# Patient Record
Sex: Female | Born: 1940
Health system: Southern US, Community
[De-identification: ages and names within clinical notes are randomized; demographics above are authoritative.]

## PROBLEM LIST (undated history)

## (undated) ENCOUNTER — Ambulatory Visit (HOSPITAL_BASED_OUTPATIENT_CLINIC_OR_DEPARTMENT_OTHER)

## (undated) DIAGNOSIS — E559 Vitamin D deficiency, unspecified: Secondary | ICD-10-CM

## (undated) DIAGNOSIS — G43909 Migraine, unspecified, not intractable, without status migrainosus: Secondary | ICD-10-CM

## (undated) DIAGNOSIS — I2699 Other pulmonary embolism without acute cor pulmonale: Secondary | ICD-10-CM

## (undated) DIAGNOSIS — I10 Essential (primary) hypertension: Secondary | ICD-10-CM

## (undated) DIAGNOSIS — D125 Benign neoplasm of sigmoid colon: Secondary | ICD-10-CM

## (undated) DIAGNOSIS — R0600 Dyspnea, unspecified: Secondary | ICD-10-CM

## (undated) DIAGNOSIS — R42 Dizziness and giddiness: Secondary | ICD-10-CM

## (undated) DIAGNOSIS — Z8711 Personal history of peptic ulcer disease: Secondary | ICD-10-CM

## (undated) DIAGNOSIS — H9190 Unspecified hearing loss, unspecified ear: Secondary | ICD-10-CM

## (undated) DIAGNOSIS — I499 Cardiac arrhythmia, unspecified: Secondary | ICD-10-CM

## (undated) DIAGNOSIS — R7303 Prediabetes: Secondary | ICD-10-CM

## (undated) DIAGNOSIS — M1990 Unspecified osteoarthritis, unspecified site: Secondary | ICD-10-CM

## (undated) DIAGNOSIS — D649 Anemia, unspecified: Secondary | ICD-10-CM

## (undated) DIAGNOSIS — J45909 Unspecified asthma, uncomplicated: Secondary | ICD-10-CM

## (undated) DIAGNOSIS — K2100 Gastro-esophageal reflux disease with esophagitis, without bleeding: Secondary | ICD-10-CM

## (undated) DIAGNOSIS — E782 Mixed hyperlipidemia: Secondary | ICD-10-CM

## (undated) DIAGNOSIS — E785 Hyperlipidemia, unspecified: Secondary | ICD-10-CM

## (undated) DIAGNOSIS — K219 Gastro-esophageal reflux disease without esophagitis: Secondary | ICD-10-CM

## (undated) DIAGNOSIS — I491 Atrial premature depolarization: Secondary | ICD-10-CM

## (undated) DIAGNOSIS — G4733 Obstructive sleep apnea (adult) (pediatric): Secondary | ICD-10-CM

## (undated) DIAGNOSIS — Z973 Presence of spectacles and contact lenses: Secondary | ICD-10-CM

## (undated) DIAGNOSIS — I2510 Atherosclerotic heart disease of native coronary artery without angina pectoris: Secondary | ICD-10-CM

## (undated) DIAGNOSIS — E119 Type 2 diabetes mellitus without complications: Secondary | ICD-10-CM

## (undated) DIAGNOSIS — R55 Syncope and collapse: Secondary | ICD-10-CM

## (undated) DIAGNOSIS — R21 Rash and other nonspecific skin eruption: Secondary | ICD-10-CM

## (undated) DIAGNOSIS — I219 Acute myocardial infarction, unspecified: Secondary | ICD-10-CM

## (undated) DIAGNOSIS — Z955 Presence of coronary angioplasty implant and graft: Secondary | ICD-10-CM

## (undated) DIAGNOSIS — I739 Peripheral vascular disease, unspecified: Secondary | ICD-10-CM

## (undated) DIAGNOSIS — B029 Zoster without complications: Secondary | ICD-10-CM

## (undated) HISTORY — DX: Prediabetes: R73.03

## (undated) HISTORY — DX: Mixed hyperlipidemia: E78.2

## (undated) HISTORY — DX: Dizziness and giddiness: R42

## (undated) HISTORY — DX: Benign neoplasm of sigmoid colon: D12.5

## (undated) HISTORY — DX: Vitamin D deficiency, unspecified: E55.9

## (undated) HISTORY — DX: Essential (primary) hypertension: I10

## (undated) HISTORY — DX: Migraine, unspecified, not intractable, without status migrainosus: G43.909

## (undated) HISTORY — PX: TUBAL LIGATION: SHX77

## (undated) HISTORY — DX: Gastro-esophageal reflux disease with esophagitis, without bleeding: K21.00

## (undated) HISTORY — DX: Hyperlipidemia, unspecified: E78.5

## (undated) HISTORY — PX: CEREBRAL ANEURYSM REPAIR: SHX164

## (undated) HISTORY — DX: Unspecified osteoarthritis, unspecified site: M19.90

## (undated) HISTORY — DX: Unspecified asthma, uncomplicated: J45.909

## (undated) HISTORY — DX: Presence of coronary angioplasty implant and graft: Z95.5

## (undated) HISTORY — PX: HX HYSTERECTOMY: SHX81

## (undated) HISTORY — DX: Atrial premature depolarization: I49.1

## (undated) HISTORY — PX: HX TONSILLECTOMY: SHX27

## (undated) HISTORY — DX: Atherosclerotic heart disease of native coronary artery without angina pectoris: I25.10

## (undated) HISTORY — DX: Obstructive sleep apnea (adult) (pediatric): G47.33

## (undated) HISTORY — PX: HAND SURGERY: SHX662

## (undated) HISTORY — DX: Peripheral vascular disease, unspecified: I73.9

## (undated) HISTORY — DX: Syncope and collapse: R55

## (undated) HISTORY — PX: SHOULDER SURGERY: SHX246

## (undated) HISTORY — PX: HX CORONARY STENT PLACEMENT: SHX49

## (undated) HISTORY — DX: Gastro-esophageal reflux disease without esophagitis: K21.9

---

## 1991-03-17 HISTORY — PX: FOOT SURGERY: SHX648

## 1998-05-16 ENCOUNTER — Ambulatory Visit: Admission: RE | Admit: 1998-05-16 | Discharge: 1998-05-16 | Payer: Self-pay | Admitting: Internal Medicine

## 1998-05-30 ENCOUNTER — Ambulatory Visit (HOSPITAL_COMMUNITY): Admission: RE | Admit: 1998-05-30 | Discharge: 1998-05-30 | Payer: Self-pay | Admitting: Cardiology

## 1999-04-25 ENCOUNTER — Encounter: Admission: RE | Admit: 1999-04-25 | Discharge: 1999-04-25 | Payer: Self-pay | Admitting: Otolaryngology

## 1999-04-25 ENCOUNTER — Encounter: Payer: Self-pay | Admitting: Otolaryngology

## 1999-11-14 ENCOUNTER — Encounter: Admission: RE | Admit: 1999-11-14 | Discharge: 1999-11-14 | Payer: Self-pay | Admitting: *Deleted

## 1999-11-14 ENCOUNTER — Other Ambulatory Visit: Admission: RE | Admit: 1999-11-14 | Discharge: 1999-11-14 | Payer: Self-pay | Admitting: *Deleted

## 1999-11-14 ENCOUNTER — Encounter: Payer: Self-pay | Admitting: *Deleted

## 2000-12-09 ENCOUNTER — Encounter: Payer: Self-pay | Admitting: *Deleted

## 2000-12-09 ENCOUNTER — Encounter: Admission: RE | Admit: 2000-12-09 | Discharge: 2000-12-09 | Payer: Self-pay | Admitting: *Deleted

## 2000-12-09 ENCOUNTER — Other Ambulatory Visit: Admission: RE | Admit: 2000-12-09 | Discharge: 2000-12-09 | Payer: Self-pay | Admitting: *Deleted

## 2001-12-29 ENCOUNTER — Encounter: Payer: Self-pay | Admitting: Internal Medicine

## 2001-12-29 ENCOUNTER — Encounter: Admission: RE | Admit: 2001-12-29 | Discharge: 2001-12-29 | Payer: Self-pay | Admitting: Internal Medicine

## 2001-12-29 ENCOUNTER — Other Ambulatory Visit: Admission: RE | Admit: 2001-12-29 | Discharge: 2001-12-29 | Payer: Self-pay | Admitting: Internal Medicine

## 2003-04-23 ENCOUNTER — Other Ambulatory Visit: Admission: RE | Admit: 2003-04-23 | Discharge: 2003-04-23 | Payer: Self-pay | Admitting: Internal Medicine

## 2003-05-11 ENCOUNTER — Ambulatory Visit (HOSPITAL_COMMUNITY): Admission: RE | Admit: 2003-05-11 | Discharge: 2003-05-11 | Payer: Self-pay | Admitting: Internal Medicine

## 2004-05-09 ENCOUNTER — Ambulatory Visit (HOSPITAL_COMMUNITY): Admission: RE | Admit: 2004-05-09 | Discharge: 2004-05-09 | Payer: Self-pay | Admitting: Internal Medicine

## 2005-05-19 ENCOUNTER — Other Ambulatory Visit: Admission: RE | Admit: 2005-05-19 | Discharge: 2005-05-19 | Payer: Self-pay | Admitting: Internal Medicine

## 2005-05-19 ENCOUNTER — Ambulatory Visit (HOSPITAL_COMMUNITY): Admission: RE | Admit: 2005-05-19 | Discharge: 2005-05-19 | Payer: Self-pay | Admitting: Internal Medicine

## 2006-05-26 ENCOUNTER — Ambulatory Visit (HOSPITAL_COMMUNITY): Admission: RE | Admit: 2006-05-26 | Discharge: 2006-05-26 | Payer: Self-pay | Admitting: Internal Medicine

## 2007-03-17 HISTORY — PX: CATARACT EXTRACTION W/ INTRAOCULAR LENS  IMPLANT, BILATERAL: SHX1307

## 2007-06-13 ENCOUNTER — Ambulatory Visit (HOSPITAL_COMMUNITY): Admission: RE | Admit: 2007-06-13 | Discharge: 2007-06-13 | Payer: Self-pay | Admitting: Internal Medicine

## 2007-06-13 ENCOUNTER — Other Ambulatory Visit: Admission: RE | Admit: 2007-06-13 | Discharge: 2007-06-13 | Payer: Self-pay | Admitting: Internal Medicine

## 2007-06-13 LAB — HM PAP SMEAR: HM PAP: NORMAL

## 2007-06-24 ENCOUNTER — Encounter: Admission: RE | Admit: 2007-06-24 | Discharge: 2007-06-24 | Payer: Self-pay | Admitting: Internal Medicine

## 2008-06-27 ENCOUNTER — Ambulatory Visit (HOSPITAL_COMMUNITY): Admission: RE | Admit: 2008-06-27 | Discharge: 2008-06-27 | Payer: Self-pay | Admitting: Internal Medicine

## 2008-07-04 ENCOUNTER — Encounter: Admission: RE | Admit: 2008-07-04 | Discharge: 2008-07-04 | Payer: Self-pay | Admitting: Internal Medicine

## 2009-07-08 ENCOUNTER — Ambulatory Visit (HOSPITAL_COMMUNITY): Admission: RE | Admit: 2009-07-08 | Discharge: 2009-07-08 | Payer: Self-pay | Admitting: Internal Medicine

## 2009-07-17 ENCOUNTER — Ambulatory Visit (HOSPITAL_COMMUNITY): Admission: RE | Admit: 2009-07-17 | Discharge: 2009-07-17 | Payer: Self-pay | Admitting: Internal Medicine

## 2009-07-17 LAB — HM DEXA SCAN: HM Dexa Scan: NORMAL

## 2010-04-06 ENCOUNTER — Encounter: Payer: Self-pay | Admitting: Internal Medicine

## 2010-06-05 ENCOUNTER — Other Ambulatory Visit (HOSPITAL_COMMUNITY): Payer: Self-pay | Admitting: Internal Medicine

## 2010-06-05 DIAGNOSIS — Z1231 Encounter for screening mammogram for malignant neoplasm of breast: Secondary | ICD-10-CM

## 2010-07-16 ENCOUNTER — Ambulatory Visit (HOSPITAL_COMMUNITY)
Admission: RE | Admit: 2010-07-16 | Discharge: 2010-07-16 | Disposition: A | Discharge status: Home / Self Care | Payer: MEDICARE | Source: Ambulatory Visit | Attending: Internal Medicine | Admitting: Internal Medicine

## 2010-07-16 DIAGNOSIS — Z1231 Encounter for screening mammogram for malignant neoplasm of breast: Secondary | ICD-10-CM | POA: Insufficient documentation

## 2010-08-15 ENCOUNTER — Emergency Department (HOSPITAL_COMMUNITY): Payer: Self-pay | Admitting: Physician Assistant

## 2010-09-29 ENCOUNTER — Ambulatory Visit
Admission: RE | Admit: 2010-09-29 | Discharge: 2010-09-29 | Disposition: A | Discharge status: Home / Self Care | Payer: Medicare Other | Source: Ambulatory Visit | Attending: Dermatology | Admitting: Dermatology

## 2010-09-29 ENCOUNTER — Other Ambulatory Visit: Payer: Self-pay | Admitting: Dermatology

## 2010-09-29 DIAGNOSIS — T63301A Toxic effect of unspecified spider venom, accidental (unintentional), initial encounter: Secondary | ICD-10-CM

## 2010-09-29 DIAGNOSIS — T6391XA Toxic effect of contact with unspecified venomous animal, accidental (unintentional), initial encounter: Secondary | ICD-10-CM

## 2011-07-24 ENCOUNTER — Other Ambulatory Visit (HOSPITAL_COMMUNITY): Payer: Self-pay | Admitting: Internal Medicine

## 2011-07-24 DIAGNOSIS — Z1231 Encounter for screening mammogram for malignant neoplasm of breast: Secondary | ICD-10-CM

## 2011-08-19 ENCOUNTER — Ambulatory Visit (HOSPITAL_COMMUNITY): Payer: Medicare Other

## 2011-09-10 ENCOUNTER — Ambulatory Visit (HOSPITAL_COMMUNITY)
Admission: RE | Admit: 2011-09-10 | Discharge: 2011-09-10 | Disposition: A | Discharge status: Home / Self Care | Payer: Medicare Other | Source: Ambulatory Visit | Attending: Internal Medicine | Admitting: Internal Medicine

## 2011-09-10 DIAGNOSIS — Z1231 Encounter for screening mammogram for malignant neoplasm of breast: Secondary | ICD-10-CM

## 2011-12-15 DIAGNOSIS — I6529 Occlusion and stenosis of unspecified carotid artery: Secondary | ICD-10-CM

## 2011-12-29 ENCOUNTER — Encounter: Payer: Self-pay | Admitting: Internal Medicine

## 2012-01-27 LAB — HM COLONOSCOPY

## 2012-07-11 ENCOUNTER — Encounter: Payer: Self-pay | Admitting: Internal Medicine

## 2012-09-14 ENCOUNTER — Other Ambulatory Visit (HOSPITAL_COMMUNITY): Payer: Self-pay | Admitting: Internal Medicine

## 2012-09-14 DIAGNOSIS — Z1231 Encounter for screening mammogram for malignant neoplasm of breast: Secondary | ICD-10-CM

## 2012-09-15 ENCOUNTER — Ambulatory Visit (HOSPITAL_COMMUNITY)
Admission: RE | Admit: 2012-09-15 | Discharge: 2012-09-15 | Disposition: A | Discharge status: Home / Self Care | Payer: Medicare Other | Source: Ambulatory Visit | Attending: Internal Medicine | Admitting: Internal Medicine

## 2012-09-15 DIAGNOSIS — Z1231 Encounter for screening mammogram for malignant neoplasm of breast: Secondary | ICD-10-CM

## 2012-09-15 LAB — HM MAMMOGRAPHY: HM MAMMO: NORMAL

## 2013-03-20 DIAGNOSIS — R7309 Other abnormal glucose: Secondary | ICD-10-CM | POA: Insufficient documentation

## 2013-03-20 DIAGNOSIS — J45909 Unspecified asthma, uncomplicated: Secondary | ICD-10-CM | POA: Insufficient documentation

## 2013-03-20 DIAGNOSIS — G43909 Migraine, unspecified, not intractable, without status migrainosus: Secondary | ICD-10-CM | POA: Insufficient documentation

## 2013-03-20 DIAGNOSIS — I10 Essential (primary) hypertension: Secondary | ICD-10-CM | POA: Insufficient documentation

## 2013-03-20 DIAGNOSIS — E559 Vitamin D deficiency, unspecified: Secondary | ICD-10-CM | POA: Insufficient documentation

## 2013-03-21 ENCOUNTER — Encounter: Payer: Self-pay | Admitting: Physician Assistant

## 2013-03-21 ENCOUNTER — Ambulatory Visit (INDEPENDENT_AMBULATORY_CARE_PROVIDER_SITE_OTHER): Payer: Medicare HMO | Admitting: Physician Assistant

## 2013-03-21 VITALS — BP 138/62 | HR 80 | Temp 98.1°F | Resp 16 | Ht 64.0 in | Wt 185.0 lb

## 2013-03-21 DIAGNOSIS — E785 Hyperlipidemia, unspecified: Secondary | ICD-10-CM

## 2013-03-21 DIAGNOSIS — E782 Mixed hyperlipidemia: Secondary | ICD-10-CM | POA: Insufficient documentation

## 2013-03-21 DIAGNOSIS — R7309 Other abnormal glucose: Secondary | ICD-10-CM

## 2013-03-21 DIAGNOSIS — Z79899 Other long term (current) drug therapy: Secondary | ICD-10-CM

## 2013-03-21 DIAGNOSIS — E559 Vitamin D deficiency, unspecified: Secondary | ICD-10-CM

## 2013-03-21 DIAGNOSIS — I10 Essential (primary) hypertension: Secondary | ICD-10-CM

## 2013-03-21 DIAGNOSIS — R7303 Prediabetes: Secondary | ICD-10-CM

## 2013-03-21 LAB — CBC WITH DIFFERENTIAL/PLATELET
BASOS ABS: 0 10*3/uL (ref 0.0–0.1)
BASOS PCT: 0 % (ref 0–1)
EOS PCT: 1 % (ref 0–5)
Eosinophils Absolute: 0 10*3/uL (ref 0.0–0.7)
HEMATOCRIT: 43.1 % (ref 36.0–46.0)
Hemoglobin: 14.6 g/dL (ref 12.0–15.0)
LYMPHS ABS: 1.3 10*3/uL (ref 0.7–4.0)
Lymphocytes Relative: 26 % (ref 12–46)
MCH: 30.6 pg (ref 26.0–34.0)
MCHC: 33.9 g/dL (ref 30.0–36.0)
MCV: 90.4 fL (ref 78.0–100.0)
MONO ABS: 0.3 10*3/uL (ref 0.1–1.0)
Monocytes Relative: 7 % (ref 3–12)
NEUTROS ABS: 3.2 10*3/uL (ref 1.7–7.7)
Neutrophils Relative %: 66 % (ref 43–77)
Platelets: 199 10*3/uL (ref 150–400)
RBC: 4.77 MIL/uL (ref 3.87–5.11)
RDW: 14.6 % (ref 11.5–15.5)
WBC: 4.9 10*3/uL (ref 4.0–10.5)

## 2013-03-21 LAB — MAGNESIUM: MAGNESIUM: 2.1 mg/dL (ref 1.5–2.5)

## 2013-03-21 LAB — HEPATIC FUNCTION PANEL
ALBUMIN: 4.2 g/dL (ref 3.5–5.2)
ALK PHOS: 93 U/L (ref 39–117)
ALT: 12 U/L (ref 0–35)
AST: 16 U/L (ref 0–37)
Bilirubin, Direct: 0.1 mg/dL (ref 0.0–0.3)
Indirect Bilirubin: 0.3 mg/dL (ref 0.0–0.9)
TOTAL PROTEIN: 6.2 g/dL (ref 6.0–8.3)
Total Bilirubin: 0.4 mg/dL (ref 0.3–1.2)

## 2013-03-21 LAB — BASIC METABOLIC PANEL WITH GFR
BUN: 17 mg/dL (ref 6–23)
CHLORIDE: 107 meq/L (ref 96–112)
CO2: 30 meq/L (ref 19–32)
Calcium: 9.1 mg/dL (ref 8.4–10.5)
Creat: 0.68 mg/dL (ref 0.50–1.10)
GFR, Est Non African American: 89 mL/min
GLUCOSE: 114 mg/dL — AB (ref 70–99)
POTASSIUM: 4.4 meq/L (ref 3.5–5.3)
Sodium: 144 mEq/L (ref 135–145)

## 2013-03-21 LAB — LIPID PANEL
Cholesterol: 135 mg/dL (ref 0–200)
HDL: 42 mg/dL (ref >39)
LDL CALC: 72 mg/dL (ref 0–99)
TRIGLYCERIDES: 104 mg/dL (ref <150)
Total CHOL/HDL Ratio: 3.2 Ratio
VLDL: 21 mg/dL (ref 0–40)

## 2013-03-21 LAB — HEMOGLOBIN A1C
Hgb A1c MFr Bld: 5.4 % (ref <5.7)
Mean Plasma Glucose: 108 mg/dL (ref <117)

## 2013-03-21 NOTE — Progress Notes (Signed)
HPI Patient presents for 3 month follow up with hypertension, hyperlipidemia, prediabetes and vitamin D. Patient's blood pressure has been controlled at home, today their BP is BP: 138/62 mmHg  Patient denies chest pain, shortness of breath, dizziness. She is off HCTZ.  Patient's cholesterol is diet controlled. The cholesterol last visit was LDL 81, trigs 105, HDL 47.  The patient has been working on diet and exercise for prediabetes, and denies changes in vision, polys, and paresthesias. A1C 5.6.  Last GFR was 59 with BUN 18 and Cr 0.98.  Patient is on Vitamin D supplement.  Vitamin D 37 and she restarted 8000 IU day but has been out for one week.  Patient states she does not celebrate holidays since she is by herself now with only her dogs. 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  She has had cold symptoms for 2 weeks but she is feeling better.  Current Medications:  Current Outpatient Prescriptions on File Prior to Visit  Medication Sig Dispense Refill  . Cholecalciferol (VITAMIN D PO) Take 4,000 mg by mouth daily.      . hydrochlorothiazide (HYDRODIURIL) 25 MG tablet Take 25 mg by mouth daily. As needed      . Misc Natural Products (LUTEIN 20) CAPS Take 20 mg by mouth daily.      . Omega-3 Fatty Acids (FISH OIL PO) Take by mouth daily.       No current facility-administered medications on file prior to visit.   Medical History:  Past Medical History  Diagnosis Date  . Prediabetes   . Vitamin D deficiency   . Arthritis   . Asthma   . Hypertension   . Vertigo   . Migraines   . ASCVD (arteriosclerotic cardiovascular disease)    Allergies: Allergies no known allergies  ROS Constitutional: Denies fever, chills, headaches, insomnia, fatigue, night sweats Eyes: Denies redness, blurred vision, diplopia, discharge, itchy, watery eyes.  ENT:  + post nasal drip, congestion sore throat, earache, dental pain, Tinnitus, Vertigo, Sinus pain, snoring.   Cardio: Denies chest pain, palpitations, irregular heartbeat, dyspnea, diaphoresis, orthopnea, PND, claudication, edema Respiratory: + cough denies shortness of breath, wheezing.  Gastrointestinal: Denies dysphagia, heartburn, AB pain/ cramps, N/V, diarrhea, constipation, hematemesis, melena, hematochezia,  hemorrhoids Genitourinary: Denies dysuria, frequency, urgency, nocturia, hesitancy, discharge, hematuria, flank pain Musculoskeletal: Occ. Right leg pain at night only has improved. Denies myalgia, stiffness, pain, swelling and strain/sprain. Skin: Denies pruritis, rash, changing in skin lesion Neuro: Denies Weakness, tremor, incoordination, spasms, pain Psychiatric: Denies confusion, memory loss, sensory loss Endocrine: Denies change in weight, skin, hair change, nocturia Diabetic Polys, Denies visual blurring, hyper /hypo glycemic episodes, and paresthesia, Heme/Lymph: Denies Excessive bleeding, bruising, enlarged lymph nodes  Family history- Review and unchanged Social history- Review and unchanged Physical Exam: Filed Vitals:   03/21/13 1110  BP: 138/62  Pulse: 80  Temp: 98.1 F (36.7 C)  Resp: 16   Filed Weights   03/21/13 1110  Weight: 185 lb (83.915 kg)   General Appearance: Well nourished, in no apparent distress. Eyes: PERRLA, EOMs, conjunctiva no swelling or erythema Sinuses: No Frontal/maxillary tenderness ENT/Mouth: Ext aud canals clear, TMs without erythema, bulging. No erythema, swelling, or exudate on post pharynx.  Tonsils not swollen or erythematous. Hearing normal.  Neck: Supple, thyroid normal.  Respiratory: Respiratory effort normal, BS equal bilaterally without rales, rhonchi, wheezing or stridor.  Cardio: RRR with no MRGs. Brisk peripheral pulses without edema.  Abdomen: Soft, +  BS.  Non tender, no guarding, rebound, hernias, masses. Lymphatics: Non tender without lymphadenopathy.  Musculoskeletal: Full ROM, 5/5 strength, normal gait.  Skin: Warm, dry  without rashes, lesions, ecchymosis.  Neuro: Cranial nerves intact. Normal muscle tone, no cerebellar symptoms. Sensation intact.  Psych: Awake and oriented X 3, normal affect, Insight and Judgment appropriate.   Assessment and Plan:  Hypertension: Continue medication, monitor blood pressure at home.  Continue DASH diet. Cholesterol: Continue diet and exercise. Check cholesterol.  Pre-diabetes-Continue diet and exercise. Check A1C Vitamin D Def- check level and continue medications.   Continue diet and meds as discussed. Further disposition pending results of labs.  Vicie Mutters 11:20 AM

## 2013-03-21 NOTE — Patient Instructions (Signed)
Bad carbs also include fruit juice, alcohol, and sweet tea. These are empty calories that do not signal to your brain that you are full.   Please remember the good carbs are still carbs which convert into sugar. So please measure them out no more than 1/2-1 cup of rice, oatmeal, pasta, and beans.  Veggies are however free foods! Pile them on.   I like lean protein at every meal such as chicken, [place], pork chops, cottage cheese, etc. Just do not fry these meats and please center your meal around vegetable, the meats should be a side dish.   No all fruit is created equal. Please see the list below, the fruit at the bottom is higher in sugars than the fruit at the top   The majority of colds are caused by viruses and do not require antibiotics. Please read the rest of this hand out to learn more about the common cold and what you can do to help yourself as well as help prevent the over use of antibiotics.   COMMON COLD SIGNS AND SYMPTOMS - The common cold usually causes nasal congestion, runny nose, and sneezing. A sore throat may be present on the first day but usually resolves quickly. If a cough occurs, it generally develops on about the fourth or fifth day of symptoms, typically when congestion and runny nose are resolving  COMMON COLD COMPLICATIONS - In most cases, colds do not cause serious illness or complications. Most colds last for three to seven days, although many people continue to have symptoms (coughing, sneezing, congestion) for up to two weeks.  One of the more common complications is sinusitis, which is usually caused by viruses and rarely (about 2 percent of the time) by bacteria. Having thick or yellow to green-colored nasal discharge does not mean that bacterial sinusitis has developed; discolored nasal discharge is a normal phase of the common cold.  Lower respiratory infections, such as pneumonia or bronchitis, may develop following a cold.  Infection of the middle ear, or  otitis media, can accompany or follow a cold.  COMMON COLD TREATMENT - There is no specific treatment for the viruses that cause the common cold. Most treatments are aimed at relieving some of the symptoms of the cold, but do not shorten or cure the cold. Antibiotics are not useful for treating the common cold; antibiotics are only used to treat illnesses caused by bacteria, not viruses. Unnecessary use of antibiotics for the treatment of the common cold can cause allergic reactions, diarrhea, or other gastrointestinal symptoms in some patients.  The symptoms of a cold will resolve over time, even without any treatment. People with underlying medical conditions and those who use other over-the-counter or prescription medications should speak with their healthcare provider or pharmacist to ensure that it is safe to use these treatments. The following are treatments that may reduce the symptoms caused by the common cold.  Nasal congestion - Decongestants are good for nasal congestion- if you feel very stuffy but no mucus is coming out, this is the medication that will help you the most.  Pseudoephedrine is a decongestant that can improve nasal congestion. Although a prescription is not required, drugstores in the [place] keep pseudoephedrine behind the counter, so it must be requested from a pharmacist. If you have a heart condition or high blood pressure please use Coricidin BPH instead.   Runny nose - Antihistamines such as diphenhydramine (Benadryl), certazine (Zyrtec) which are best taking at night because they  can make you tired OR loratadine (Claritin),  fexafinadine (Allegra) help with a runny nose.   Nasal sprays such an oxymetazoline (Afrin and others) may also give temporary relief of nasal congestion. However, these sprays should never be used for more than two to three days; use for more than three days use can worsen congestion.  Nasocort is now over the counter and can help decrease a  runny nose. Please stop the medication if you have blurry vision or nose bleeds.   Sore throat and headache - Sore throat and headache are best treated with a mild pain reliever such as acetaminophen (Tylenol) or a non-steroidal anti-inflammatory agent such as ibuprofen or naproxen (Motrin or Aleve). These medications should be taken with food to prevent stomach problems. As well as gargling with warm water and salt.   Cough - Common cough medicine ingredients include guaifenesin and dextromethorphan; these are often combined with other medications in over-the-counter cold formulas. Often a cough is worse at night or first in the morning due to post nasal drip from you nose. You can try to sleep at an angle to decrease a cough.   Alternative treatments - Heated, humidified air can improve symptoms of nasal congestion and runny nose, and causes few to no side effects. A number of alternative products, including vitamin C, doubling up on your vitamin D and herbal products such as echinacea, may help. Certain products, such as nasal gels that contain zinc (eg, Zicam), have been associated with a permanent loss of smell.  Antibiotics - Antibiotics should not be used to treat an uncomplicated common cold. As noted above, colds are caused by viruses. Antibiotics treat bacterial, not viral infections. Some viruses that cause the common cold can also depress the immune system or cause swelling in the lining of the nose or airways; this can, in turn, lead to a bacterial infection. Often you need to give your body 7 days to fight off a common cold while treating the symptoms with the medications listed above. If after 7 days your symptoms are not improving, you are getting worse, you have shortness of breath, chest pain, a fever of over 103 you should seek medical help immediately.   PREVENTION IS THE BEST MEDICINE - Hand washing is an essential and highly effective way to prevent the spread of infection.    Alcohol-based hand rubs are a good alternative for disinfecting hands if a sink is not available.  Hands should be washed before preparing food and eating and after coughing, blowing the nose, or sneezing. While it is not always possible to limit contact with people who may be infected with a cold, touching the eyes, nose, or mouth after direct contact should be avoided when possible. Sneezing/coughing into the sleeve of one's clothing (at the inner elbow) is another means of containing sprays of saliva and secretions and does not contaminate the hands.

## 2013-03-22 LAB — TSH: TSH: 2.599 u[IU]/mL (ref 0.350–4.500)

## 2013-04-10 ENCOUNTER — Other Ambulatory Visit: Payer: Self-pay | Admitting: Physician Assistant

## 2013-04-10 MED ORDER — DIAZEPAM 5 MG PO TABS: 5.0000 mg | ORAL_TABLET | Freq: Three times a day (TID) | ORAL | Status: DC

## 2013-09-26 ENCOUNTER — Encounter: Payer: Self-pay | Admitting: Internal Medicine

## 2013-11-18 DIAGNOSIS — Z79899 Other long term (current) drug therapy: Secondary | ICD-10-CM

## 2013-11-18 HISTORY — DX: Other long term (current) drug therapy: Z79.899

## 2013-11-18 NOTE — Progress Notes (Signed)
Patient ID: Lori [name], female   DOB: [birth year], 73 y.o.   MRN: [id number]   R E S C H E D U L E   For  I N S U R A N C E   C H A N G E

## 2013-11-21 ENCOUNTER — Other Ambulatory Visit: Payer: Self-pay | Admitting: Internal Medicine

## 2013-11-21 ENCOUNTER — Other Ambulatory Visit: Payer: Self-pay | Admitting: *Deleted

## 2013-11-21 ENCOUNTER — Encounter: Payer: PPO | Admitting: Internal Medicine

## 2013-11-21 MED ORDER — DIAZEPAM 5 MG PO TABS: ORAL_TABLET | ORAL | Status: DC

## 2014-01-24 ENCOUNTER — Encounter: Payer: Self-pay | Admitting: Internal Medicine

## 2014-01-24 ENCOUNTER — Ambulatory Visit (INDEPENDENT_AMBULATORY_CARE_PROVIDER_SITE_OTHER): Payer: Commercial Managed Care - HMO | Admitting: Internal Medicine

## 2014-01-24 VITALS — BP 130/76 | HR 76 | Temp 98.2°F | Resp 16 | Ht 64.5 in | Wt 184.4 lb

## 2014-01-24 DIAGNOSIS — R7303 Prediabetes: Secondary | ICD-10-CM

## 2014-01-24 DIAGNOSIS — R6889 Other general symptoms and signs: Secondary | ICD-10-CM

## 2014-01-24 DIAGNOSIS — Z1212 Encounter for screening for malignant neoplasm of rectum: Secondary | ICD-10-CM

## 2014-01-24 DIAGNOSIS — I10 Essential (primary) hypertension: Secondary | ICD-10-CM

## 2014-01-24 DIAGNOSIS — Z23 Encounter for immunization: Secondary | ICD-10-CM

## 2014-01-24 DIAGNOSIS — Z0001 Encounter for general adult medical examination with abnormal findings: Secondary | ICD-10-CM

## 2014-01-24 DIAGNOSIS — Z79899 Other long term (current) drug therapy: Secondary | ICD-10-CM

## 2014-01-24 DIAGNOSIS — I2510 Atherosclerotic heart disease of native coronary artery without angina pectoris: Secondary | ICD-10-CM

## 2014-01-24 DIAGNOSIS — Z9181 History of falling: Secondary | ICD-10-CM

## 2014-01-24 DIAGNOSIS — E559 Vitamin D deficiency, unspecified: Secondary | ICD-10-CM

## 2014-01-24 DIAGNOSIS — Z1331 Encounter for screening for depression: Secondary | ICD-10-CM

## 2014-01-24 DIAGNOSIS — E785 Hyperlipidemia, unspecified: Secondary | ICD-10-CM

## 2014-01-24 LAB — CBC WITH DIFFERENTIAL/PLATELET
BASOS PCT: 1 % (ref 0–1)
Basophils Absolute: 0 10*3/uL (ref 0.0–0.1)
Eosinophils Absolute: 0.1 10*3/uL (ref 0.0–0.7)
Eosinophils Relative: 2 % (ref 0–5)
HEMATOCRIT: 43.7 % (ref 36.0–46.0)
HEMOGLOBIN: 14.7 g/dL (ref 12.0–15.0)
LYMPHS ABS: 1.4 10*3/uL (ref 0.7–4.0)
Lymphocytes Relative: 33 % (ref 12–46)
MCH: 29.9 pg (ref 26.0–34.0)
MCHC: 33.6 g/dL (ref 30.0–36.0)
MCV: 89 fL (ref 78.0–100.0)
MONO ABS: 0.5 10*3/uL (ref 0.1–1.0)
MONOS PCT: 11 % (ref 3–12)
NEUTROS PCT: 53 % (ref 43–77)
Neutro Abs: 2.3 10*3/uL (ref 1.7–7.7)
Platelets: 214 10*3/uL (ref 150–400)
RBC: 4.91 MIL/uL (ref 3.87–5.11)
RDW: 15 % (ref 11.5–15.5)
WBC: 4.3 10*3/uL (ref 4.0–10.5)

## 2014-01-24 LAB — HEMOGLOBIN A1C
HEMOGLOBIN A1C: 5.8 % — AB (ref <5.7)
MEAN PLASMA GLUCOSE: 120 mg/dL — AB (ref <117)

## 2014-01-24 NOTE — Progress Notes (Signed)
Patient ID: Lori [name], female   DOB: [birth year], 73 y.o.   MRN: [id number]  MEDICARE ANNUAL WELLNESS  & PREVENTATIVE VISIT AND CPE  Assessment:   1. Encounter for general adult medical examination with abnormal findings   2. Essential hypertension  - Microalbumin / creatinine urine ratio - EKG 12-Lead - [place], RETROPERITNL ABD,  LTD - TSH  3. Hyperlipidemia  - Lipid panel  4. Prediabetes  - Hemoglobin A1c - Insulin, fasting  5. Vitamin D deficiency  - Vit D  25 hydroxy (rtn osteoporosis monitoring)  6. ASCVD (arteriosclerotic cardiovascular disease)  - EKG 12-Lead - [place], RETROPERITNL ABD,  LTD  7. Screening for malignant neoplasm of the rectum  - POC Hemoccult Bld/Stl (3-Cd Home Screen); Future  8. Medication management  - Urine Microscopic - CBC with Differential - BASIC METABOLIC PANEL WITH GFR - Hepatic function panel - Magnesium  9. At low risk for fall   10. Depression screen  - Negative  11. Need for prophylactic vaccination with tetanus-diphtheria (TD)  - DT Vaccine    Plan:   During the course of the visit the patient was educated and counseled about appropriate screening and preventive services including:    Pneumococcal vaccine   Influenza vaccine  Td vaccine  Screening electrocardiogram  Bone densitometry screening  Colorectal cancer screening  Diabetes screening  Glaucoma screening  Nutrition counseling   Advanced directives: requested  Screening recommendations, referrals: Vaccinations: DTvaccine 01/24/2014 Influenza vaccine declined Pneumococcal vaccine 10/06/11 Prevnar vaccine undecided Shingles vaccine declined Hep B vaccine not indicated  Nutrition assessed and recommended  Colonoscopy 2003 & patient refused 2013 recommended colonoscopy Recommended yearly ophthalmology/optometry visit for glaucoma screening and checkup Recommended yearly dental visit for hygiene and checkup Advanced directives - no -  recommended forms  Conditions/risks identified: BMI: Discussed weight loss, diet, and increase physical activity.  Increase physical activity: AHA recommends 150 minutes of physical activity a week.  Medications reviewed PreDiabetes is at goal, ACE/ARB therapy: No, Reason not on Ace Inhibitor/ARB therapy:  not indicated Urinary Incontinence is not an issue: discussed non pharmacology and pharmacology options.  Fall risk: low- discussed PT, home fall assessment, medications.   Subjective:   Lori [name] is a 73 y.o. female who presents for Medicare Annual Wellness Visit and complete physical.  Date of last medicare wellness visit is unknown.  She has had elevated blood pressure since 2009. Her blood pressure has been controlled at home, & today their BP is BP: 130/76 mmHg. In 1989 she had a negative Heart Cath after a FALSE POSITIVE Cardiolyte. In 2001 she had surgery at [facility] fo a cerebral aneurysm.  She does workout. She denies chest pain, shortness of breath, dizziness.  She is not on cholesterol medication and denies myalgias. Her cholesterol is at goal. The cholesterol last visit was:  Lab Results  Component Value Date   CHOL 135 03/21/2013   HDL 42 03/21/2013   LDLCALC 72 03/21/2013   TRIG 104 03/21/2013   CHOLHDL 3.2 03/21/2013    She has had Prediabetes for 2 years (2013 with A1c of 5.8%). She has been working on diet and exercise for prediabetes, and denies foot ulcerations, hyperglycemia, increased appetite, nausea, paresthesia of the feet, polydipsia, polyuria and visual disturbances. Last A1C in the office was:  Lab Results  Component Value Date   HGBA1C 5.4 03/21/2013   Patient is on Vitamin D supplement.   No results found for: VD25OH    Names of Other Physician/Practitioners you  currently use: 1. [facility] here for primary care 2. Dr Katy Fitch, eye doctor, last visit 2014 3. Dr Andree Elk, dentist, last visit 2014  Patient Care  Team: Unk Pinto, MD as PCP - General (Internal Medicine) Lafayette Dragon, MD as Consulting Physician (Gastroenterology) Clent Jacks, MD as Consulting Physician (Ophthalmology)  Medication Review: Current Outpatient Prescriptions on File Prior to Visit  Medication Sig Dispense Refill  . Ascorbic Acid (VITAMIN C PO) Take by mouth daily.    . Cholecalciferol (VITAMIN D PO) Take 4,000 mg by mouth daily.    . diazepam (VALIUM) 5 MG tablet Take 1/2 to 1 tab tid prn for anxiety. 90 tablet 0  . hydrochlorothiazide (HYDRODIURIL) 25 MG tablet Take 25 mg by mouth daily. As needed    . Misc Natural Products (LUTEIN 20) CAPS Take 20 mg by mouth daily.    . Omega-3 Fatty Acids (FISH OIL PO) Take by mouth daily.     No current facility-administered medications on file prior to visit.    Current Problems (verified) Patient Active Problem List   Diagnosis Date Noted  . Medication management 11/18/2013  . Hyperlipidemia   . Prediabetes   . Vitamin D deficiency   . Arthritis   . Asthma   . Hypertension   . Vertigo   . Migraines   . ASCVD (arteriosclerotic cardiovascular disease)     Screening Tests Health Maintenance  Topic Date Due  . COLONOSCOPY  08/28/1992  . ZOSTAVAX  08/29/2002  . TETANUS/TDAP  04/22/2013  . INFLUENZA VACCINE  10/14/2013  . MAMMOGRAM  09/16/2014  . PNEUMOCOCCAL POLYSACCHARIDE VACCINE AGE 64 AND OVER  Completed    Immunization History  Administered Date(s) Administered  . DT 01/24/2014  . Pneumococcal Polysaccharide-23 10/06/2011  . Td 04/23/2003    Preventative care: Last colonoscopy: 2003 - refused f/u in 2013  Prior vaccinations: DT : 01/24/14  Influenza: refused  Pneumococcal: 10/06/2011 Prevnar: declined Shingles/Zostavax: declined  History reviewed: allergies, current medications, past family history, past medical history, past social history, past surgical history and problem list   Risk Factors: Tobacco History  Substance Use Topics  .  Smoking status: Never Smoker   . Smokeless tobacco: Never Used  . Alcohol Use: No   She does not smoke.  Patient is not a former smoker. Are there smokers in your home (other than you)?  No  Alcohol Current alcohol use: none  Caffeine Current caffeine use: coffee 1-2 cups /day  Exercise Current exercise: walking  Nutrition/Diet Current diet: in general, a "healthy" diet    Cardiac risk factors: advanced age (older than 16 for men, 22 for women), hypertension and sedentary lifestyle.  Depression Screen (Note: if answer to either of the following is "Yes", a more complete depression screening is indicated)   Q1: Over the past two weeks, have you felt down, depressed or hopeless? No  Q2: Over the past two weeks, have you felt little interest or pleasure in doing things? No  Have you lost interest or pleasure in daily life? No  Do you often feel hopeless? No  Do you cry easily over simple problems? No  Activities of Daily Living In your present state of health, do you have any difficulty performing the following activities?:  Driving? No Managing money?  No Feeding yourself? No Getting from bed to chair? No Climbing a flight of stairs? No Preparing food and eating?: No Bathing or showering? No Getting dressed: No Getting to the toilet? No  Using the toilet:No Moving around from place to place: No In the past year have you fallen or had a near fall?:No   Are you sexually active?  No  Do you have more than one partner?  No  Vision Difficulties: No  Hearing Difficulties: No Do you often ask people to speak up or repeat themselves? No Do you experience ringing or noises in your ears? No Do you have difficulty understanding soft or whispered voices? Sometimes.  Cognition  Do you feel that you have a problem with memory?No  Do you often misplace items? No  Do you feel safe at home?  Yes  Advanced directives Does patient have a [place]? No Does  patient have a Living Will? No - offered forms   Objective:     Blood pressure 130/76, pulse 76, temperature 98.2 F (36.8 C), resp. rate 16, height 5' 4.5" (1.638 m), weight 184 lb 6.4 oz (83.643 kg). Body mass index is 31.17 kg/(m^2).  General appearance: alert, no distress, WD/WN, female Cognitive Testing  Alert? Yes  Normal Appearance?Yes  Oriented to person? Yes  Place? Yes   Time? Yes  Recall of three objects?  Yes  Can perform simple calculations? Yes  Displays appropriate judgment? Yes  Can read the correct time from a watch/clock?Yes  HEENT: normocephalic, sclerae anicteric, TMs pearly, nares patent, no discharge or erythema, pharynx normal Oral cavity: MMM, no lesions Neck: supple, no lymphadenopathy, no thyromegaly, no masses Heart: RRR, normal S1, S2, no murmurs Lungs: CTA bilaterally, no wheezes, rhonchi, or rales Abdomen: +bs, soft, non tender, non distended, no masses, no hepatomegaly, no splenomegaly Musculoskeletal: nontender, no swelling, no obvious deformity Extremities: no edema, no cyanosis, no clubbing Pulses: 2+ symmetric, upper and lower extremities, normal cap refill Neurological: alert, oriented x 3, CN2-12 intact, strength normal upper extremities and lower extremities, sensation normal throughout, DTRs 2+ throughout, no cerebellar signs, gait normal Psychiatric: normal affect, behavior normal, pleasant   Medicare Attestation I have personally reviewed: The patient's medical and social history Their use of alcohol, tobacco or illicit drugs Their current medications and supplements The patient's functional ability including ADLs,fall risks, home safety risks, cognitive, and hearing and visual impairment Diet and physical activities Evidence for depression or mood disorders  The patient's weight, height, BMI, and visual acuity have been recorded in the chart.  I have made referrals, counseling, and provided education to the patient based on review of  the above and I have provided the patient with a written personalized care plan for preventive services.    Lori Adcox DAVID, MD   01/24/2014

## 2014-01-24 NOTE — Patient Instructions (Signed)
Recommend the book "The END of DIETING" by Dr Baker Janus   and the book "The END of DIABETES " by Dr Excell Seltzer  At [email or web address] - get book & Audio CD's      Being diabetic has a  300% increased risk for heart attack, stroke, cancer, and alzheimer- type vascular dementia. It is very important that you work harder with diet by avoiding all foods that are white except chicken & fish. Avoid white rice (brown & wild rice is OK), white potatoes (sweetpotatoes in moderation is OK), White bread or wheat bread or anything made out of white flour like bagels, donuts, rolls, buns, biscuits, cakes, pastries, cookies, pizza crust, and pasta (made from white flour & egg whites) - vegetarian pasta or spinach or wheat pasta is OK. Multigrain breads like Arnold's or [employer], or multigrain sandwich thins or flatbreads.  Diet, exercise and weight loss can reverse and cure diabetes in the early stages.  Diet, exercise and weight loss is very important in the control and prevention of complications of diabetes which affects every system in your body, ie. Brain - dementia/stroke, eyes - glaucoma/blindness, heart - heart attack/heart failure, kidneys - dialysis, stomach - gastric paralysis, intestines - malabsorption, nerves - severe painful neuritis, circulation - gangrene & loss of a leg(s), and finally cancer and Alzheimers.    I recommend avoid fried & greasy foods,  sweets/candy, white rice (brown or wild rice or Quinoa is OK), white potatoes (sweet potatoes are OK) - anything made from white flour - bagels, doughnuts, rolls, buns, biscuits,white and wheat breads, pizza crust and traditional pasta made of white flour & egg white(vegetarian pasta or spinach or wheat pasta is OK).  Multi-grain bread is OK - like multi-grain flat bread or sandwich thins. Avoid alcohol in excess. Exercise is also important.    Eat all the vegetables you want - avoid meat, especially red meat and dairy - especially cheese.  Cheese  is the most concentrated form of trans-fats which is the worst thing to clog up our arteries. Veggie cheese is OK which can be found in the fresh produce section at [employer] or [employer] or [employer]  Preventive Care for Adults A healthy lifestyle and preventive care can promote health and wellness. Preventive health guidelines for women include the following key practices.  A routine yearly physical is a good way to check with your health care provider about your health and preventive screening. It is a chance to share any concerns and updates on your health and to receive a thorough exam.  Visit your dentist for a routine exam and preventive care every 6 months. Brush your teeth twice a day and floss once a day. Good oral hygiene prevents tooth decay and gum disease.  The frequency of eye exams is based on your age, health, family medical history, use of contact lenses, and other factors. Follow your health care provider's recommendations for frequency of eye exams.  Eat a healthy diet. Foods like vegetables, fruits, whole grains, low-fat dairy products, and lean protein foods contain the nutrients you need without too many calories. Decrease your intake of foods high in solid fats, added sugars, and salt. Eat the right amount of calories for you.Get information about a proper diet from your health care provider, if necessary.  Regular physical exercise is one of the most important things you can do for your health. Most adults should get at least 150 minutes of moderate-intensity exercise (any activity that increases  your heart rate and causes you to sweat) each week. In addition, most adults need muscle-strengthening exercises on 2 or more days a week.  Maintain a healthy weight. The body mass index (BMI) is a screening tool to identify possible weight problems. It provides an estimate of body fat based on height and weight. Your health care provider can find your BMI and can help you  achieve or maintain a healthy weight.For adults 20 years and older:  A BMI below 18.5 is considered underweight.  A BMI of 18.5 to 24.9 is normal.  A BMI of 25 to 29.9 is considered overweight.  A BMI of 30 and above is considered obese.  Maintain normal blood lipids and cholesterol levels by exercising and minimizing your intake of saturated fat. Eat a balanced diet with plenty of fruit and vegetables. If your lipid or cholesterol levels are high, you are over 50, or you are at high risk for heart disease, you may need your cholesterol levels checked more frequently.Ongoing high lipid and cholesterol levels should be treated with medicines if diet and exercise are not working.  If you smoke, find out from your health care provider how to quit. If you do not use tobacco, do not start.  Lung cancer screening is recommended for adults aged 55-80 years who are at high risk for developing lung cancer because of a history of smoking. A yearly low-dose CT scan of the lungs is recommended for people who have at least a 30-pack-year history of smoking and are a current smoker or have quit within the past 15 years. A pack year of smoking is smoking an average of 1 pack of cigarettes a day for 1 year (for example: 1 pack a day for 30 years or 2 packs a day for 15 years). Yearly screening should continue until the smoker has stopped smoking for at least 15 years. Yearly screening should be stopped for people who develop a health problem that would prevent them from having lung cancer treatment.  Avoid use of street drugs. Do not share needles with anyone. Ask for help if you need support or instructions about stopping the use of drugs.  High blood pressure causes heart disease and increases the risk of stroke.  Ongoing high blood pressure should be treated with medicines if weight loss and exercise do not work.  If you are 55-79 years old, ask your health care provider if you should take aspirin to  prevent strokes.  Diabetes screening involves taking a blood sample to check your fasting blood sugar level. This should be done once every 3 years, after age 45, if you are within normal weight and without risk factors for diabetes. Testing should be considered at a younger age or be carried out more frequently if you are overweight and have at least 1 risk factor for diabetes.  Breast cancer screening is essential preventive care for women. You should practice "breast self-awareness." This means understanding the normal appearance and feel of your breasts and may include breast self-examination. Any changes detected, no matter how small, should be reported to a health care provider. Women in their 20s and 30s should have a clinical breast exam (CBE) by a health care provider as part of a regular health exam every 1 to 3 years. After age 40, women should have a CBE every year. Starting at age 40, women should consider having a mammogram (breast X-ray test) every year. Women who have a family history of breast cancer should   talk to their health care provider about genetic screening. Women at a high risk of breast cancer should talk to their health care providers about having an MRI and a mammogram every year.  Breast cancer gene (BRCA)-related cancer risk assessment is recommended for women who have family members with BRCA-related cancers. BRCA-related cancers include breast, ovarian, tubal, and peritoneal cancers. Having family members with these cancers may be associated with an increased risk for harmful changes (mutations) in the breast cancer genes BRCA1 and BRCA2. Results of the assessment will determine the need for genetic counseling and BRCA1 and BRCA2 testing.  Routine pelvic exams to screen for cancer are no longer recommended for nonpregnant women who are considered low risk for cancer of the pelvic organs (ovaries, uterus, and vagina) and who do not have symptoms. Ask your health care provider  if a screening pelvic exam is right for you.  If you have had past treatment for cervical cancer or a condition that could lead to cancer, you need Pap tests and screening for cancer for at least 20 years after your treatment. If Pap tests have been discontinued, your risk factors (such as having a new sexual partner) need to be reassessed to determine if screening should be resumed. Some women have medical problems that increase the chance of getting cervical cancer. In these cases, your health care provider may recommend more frequent screening and Pap tests.    Colorectal cancer can be detected and often prevented. Most routine colorectal cancer screening begins at the age of 90 years and continues through age 8 years. However, your health care provider may recommend screening at an earlier age if you have risk factors for colon cancer. On a yearly basis, your health care provider may provide home test kits to check for hidden blood in the stool. Use of a small camera at the end of a tube, to directly examine the colon (sigmoidoscopy or colonoscopy), can detect the earliest forms of colorectal cancer. Talk to your health care provider about this at age 86, when routine screening begins. Direct exam of the colon should be repeated every 5-10 years through age 32 years, unless early forms of pre-cancerous polyps or small growths are found.  Osteoporosis is a disease in which the bones lose minerals and strength with aging. This can result in serious bone fractures or breaks. The risk of osteoporosis can be identified using a bone density scan. Women ages 75 years and over and women at risk for fractures or osteoporosis should discuss screening with their health care providers. Ask your health care provider whether you should take a calcium supplement or vitamin D to reduce the rate of osteoporosis.  Menopause can be associated with physical symptoms and risks. Hormone replacement therapy is available to  decrease symptoms and risks. You should talk to your health care provider about whether hormone replacement therapy is right for you.  Use sunscreen. Apply sunscreen liberally and repeatedly throughout the day. You should seek shade when your shadow is shorter than you. Protect yourself by wearing long sleeves, pants, a wide-brimmed hat, and sunglasses year round, whenever you are outdoors.  Once a month, do a whole body skin exam, using a mirror to look at the skin on your back. Tell your health care provider of new moles, moles that have irregular borders, moles that are larger than a pencil eraser, or moles that have changed in shape or color.  Stay current with required vaccines (immunizations).  Influenza vaccine. All adults  should be immunized every year.  Tetanus, diphtheria, and acellular pertussis (Td, Tdap) vaccine. Pregnant women should receive 1 dose of Tdap vaccine during each pregnancy. The dose should be obtained regardless of the length of time since the last dose. Immunization is preferred during the 27th-36th week of gestation. An adult who has not previously received Tdap or who does not know her vaccine status should receive 1 dose of Tdap. This initial dose should be followed by tetanus and diphtheria toxoids (Td) booster doses every 10 years. Adults with an unknown or incomplete history of completing a 3-dose immunization series with Td-containing vaccines should begin or complete a primary immunization series including a Tdap dose. Adults should receive a Td booster every 10 years.    Zoster vaccine. One dose is recommended for adults aged 44 years or older unless certain conditions are present.    Pneumococcal 13-valent conjugate (PCV13) vaccine. When indicated, a person who is uncertain of her immunization history and has no record of immunization should receive the PCV13 vaccine. An adult aged 74 years or older who has certain medical conditions and has not been previously  immunized should receive 1 dose of PCV13 vaccine. This PCV13 should be followed with a dose of pneumococcal polysaccharide (PPSV23) vaccine. The PPSV23 vaccine dose should be obtained at least 8 weeks after the dose of PCV13 vaccine. An adult aged 66 years or older who has certain medical conditions and previously received 1 or more doses of PPSV23 vaccine should receive 1 dose of PCV13. The PCV13 vaccine dose should be obtained 1 or more years after the last PPSV23 vaccine dose.    Pneumococcal polysaccharide (PPSV23) vaccine. When PCV13 is also indicated, PCV13 should be obtained first. All adults aged 89 years and older should be immunized. An adult younger than age 80 years who has certain medical conditions should be immunized. Any person who resides in a nursing home or long-term care facility should be immunized. An adult smoker should be immunized. People with an immunocompromised condition and certain other conditions should receive both PCV13 and PPSV23 vaccines. People with human immunodeficiency virus (HIV) infection should be immunized as soon as possible after diagnosis. Immunization during chemotherapy or radiation therapy should be avoided. Routine use of PPSV23 vaccine is not recommended for American Indians, [place] Natives, or people younger than 65 years unless there are medical conditions that require PPSV23 vaccine. When indicated, people who have unknown immunization and have no record of immunization should receive PPSV23 vaccine. One-time revaccination 5 years after the first dose of PPSV23 is recommended for people aged 19-64 years who have chronic kidney failure, nephrotic syndrome, asplenia, or immunocompromised conditions. People who received 1-2 doses of PPSV23 before age 34 years should receive another dose of PPSV23 vaccine at age 31 years or later if at least 5 years have passed since the previous dose. Doses of PPSV23 are not needed for people immunized with PPSV23 at or after  age 29 years.   Preventive Services / Frequency  Ages 55 years and over  Blood pressure check.  Lipid and cholesterol check.  Lung cancer screening. / Every year if you are aged 29-80 years and have a 30-pack-year history of smoking and currently smoke or have quit within the past 15 years. Yearly screening is stopped once you have quit smoking for at least 15 years or develop a health problem that would prevent you from having lung cancer treatment.  Clinical breast exam.** / Every year after age 40 years.  BRCA-related cancer risk assessment.** / For women who have family members with a BRCA-related cancer (breast, ovarian, tubal, or peritoneal cancers).  Mammogram.** / Every year beginning at age 40 years and continuing for as long as you are in good health. Consult with your health care provider.  Pap test.** / Every 3 years starting at age 30 years through age 65 or 70 years with 3 consecutive normal Pap tests. Testing can be stopped between 65 and 70 years with 3 consecutive normal Pap tests and no abnormal Pap or HPV tests in the past 10 years.  Fecal occult blood test (FOBT) of stool. / Every year beginning at age 50 years and continuing until age 75 years. You may not need to do this test if you get a colonoscopy every 10 years.  Flexible sigmoidoscopy or colonoscopy.** / Every 5 years for a flexible sigmoidoscopy or every 10 years for a colonoscopy beginning at age 50 years and continuing until age 75 years.  Hepatitis C blood test.** / For all people born from 1945 through 1965 and any individual with known risks for hepatitis C.  Osteoporosis screening.** / A one-time screening for women ages 65 years and over and women at risk for fractures or osteoporosis.  Skin self-exam. / Monthly.  Influenza vaccine. / Every year.  Tetanus, diphtheria, and acellular pertussis (Tdap/Td) vaccine.** / 1 dose of Td every 10 years.  Zoster vaccine.** / 1 dose for adults aged 60 years  or older.  Pneumococcal 13-valent conjugate (PCV13) vaccine.** / Consult your health care provider.  Pneumococcal polysaccharide (PPSV23) vaccine.** / 1 dose for all adults aged 65 years and older. Screening for abdominal aortic aneurysm (AAA)  by ultrasound is recommended for people who have history of high blood pressure or who are current or former smokers. 

## 2014-01-25 LAB — BASIC METABOLIC PANEL WITH GFR
BUN: 17 mg/dL (ref 6–23)
CALCIUM: 9.2 mg/dL (ref 8.4–10.5)
CO2: 30 meq/L (ref 19–32)
Chloride: 104 mEq/L (ref 96–112)
Creat: 0.76 mg/dL (ref 0.50–1.10)
GFR, Est Non African American: 79 mL/min
GLUCOSE: 107 mg/dL — AB (ref 70–99)
Potassium: 4.1 mEq/L (ref 3.5–5.3)
SODIUM: 142 meq/L (ref 135–145)

## 2014-01-25 LAB — LIPID PANEL
CHOLESTEROL: 135 mg/dL (ref 0–200)
HDL: 61 mg/dL (ref >39)
LDL Cholesterol: 64 mg/dL (ref 0–99)
Total CHOL/HDL Ratio: 2.2 Ratio
Triglycerides: 51 mg/dL (ref <150)
VLDL: 10 mg/dL (ref 0–40)

## 2014-01-25 LAB — URINALYSIS, MICROSCOPIC ONLY
Bacteria, UA: NONE SEEN
CASTS: NONE SEEN
CRYSTALS: NONE SEEN
Squamous Epithelial / LPF: NONE SEEN

## 2014-01-25 LAB — MICROALBUMIN / CREATININE URINE RATIO
Creatinine, Urine: 117 mg/dL
Microalb Creat Ratio: 8.5 mg/g (ref 0.0–30.0)
Microalb, Ur: 1 mg/dL (ref <2.0)

## 2014-01-25 LAB — HEPATIC FUNCTION PANEL
ALT: 13 U/L (ref 0–35)
AST: 20 U/L (ref 0–37)
Albumin: 4.3 g/dL (ref 3.5–5.2)
Alkaline Phosphatase: 101 U/L (ref 39–117)
BILIRUBIN DIRECT: 0.1 mg/dL (ref 0.0–0.3)
BILIRUBIN INDIRECT: 0.4 mg/dL (ref 0.2–1.2)
TOTAL PROTEIN: 6.3 g/dL (ref 6.0–8.3)
Total Bilirubin: 0.5 mg/dL (ref 0.2–1.2)

## 2014-01-25 LAB — MAGNESIUM: Magnesium: 2 mg/dL (ref 1.5–2.5)

## 2014-01-25 LAB — TSH: TSH: 4.208 u[IU]/mL (ref 0.350–4.500)

## 2014-01-25 LAB — VITAMIN D 25 HYDROXY (VIT D DEFICIENCY, FRACTURES): Vit D, 25-Hydroxy: 40 ng/mL (ref 30–89)

## 2014-01-25 LAB — INSULIN, FASTING: Insulin fasting, serum: 4.4 u[IU]/mL (ref 2.0–19.6)

## 2014-03-07 ENCOUNTER — Ambulatory Visit: Payer: Self-pay

## 2014-03-07 ENCOUNTER — Ambulatory Visit: Payer: Self-pay | Admitting: Internal Medicine

## 2014-07-31 ENCOUNTER — Encounter: Payer: Self-pay | Admitting: Internal Medicine

## 2014-07-31 ENCOUNTER — Other Ambulatory Visit: Payer: Self-pay | Admitting: *Deleted

## 2014-07-31 ENCOUNTER — Ambulatory Visit (INDEPENDENT_AMBULATORY_CARE_PROVIDER_SITE_OTHER): Payer: PPO | Admitting: Internal Medicine

## 2014-07-31 VITALS — BP 124/76 | HR 64 | Temp 97.3°F | Resp 16 | Ht 64.5 in | Wt 174.0 lb

## 2014-07-31 DIAGNOSIS — E559 Vitamin D deficiency, unspecified: Secondary | ICD-10-CM

## 2014-07-31 DIAGNOSIS — E785 Hyperlipidemia, unspecified: Secondary | ICD-10-CM

## 2014-07-31 DIAGNOSIS — Z79899 Other long term (current) drug therapy: Secondary | ICD-10-CM

## 2014-07-31 DIAGNOSIS — R7303 Prediabetes: Secondary | ICD-10-CM

## 2014-07-31 DIAGNOSIS — R7309 Other abnormal glucose: Secondary | ICD-10-CM

## 2014-07-31 DIAGNOSIS — I10 Essential (primary) hypertension: Secondary | ICD-10-CM

## 2014-07-31 LAB — CBC WITH DIFFERENTIAL/PLATELET
BASOS PCT: 1 % (ref 0–1)
Basophils Absolute: 0 10*3/uL (ref 0.0–0.1)
EOS ABS: 0 10*3/uL (ref 0.0–0.7)
EOS PCT: 1 % (ref 0–5)
HCT: 40.9 % (ref 36.0–46.0)
HEMOGLOBIN: 13.7 g/dL (ref 12.0–15.0)
LYMPHS ABS: 1.4 10*3/uL (ref 0.7–4.0)
Lymphocytes Relative: 33 % (ref 12–46)
MCH: 30.2 pg (ref 26.0–34.0)
MCHC: 33.5 g/dL (ref 30.0–36.0)
MCV: 90.3 fL (ref 78.0–100.0)
MPV: 11.6 fL (ref 8.6–12.4)
Monocytes Absolute: 0.5 10*3/uL (ref 0.1–1.0)
Monocytes Relative: 11 % (ref 3–12)
NEUTROS PCT: 54 % (ref 43–77)
Neutro Abs: 2.3 10*3/uL (ref 1.7–7.7)
Platelets: 194 10*3/uL (ref 150–400)
RBC: 4.53 MIL/uL (ref 3.87–5.11)
RDW: 15 % (ref 11.5–15.5)
WBC: 4.3 10*3/uL (ref 4.0–10.5)

## 2014-07-31 LAB — HEPATIC FUNCTION PANEL
ALK PHOS: 83 U/L (ref 39–117)
ALT: 18 U/L (ref 0–35)
AST: 15 U/L (ref 0–37)
Albumin: 3.6 g/dL (ref 3.5–5.2)
BILIRUBIN DIRECT: 0.1 mg/dL (ref 0.0–0.3)
BILIRUBIN INDIRECT: 0.3 mg/dL (ref 0.2–1.2)
BILIRUBIN TOTAL: 0.4 mg/dL (ref 0.2–1.2)
Total Protein: 5.7 g/dL — ABNORMAL LOW (ref 6.0–8.3)

## 2014-07-31 LAB — BASIC METABOLIC PANEL WITH GFR
BUN: 20 mg/dL (ref 6–23)
CALCIUM: 8.6 mg/dL (ref 8.4–10.5)
CHLORIDE: 107 meq/L (ref 96–112)
CO2: 25 mEq/L (ref 19–32)
Creat: 0.69 mg/dL (ref 0.50–1.10)
GFR, Est African American: >89 mL/min
GFR, Est Non African American: 88 mL/min
GLUCOSE: 89 mg/dL (ref 70–99)
POTASSIUM: 4.1 meq/L (ref 3.5–5.3)
SODIUM: 141 meq/L (ref 135–145)

## 2014-07-31 LAB — LIPID PANEL
CHOLESTEROL: 157 mg/dL (ref 0–200)
HDL: 65 mg/dL (ref >=46)
LDL Cholesterol: 82 mg/dL (ref 0–99)
TRIGLYCERIDES: 52 mg/dL (ref <150)
Total CHOL/HDL Ratio: 2.4 Ratio
VLDL: 10 mg/dL (ref 0–40)

## 2014-07-31 LAB — HEMOGLOBIN A1C
Hgb A1c MFr Bld: 5.8 % — ABNORMAL HIGH (ref <5.7)
Mean Plasma Glucose: 120 mg/dL — ABNORMAL HIGH (ref <117)

## 2014-07-31 LAB — TSH: TSH: 3.706 u[IU]/mL (ref 0.350–4.500)

## 2014-07-31 LAB — MAGNESIUM: MAGNESIUM: 2 mg/dL (ref 1.5–2.5)

## 2014-07-31 MED ORDER — DIAZEPAM 5 MG PO TABS: ORAL_TABLET | ORAL | Status: DC

## 2014-07-31 NOTE — Patient Instructions (Signed)

## 2014-07-31 NOTE — Progress Notes (Signed)
Patient ID: Lori [name], female   DOB: [birth year], 74 y.o.   MRN: [id number]   This very nice 74 y.o. DWF presents for 6 month follow up with labile  Hypertension, Hyperlipidemia, Pre-Diabetes and Vitamin D Deficiency.    Patient is monitored expectantly for labile HTN predating since 2009 & BP has been controlled at home. In 1989 she had a False Positive ETT and subsequent (-) Heart Cath.  Today's BP: 124/76 mmHg. Patient has had no complaints of any cardiac type chest pain, palpitations, dyspnea/orthopnea/PND, dizziness, claudication, or dependent edema.   Hyperlipidemia is controlled with diet & meds. Patient denies myalgias or other med SE's. Last Lipids were  Total Chol 135; HDL 61; LDL  64; Trig 51 on 01/24/2014.   Also, the patient has history of PreDiabetes with A1c 5.9% in 2013 and has had no symptoms of reactive hypoglycemia, diabetic polys, paresthesias or visual blurring.  Last A1c was  5.8% on 01/24/2014.    Further, the patient also has history of Vitamin D Deficiency of 11 in 2008  and supplements vitamin D without any suspected side-effects. Last vitamin D was  40 on 01/24/2014.     Medication Sig  . Ascorbic Acid (VITAMIN C PO) Take by mouth daily.  . Cholecalciferol (VITAMIN D PO) Take 4,000 mg by mouth daily.  . Misc Natural Products (LUTEIN 20) CAPS Take 20 mg by mouth daily.  . Omega-3 Fatty Acids (FISH OIL PO) Take by mouth daily.  . diazepam (VALIUM) 5 MG tablet Take 1/2 to 1 tab tid prn for anxiety.  . hydrochlorothiazide (HYDRODIURIL) 25 MG tablet Take 25 mg by mouth daily. As needed   No Known Allergies  PMHx:   Past Medical History  Diagnosis Date  . Prediabetes   . Vitamin D deficiency   . Arthritis   . Asthma   . Hypertension   . Vertigo   . Migraines   . ASCVD (arteriosclerotic cardiovascular disease)   . Hyperlipidemia    Immunization History  Administered Date(s) Administered  . DT 01/24/2014  . Pneumococcal Polysaccharide-23 10/06/2011  . Td  04/23/2003   Past Surgical History  Procedure Laterality Date  . Tubal ligation    . Cerebral aneurysm repair    . Cataract extraction w/ intraocular lens  implant, bilateral  2009  . Foot surgery  1993   FHx:    Reviewed / unchanged  SHx:    Reviewed / unchanged  Systems Review:  Constitutional: Denies fever, chills, wt changes, headaches, insomnia, fatigue, night sweats, change in appetite. Eyes: Denies redness, blurred vision, diplopia, discharge, itchy, watery eyes.  ENT: Denies discharge, congestion, post nasal drip, epistaxis, sore throat, earache, hearing loss, dental pain, tinnitus, vertigo, sinus pain, snoring.  CV: Denies chest pain, palpitations, irregular heartbeat, syncope, dyspnea, diaphoresis, orthopnea, PND, claudication or edema. Respiratory: denies cough, dyspnea, DOE, pleurisy, hoarseness, laryngitis, wheezing.  Gastrointestinal: Denies dysphagia, odynophagia, heartburn, reflux, water brash, abdominal pain or cramps, nausea, vomiting, bloating, diarrhea, constipation, hematemesis, melena, hematochezia  or hemorrhoids. Genitourinary: Denies dysuria, frequency, urgency, nocturia, hesitancy, discharge, hematuria or flank pain. Musculoskeletal: Denies arthralgias, myalgias, stiffness, jt. swelling, pain, limping or strain/sprain.  Skin: Denies pruritus, rash, hives, warts, acne, eczema or change in skin lesion(s). Neuro: No weakness, tremor, incoordination, spasms, paresthesia or pain. Psychiatric: Denies confusion, memory loss or sensory loss. Endo: Denies change in weight, skin or hair change.  Heme/Lymph: No excessive bleeding, bruising or enlarged lymph nodes.  Physical Exam  BP 124/76   Pulse 64  Temp97.3 F   Resp 16  Ht 5' 4.5"   Wt 174 lb    BMI 29.42   Appears well nourished and in no distress. Eyes: PERRLA, EOMs, conjunctiva no swelling or erythema. Sinuses: No frontal/maxillary tenderness ENT/Mouth: EAC's clear, TM's nl w/o erythema, bulging. Nares  clear w/o erythema, swelling, exudates. Oropharynx clear without erythema or exudates. Oral hygiene is good. Tongue normal, non obstructing. Hearing intact.  Neck: Supple. Thyroid nl. Car 2+/2+ without bruits, nodes or JVD. Chest: Respirations nl with BS clear & equal w/o rales, rhonchi, wheezing or stridor.  Cor: Heart sounds normal w/ regular rate and rhythm without sig. murmurs, gallops, clicks, or rubs. Peripheral pulses normal and equal  without edema.  Abdomen: Soft & bowel sounds normal. Non-tender w/o guarding, rebound, hernias, masses, or organomegaly.  Lymphatics: Unremarkable.  Musculoskeletal: Full ROM all peripheral extremities, joint stability, 5/5 strength, and normal gait.  Skin: Warm, dry without exposed rashes, lesions or ecchymosis apparent.  Neuro: Cranial nerves intact, reflexes equal bilaterally. Sensory-motor testing grossly intact. Tendon reflexes grossly intact.  Pysch: Alert & oriented x 3.  Insight and judgement nl & appropriate. No ideations.  Assessment and Plan:  1. Essential hypertension  - TSH  2. Hyperlipidemia  - Lipid panel  3. Prediabetes  - Hemoglobin A1c - Insulin, random  4. Vitamin D deficiency  - Vit D  25 hydroxy   5. Medication management  - CBC with Differential/Platelet - BASIC METABOLIC PANEL WITH GFR - Hepatic function panel - Magnesium   Recommended regular exercise, BP monitoring, weight control, and discussed med and SE's. Recommended labs to assess and monitor clinical status. Further disposition pending results of labs. Over 30 minutes of exam, counseling, chart review was performed

## 2014-08-01 LAB — INSULIN, RANDOM: Insulin: 5.4 u[IU]/mL (ref 2.0–19.6)

## 2014-08-01 LAB — VITAMIN D 25 HYDROXY (VIT D DEFICIENCY, FRACTURES): VIT D 25 HYDROXY: 26 ng/mL — AB (ref 30–100)

## 2014-11-29 ENCOUNTER — Encounter: Payer: Self-pay | Admitting: Internal Medicine

## 2015-02-06 ENCOUNTER — Encounter: Payer: Self-pay | Admitting: Internal Medicine

## 2015-02-06 ENCOUNTER — Ambulatory Visit (INDEPENDENT_AMBULATORY_CARE_PROVIDER_SITE_OTHER): Payer: PPO | Admitting: Internal Medicine

## 2015-02-06 VITALS — BP 138/82 | HR 64 | Temp 97.8°F | Resp 16 | Ht 64.0 in | Wt 181.2 lb

## 2015-02-06 DIAGNOSIS — Z6831 Body mass index (BMI) 31.0-31.9, adult: Secondary | ICD-10-CM

## 2015-02-06 DIAGNOSIS — S069XAA Unspecified intracranial injury with loss of consciousness status unknown, initial encounter: Secondary | ICD-10-CM | POA: Insufficient documentation

## 2015-02-06 DIAGNOSIS — Z789 Other specified health status: Secondary | ICD-10-CM

## 2015-02-06 DIAGNOSIS — R42 Dizziness and giddiness: Secondary | ICD-10-CM | POA: Diagnosis not present

## 2015-02-06 DIAGNOSIS — Z1331 Encounter for screening for depression: Secondary | ICD-10-CM

## 2015-02-06 DIAGNOSIS — Z79899 Other long term (current) drug therapy: Secondary | ICD-10-CM | POA: Diagnosis not present

## 2015-02-06 DIAGNOSIS — R7303 Prediabetes: Secondary | ICD-10-CM | POA: Diagnosis not present

## 2015-02-06 DIAGNOSIS — E669 Obesity, unspecified: Secondary | ICD-10-CM | POA: Insufficient documentation

## 2015-02-06 DIAGNOSIS — Z1212 Encounter for screening for malignant neoplasm of rectum: Secondary | ICD-10-CM

## 2015-02-06 DIAGNOSIS — E785 Hyperlipidemia, unspecified: Secondary | ICD-10-CM

## 2015-02-06 DIAGNOSIS — I2510 Atherosclerotic heart disease of native coronary artery without angina pectoris: Secondary | ICD-10-CM | POA: Diagnosis not present

## 2015-02-06 DIAGNOSIS — I10 Essential (primary) hypertension: Secondary | ICD-10-CM

## 2015-02-06 DIAGNOSIS — Z0001 Encounter for general adult medical examination with abnormal findings: Secondary | ICD-10-CM | POA: Diagnosis not present

## 2015-02-06 DIAGNOSIS — S069X9A Unspecified intracranial injury with loss of consciousness of unspecified duration, initial encounter: Secondary | ICD-10-CM

## 2015-02-06 DIAGNOSIS — J4520 Mild intermittent asthma, uncomplicated: Secondary | ICD-10-CM

## 2015-02-06 DIAGNOSIS — E559 Vitamin D deficiency, unspecified: Secondary | ICD-10-CM | POA: Diagnosis not present

## 2015-02-06 DIAGNOSIS — Z9181 History of falling: Secondary | ICD-10-CM

## 2015-02-06 DIAGNOSIS — Z1389 Encounter for screening for other disorder: Secondary | ICD-10-CM

## 2015-02-06 DIAGNOSIS — R6889 Other general symptoms and signs: Secondary | ICD-10-CM | POA: Diagnosis not present

## 2015-02-06 DIAGNOSIS — E66811 Obesity, class 1: Secondary | ICD-10-CM

## 2015-02-06 DIAGNOSIS — S069XAS Unspecified intracranial injury with loss of consciousness status unknown, sequela: Secondary | ICD-10-CM | POA: Insufficient documentation

## 2015-02-06 HISTORY — DX: Obesity, class 1: E66.811

## 2015-02-06 LAB — CBC WITH DIFFERENTIAL/PLATELET
Basophils Absolute: 0 10*3/uL (ref 0.0–0.1)
Basophils Relative: 1 % (ref 0–1)
EOS ABS: 0 10*3/uL (ref 0.0–0.7)
EOS PCT: 1 % (ref 0–5)
HCT: 43.7 % (ref 36.0–46.0)
Hemoglobin: 14.5 g/dL (ref 12.0–15.0)
Lymphocytes Relative: 31 % (ref 12–46)
Lymphs Abs: 1.3 10*3/uL (ref 0.7–4.0)
MCH: 29.3 pg (ref 26.0–34.0)
MCHC: 33.2 g/dL (ref 30.0–36.0)
MCV: 88.3 fL (ref 78.0–100.0)
MONOS PCT: 9 % (ref 3–12)
MPV: 12 fL (ref 8.6–12.4)
Monocytes Absolute: 0.4 10*3/uL (ref 0.1–1.0)
Neutro Abs: 2.4 10*3/uL (ref 1.7–7.7)
Neutrophils Relative %: 58 % (ref 43–77)
PLATELETS: 209 10*3/uL (ref 150–400)
RBC: 4.95 MIL/uL (ref 3.87–5.11)
RDW: 15.1 % (ref 11.5–15.5)
WBC: 4.2 10*3/uL (ref 4.0–10.5)

## 2015-02-06 LAB — HEPATIC FUNCTION PANEL
ALBUMIN: 4.1 g/dL (ref 3.6–5.1)
ALT: 10 U/L (ref 6–29)
AST: 15 U/L (ref 10–35)
Alkaline Phosphatase: 93 U/L (ref 33–130)
BILIRUBIN TOTAL: 0.6 mg/dL (ref 0.2–1.2)
Bilirubin, Direct: 0.1 mg/dL (ref <=0.2)
Indirect Bilirubin: 0.5 mg/dL (ref 0.2–1.2)
Total Protein: 6.5 g/dL (ref 6.1–8.1)

## 2015-02-06 LAB — HEMOGLOBIN A1C
Hgb A1c MFr Bld: 5.7 % — ABNORMAL HIGH (ref <5.7)
MEAN PLASMA GLUCOSE: 117 mg/dL — AB (ref <117)

## 2015-02-06 LAB — BASIC METABOLIC PANEL WITH GFR
BUN: 16 mg/dL (ref 7–25)
CALCIUM: 9.1 mg/dL (ref 8.6–10.4)
CO2: 27 mmol/L (ref 20–31)
CREATININE: 0.7 mg/dL (ref 0.60–0.93)
Chloride: 106 mmol/L (ref 98–110)
GFR, Est Non African American: 87 mL/min (ref >=60)
Glucose, Bld: 98 mg/dL (ref 65–99)
Potassium: 4.3 mmol/L (ref 3.5–5.3)
Sodium: 144 mmol/L (ref 135–146)

## 2015-02-06 LAB — TSH: TSH: 5.24 u[IU]/mL — ABNORMAL HIGH (ref 0.350–4.500)

## 2015-02-06 LAB — LIPID PANEL
Cholesterol: 162 mg/dL (ref 125–200)
HDL: 46 mg/dL (ref >=46)
LDL Cholesterol: 90 mg/dL (ref <130)
TRIGLYCERIDES: 130 mg/dL (ref <150)
Total CHOL/HDL Ratio: 3.5 Ratio (ref <=5.0)
VLDL: 26 mg/dL (ref <30)

## 2015-02-06 LAB — MAGNESIUM: MAGNESIUM: 2.1 mg/dL (ref 1.5–2.5)

## 2015-02-06 NOTE — Patient Instructions (Signed)

## 2015-02-06 NOTE — Progress Notes (Signed)
Patient ID: Lori [name], female   DOB: 12/29/1940, 74 y.o.   MRN: [id number]  Medicare  Annual  Wellness Visit And  Comprehensive Evaluation & Examination   Assessment:   1. Essential hypertension  - Microalbumin / creatinine urine ratio - EKG 12-Lead - Urinalysis, Routine w reflex microscopic  - TSH  2. Hyperlipidemia  - Lipid panel - TSH  3. Prediabetes  - Hemoglobin A1c - Insulin, random  4. Vitamin D deficiency  - VITAMIN D 25 Hydroxy   5. ASCVD (arteriosclerotic cardiovascular disease)   6. Asthma, mild intermittent, uncomplicated   7. Screening for rectal cancer  - POC Hemoccult Bld/Stl   8. Depression screen   9. At low risk for fall   10. Vertigo due to brain injury ([place])   11. BMI 31.0-31.9,adult   12. Medication management  - Urinalysis, Routine w reflex microscopic  - CBC with Differential/Platelet - BASIC METABOLIC PANEL WITH GFR - Hepatic function panel - Magnesium  13. Encounter for general adult medical examination with abnormal findings  Plan:   During the course of the visit the patient was educated and counseled about appropriate screening and preventive services including:    Pneumococcal vaccine   Influenza vaccine  Td vaccine  Screening electrocardiogram  Bone densitometry screening  Colorectal cancer screening  Diabetes screening  Glaucoma screening  Nutrition counseling   Advanced directives: requested  Screening recommendations, referrals: Vaccinations:  Immunization History  Administered Date(s) Administered  . DT 01/24/2014  . Pneumococcal Polysaccharide-23 10/06/2011  . Td 04/23/2003  Influenza vaccine declined Prevnar vaccine declined Shingles vaccine declined Hep B vaccine not indicated  Nutrition assessed and recommended  Colonoscopy 2013 & declines f/u Recommended yearly ophthalmology/optometry visit for glaucoma screening and checkup Recommended yearly dental visit for hygiene and  checkup Advanced directives - no - given forms  Conditions/risks identified: BMI: Discussed weight loss, diet, and increase physical activity.  Increase physical activity: AHA recommends 150 minutes of physical activity a week.  Medications reviewed PreDiabetes is at goal, ACE/ARB therapy: Not Indicated Urinary Incontinence is not an issue: discussed non pharmacology and pharmacology options.  Fall risk: low- discussed PT, home fall assessment, medications.   Subjective:      Lori [name] presents for [facility] Annual Wellness Visit and presents for a comprehensive evaluation, examination and management of multiple medical co-morbidities.  Date of last medicare wellness visit was 01/24/2014. This very nice 74 y.o. DWF presents for follow up with Hypertension, Hyperlipidemia, Pre-Diabetes and Vitamin D Deficiency.     Patient has hx/o Labile HTH  Since 2009 and and has been on prn diuretics in the past. In 1989 she had a Neg Heart  Cath prompted by a false (+) stress test. Today's BP: 138/82 mmHg. Patient has had no complaints of any cardiac type chest pain, palpitations, dyspnea/orthopnea/PND, dizziness, claudication, or dependent edema. In 2001 , patient had brain surgery for a cerebral aneurysm and has had chronic vertigo since which frequently interferes with her daily activities.      Hyperlipidemia is controlled with diet & meds. Patient denies myalgias or other med SE's. Last Lipids were at goal with Cholesterol 157; HDL 65; LDL 82; Triglycerides 52 on 07/31/2014.     Also, the patient has history of PreDiabetes in 2013 with A1c 5.9%  and has had no symptoms of reactive hypoglycemia, diabetic polys, paresthesias or visual blurring.  Last A1c was 5.8% on 07/31/2014.     Further, the patient also has history of Vitamin D  Deficiency of 11 on 2008  and she inconsistently supplements Vit D. Last vitamin D was still very low at  26 on 07/31/2014.  Names of Other Physician/Practitioners you  currently use: 1. [facility] here for primary care 2. Dr Atha Starks, eye doctor, last visit 2015 3. Dr Arrie Aran, DDS, dentist, last visit 2014 or 2015  Patient Care Team: Unk Pinto, MD as PCP - General (Internal Medicine) Lafayette Dragon, MD as Consulting Physician (Gastroenterology) Clent Jacks, MD as Consulting Physician (Ophthalmology)  Medication Review: Medication Sig  . Ascorbic Acid (VITAMIN C  Take by mouth daily.  Marland Kitchen VITAMIN D  Take 4,000 mg by mouth daily.  . diazepam 5 MG tablet Take 1/2 to 1 tab tid prn for anxiety.  Esperanza Heir 20mg  CAPS Take 20 mg by mouth daily.  . Omega-3 Fatty Acids (FISH OIL PO) Take by mouth daily.   No Known Allergies  Current Problems (verified) Patient Active Problem List   Diagnosis Date Noted  . Medication management 11/18/2013  . Hyperlipidemia   . Prediabetes   . Vitamin D deficiency   . Asthma   . Hypertension   . Migraines   . ASCVD (arteriosclerotic cardiovascular disease)    Screening Tests Health Maintenance  Topic Date Due  . COLONOSCOPY  08/28/1992  . ZOSTAVAX  08/29/2002  . PNA vac Low Risk Adult (2 of 2 - PCV13) 10/05/2012  . MAMMOGRAM  09/16/2014  . INFLUENZA VACCINE  10/15/2014  . TETANUS/TDAP  01/25/2024  . DEXA SCAN  Completed   Immunization History  Administered Date(s) Administered  . DT 01/24/2014  . Pneumococcal Polysaccharide-23 10/06/2011  . Td 04/23/2003   Preventative care: Last colonoscopy: 2013 & refuses f/u.  Past Medical History  Diagnosis Date  . Prediabetes   . Vitamin D deficiency   . Arthritis   . Asthma   . Hypertension   . Vertigo   . Migraines   . ASCVD (arteriosclerotic cardiovascular disease)   . Hyperlipidemia    Past Surgical History  Procedure Laterality Date  . Tubal ligation    . Cerebral aneurysm repair    . Cataract extraction w/ intraocular lens  implant, bilateral  2009  . Foot surgery  1993   Risk Factors: Tobacco Social  History  Substance Use Topics  . Smoking status: Never Smoker   . Smokeless tobacco: Never Used  . Alcohol Use: No   She does not smoke.  Patient is not a former smoker. Are there smokers in your home (other than you)?  No Alcohol Current alcohol use: none  Caffeine Current caffeine use: coffee 3-4 /day  Exercise Current exercise: housecleaning and yard work  Nutrition/Diet Current diet: in general, a "healthy" diet    Cardiac risk factors: advanced age (older than 7 for men, 45 for women), dyslipidemia, hypertension, obesity (BMI >= 30 kg/m2) and sedentary lifestyle.  Depression Screen (Note: if answer to either of the following is "Yes", a more complete depression screening is indicated)   Q1: Over the past two weeks, have you felt down, depressed or hopeless? No  Q2: Over the past two weeks, have you felt little interest or pleasure in doing things? No  Have you lost interest or pleasure in daily life? No  Do you often feel hopeless? No  Do you cry easily over simple problems? No  Activities of Daily Living In your present state of health, do you have any difficulty performing the following activities?:  Driving? No  Managing money?  No Feeding yourself? No Getting from bed to chair? No Climbing a flight of stairs? No Preparing food and eating?: No Bathing or showering? No Getting dressed: No Getting to the toilet? No Using the toilet:No Moving around from place to place: No In the past year have you fallen or had a near fall?:No   Are you sexually active?  No  Do you have more than one partner?  No  Vision Difficulties: No  Hearing Difficulties: No Do you often ask people to speak up or repeat themselves? No Do you experience ringing or noises in your ears? No Do you have difficulty understanding soft or whispered voices? Sometimes.  Cognition  Do you feel that you have a problem with memory?No  Do you often misplace items? No  Do you feel safe at home?   Yes  Advanced directives Does patient have a [place]? No given forms Does patient have a Living Will? No given forms  ROS: Constitutional: Denies fever, chills, weight loss/gain, headaches, insomnia, fatigue, night sweats, and change in appetite. Eyes: Denies redness, blurred vision, diplopia, discharge, itchy, watery eyes.  ENT: Denies discharge, congestion, post nasal drip, epistaxis, sore throat, earache, hearing loss, dental pain, Tinnitus, Vertigo, Sinus pain, snoring.  Cardio: Denies chest pain, palpitations, irregular heartbeat, syncope, dyspnea, diaphoresis, orthopnea, PND, claudication, edema Respiratory: denies cough, dyspnea, DOE, pleurisy, hoarseness, laryngitis, wheezing.  Gastrointestinal: Denies dysphagia, heartburn, reflux, water brash, pain, cramps, nausea, vomiting, bloating, diarrhea, constipation, hematemesis, melena, hematochezia, jaundice, hemorrhoids Genitourinary: Denies dysuria, frequency, urgency, nocturia, hesitancy, discharge, hematuria, flank pain Breast: Breast lumps, nipple discharge, bleeding.  Musculoskeletal: Denies arthralgia, myalgia, stiffness, Jt. Swelling, pain, limp, and strain/sprain. Denies falls. Skin: Denies puritis, rash, hives, warts, acne, eczema, changing in skin lesion Neuro: No weakness, tremor, incoordination, spasms, paresthesia, pain Psychiatric: Denies confusion, memory loss, sensory loss. Denies Depression. Endocrine: Denies change in weight, skin, hair change, nocturia, and paresthesia, diabetic polys, visual blurring, hyper / hypo glycemic episodes.  Heme/Lymph: No excessive bleeding, bruising, enlarged lymph nodes  Objective:     BP 138/82 mmHg  Pulse 64  Temp(Src) 97.8 F (36.6 C)  Resp 16  Ht 5\' 4"  (1.626 m)  Wt 181 lb 3.2 oz (82.192 kg)  BMI 31.09 kg/m2  General Appearance: Well nourished, alert, WD/WN, female and in no apparent distress. Eyes: PERRLA, EOMs, conjunctiva no swelling or erythema, normal  fundi and vessels. Sinuses: No frontal/maxillary tenderness ENT/Mouth: EACs patent / TMs  nl. Nares clear without erythema, swelling, mucoid exudates. Oral hygiene is good. No erythema, swelling, or exudate. Tongue normal, non-obstructing. Tonsils not swollen or erythematous. Hearing normal.  Neck: Supple, thyroid normal. No bruits, nodes or JVD. Respiratory: Respiratory effort normal.  BS equal and clear bilateral without rales, rhonci, wheezing or stridor. Cardio: Heart sounds are normal with regular rate and rhythm and no murmurs, rubs or gallops. Peripheral pulses are normal and equal bilaterally without edema. No aortic or femoral bruits. Chest: symmetric with normal excursions and percussion. Breasts: Symmetric, without lumps, nipple discharge, retractions, or fibrocystic changes.  Abdomen: Flat, soft  with nl bowel sounds. Nontender, no guarding, rebound, hernias, masses, or organomegaly.  Lymphatics: Non tender without lymphadenopathy.  Genitourinary:  Musculoskeletal: Full ROM all peripheral extremities, joint stability, 5/5 strength, and normal gait. Skin: Warm and dry without rashes, lesions, cyanosis, clubbing or  ecchymosis.  Neuro: Cranial nerves intact, reflexes equal bilaterally. Normal muscle tone, no cerebellar symptoms. Sensation intact.  Pysch: Alert and oriented X 3,  normal affect, Insight and Judgment appropriate.   Cognitive Testing  Alert? Yes  Normal Appearance?Yes  Oriented to person? Yes  Place? Yes   Time? Yes  Recall of three objects?  Yes  Can perform simple calculations? Yes  Displays appropriate judgment? Yes  Can read the correct time from a watch/clock?Yes  Medicare Attestation I have personally reviewed: The patient's medical and social history Their use of alcohol, tobacco or illicit drugs Their current medications and supplements The patient's functional ability including ADLs,fall risks, home safety risks, cognitive, and hearing and visual  impairment Diet and physical activities Evidence for depression or mood disorders  The patient's weight, height, BMI, and visual acuity have been recorded in the chart.  I have made referrals, counseling, and provided education to the patient based on review of the above and I have provided the patient with a written personalized care plan for preventive services.  Over 40 minutes of exam, counseling, chart review was performed.  Anisah Kuck DAVID, MD   02/06/2015

## 2015-02-07 LAB — URINALYSIS, ROUTINE W REFLEX MICROSCOPIC
Bilirubin Urine: NEGATIVE
Glucose, UA: NEGATIVE
HGB URINE DIPSTICK: NEGATIVE
KETONES UR: NEGATIVE
NITRITE: NEGATIVE
PH: 5.5 (ref 5.0–8.0)
PROTEIN: NEGATIVE
Specific Gravity, Urine: 1.023 (ref 1.001–1.035)

## 2015-02-07 LAB — URINALYSIS, MICROSCOPIC ONLY
Bacteria, UA: NONE SEEN [HPF]
CASTS: NONE SEEN [LPF]
CRYSTALS: NONE SEEN [HPF]
Yeast: NONE SEEN [HPF]

## 2015-02-07 LAB — MICROALBUMIN / CREATININE URINE RATIO
Creatinine, Urine: 223 mg/dL (ref 20–320)
MICROALB UR: 1.4 mg/dL
MICROALB/CREAT RATIO: 6 ug/mg{creat} (ref <30)

## 2015-02-07 LAB — VITAMIN D 25 HYDROXY (VIT D DEFICIENCY, FRACTURES): VIT D 25 HYDROXY: 32 ng/mL (ref 30–100)

## 2015-02-08 LAB — INSULIN, RANDOM: INSULIN: 6.5 u[IU]/mL (ref 2.0–19.6)

## 2015-02-12 ENCOUNTER — Other Ambulatory Visit: Payer: Self-pay | Admitting: *Deleted

## 2015-02-12 MED ORDER — DIAZEPAM 5 MG PO TABS: ORAL_TABLET | ORAL | Status: DC

## 2015-08-07 ENCOUNTER — Ambulatory Visit (INDEPENDENT_AMBULATORY_CARE_PROVIDER_SITE_OTHER): Payer: PPO | Admitting: Internal Medicine

## 2015-08-07 ENCOUNTER — Encounter: Payer: Self-pay | Admitting: Internal Medicine

## 2015-08-07 VITALS — BP 138/80 | HR 66 | Temp 98.0°F | Resp 16 | Ht 64.0 in | Wt 181.0 lb

## 2015-08-07 DIAGNOSIS — Z79899 Other long term (current) drug therapy: Secondary | ICD-10-CM

## 2015-08-07 DIAGNOSIS — E785 Hyperlipidemia, unspecified: Secondary | ICD-10-CM | POA: Diagnosis not present

## 2015-08-07 DIAGNOSIS — I10 Essential (primary) hypertension: Secondary | ICD-10-CM | POA: Diagnosis not present

## 2015-08-07 DIAGNOSIS — E559 Vitamin D deficiency, unspecified: Secondary | ICD-10-CM

## 2015-08-07 DIAGNOSIS — R7303 Prediabetes: Secondary | ICD-10-CM

## 2015-08-07 DIAGNOSIS — R7309 Other abnormal glucose: Secondary | ICD-10-CM | POA: Diagnosis not present

## 2015-08-07 LAB — CBC WITH DIFFERENTIAL/PLATELET
BASOS PCT: 1 %
Basophils Absolute: 60 cells/uL (ref 0–200)
EOS ABS: 60 {cells}/uL (ref 15–500)
Eosinophils Relative: 1 %
HEMATOCRIT: 41.7 % (ref 35.0–45.0)
Hemoglobin: 13.9 g/dL (ref 11.7–15.5)
LYMPHS ABS: 1320 {cells}/uL (ref 850–3900)
Lymphocytes Relative: 22 %
MCH: 29.5 pg (ref 27.0–33.0)
MCHC: 33.3 g/dL (ref 32.0–36.0)
MCV: 88.5 fL (ref 80.0–100.0)
MONO ABS: 540 {cells}/uL (ref 200–950)
MPV: 11.4 fL (ref 7.5–12.5)
Monocytes Relative: 9 %
NEUTROS ABS: 4020 {cells}/uL (ref 1500–7800)
Neutrophils Relative %: 67 %
Platelets: 202 10*3/uL (ref 140–400)
RBC: 4.71 MIL/uL (ref 3.80–5.10)
RDW: 15 % (ref 11.0–15.0)
WBC: 6 10*3/uL (ref 3.8–10.8)

## 2015-08-07 LAB — LIPID PANEL
CHOL/HDL RATIO: 2.3 ratio (ref <=5.0)
Cholesterol: 143 mg/dL (ref 125–200)
HDL: 61 mg/dL (ref >=46)
LDL CALC: 65 mg/dL (ref <130)
Triglycerides: 83 mg/dL (ref <150)
VLDL: 17 mg/dL (ref <30)

## 2015-08-07 LAB — HEPATIC FUNCTION PANEL
ALBUMIN: 4.2 g/dL (ref 3.6–5.1)
ALK PHOS: 84 U/L (ref 33–130)
ALT: 12 U/L (ref 6–29)
AST: 16 U/L (ref 10–35)
BILIRUBIN INDIRECT: 0.5 mg/dL (ref 0.2–1.2)
BILIRUBIN TOTAL: 0.6 mg/dL (ref 0.2–1.2)
Bilirubin, Direct: 0.1 mg/dL (ref <=0.2)
TOTAL PROTEIN: 6.3 g/dL (ref 6.1–8.1)

## 2015-08-07 LAB — BASIC METABOLIC PANEL WITH GFR
BUN: 20 mg/dL (ref 7–25)
CHLORIDE: 106 mmol/L (ref 98–110)
CO2: 26 mmol/L (ref 20–31)
Calcium: 8.8 mg/dL (ref 8.6–10.4)
Creat: 0.8 mg/dL (ref 0.60–0.93)
GFR, EST NON AFRICAN AMERICAN: 74 mL/min (ref >=60)
GFR, Est African American: 85 mL/min (ref >=60)
GLUCOSE: 97 mg/dL (ref 65–99)
POTASSIUM: 4.3 mmol/L (ref 3.5–5.3)
Sodium: 143 mmol/L (ref 135–146)

## 2015-08-07 LAB — HEMOGLOBIN A1C
HEMOGLOBIN A1C: 5.8 % — AB (ref <5.7)
Mean Plasma Glucose: 120 mg/dL

## 2015-08-07 LAB — TSH: TSH: 4.17 m[IU]/L

## 2015-08-07 NOTE — Progress Notes (Signed)
Assessment and Plan:  Hypertension:  -Continue medication,  -monitor blood pressure at home.  -Continue DASH diet.   -Reminder to go to the ER if any CP, SOB, nausea, dizziness, severe HA, changes vision/speech, left arm numbness and tingling, and jaw pain.  Cholesterol: -Continue diet and exercise.  -Check cholesterol.   Pre-diabetes: -Continue diet and exercise.  -Check A1C  Vitamin D Def: -check level -continue medications.   Right Middle ear effusion -feel that this was likely contributor to worsening vertigo.  -patient not interested in taking allergy medications  Continue diet and meds as discussed. Further disposition pending results of labs.  HPI 75 y.o. female  presents for 3 month follow up with hypertension, hyperlipidemia, prediabetes and vitamin D.   Her blood pressure has been controlled at home, today their BP is BP: 138/80 mmHg.   She does not workout. She denies chest pain, shortness of breath, dizziness.  She notes that she is walking and taking care of all her animals.     She is on cholesterol medication and denies myalgias. Her cholesterol is at goal. The cholesterol last visit was:   Lab Results  Component Value Date   CHOL 162 02/06/2015   HDL 46 02/06/2015   LDLCALC 90 02/06/2015   TRIG 130 02/06/2015   CHOLHDL 3.5 02/06/2015     She has been working on diet and exercise for prediabetes, and denies foot ulcerations, hyperglycemia, hypoglycemia , increased appetite, nausea, paresthesia of the feet, polydipsia, polyuria, visual disturbances, vomiting and weight loss. Last A1C in the office was:  Lab Results  Component Value Date   HGBA1C 5.7* 02/06/2015    Patient is on Vitamin D supplement.  Lab Results  Component Value Date   VD25OH 32 02/06/2015     Patient notes that her vertigo seems to be getting worse especially first thing in the mornings.  She notes that the vertigo improves at around 11.  She notes that it is usually worse during  allergy season.  She doesn't take anything.  She only takes valium for when she has the vertigo.  She is not interested in taking any allergy medications.    Current Medications:  Current Outpatient Prescriptions on File Prior to Visit  Medication Sig Dispense Refill  . Cholecalciferol (VITAMIN D PO) Take 4,000 mg by mouth daily.    . diazepam (VALIUM) 5 MG tablet Take 1/2 to 1 tab tid prn for anxiety. 90 tablet 0   No current facility-administered medications on file prior to visit.    Medical History:  Past Medical History  Diagnosis Date  . Prediabetes   . Vitamin D deficiency   . Arthritis   . Asthma   . Hypertension   . Vertigo   . Migraines   . ASCVD (arteriosclerotic cardiovascular disease)   . Hyperlipidemia     Allergies: No Known Allergies   Review of Systems:  Review of Systems  Constitutional: Negative for fever, chills, weight loss and malaise/fatigue.  HENT: Negative for congestion, ear pain and sore throat.   Respiratory: Negative for cough, shortness of breath and wheezing.   Cardiovascular: Negative for chest pain, palpitations and leg swelling.  Gastrointestinal: Negative for heartburn, diarrhea, constipation, blood in stool and melena.  Genitourinary: Negative.   Skin: Negative.   Neurological: Negative for dizziness, sensory change, loss of consciousness and headaches.  Psychiatric/Behavioral: Negative for depression. The patient is not nervous/anxious and does not have insomnia.     Family history- Review and unchanged  Social history- Review and unchanged  Physical Exam: BP 138/80 mmHg  Pulse 66  Temp(Src) 98 F (36.7 C) (Temporal)  Resp 16  Ht 5\' 4"  (1.626 m)  Wt 181 lb (82.101 kg)  BMI 31.05 kg/m2 Wt Readings from Last 3 Encounters:  08/07/15 181 lb (82.101 kg)  02/06/15 181 lb 3.2 oz (82.192 kg)  07/31/14 174 lb (78.926 kg)    General Appearance: Well nourished well developed, in no apparent distress. Eyes: PERRLA, EOMs,  conjunctiva no swelling or erythema ENT/Mouth: Ear canals normal without obstruction, swelling, erythma, discharge.  TMs normal bilaterally.  Oropharynx moist, clear, without exudate, or postoropharyngeal swelling. Neck: Supple, thyroid normal,no cervical adenopathy  Respiratory: Respiratory effort normal, Breath sounds clear A&P without rhonchi, wheeze, or rale.  No retractions, no accessory usage. Cardio: RRR with no MRGs. Brisk peripheral pulses without edema.  Abdomen: Soft, + BS,  Non tender, no guarding, rebound, hernias, masses. Musculoskeletal: Full ROM, 5/5 strength, Normal gait Skin: Warm, dry without rashes, lesions, ecchymosis.  Neuro: Awake and oriented X 3, Cranial nerves intact. Normal muscle tone, no cerebellar symptoms. Psych: Normal affect, Insight and Judgment appropriate.    [name], PA-C 11:32 AM [facility]

## 2015-08-13 ENCOUNTER — Other Ambulatory Visit: Payer: Self-pay | Admitting: *Deleted

## 2015-08-13 MED ORDER — DIAZEPAM 5 MG PO TABS: ORAL_TABLET | ORAL | Status: DC

## 2016-02-11 ENCOUNTER — Other Ambulatory Visit: Payer: Self-pay | Admitting: *Deleted

## 2016-02-11 ENCOUNTER — Encounter: Payer: Self-pay | Admitting: Internal Medicine

## 2016-02-11 ENCOUNTER — Ambulatory Visit (INDEPENDENT_AMBULATORY_CARE_PROVIDER_SITE_OTHER): Payer: PPO | Admitting: Internal Medicine

## 2016-02-11 VITALS — BP 132/80 | HR 72 | Temp 97.4°F | Resp 16 | Ht 64.0 in | Wt 182.6 lb

## 2016-02-11 DIAGNOSIS — R6889 Other general symptoms and signs: Secondary | ICD-10-CM

## 2016-02-11 DIAGNOSIS — Z0001 Encounter for general adult medical examination with abnormal findings: Secondary | ICD-10-CM | POA: Diagnosis not present

## 2016-02-11 DIAGNOSIS — S069XAA Unspecified intracranial injury with loss of consciousness status unknown, initial encounter: Secondary | ICD-10-CM

## 2016-02-11 DIAGNOSIS — Z136 Encounter for screening for cardiovascular disorders: Secondary | ICD-10-CM | POA: Diagnosis not present

## 2016-02-11 DIAGNOSIS — R7303 Prediabetes: Secondary | ICD-10-CM

## 2016-02-11 DIAGNOSIS — E782 Mixed hyperlipidemia: Secondary | ICD-10-CM | POA: Diagnosis not present

## 2016-02-11 DIAGNOSIS — Z79899 Other long term (current) drug therapy: Secondary | ICD-10-CM

## 2016-02-11 DIAGNOSIS — I10 Essential (primary) hypertension: Secondary | ICD-10-CM | POA: Diagnosis not present

## 2016-02-11 DIAGNOSIS — I2510 Atherosclerotic heart disease of native coronary artery without angina pectoris: Secondary | ICD-10-CM

## 2016-02-11 DIAGNOSIS — R42 Dizziness and giddiness: Secondary | ICD-10-CM

## 2016-02-11 DIAGNOSIS — E559 Vitamin D deficiency, unspecified: Secondary | ICD-10-CM | POA: Diagnosis not present

## 2016-02-11 DIAGNOSIS — Z1212 Encounter for screening for malignant neoplasm of rectum: Secondary | ICD-10-CM

## 2016-02-11 DIAGNOSIS — S069X9A Unspecified intracranial injury with loss of consciousness of unspecified duration, initial encounter: Secondary | ICD-10-CM

## 2016-02-11 LAB — CBC WITH DIFFERENTIAL/PLATELET
BASOS PCT: 1 %
Basophils Absolute: 49 cells/uL (ref 0–200)
EOS PCT: 1 %
Eosinophils Absolute: 49 cells/uL (ref 15–500)
HEMATOCRIT: 43.9 % (ref 35.0–45.0)
HEMOGLOBIN: 14.4 g/dL (ref 11.7–15.5)
LYMPHS ABS: 1372 {cells}/uL (ref 850–3900)
Lymphocytes Relative: 28 %
MCH: 29.5 pg (ref 27.0–33.0)
MCHC: 32.8 g/dL (ref 32.0–36.0)
MCV: 90 fL (ref 80.0–100.0)
MONO ABS: 441 {cells}/uL (ref 200–950)
MPV: 12.5 fL (ref 7.5–12.5)
Monocytes Relative: 9 %
NEUTROS ABS: 2989 {cells}/uL (ref 1500–7800)
NEUTROS PCT: 61 %
Platelets: 215 10*3/uL (ref 140–400)
RBC: 4.88 MIL/uL (ref 3.80–5.10)
RDW: 14.7 % (ref 11.0–15.0)
WBC: 4.9 10*3/uL (ref 3.8–10.8)

## 2016-02-11 LAB — HEPATIC FUNCTION PANEL
ALK PHOS: 87 U/L (ref 33–130)
ALT: 10 U/L (ref 6–29)
AST: 15 U/L (ref 10–35)
Albumin: 4.5 g/dL (ref 3.6–5.1)
BILIRUBIN DIRECT: 0.1 mg/dL (ref <=0.2)
BILIRUBIN INDIRECT: 0.3 mg/dL (ref 0.2–1.2)
Total Bilirubin: 0.4 mg/dL (ref 0.2–1.2)
Total Protein: 6.6 g/dL (ref 6.1–8.1)

## 2016-02-11 LAB — BASIC METABOLIC PANEL WITH GFR
BUN: 17 mg/dL (ref 7–25)
CALCIUM: 9.2 mg/dL (ref 8.6–10.4)
CO2: 26 mmol/L (ref 20–31)
Chloride: 107 mmol/L (ref 98–110)
Creat: 0.95 mg/dL — ABNORMAL HIGH (ref 0.60–0.93)
GFR, EST AFRICAN AMERICAN: 69 mL/min (ref >=60)
GFR, EST NON AFRICAN AMERICAN: 60 mL/min (ref >=60)
Glucose, Bld: 50 mg/dL — ABNORMAL LOW (ref 65–99)
POTASSIUM: 3.7 mmol/L (ref 3.5–5.3)
SODIUM: 146 mmol/L (ref 135–146)

## 2016-02-11 LAB — LIPID PANEL
CHOLESTEROL: 151 mg/dL (ref <200)
HDL: 56 mg/dL (ref >50)
LDL Cholesterol: 74 mg/dL (ref <100)
TRIGLYCERIDES: 105 mg/dL (ref <150)
Total CHOL/HDL Ratio: 2.7 Ratio (ref <5.0)
VLDL: 21 mg/dL (ref <30)

## 2016-02-11 LAB — MAGNESIUM: Magnesium: 2.1 mg/dL (ref 1.5–2.5)

## 2016-02-11 LAB — TSH: TSH: 2.87 mIU/L

## 2016-02-11 MED ORDER — DIAZEPAM 5 MG PO TABS: ORAL_TABLET | ORAL | 0 refills | Status: DC

## 2016-02-11 NOTE — Progress Notes (Signed)
[name] Lori [name], M.D.    Lori [name]. Lori [name], P.A.-C      Lori [name], P.A.-C  Lori [name]                [address] [name] [id number] Telephone [phone] Telefax [phone]  Annual Screening/Preventative Visit & Comprehensive Evaluation &  Examination     This very nice 75 y.o. DWF presents for a Screening/Preventative Visit & comprehensive evaluation and management of multiple medical co-morbidities.  Patient has been followed for HTN, T2_NIDDM  Prediabetes, Hyperlipidemia and Vitamin D Deficiency.     In 2001, patient had Brain Surgery for an aneurysm and reports Chronic Vertigo since which limits her activities.       Labile HTN predates circa 2009. Patient had a FALSE POSITIVE Stress Test with negative Heart Cath in 2009. Patient's BP has been controlled at home and patient denies any cardiac symptoms as chest pain, palpitations, shortness of breath, dizziness or ankle swelling. Today's BP is at goal - 132/80.     Patient's hyperlipidemia is controlled with diet and medications. Patient denies myalgias or other medication SE's. Last lipids were at goal: Lab Results  Component Value Date   CHOL 143 08/07/2015   HDL 61 08/07/2015   LDLCALC 65 08/07/2015   TRIG 83 08/07/2015   CHOLHDL 2.3 08/07/2015      Patient has prediabetes predating since 2013 with A1c 5.9% and patient denies reactive hypoglycemic symptoms, visual blurring, diabetic polys, or paresthesias. Last A1c was near goal:  Lab Results  Component Value Date   HGBA1C 5.8 (H) 08/07/2015      Finally, patient has history of Vitamin D Deficiency in 2008 of "11" and last Vitamin D was still low: Lab Results  Component Value Date   VD25OH 32 02/06/2015   Current Outpatient Prescriptions on File Prior to Visit  Medication Sig  . Cholecalciferol (VITAMIN D PO) Take 4,000 mg by mouth daily.  . Multiple  Vitamins-Minerals (CENTRUM SILVER 50+WOMEN PO) Take by mouth daily.   No current facility-administered medications on file prior to visit.    No Known Allergies Past Medical History:  Diagnosis Date  . Arthritis   . ASCVD (arteriosclerotic cardiovascular disease)   . Asthma   . Hyperlipidemia   . Hypertension   . Migraines   . Prediabetes   . Vertigo   . Vitamin D deficiency    [name] Maintenance  Topic Date Due  . COLONOSCOPY  08/28/1992  . ZOSTAVAX  08/29/2002  . PNA vac Low Risk Adult (2 of 2 - PCV13) 10/05/2012  . MAMMOGRAM  09/16/2014  . INFLUENZA VACCINE  10/15/2015  . TETANUS/TDAP  01/25/2024  . DEXA SCAN  Completed   Immunization History  Administered Date(s) Administered  . DT 01/24/2014  . Pneumococcal Polysaccharide-23 10/06/2011  . Td 04/23/2003   Past Surgical History:  Procedure Laterality Date  . CATARACT EXTRACTION W/ INTRAOCULAR LENS  IMPLANT, BILATERAL  2009  . CEREBRAL ANEURYSM REPAIR    . FOOT SURGERY  1993  . TUBAL LIGATION     Family History  Problem Relation Age of Onset  . Hypertension Mother   . Diabetes Mother   . Cancer Mother     Pancreatic  . Cancer Father     Skin   Social History  Substance Use Topics  . Smoking status: Former Smoker  Quit date: 08/07/1990  . Smokeless tobacco: Never Used  . Alcohol use No    ROS Constitutional: Denies fever, chills, weight loss/gain, headaches, insomnia,  night sweats, and change in appetite. Does c/o fatigue. Eyes: Denies redness, blurred vision, diplopia, discharge, itchy, watery eyes.  ENT: Denies discharge, congestion, post nasal drip, epistaxis, sore throat, earache, hearing loss, dental pain, Tinnitus, Vertigo, Sinus pain, snoring.  Cardio: Denies chest pain, palpitations, irregular heartbeat, syncope, dyspnea, diaphoresis, orthopnea, PND, claudication, edema Respiratory: denies cough, dyspnea, DOE, pleurisy, hoarseness, laryngitis, wheezing.  Gastrointestinal: Denies dysphagia,  heartburn, reflux, water brash, pain, cramps, nausea, vomiting, bloating, diarrhea, constipation, hematemesis, melena, hematochezia, jaundice, hemorrhoids Genitourinary: Denies dysuria, frequency, urgency, nocturia, hesitancy, discharge, hematuria, flank pain Breast: Breast lumps, nipple discharge, bleeding.  Musculoskeletal: Denies arthralgia, myalgia, stiffness, Jt. Swelling, pain, limp, and strain/sprain. Denies falls. Skin: Denies puritis, rash, hives, warts, acne, eczema, changing in skin lesion Neuro: No weakness, tremor, incoordination, spasms, paresthesia, pain Psychiatric: Denies confusion, memory loss, sensory loss. Denies Depression. Endocrine: Denies change in weight, skin, hair change, nocturia, and paresthesia, diabetic polys, visual blurring, hyper / hypo glycemic episodes.  Heme/Lymph: No excessive bleeding, bruising, enlarged lymph nodes.  Physical Exam  BP 132/80   Pulse 72   Temp 97.4 F (36.3 C)   Resp 16   Ht 5\' 4"  (1.626 m)   Wt 182 lb 9.6 oz (82.8 kg)   BMI 31.34 kg/m   General Appearance: Well nourished and in no apparent distress.  Eyes: PERRLA, EOMs, conjunctiva no swelling or erythema, normal fundi and vessels. Sinuses: No frontal/maxillary tenderness ENT/Mouth: EACs patent / TMs  nl. Nares clear without erythema, swelling, mucoid exudates. Oral hygiene is good. No erythema, swelling, or exudate. Tongue normal, non-obstructing. Tonsils not swollen or erythematous. Hearing normal.  Neck: Supple, thyroid normal. No bruits, nodes or JVD. Respiratory: Respiratory effort normal.  BS equal and clear bilateral without rales, rhonci, wheezing or stridor. Cardio: Heart sounds are normal with regular rate and rhythm and no murmurs, rubs or gallops. Peripheral pulses are normal and equal bilaterally without edema. No aortic or femoral bruits. Chest: symmetric with normal excursions and percussion. Breasts: Symmetric, without lumps, nipple discharge, retractions, or  fibrocystic changes.  Abdomen: Flat, soft with bowel sounds active. Nontender, no guarding, rebound, hernias, masses, or organomegaly.  Lymphatics: Non tender without lymphadenopathy.  Genitourinary:  Musculoskeletal: Full ROM all peripheral extremities, joint stability, 5/5 strength, and normal gait. Skin: Warm and dry without rashes, lesions, cyanosis, clubbing or  ecchymosis.  Neuro: Cranial nerves intact, reflexes equal bilaterally. Normal muscle tone, no cerebellar symptoms. Sensation intact.  Pysch: Alert and oriented X 3, normal affect, Insight and Judgment appropriate.   Assessment and Plan  1. Annual Preventative Screening Examination  - Microalbumin / creatinine urine ratio - EKG 12-Lead - POC Hemoccult Bld/Stl (3-Cd Home Screen); Future - Urinalysis, Routine w reflex microscopic - CBC with Differential/Platelet - BASIC METABOLIC PANEL WITH GFR - Hepatic function panel - Magnesium - Lipid panel - TSH - Hemoglobin A1c - Insulin, random - VITAMIN D 25 Hydroxy  2. Essential hypertension  - Microalbumin / creatinine urine ratio - EKG 12-Lead - TSH  3. Mixed hyperlipidemia  - EKG 12-Lead - Lipid panel - TSH  4. Prediabetes  - EKG 12-Lead - Hemoglobin A1c - Insulin, random  5. Vitamin D deficiency  - VITAMIN D 25 Hydroxy   6. Screening for rectal cancer  - POC Hemoccult Bld/Stl   7. Vertigo due to brain injury ([place])  8. Screening for ischemic heart disease  - EKG 12-Lead  9. Medication management  - Urinalysis, Routine w reflex microscopic  - CBC with Differential/Platelet - BASIC METABOLIC PANEL WITH GFR - Hepatic function panel - Magnesium       Continue prudent diet as discussed, weight control, BP monitoring, regular exercise, and medications. Discussed med's effects and SE's. Screening labs and tests as requested with regular follow-up as recommended. Over 40 minutes of exam, counseling, chart review and high complex critical decision  making was performed.

## 2016-02-11 NOTE — Patient Instructions (Signed)

## 2016-02-12 LAB — HEMOGLOBIN A1C
HEMOGLOBIN A1C: 5.4 % (ref <5.7)
MEAN PLASMA GLUCOSE: 108 mg/dL

## 2016-02-12 LAB — MICROALBUMIN / CREATININE URINE RATIO
Creatinine, Urine: 400 mg/dL — ABNORMAL HIGH (ref 20–320)
MICROALB/CREAT RATIO: 4 ug/mg{creat} (ref <30)
Microalb, Ur: 1.5 mg/dL

## 2016-02-12 LAB — URINALYSIS, ROUTINE W REFLEX MICROSCOPIC
Bilirubin Urine: NEGATIVE
Glucose, UA: NEGATIVE
Hgb urine dipstick: NEGATIVE
Ketones, ur: NEGATIVE
LEUKOCYTES UA: NEGATIVE
NITRITE: NEGATIVE
SPECIFIC GRAVITY, URINE: 1.029 (ref 1.001–1.035)
pH: 5 (ref 5.0–8.0)

## 2016-02-12 LAB — VITAMIN D 25 HYDROXY (VIT D DEFICIENCY, FRACTURES): VIT D 25 HYDROXY: 32 ng/mL (ref 30–100)

## 2016-02-12 LAB — INSULIN, RANDOM: Insulin: 31.2 u[IU]/mL — ABNORMAL HIGH (ref 2.0–19.6)

## 2016-03-19 ENCOUNTER — Encounter: Payer: Self-pay | Admitting: Internal Medicine

## 2016-05-14 ENCOUNTER — Ambulatory Visit: Payer: Self-pay | Admitting: Internal Medicine

## 2016-07-04 NOTE — Progress Notes (Signed)
MEDICARE ANNUAL WELLNESS VISIT AND 3 MONTH FOLLOW UP  Assessment:   Essential hypertension - continue medications, DASH diet, exercise and monitor at home. Call if greater than 130/80.  -     CBC with Differential/Platelet -     BASIC METABOLIC PANEL WITH GFR -     Hepatic function panel -     TSH  Migraine without status migrainosus, not intractable, unspecified migraine type Normal neuro, avoid triggers  ASCVD (arteriosclerotic cardiovascular disease) Control blood pressure, cholesterol, glucose, increase exercise.   Uncomplicated asthma, unspecified asthma severity, unspecified whether persistent CTAB right now  Prediabetes Discussed general issues about diabetes pathophysiology and management., Educational material distributed., Suggested low cholesterol diet., Encouraged aerobic exercise., Discussed foot care., Reminded to get yearly retinal exam.  Mixed hyperlipidemia Discussed general issues about diabetes pathophysiology and management., Educational material distributed., Suggested low cholesterol diet., Encouraged aerobic exercise., Discussed foot care., Reminded to get yearly retinal exam. -     Lipid panel  Vertigo due to brain injury ([place]) -     diazepam (VALIUM) 5 MG tablet; Take 1/2 to 1 tab tid prn for anxiety.  Medication management -     Magnesium  Vitamin D deficiency -     VITAMIN D 25 Hydroxy (Vit-D Deficiency, Fractures)  Anemia, unspecified type ? Cause of RLS check labs -     Iron and TIBC -     Vitamin B12  Patient declines most exam/vaccines- states want to enjoy what times he has and would declines any treatmetns.   Over 40 minutes of exam, counseling, chart review and critical decision making was performed Future Appointments Date Time Provider [place]  08/14/2016 11:00 AM Unk Pinto, MD [facility] None  03/11/2017 9:00 AM Unk Pinto, MD [facility] None     Plan:   During the course of the visit the patient was  educated and counseled about appropriate screening and preventive services including:    Pneumococcal vaccine   Prevnar 13  Influenza vaccine  Td vaccine  Screening electrocardiogram  Bone densitometry screening  Colorectal cancer screening  Diabetes screening  Glaucoma screening  Nutrition counseling   Advanced directives: requested   Subjective:  Lori [name] is a 76 y.o. 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 She denies chest pain, shortness of breath, dizziness. She is on valium for vertigo and anxiety, increase anxiety for her daughter in law with stage 4 cancer, 3 kids.   She is on cholesterol medication and denies myalgias. Her cholesterol is at goal. The cholesterol last visit was:   Lab Results  Component Value Date   CHOL 151 02/11/2016   HDL 56 02/11/2016   LDLCALC 74 02/11/2016   TRIG 105 02/11/2016   CHOLHDL 2.7 02/11/2016    She has been working on diet and exercise for prediabetes, and denies polydipsia, polyuria and visual disturbances. Last A1C in the office was:  Lab Results  Component Value Date   HGBA1C 5.4 02/11/2016   Lab Results  Component Value Date   GFRNONAA 60 02/11/2016   Patient is on Vitamin D supplement.   Lab Results  Component Value Date   VD25OH 32 02/11/2016     BMI is Body mass index is 30.9 kg/m., she is working on diet and exercise. Wt Readings from Last 3 Encounters:  07/06/16 180 lb (81.6 kg)  02/11/16 182 lb 9.6 oz (82.8 kg)  08/07/15 181 lb (82.1 kg)    Medication Review: Current Outpatient Prescriptions on File Prior to Visit  Medication Sig Dispense Refill  . Cholecalciferol (VITAMIN D PO) Take 4,000 mg by mouth daily.    . Multiple Vitamins-Minerals (CENTRUM SILVER 50+WOMEN PO) Take by mouth daily.     No current facility-administered  medications on file prior to visit.     No Known Allergies  Current Problems (verified) Patient Active Problem List   Diagnosis Date Noted  . Vertigo due to brain injury ([place]) 02/06/2015  . BMI 31.0-31.9,adult 02/06/2015  . Medication management 11/18/2013  . Hyperlipidemia   . Prediabetes   . Vitamin D deficiency   . Asthma   . Hypertension   . Migraines   . ASCVD (arteriosclerotic cardiovascular disease)    Screening Tests Immunization History  Administered Date(s) Administered  . DT 01/24/2014  . Pneumococcal Polysaccharide-23 10/06/2011  . Td 04/23/2003   Preventative care: Last colonoscopy: 2013 Last mammogram: 09/2012 Last pap smear/pelvic exam: 2009 DEXA:2011  Prior vaccinations: TD  2015  Influenza: declines Pneumococcal: 2013 Prevnar13: declines Shingles/Zostavax: declines  Names of Other Physician/Practitioners you currently use: 1.  [facility] here for primary care 2. [place], eye doctor, last visit June 2017 3. Needs one, dentist, remote Patient Care Team: Unk Pinto, MD as PCP - General (Internal Medicine) [place] Dragon, MD (Inactive) as Consulting Physician (Gastroenterology) Clent Jacks, MD as Consulting Physician (Ophthalmology)  SURGICAL HISTORY She  has a past surgical history that includes Tubal ligation; Cerebral aneurysm repair; Cataract extraction w/ intraocular lens  implant, bilateral (2009); and Foot surgery (1993). FAMILY HISTORY Her family history includes Cancer in her father and mother; Diabetes in her mother; Hypertension in her mother. SOCIAL HISTORY She  reports that she quit smoking about 25 years ago. She has never used smokeless tobacco. She reports that she does not drink alcohol or use drugs.   MEDICARE WELLNESS OBJECTIVES: Physical activity: Current Exercise Habits: The patient does not participate in regular exercise at present (works with dogs and goats) Cardiac risk factors:  Cardiac Risk Factors include: advanced age (>44men, >48 women);dyslipidemia;hypertension;obesity (BMI >30kg/m2);sedentary lifestyle Depression/mood screen:   Depression screen [facility] 2/9 07/06/2016  Decreased Interest 0  Down, Depressed, Hopeless 0  PHQ - 2 Score 0    ADLs:  In your present state of health, do you have any difficulty performing the following activities: 07/06/2016 02/11/2016  Hearing? N N  Vision? N N  Difficulty concentrating or making decisions? N N  Walking or climbing stairs? N N  Dressing or bathing? N N  Doing errands, shopping? N N  Some recent data might be hidden     Cognitive Testing  Alert? Yes  Normal Appearance?Yes  Oriented to person? Yes  Place? Yes   Time? Yes  Recall of three objects?  Yes  Can perform simple calculations? Yes  Displays appropriate judgment?Yes  Can read the correct time from a watch face?Yes  EOL planning: Does Patient Have a Medical Advance Directive?: No Would patient like information on creating a medical advance directive?: Yes (ED - Information included in AVS)  Review of Systems  Constitutional: Negative.   HENT: Negative.   Eyes: Negative.   Respiratory: Negative.   Cardiovascular: Negative.   Gastrointestinal: Negative.   Genitourinary: Negative.   Musculoskeletal: Negative.   Skin: Negative.      Objective:  Today's Vitals   07/06/16 1113  BP: 124/80  Pulse: 63  Resp: 14  Temp: 97.4 F (36.3 C)  SpO2: 97%  Weight: 180 lb (81.6 kg)  Height: 5\' 4"  (1.626 m)  PainSc: 0-No pain   Body mass index is 30.9 kg/m.  General appearance: alert, no distress, WD/WN, female HEENT: normocephalic, sclerae anicteric, TMs pearly, nares patent, no discharge or erythema, pharynx normal Oral cavity: MMM, no lesions Neck: supple, no lymphadenopathy, no thyromegaly, no masses Heart: RRR, normal S1, S2, no murmurs Lungs: CTA bilaterally, no wheezes, rhonchi, or rales Abdomen: +bs, soft, non tender, non distended, no  masses, no hepatomegaly, no splenomegaly Musculoskeletal: nontender, no swelling, no obvious deformity Extremities: no edema, no cyanosis, no clubbing Pulses: 2+ symmetric, upper and lower extremities, normal cap refill Neurological: alert, oriented x 3, CN2-12 intact, strength normal upper extremities and lower extremities, sensation normal throughout, DTRs 2+ throughout, no cerebellar signs, gait normal Psychiatric: normal affect, behavior normal, pleasant   Medicare Attestation I have personally reviewed: The patient's medical and social history Their use of alcohol, tobacco or illicit drugs Their current medications and supplements The patient's functional ability including ADLs,fall risks, home safety risks, cognitive, and hearing and visual impairment Diet and physical activities Evidence for depression or mood disorders  The patient's weight, height, BMI, and visual acuity have been recorded in the chart.  I have made referrals, counseling, and provided education to the patient based on review of the above and I have provided the patient with a written personalized care plan for preventive services.     Vicie Mutters, PA-C   07/06/2016

## 2016-07-06 ENCOUNTER — Ambulatory Visit (INDEPENDENT_AMBULATORY_CARE_PROVIDER_SITE_OTHER): Payer: PPO | Admitting: Physician Assistant

## 2016-07-06 ENCOUNTER — Encounter: Payer: Self-pay | Admitting: Physician Assistant

## 2016-07-06 ENCOUNTER — Ambulatory Visit: Payer: Self-pay | Admitting: Internal Medicine

## 2016-07-06 VITALS — BP 124/80 | HR 63 | Temp 97.4°F | Resp 14 | Ht 64.0 in | Wt 180.0 lb

## 2016-07-06 DIAGNOSIS — Z0001 Encounter for general adult medical examination with abnormal findings: Secondary | ICD-10-CM

## 2016-07-06 DIAGNOSIS — J45909 Unspecified asthma, uncomplicated: Secondary | ICD-10-CM | POA: Diagnosis not present

## 2016-07-06 DIAGNOSIS — R7303 Prediabetes: Secondary | ICD-10-CM | POA: Diagnosis not present

## 2016-07-06 DIAGNOSIS — I10 Essential (primary) hypertension: Secondary | ICD-10-CM

## 2016-07-06 DIAGNOSIS — R42 Dizziness and giddiness: Secondary | ICD-10-CM | POA: Diagnosis not present

## 2016-07-06 DIAGNOSIS — R6889 Other general symptoms and signs: Secondary | ICD-10-CM

## 2016-07-06 DIAGNOSIS — Z79899 Other long term (current) drug therapy: Secondary | ICD-10-CM

## 2016-07-06 DIAGNOSIS — I2510 Atherosclerotic heart disease of native coronary artery without angina pectoris: Secondary | ICD-10-CM

## 2016-07-06 DIAGNOSIS — E782 Mixed hyperlipidemia: Secondary | ICD-10-CM | POA: Diagnosis not present

## 2016-07-06 DIAGNOSIS — G43909 Migraine, unspecified, not intractable, without status migrainosus: Secondary | ICD-10-CM | POA: Diagnosis not present

## 2016-07-06 DIAGNOSIS — D649 Anemia, unspecified: Secondary | ICD-10-CM | POA: Diagnosis not present

## 2016-07-06 DIAGNOSIS — E559 Vitamin D deficiency, unspecified: Secondary | ICD-10-CM | POA: Diagnosis not present

## 2016-07-06 DIAGNOSIS — Z0000 Encounter for general adult medical examination without abnormal findings: Secondary | ICD-10-CM

## 2016-07-06 DIAGNOSIS — S069XAA Unspecified intracranial injury with loss of consciousness status unknown, initial encounter: Secondary | ICD-10-CM

## 2016-07-06 DIAGNOSIS — S069X9A Unspecified intracranial injury with loss of consciousness of unspecified duration, initial encounter: Secondary | ICD-10-CM

## 2016-07-06 LAB — HEPATIC FUNCTION PANEL
ALBUMIN: 4.1 g/dL (ref 3.6–5.1)
ALT: 11 U/L (ref 6–29)
AST: 16 U/L (ref 10–35)
Alkaline Phosphatase: 86 U/L (ref 33–130)
BILIRUBIN INDIRECT: 0.3 mg/dL (ref 0.2–1.2)
Bilirubin, Direct: 0.1 mg/dL (ref <=0.2)
TOTAL PROTEIN: 6.2 g/dL (ref 6.1–8.1)
Total Bilirubin: 0.4 mg/dL (ref 0.2–1.2)

## 2016-07-06 LAB — BASIC METABOLIC PANEL WITH GFR
BUN: 22 mg/dL (ref 7–25)
CHLORIDE: 110 mmol/L (ref 98–110)
CO2: 24 mmol/L (ref 20–31)
Calcium: 9.2 mg/dL (ref 8.6–10.4)
Creat: 0.8 mg/dL (ref 0.60–0.93)
GFR, EST NON AFRICAN AMERICAN: 73 mL/min (ref >=60)
GFR, Est African American: 85 mL/min (ref >=60)
GLUCOSE: 100 mg/dL — AB (ref 65–99)
POTASSIUM: 4.4 mmol/L (ref 3.5–5.3)
Sodium: 144 mmol/L (ref 135–146)

## 2016-07-06 LAB — CBC WITH DIFFERENTIAL/PLATELET
Basophils Absolute: 48 cells/uL (ref 0–200)
Basophils Relative: 1 %
Eosinophils Absolute: 48 cells/uL (ref 15–500)
Eosinophils Relative: 1 %
HCT: 41.3 % (ref 35.0–45.0)
Hemoglobin: 13.8 g/dL (ref 11.7–15.5)
Lymphocytes Relative: 29 %
Lymphs Abs: 1392 cells/uL (ref 850–3900)
MCH: 30.2 pg (ref 27.0–33.0)
MCHC: 33.4 g/dL (ref 32.0–36.0)
MCV: 90.4 fL (ref 80.0–100.0)
MPV: 12.4 fL (ref 7.5–12.5)
Monocytes Absolute: 528 cells/uL (ref 200–950)
Monocytes Relative: 11 %
Neutro Abs: 2784 cells/uL (ref 1500–7800)
Neutrophils Relative %: 58 %
Platelets: 176 10*3/uL (ref 140–400)
RBC: 4.57 MIL/uL (ref 3.80–5.10)
RDW: 14.8 % (ref 11.0–15.0)
WBC: 4.8 10*3/uL (ref 3.8–10.8)

## 2016-07-06 LAB — LIPID PANEL
Cholesterol: 131 mg/dL (ref <200)
HDL: 56 mg/dL (ref >50)
LDL Cholesterol: 60 mg/dL (ref <100)
Total CHOL/HDL Ratio: 2.3 Ratio (ref <5.0)
Triglycerides: 75 mg/dL (ref <150)
VLDL: 15 mg/dL (ref <30)

## 2016-07-06 LAB — IRON AND TIBC
%SAT: 19 % (ref 11–50)
Iron: 60 ug/dL (ref 45–160)
TIBC: 324 ug/dL (ref 250–450)
UIBC: 264 ug/dL (ref 125–400)

## 2016-07-06 LAB — TSH: TSH: 2.98 mIU/L

## 2016-07-06 MED ORDER — DIAZEPAM 5 MG PO TABS: ORAL_TABLET | ORAL | 1 refills | Status: DC

## 2016-07-06 NOTE — Patient Instructions (Addendum)
Tumeric with black pepper extract is a great natural antiinflammatory that helps with arthritis and aches and pain. Can get from [employer] or any health food store. Need to take at least 800mg  twice a day with food.    Varicose Veins Varicose veins are veins that have become enlarged and twisted. CAUSES This condition is the result of valves in the veins not working properly. Valves in the veins help return blood from the leg to the heart. When your calf muscles squeeze, the blood moves up your leg then the valves close and this continues until the blood gets back to your heart.  If these valves are damaged, blood flows backwards and backs up into the veins in the leg near the skin OR if your are sitting/standing for a long time without using your calf muscles the blood will back up into the veins in your legs. This causes the veins to become larger. People who are on their feet a lot, sit a lot without walking (like on a plane, at a desk, or in a car), who are pregnant, or who are overweight are more likely to develop varicose veins. SYMPTOMS   Bulging, twisted-appearing, bluish veins, most commonly found on the legs.  Leg pain or a feeling of heaviness. These symptoms may be worse at the end of the day.  Leg swelling.  Skin color changes. DIAGNOSIS  Varicose veins can usually be diagnosed with an exam of your legs by your caregiver. He or she may recommend an ultrasound of your leg veins. TREATMENT  Most varicose veins can be treated at home.However, other treatments are available for people who have persistent symptoms or who want to treat the cosmetic appearance of the varicose veins. But this is only cosmetic and they will return if not properly treated. These include:  Laser treatment of very small varicose veins.  Medicine that is shot (injected) into the vein. This medicine hardens the walls of the vein and closes off the vein. This treatment is called sclerotherapy. Afterwards, you may  need to wear clothing or bandages that apply pressure.  Surgery. HOME CARE INSTRUCTIONS   Do not stand or sit in one position for long periods of time. Do not sit with your legs crossed. Rest with your legs raised during the day.  Your legs have to be higher than your heart so that gravity will force the valves to open, so please really elevate your legs.   Wear elastic stockings or support hose. Do not wear other tight, encircling garments around the legs, pelvis, or waist.  [employer]  has a wide variety of well priced compression stockings. [place], [place] [id number] [id number] [place] ARE COPPER INFUSED COMPRESSION SOCKS AT [facility] OR [facility]  Walk as much as possible to increase blood flow.  Raise the foot of your bed at night with 2-inch blocks.  If you get a cut in the skin over the vein and the vein bleeds, lie down with your leg raised and press on it with a clean cloth until the bleeding stops. Then place a bandage (dressing) on the cut. See your caregiver if it continues to bleed or needs stitches. SEEK MEDICAL CARE IF:   The skin around your ankle starts to break down.  You have pain, redness, tenderness, or hard swelling developing in your leg over a vein.  You are uncomfortable due to leg pain. Document Released: 12/10/2004 Document Revised: 05/25/2011 Document Reviewed: 04/28/2010 ExitCare Patient  Information 2014 [place], [place].  Restless Legs Syndrome Restless legs syndrome is a condition that causes uncomfortable feelings or sensations in the legs, especially while sitting or lying down. The sensations usually cause an overwhelming urge to move the legs. The arms can also sometimes be affected. The condition can range from mild to severe. The symptoms often interfere with a person's ability to sleep. What are the causes? The cause of this condition is not known. What increases the risk? This condition is more likely to develop in:  People  who are older than age 21.  Pregnant women. In general, restless legs syndrome is more common in women than in men.  People who have a family history of the condition.  People who have certain medical conditions, such as iron deficiency, kidney disease, Parkinson disease, or nerve damage.  People who take certain medicines, such as medicines for high blood pressure, nausea, colds, allergies, depression, and some heart conditions. What are the signs or symptoms? The main symptom of this condition is uncomfortable sensations in the legs. These sensations may be:  Described as pulling, tingling, prickling, throbbing, crawling, or burning.  Worse while you are sitting or lying down.  Worse during periods of rest or inactivity.  Worse at night, often interfering with your sleep.  Accompanied by a very strong urge to move your legs.  Temporarily relieved by movement of your legs. The sensations usually affect both sides of the body. The arms can also be affected, but this is rare. People who have this condition often have tiredness during the day because of their lack of sleep at night. How is this diagnosed? This condition may be diagnosed based on your description of the symptoms. You may also have tests, including blood tests, to check for other conditions that may lead to your symptoms. In some cases, you may be asked to spend some time in a sleep lab so your sleeping can be monitored. How is this treated? Treatment for this condition is focused on managing the symptoms. Treatment may include:  Self-help and lifestyle changes.  Medicines. Follow these instructions at home:  Take medicines only as directed by your health care provider.  Try these methods to get temporary relief from the uncomfortable sensations:  Massage your legs.  Walk or stretch.  Take a cold or hot bath.  Practice good sleep habits. For example, go to bed and get up at the same time every day.  Exercise  regularly.  Practice ways of relaxing, such as yoga or meditation.  Avoid caffeine and alcohol.  Do not use any tobacco products, including cigarettes, chewing tobacco, or electronic cigarettes. If you need help quitting, ask your health care provider.  Keep all follow-up visits as directed by your health care provider. This is important. Contact a health care provider if: Your symptoms do not improve with treatment, or they get worse. This information is not intended to replace advice given to you by your health care provider. Make sure you discuss any questions you have with your health care provider. Document Released: 02/20/2002 Document Revised: 08/08/2015 Document Reviewed: 02/26/2014 Elsevier Interactive Patient Education  2017 [employer].

## 2016-07-06 NOTE — Progress Notes (Signed)
VALIUM WAS CALLED INTO PHARMACY ON 23rd April 2018 @ 12:08PM BY DD

## 2016-07-07 LAB — VITAMIN B12: VITAMIN B 12: 351 pg/mL (ref 200–1100)

## 2016-07-07 LAB — VITAMIN D 25 HYDROXY (VIT D DEFICIENCY, FRACTURES): VIT D 25 HYDROXY: 51 ng/mL (ref 30–100)

## 2016-07-07 LAB — MAGNESIUM: Magnesium: 2.2 mg/dL (ref 1.5–2.5)

## 2016-07-07 NOTE — Progress Notes (Signed)
LVM for pt to return office call for LAB results.

## 2016-07-22 DIAGNOSIS — Z961 Presence of intraocular lens: Secondary | ICD-10-CM | POA: Diagnosis not present

## 2016-08-14 ENCOUNTER — Ambulatory Visit: Payer: Self-pay | Admitting: Internal Medicine

## 2016-10-06 ENCOUNTER — Encounter: Payer: Self-pay | Admitting: Internal Medicine

## 2016-10-06 ENCOUNTER — Ambulatory Visit (INDEPENDENT_AMBULATORY_CARE_PROVIDER_SITE_OTHER): Payer: PPO | Admitting: Internal Medicine

## 2016-10-06 VITALS — BP 132/74 | HR 63 | Temp 97.2°F | Resp 16 | Ht 64.0 in | Wt 177.2 lb

## 2016-10-06 DIAGNOSIS — Z79899 Other long term (current) drug therapy: Secondary | ICD-10-CM

## 2016-10-06 DIAGNOSIS — E782 Mixed hyperlipidemia: Secondary | ICD-10-CM

## 2016-10-06 DIAGNOSIS — R7303 Prediabetes: Secondary | ICD-10-CM

## 2016-10-06 DIAGNOSIS — E559 Vitamin D deficiency, unspecified: Secondary | ICD-10-CM

## 2016-10-06 DIAGNOSIS — I10 Essential (primary) hypertension: Secondary | ICD-10-CM

## 2016-10-06 LAB — CBC WITH DIFFERENTIAL/PLATELET
BASOS ABS: 56 {cells}/uL (ref 0–200)
Basophils Relative: 1 %
EOS PCT: 1 %
Eosinophils Absolute: 56 cells/uL (ref 15–500)
HCT: 42.3 % (ref 35.0–45.0)
HEMOGLOBIN: 13.9 g/dL (ref 11.7–15.5)
LYMPHS ABS: 1120 {cells}/uL (ref 850–3900)
Lymphocytes Relative: 20 %
MCH: 29.8 pg (ref 27.0–33.0)
MCHC: 32.9 g/dL (ref 32.0–36.0)
MCV: 90.6 fL (ref 80.0–100.0)
MPV: 12.5 fL (ref 7.5–12.5)
Monocytes Absolute: 672 cells/uL (ref 200–950)
Monocytes Relative: 12 %
NEUTROS ABS: 3696 {cells}/uL (ref 1500–7800)
Neutrophils Relative %: 66 %
Platelets: 209 10*3/uL (ref 140–400)
RBC: 4.67 MIL/uL (ref 3.80–5.10)
RDW: 14.8 % (ref 11.0–15.0)
WBC: 5.6 10*3/uL (ref 3.8–10.8)

## 2016-10-06 LAB — BASIC METABOLIC PANEL WITH GFR
BUN: 17 mg/dL (ref 7–25)
CHLORIDE: 107 mmol/L (ref 98–110)
CO2: 26 mmol/L (ref 20–31)
CREATININE: 0.85 mg/dL (ref 0.60–0.93)
Calcium: 8.9 mg/dL (ref 8.6–10.4)
GFR, EST NON AFRICAN AMERICAN: 68 mL/min (ref >=60)
GFR, Est African American: 78 mL/min (ref >=60)
Glucose, Bld: 95 mg/dL (ref 65–99)
Potassium: 4.1 mmol/L (ref 3.5–5.3)
SODIUM: 143 mmol/L (ref 135–146)

## 2016-10-06 LAB — LIPID PANEL
CHOL/HDL RATIO: 2.8 ratio (ref <5.0)
Cholesterol: 154 mg/dL (ref <200)
HDL: 56 mg/dL (ref >50)
LDL CALC: 78 mg/dL (ref <100)
Triglycerides: 102 mg/dL (ref <150)
VLDL: 20 mg/dL (ref <30)

## 2016-10-06 LAB — HEPATIC FUNCTION PANEL
ALK PHOS: 102 U/L (ref 33–130)
ALT: 10 U/L (ref 6–29)
AST: 13 U/L (ref 10–35)
Albumin: 4.2 g/dL (ref 3.6–5.1)
BILIRUBIN DIRECT: 0.1 mg/dL (ref <=0.2)
BILIRUBIN INDIRECT: 0.5 mg/dL (ref 0.2–1.2)
Total Bilirubin: 0.6 mg/dL (ref 0.2–1.2)
Total Protein: 6.2 g/dL (ref 6.1–8.1)

## 2016-10-06 NOTE — Patient Instructions (Signed)

## 2016-10-06 NOTE — Progress Notes (Signed)
This delightful  76 y.o. DWF presents for 3 month follow up with Hypertension, Hyperlipidemia, Pre-Diabetes and Vitamin D Deficiency. In 2001, patient had Brain Surgery for an aneurysm and reports Chronic Vertigo since which limits her activities.      Patient is treated for HTN (2009) & BP has been controlled at home. Today's BP is at goal - 132/74.  Patient had a FALSE POSITIVE Stress Test with negative Heart Cath in 2009.  Patient has had no complaints of any cardiac type chest pain, palpitations, dyspnea/orthopnea/PND, dizziness, claudication, or dependent edema.     Hyperlipidemia is controlled with diet & meds. Patient denies myalgias or other med SE's. Last Lipids were at goal: Lab Results  Component Value Date   CHOL 131 07/06/2016   HDL 56 07/06/2016   LDLCALC 60 07/06/2016   TRIG 75 07/06/2016   CHOLHDL 2.3 07/06/2016      Also, the patient has history of PreDiabetes (A1c 5.9% in 2013) and has had no symptoms of reactive hypoglycemia, diabetic polys, paresthesias or visual blurring.  Last A1c was at goal: Lab Results  Component Value Date   HGBA1C 5.4 02/11/2016      Further, the patient also has history of Vitamin D Deficiency("11" in 2008) and supplements vitamin D without any suspected side-effects. Last vitamin D was still low (goal 70-[100):   Lab Results  Component Value Date   VD25OH 51 07/06/2016   Current Outpatient Prescriptions on File Prior to Visit  Medication Sig  . Cholecalciferol (VITAMIN D PO) Take 4,000 mg by mouth daily.  . diazepam (VALIUM) 5 MG tablet Take 1/2 to 1 tab tid prn for anxiety.  . Multiple Vitamins-Minerals (CENTRUM SILVER 50+WOMEN PO) Take by mouth daily.   No current facility-administered medications on file prior to visit.    No Known Allergies PMHx:   Past Medical History:  Diagnosis Date  . Arthritis   . ASCVD (arteriosclerotic cardiovascular disease)   . Asthma   . Hyperlipidemia   . Hypertension   . Migraines   .  Prediabetes   . Vertigo   . Vitamin D deficiency    Immunization History  Administered Date(s) Administered  . DT 01/24/2014  . Pneumococcal Polysaccharide-23 10/06/2011  . Td 04/23/2003   Past Surgical History:  Procedure Laterality Date  . CATARACT EXTRACTION W/ INTRAOCULAR LENS  IMPLANT, BILATERAL  2009  . CEREBRAL ANEURYSM REPAIR    . FOOT SURGERY  1993  . TUBAL LIGATION     FHx:    Reviewed / unchanged  SHx:    Reviewed / unchanged  Systems Review:  Constitutional: Denies fever, chills, wt changes, headaches, insomnia, fatigue, night sweats, change in appetite. Eyes: Denies redness, blurred vision, diplopia, discharge, itchy, watery eyes.  ENT: Denies discharge, congestion, post nasal drip, epistaxis, sore throat, earache, hearing loss, dental pain, tinnitus, vertigo, sinus pain, snoring.  CV: Denies chest pain, palpitations, irregular heartbeat, syncope, dyspnea, diaphoresis, orthopnea, PND, claudication or edema. Respiratory: denies cough, dyspnea, DOE, pleurisy, hoarseness, laryngitis, wheezing.  Gastrointestinal: Denies dysphagia, odynophagia, heartburn, reflux, water brash, abdominal pain or cramps, nausea, vomiting, bloating, diarrhea, constipation, hematemesis, melena, hematochezia  or hemorrhoids. Genitourinary: Denies dysuria, frequency, urgency, nocturia, hesitancy, discharge, hematuria or flank pain. Musculoskeletal: Denies arthralgias, myalgias, stiffness, jt. swelling, pain, limping or strain/sprain.  Skin: Denies pruritus, rash, hives, warts, acne, eczema or change in skin lesion(s). Neuro: No weakness, tremor, incoordination, spasms, paresthesia or pain. Psychiatric: Denies confusion, memory loss or sensory loss. Endo: Denies  change in weight, skin or hair change.  Heme/Lymph: No excessive bleeding, bruising or enlarged lymph nodes.  Physical Exam  BP 132/74   Pulse 63   Temp (!) 97.2 F (36.2 C)   Resp 16   Ht 5\' 4"  (1.626 m)   Wt 177 lb 3.2 oz (80.4  kg)   BMI 30.42 kg/m   Appears well nourished, well groomed  and in no distress.  Eyes: PERRLA, EOMs, conjunctiva no swelling or erythema. Sinuses: No frontal/maxillary tenderness ENT/Mouth: EAC's clear, TM's nl w/o erythema, bulging. Nares clear w/o erythema, swelling, exudates. Oropharynx clear without erythema or exudates. Oral hygiene is good. Tongue normal, non obstructing. Hearing intact.  Neck: Supple. Thyroid nl. Car 2+/2+ without bruits, nodes or JVD. Chest: Respirations nl with BS clear & equal w/o rales, rhonchi, wheezing or stridor.  Cor: Heart sounds normal w/ regular rate and rhythm without sig. murmurs, gallops, clicks or rubs. Peripheral pulses normal and equal  without edema.  Abdomen: Soft & bowel sounds normal. Non-tender w/o guarding, rebound, hernias, masses or organomegaly.  Lymphatics: Unremarkable.  Musculoskeletal: Full ROM all peripheral extremities, joint stability, 5/5 strength and normal gait.  Skin: Warm, dry without exposed rashes, lesions or ecchymosis apparent.  Neuro: Cranial nerves intact, reflexes equal bilaterally. Sensory-motor testing grossly intact. Tendon reflexes grossly intact.  Pysch: Alert & oriented x 3.  Insight and judgement nl & appropriate. No ideations.  Assessment and Plan:    1. Essential hypertension  - Continue medication, monitor blood pressure at home.  - Continue DASH diet. Reminder to go to the ER if any CP,  SOB, nausea, dizziness, severe HA, changes vision/speech. - CBC with Differential/Platelet - BASIC METABOLIC PANEL WITH GFR - Magnesium - TSH  2. Hyperlipidemia, mixed  - Continue diet/meds, exercise,& lifestyle modifications.  - Continue monitor periodic cholesterol/liver & renal functions  - Hepatic function panel - Lipid panel - TSH  3. Prediabetes  - Hemoglobin A1c - Insulin, random  4. Vitamin D deficiency  - VITAMIN D 25 Hydroxy   5. Medication management  - CBC with Differential/Platelet -  BASIC METABOLIC PANEL WITH GFR - Hepatic function panel - Magnesium - Lipid panel - TSH - Hemoglobin A1c - Insulin, random - VITAMIN D 25 Hydroxy   - Continue diet, exercise, lifestyle modifications.  - Monitor appropriate labs. - Continue supplementation.      Discussed  regular exercise, BP monitoring, weight control to achieve/maintain BMI less than 25 and discussed med and SE's. Recommended labs to assess and monitor clinical status with further disposition pending results of labs. Over 30 minutes of exam, counseling, chart review was performed.

## 2016-10-07 LAB — TSH: TSH: 4.15 m[IU]/L

## 2016-10-07 LAB — HEMOGLOBIN A1C
Hgb A1c MFr Bld: 5.4 %
Mean Plasma Glucose: 108 mg/dL

## 2016-10-07 LAB — VITAMIN D 25 HYDROXY (VIT D DEFICIENCY, FRACTURES): Vit D, 25-Hydroxy: 32 ng/mL (ref 30–100)

## 2016-10-07 LAB — INSULIN, RANDOM: Insulin: 6.3 u[IU]/mL (ref 2.0–19.6)

## 2016-10-07 LAB — MAGNESIUM: Magnesium: 2.3 mg/dL (ref 1.5–2.5)

## 2016-12-23 DIAGNOSIS — I771 Stricture of artery: Secondary | ICD-10-CM

## 2017-01-08 ENCOUNTER — Encounter: Payer: Self-pay | Admitting: Physician Assistant

## 2017-03-11 ENCOUNTER — Encounter: Payer: Self-pay | Admitting: Internal Medicine

## 2017-04-09 ENCOUNTER — Encounter: Payer: Self-pay | Admitting: Internal Medicine

## 2017-04-09 ENCOUNTER — Ambulatory Visit (INDEPENDENT_AMBULATORY_CARE_PROVIDER_SITE_OTHER): Payer: PPO | Admitting: Internal Medicine

## 2017-04-09 VITALS — BP 106/64 | HR 84 | Temp 97.5°F | Resp 18 | Ht 63.5 in | Wt 173.8 lb

## 2017-04-09 DIAGNOSIS — R7303 Prediabetes: Secondary | ICD-10-CM

## 2017-04-09 DIAGNOSIS — Z1211 Encounter for screening for malignant neoplasm of colon: Secondary | ICD-10-CM

## 2017-04-09 DIAGNOSIS — R7309 Other abnormal glucose: Secondary | ICD-10-CM

## 2017-04-09 DIAGNOSIS — E782 Mixed hyperlipidemia: Secondary | ICD-10-CM | POA: Diagnosis not present

## 2017-04-09 DIAGNOSIS — Z1212 Encounter for screening for malignant neoplasm of rectum: Secondary | ICD-10-CM

## 2017-04-09 DIAGNOSIS — E559 Vitamin D deficiency, unspecified: Secondary | ICD-10-CM

## 2017-04-09 DIAGNOSIS — Z79899 Other long term (current) drug therapy: Secondary | ICD-10-CM | POA: Diagnosis not present

## 2017-04-09 DIAGNOSIS — Z0001 Encounter for general adult medical examination with abnormal findings: Secondary | ICD-10-CM

## 2017-04-09 DIAGNOSIS — Z136 Encounter for screening for cardiovascular disorders: Secondary | ICD-10-CM

## 2017-04-09 DIAGNOSIS — S069X9A Unspecified intracranial injury with loss of consciousness of unspecified duration, initial encounter: Secondary | ICD-10-CM

## 2017-04-09 DIAGNOSIS — R42 Dizziness and giddiness: Secondary | ICD-10-CM

## 2017-04-09 DIAGNOSIS — Z0000 Encounter for general adult medical examination without abnormal findings: Secondary | ICD-10-CM

## 2017-04-09 DIAGNOSIS — I10 Essential (primary) hypertension: Secondary | ICD-10-CM | POA: Diagnosis not present

## 2017-04-09 DIAGNOSIS — S069XAA Unspecified intracranial injury with loss of consciousness status unknown, initial encounter: Secondary | ICD-10-CM

## 2017-04-09 NOTE — Patient Instructions (Addendum)
Atrial Fibrillation  Atrial fibrillation is a type of irregular or rapid heartbeat (arrhythmia). In atrial fibrillation, the heart quivers continuously in a chaotic pattern. This occurs when parts of the heart receive disorganized signals that make the heart unable to pump blood normally. This can increase the risk for stroke, heart failure, and other heart-related conditions. There are different types of atrial fibrillation, including:  Paroxysmal atrial fibrillation. This type starts suddenly, and it usually stops on its own shortly after it starts.  Persistent atrial fibrillation. This type often lasts longer than a week. It may stop on its own or with treatment.  Long-lasting persistent atrial fibrillation. This type lasts longer than 12 months.  Permanent atrial fibrillation. This type does not go away.  Talk with your health care provider to learn about the type of atrial fibrillation that you have. What are the causes? This condition is caused by some heart-related conditions or procedures, including:  A heart attack.  Coronary artery disease.  Heart failure.  Heart valve conditions.  High blood pressure.  Inflammation of the sac that surrounds the heart (pericarditis).  Heart surgery.  Certain heart rhythm disorders, such as Wolf-Parkinson-White syndrome.  Other causes include:  Pneumonia.  Obstructive sleep apnea.  Blockage of an artery in the lungs (pulmonary embolism, or PE).  Lung cancer.  Chronic lung disease.  Thyroid problems, especially if the thyroid is overactive (hyperthyroidism).  Caffeine.  Excessive alcohol use or illegal drug use.  Use of some medicines, including certain decongestants and diet pills.  Sometimes, the cause cannot be found. What increases the risk? This condition is more likely to develop in:  People who are older in age.  People who smoke.  People who have diabetes mellitus.  People who are overweight  (obese).  Athletes who exercise vigorously.  What are the signs or symptoms? Symptoms of this condition include:  A feeling that your heart is beating rapidly or irregularly.  A feeling of discomfort or pain in your chest.  Shortness of breath.  Sudden light-headedness or weakness.  Getting tired easily during exercise.  In some cases, there are no symptoms. How is this diagnosed? Your health care provider may be able to detect atrial fibrillation when taking your pulse. If detected, this condition may be diagnosed with:  An electrocardiogram (ECG).  A Holter monitor test that records your heartbeat patterns over a 24-hour period.  Transthoracic echocardiogram (TTE) to evaluate how blood flows through your heart.  Transesophageal echocardiogram (TEE) to view more detailed images of your heart.  A stress test.  Imaging tests, such as a CT scan or chest X-ray.  Blood tests.  How is this treated? The main goals of treatment are to prevent blood clots from forming and to keep your heart beating at a normal rate and rhythm. The type of treatment that you receive depends on many factors, such as your underlying medical conditions and how you feel when you are experiencing atrial fibrillation. This condition may be treated with:  Medicine to slow down the heart rate, bring the heart's rhythm back to normal, or prevent clots from forming.  Electrical cardioversion. This is a procedure that resets your heart's rhythm by delivering a controlled, low-energy shock to the heart through your skin.  Different types of ablation, such as catheter ablation, catheter ablation with pacemaker, or surgical ablation. These procedures destroy the heart tissues that send abnormal signals. When the pacemaker is used, it is placed under your skin to help your heart beat  in a regular rhythm.  Follow these instructions at home:  Take over-the counter and prescription medicines only as told by your  health care provider.  If your health care provider prescribed a blood-thinning medicine (anticoagulant), take it exactly as told. Taking too much blood-thinning medicine can cause bleeding. If you do not take enough blood-thinning medicine, you will not have the protection that you need against stroke and other problems.  Do not use tobacco products, including cigarettes, chewing tobacco, and e-cigarettes. If you need help quitting, ask your health care provider.  If you have obstructive sleep apnea, manage your condition as told by your health care provider.  Do not drink alcohol.  Do not drink beverages that contain caffeine, such as coffee, soda, and tea.  Maintain a healthy weight. Do not use diet pills unless your health care provider approves. Diet pills may make heart problems worse.  Follow diet instructions as told by your health care provider.  Exercise regularly as told by your health care provider.  Keep all follow-up visits as told by your health care provider. This is important. How is this prevented?  Avoid drinking beverages that contain caffeine or alcohol.  Avoid certain medicines, especially medicines that are used for breathing problems.  Avoid certain herbs and herbal medicines, such as those that contain ephedra or ginseng.  Do not use illegal drugs, such as cocaine and amphetamines.  Do not smoke.  Manage your high blood pressure. Contact a health care provider if:  You notice a change in the rate, rhythm, or strength of your heartbeat.  You are taking an anticoagulant and you notice increased bruising.  You tire more easily when you exercise or exert yourself. Get help right away if:  You have chest pain, abdominal pain, sweating, or weakness.  You feel nauseous.  You notice blood in your vomit, bowel movement, or urine.  You have shortness of breath.  You suddenly have swollen feet and ankles.  You feel dizzy.  You have sudden weakness or  numbness of the face, arm, or leg, especially on one side of the body.  You have trouble speaking, trouble understanding, or both (aphasia).  Your face or your eyelid droops on one side. These symptoms may represent a serious problem that is an emergency. Do not wait to see if the symptoms will go away. Get medical help right away. Call your local emergency services (911 in the [place] Do not drive yourself to the hospital. This information is not intended to replace advice given to you by your health care provider. Make sure you discuss any questions you have with your health care provider. Document Released: 03/02/2005 Document Revised: 07/10/2015 Document Reviewed: 06/27/2014 Elsevier Interactive Patient Education  2018 [place].  +++++++++++++++++++++++++++++ Preventive Care for Adults  A healthy lifestyle and preventive care can promote health and wellness. Preventive health guidelines for women include the following key practices.  A routine yearly physical is a good way to check with your health care provider about your health and preventive screening. It is a chance to share any concerns and updates on your health and to receive a thorough exam.  Visit your dentist for a routine exam and preventive care every 6 months. Brush your teeth twice a day and floss once a day. Good oral hygiene prevents tooth decay and gum disease.  The frequency of eye exams is based on your age, health, family medical history, use of contact lenses, and other factors. Follow your health care provider's  recommendations for frequency of eye exams.  Eat a healthy diet. Foods like vegetables, fruits, whole grains, low-fat dairy products, and lean protein foods contain the nutrients you need without too many calories. Decrease your intake of foods high in solid fats, added sugars, and salt. Eat the right amount of calories for you.Get information about a proper diet from your health care provider, if  necessary.  Regular physical exercise is one of the most important things you can do for your health. Most adults should get at least 150 minutes of moderate-intensity exercise (any activity that increases your heart rate and causes you to sweat) each week. In addition, most adults need muscle-strengthening exercises on 2 or more days a week.  Maintain a healthy weight. The body mass index (BMI) is a screening tool to identify possible weight problems. It provides an estimate of body fat based on height and weight. Your health care provider can find your BMI and can help you achieve or maintain a healthy weight.For adults 20 years and older:  A BMI below 18.5 is considered underweight.  A BMI of 18.5 to 24.9 is normal.  A BMI of 25 to 29.9 is considered overweight.  A BMI of 30 and above is considered obese.  Maintain normal blood lipids and cholesterol levels by exercising and minimizing your intake of saturated fat. Eat a balanced diet with plenty of fruit and vegetables. If your lipid or cholesterol levels are high, you are over 50, or you are at high risk for heart disease, you may need your cholesterol levels checked more frequently.Ongoing high lipid and cholesterol levels should be treated with medicines if diet and exercise are not working.  If you smoke, find out from your health care provider how to quit. If you do not use tobacco, do not start.  Lung cancer screening is recommended for adults aged 34-80 years who are at high risk for developing lung cancer because of a history of smoking. A yearly low-dose CT scan of the lungs is recommended for people who have at least a 30-pack-year history of smoking and are a current smoker or have quit within the past 15 years. A pack year of smoking is smoking an average of 1 pack of cigarettes a day for 1 year (for example: 1 pack a day for 30 years or 2 packs a day for 15 years). Yearly screening should continue until the smoker has stopped  smoking for at least 15 years. Yearly screening should be stopped for people who develop a health problem that would prevent them from having lung cancer treatment.  Avoid use of street drugs. Do not share needles with anyone. Ask for help if you need support or instructions about stopping the use of drugs.  High blood pressure causes heart disease and increases the risk of stroke.  Ongoing high blood pressure should be treated with medicines if weight loss and exercise do not work.  If you are 49-86 years old, ask your health care provider if you should take aspirin to prevent strokes.  Diabetes screening involves taking a blood sample to check your fasting blood sugar level. This should be done once every 3 years, after age 76, if you are within normal weight and without risk factors for diabetes. Testing should be considered at a younger age or be carried out more frequently if you are overweight and have at least 1 risk factor for diabetes.  Breast cancer screening is essential preventive care for women. You should practice "  breast self-awareness." This means understanding the normal appearance and feel of your breasts and may include breast self-examination. Any changes detected, no matter how small, should be reported to a health care provider. Women in their 50s and 30s should have a clinical breast exam (CBE) by a health care provider as part of a regular health exam every 1 to 3 years. After age 82, women should have a CBE every year. Starting at age 66, women should consider having a mammogram (breast X-ray test) every year. Women who have a family history of breast cancer should talk to their health care provider about genetic screening. Women at a high risk of breast cancer should talk to their health care providers about having an MRI and a mammogram every year.  Breast cancer gene (BRCA)-related cancer risk assessment is recommended for women who have family members with BRCA-related  cancers. BRCA-related cancers include breast, ovarian, tubal, and peritoneal cancers. Having family members with these cancers may be associated with an increased risk for harmful changes (mutations) in the breast cancer genes BRCA1 and BRCA2. Results of the assessment will determine the need for genetic counseling and BRCA1 and BRCA2 testing.  Routine pelvic exams to screen for cancer are no longer recommended for nonpregnant women who are considered low risk for cancer of the pelvic organs (ovaries, uterus, and vagina) and who do not have symptoms. Ask your health care provider if a screening pelvic exam is right for you.  If you have had past treatment for cervical cancer or a condition that could lead to cancer, you need Pap tests and screening for cancer for at least 20 years after your treatment. If Pap tests have been discontinued, your risk factors (such as having a new sexual partner) need to be reassessed to determine if screening should be resumed. Some women have medical problems that increase the chance of getting cervical cancer. In these cases, your health care provider may recommend more frequent screening and Pap tests.    Colorectal cancer can be detected and often prevented. Most routine colorectal cancer screening begins at the age of 56 years and continues through age 28 years. However, your health care provider may recommend screening at an earlier age if you have risk factors for colon cancer. On a yearly basis, your health care provider may provide home test kits to check for hidden blood in the stool. Use of a small camera at the end of a tube, to directly examine the colon (sigmoidoscopy or colonoscopy), can detect the earliest forms of colorectal cancer. Talk to your health care provider about this at age 51, when routine screening begins. Direct exam of the colon should be repeated every 5-10 years through age 6 years, unless early forms of pre-cancerous polyps or small growths  are found.  Osteoporosis is a disease in which the bones lose minerals and strength with aging. This can result in serious bone fractures or breaks. The risk of osteoporosis can be identified using a bone density scan. Women ages 13 years and over and women at risk for fractures or osteoporosis should discuss screening with their health care providers. Ask your health care provider whether you should take a calcium supplement or vitamin D to reduce the rate of osteoporosis.  Menopause can be associated with physical symptoms and risks. Hormone replacement therapy is available to decrease symptoms and risks. You should talk to your health care provider about whether hormone replacement therapy is right for you.  Use sunscreen. Apply sunscreen  liberally and repeatedly throughout the day. You should seek shade when your shadow is shorter than you. Protect yourself by wearing long sleeves, pants, a wide-brimmed hat, and sunglasses year round, whenever you are outdoors.  Once a month, do a whole body skin exam, using a mirror to look at the skin on your back. Tell your health care provider of new moles, moles that have irregular borders, moles that are larger than a pencil eraser, or moles that have changed in shape or color.  Stay current with required vaccines (immunizations).  Influenza vaccine. All adults should be immunized every year.  Tetanus, diphtheria, and acellular pertussis (Td, Tdap) vaccine. Pregnant women should receive 1 dose of Tdap vaccine during each pregnancy. The dose should be obtained regardless of the length of time since the last dose. Immunization is preferred during the 27th-36th week of gestation. An adult who has not previously received Tdap or who does not know her vaccine status should receive 1 dose of Tdap. This initial dose should be followed by tetanus and diphtheria toxoids (Td) booster doses every 10 years. Adults with an unknown or incomplete history of completing a  3-dose immunization series with Td-containing vaccines should begin or complete a primary immunization series including a Tdap dose. Adults should receive a Td booster every 10 years.    Zoster vaccine. One dose is recommended for adults aged 36 years or older unless certain conditions are present.    Pneumococcal 13-valent conjugate (PCV13) vaccine. When indicated, a person who is uncertain of her immunization history and has no record of immunization should receive the PCV13 vaccine. An adult aged 41 years or older who has certain medical conditions and has not been previously immunized should receive 1 dose of PCV13 vaccine. This PCV13 should be followed with a dose of pneumococcal polysaccharide (PPSV23) vaccine. The PPSV23 vaccine dose should be obtained at least 8 weeks after the dose of PCV13 vaccine. An adult aged 43 years or older who has certain medical conditions and previously received 1 or more doses of PPSV23 vaccine should receive 1 dose of PCV13. The PCV13 vaccine dose should be obtained 1 or more years after the last PPSV23 vaccine dose.    Pneumococcal polysaccharide (PPSV23) vaccine. When PCV13 is also indicated, PCV13 should be obtained first. All adults aged 42 years and older should be immunized. An adult younger than age 58 years who has certain medical conditions should be immunized. Any person who resides in a nursing home or long-term care facility should be immunized. An adult smoker should be immunized. People with an immunocompromised condition and certain other conditions should receive both PCV13 and PPSV23 vaccines. People with human immunodeficiency virus (HIV) infection should be immunized as soon as possible after diagnosis. Immunization during chemotherapy or radiation therapy should be avoided. Routine use of PPSV23 vaccine is not recommended for American Indians, [place] Natives, or people younger than 65 years unless there are medical conditions that require PPSV23  vaccine. When indicated, people who have unknown immunization and have no record of immunization should receive PPSV23 vaccine. One-time revaccination 5 years after the first dose of PPSV23 is recommended for people aged 19-64 years who have chronic kidney failure, nephrotic syndrome, asplenia, or immunocompromised conditions. People who received 1-2 doses of PPSV23 before age 62 years should receive another dose of PPSV23 vaccine at age 78 years or later if at least 5 years have passed since the previous dose. Doses of PPSV23 are not needed for people immunized with PPSV23  at or after age 79 years.   Preventive Services / Frequency  Ages 68 years and over  Blood pressure check.  Lipid and cholesterol check.  Lung cancer screening. / Every year if you are aged 79-80 years and have a 30-pack-year history of smoking and currently smoke or have quit within the past 15 years. Yearly screening is stopped once you have quit smoking for at least 15 years or develop a health problem that would prevent you from having lung cancer treatment.  Clinical breast exam.** / Every year after age 52 years.  BRCA-related cancer risk assessment.** / For women who have family members with a BRCA-related cancer (breast, ovarian, tubal, or peritoneal cancers).  Mammogram.** / Every year beginning at age 43 years and continuing for as long as you are in good health. Consult with your health care provider.  Pap test.** / Every 3 years starting at age 66 years through age 12 or 71 years with 3 consecutive normal Pap tests. Testing can be stopped between 65 and 70 years with 3 consecutive normal Pap tests and no abnormal Pap or HPV tests in the past 10 years.  Fecal occult blood test (FOBT) of stool. / Every year beginning at age 11 years and continuing until age 63 years. You may not need to do this test if you get a colonoscopy every 10 years.  Flexible sigmoidoscopy or colonoscopy.** / Every 5 years for a flexible  sigmoidoscopy or every 10 years for a colonoscopy beginning at age 1 years and continuing until age 55 years.  Hepatitis C blood test.** / For all people born from [birth year] through 1963 and any individual with known risks for hepatitis C.  Osteoporosis screening.** / A one-time screening for women ages 22 years and over and women at risk for fractures or osteoporosis.  Skin self-exam. / Monthly.  Influenza vaccine. / Every year.  Tetanus, diphtheria, and acellular pertussis (Tdap/Td) vaccine.** / 1 dose of Td every 10 years.  Zoster vaccine.** / 1 dose for adults aged 70 years or older.  Pneumococcal 13-valent conjugate (PCV13) vaccine.** / Consult your health care provider.  Pneumococcal polysaccharide (PPSV23) vaccine.** / 1 dose for all adults aged 2 years and older. Screening for abdominal aortic aneurysm (AAA)  by ultrasound is recommended for people who have history of high blood pressure or who are current or former smokers. ++++++++++++++++++++ Recommend Adult Low Dose Aspirin or  coated  Aspirin 81 mg daily  To reduce risk of Colon Cancer 20 %,  Skin Cancer 26 % ,  Melanoma 46%  and  Pancreatic cancer 60% ++++++++++++++++++++ Vitamin D goal  is between 70-100.  Please make sure that you are taking your Vitamin D as directed.  It is very important as a natural anti-inflammatory  helping hair, skin, and nails, as well as reducing stroke and heart attack risk.  It helps your bones and helps with mood. It also decreases numerous cancer risks so please take it as directed.  Low Vit D is associated with a 200-300% higher risk for CANCER  and 200-300% higher risk for HEART   ATTACK  &  STROKE.   .....................................Marland Kitchen It is also associated with higher death rate at younger ages,  autoimmune diseases like Rheumatoid arthritis, Lupus, Multiple Sclerosis.    Also many other serious conditions, like depression, Alzheimer's Dementia, infertility, muscle aches,  fatigue, fibromyalgia - just to name a few. ++++++++++++++++++ Recommend the book "The END of DIETING" by Dr Excell Seltzer  & the  book "The END of DIABETES " by Dr Excell Seltzer At [email or web address] - get book & Audio CD's    Being diabetic has a  300% increased risk for heart attack, stroke, cancer, and alzheimer- type vascular dementia. It is very important that you work harder with diet by avoiding all foods that are white. Avoid white rice (brown & wild rice is OK), white potatoes (sweetpotatoes in moderation is OK), White bread or wheat bread or anything made out of white flour like bagels, donuts, rolls, buns, biscuits, cakes, pastries, cookies, pizza crust, and pasta (made from white flour & egg whites) - vegetarian pasta or spinach or wheat pasta is OK. Multigrain breads like Arnold's or [employer], or multigrain sandwich thins or flatbreads.  Diet, exercise and weight loss can reverse and cure diabetes in the early stages.  Diet, exercise and weight loss is very important in the control and prevention of complications of diabetes which affects every system in your body, ie. Brain - dementia/stroke, eyes - glaucoma/blindness, heart - heart attack/heart failure, kidneys - dialysis, stomach - gastric paralysis, intestines - malabsorption, nerves - severe painful neuritis, circulation - gangrene & loss of a leg(s), and finally cancer and Alzheimers.    I recommend avoid fried & greasy foods,  sweets/candy, white rice (brown or wild rice or Quinoa is OK), white potatoes (sweet potatoes are OK) - anything made from white flour - bagels, doughnuts, rolls, buns, biscuits,white and wheat breads, pizza crust and traditional pasta made of white flour & egg white(vegetarian pasta or spinach or wheat pasta is OK).  Multi-grain bread is OK - like multi-grain flat bread or sandwich thins. Avoid alcohol in excess. Exercise is also important.    Eat all the vegetables you want - avoid meat, especially red meat and  dairy - especially cheese.  Cheese is the most concentrated form of trans-fats which is the worst thing to clog up our arteries. Veggie cheese is OK which can be found in the fresh produce section at [employer] or [employer] or [employer]  +++++++++++++++++++ DASH Eating Plan  DASH stands for "Dietary Approaches to Stop Hypertension."   The DASH eating plan is a healthy eating plan that has been shown to reduce high blood pressure (hypertension). Additional health benefits may include reducing the risk of type 2 diabetes mellitus, heart disease, and stroke. The DASH eating plan may also help with weight loss. WHAT DO I NEED TO KNOW ABOUT THE DASH EATING PLAN? For the DASH eating plan, you will follow these general guidelines:  Choose foods with a percent daily value for sodium of less than 5% (as listed on the food label).  Use salt-free seasonings or herbs instead of table salt or sea salt.  Check with your health care provider or pharmacist before using salt substitutes.  Eat lower-sodium products, often labeled as "lower sodium" or "no salt added."  Eat fresh foods.  Eat more vegetables, fruits, and low-fat dairy products.  Choose whole grains. Look for the word "whole" as the first word in the ingredient list.  Choose fish   Limit sweets, desserts, sugars, and sugary drinks.  Choose heart-healthy fats.  Eat veggie cheese   Eat more home-cooked food and less restaurant, buffet, and fast food.  Limit fried foods.  Cook foods using methods other than frying.  Limit canned vegetables. If you do use them, rinse them well to decrease the sodium.  When eating at a restaurant, ask that your food be prepared with less salt,  or no salt if possible.                      WHAT FOODS CAN I EAT? Read Dr Fara Olden Fuhrman's books on The End of Dieting & The End of Diabetes  Grains Whole grain or whole wheat bread. Brown rice. Whole grain or whole wheat pasta. Quinoa, bulgur, and  whole grain cereals. Low-sodium cereals. Corn or whole wheat flour tortillas. Whole grain cornbread. Whole grain crackers. Low-sodium crackers.  Vegetables Fresh or frozen vegetables (raw, steamed, roasted, or grilled). Low-sodium or reduced-sodium tomato and vegetable juices. Low-sodium or reduced-sodium tomato sauce and paste. Low-sodium or reduced-sodium canned vegetables.   Fruits All fresh, canned (in natural juice), or frozen fruits.  Protein Products  All fish and seafood.  Dried beans, peas, or lentils. Unsalted nuts and seeds. Unsalted canned beans.  Dairy Low-fat dairy products, such as skim or 1% milk, 2% or reduced-fat cheeses, low-fat ricotta or cottage cheese, or plain low-fat yogurt. Low-sodium or reduced-sodium cheeses.  Fats and Oils Tub margarines without trans fats. Light or reduced-fat mayonnaise and salad dressings (reduced sodium). Avocado. Safflower, olive, or canola oils. Natural peanut or almond butter.  Other Unsalted popcorn and pretzels. The items listed above may not be a complete list of recommended foods or beverages. Contact your dietitian for more options.  +++++++++++++++  WHAT FOODS ARE NOT RECOMMENDED? Grains/ White flour or wheat flour White bread. White pasta. White rice. Refined cornbread. Bagels and croissants. Crackers that contain trans fat.  Vegetables  Creamed or fried vegetables. Vegetables in a . Regular canned vegetables. Regular canned tomato sauce and paste. Regular tomato and vegetable juices.  Fruits Dried fruits. Canned fruit in light or heavy syrup. Fruit juice.  Meat and Other Protein Products Meat in general - RED meat & White meat.  Fatty cuts of meat. Ribs, chicken wings, all processed meats as bacon, sausage, bologna, salami, fatback, hot dogs, bratwurst and packaged luncheon meats.  Dairy Whole or 2% milk, cream, half-and-half, and cream cheese. Whole-fat or sweetened yogurt. Full-fat cheeses or blue cheese. Non-dairy  creamers and whipped toppings. Processed cheese, cheese spreads, or cheese curds.  Condiments Onion and garlic salt, seasoned salt, table salt, and sea salt. Canned and packaged gravies. Worcestershire sauce. Tartar sauce. Barbecue sauce. Teriyaki sauce. Soy sauce, including reduced sodium. Steak sauce. Fish sauce. Oyster sauce. Cocktail sauce. Horseradish. Ketchup and mustard. Meat flavorings and tenderizers. Bouillon cubes. Hot sauce. Tabasco sauce. Marinades. Taco seasonings. Relishes.  Fats and Oils Butter, stick margarine, lard, shortening and bacon fat. Coconut, palm kernel, or palm oils. Regular salad dressings.  Pickles and olives. Salted popcorn and pretzels.  The items listed above may not be a complete list of foods and beverages to avoid.

## 2017-04-09 NOTE — Progress Notes (Addendum)
[facility] Unk Pinto, M.D.     [name], P.A.-C [name], [place] [address] [name], [place] [address] Telephone [phone] Telefax [phone] Annual Screening/Preventative Visit & Comprehensive Evaluation &  Examination     This very nice 77 y.o. DWF presents for a Screening/Preventative Visit & comprehensive evaluation and management of multiple medical co-morbidities.  Patient has been followed for HTN, T2_NIDDM  Prediabetes, Hyperlipidemia and Vitamin D Deficiency. Patient has hx/o Brain aneurysm surgery in 2001 and reports chronic Vertigo since.       HTN predates since 2009. Patient's BP has been controlled at home and patient denies any cardiac symptoms as chest pain, palpitations, shortness of breath, dizziness or ankle swelling. In 2009, she had a false (+) Myoview confirmed by a Negative Heart Cath. Today's BP is at goal - 106/64. Today's EKG  does show Afib w/ controlled VR. She is CHA2DS2VASC 3 with a 3.2% increased risk for stroke.  Patient was advised of arrhythmia and increase CVA risk and she adamantly declined Warfarin /NOAC's, but she did concede to take 2 LD bASA daily.    Patient's hyperlipidemia is controlled with diet. Last lipids were at goal: Lab Results  Component Value Date   CHOL 151 04/09/2017   HDL 47 (L) 04/09/2017   LDLCALC 78 10/06/2016   TRIG 140 04/09/2017   CHOLHDL 3.2 04/09/2017      Patient has prediabetes (A1c 5.9%/2013) and patient denies reactive hypoglycemic symptoms, visual blurring, diabetic polys, or paresthesias. Last A1c was Normal & at goal: Lab Results  Component Value Date   HGBA1C 5.5 04/09/2017      Finally, patient has history of Vitamin D Deficiency ("11"/2008) and last Vitamin D was still very low: Lab Results  Component Value Date   VD25OH 37 04/09/2017   Current Outpatient Medications on File Prior to Visit  Medication Sig   . Cholecalciferol (VITAMIN D PO) Take 4,000 mg by mouth daily.  . diazepam (VALIUM) 5 MG tablet Take 1/2 to 1 tab tid prn for anxiety.  . Multiple Vitamins-Minerals (CENTRUM SILVER 50+WOMEN PO) Take by mouth daily.   No current facility-administered medications on file prior to visit.    No Known Allergies Past Medical History:  Diagnosis Date  . Arthritis   . Asthma   . Hyperlipidemia   . Hypertension   . Migraines   . Prediabetes   . Vertigo   . Vitamin D deficiency    Health Maintenance  Topic Date Due  . INFLUENZA VACCINE  10/14/2016  . PNA vac Low Risk Adult (2 of 2 - PCV13) 03/07/2018 (Originally 10/05/2012)  . MAMMOGRAM  04/04/2018 (Originally 09/16/2014)  . COLONOSCOPY  01/26/2022  . TETANUS/TDAP  01/25/2024  . DEXA SCAN  Completed   Immunization History  Administered Date(s) Administered  . DT 01/24/2014  . Pneumococcal Polysaccharide-23 10/06/2011  . Td 04/23/2003   Last Colon - 01/27/2012 - Dr Delfin Edis recc 10 yr f/u - due 2013 Last MGM - 10/12/2012 - Declines to schedule F/U MGM despite explanation for annual F/U.  Past Surgical History:  Procedure Laterality Date  . CATARACT EXTRACTION W/ INTRAOCULAR LENS  IMPLANT, BILATERAL  2009  . CEREBRAL ANEURYSM REPAIR    . FOOT SURGERY  1993  . TUBAL LIGATION     Family History  Problem Relation Age of Onset  . Hypertension Mother   . Diabetes Mother   . Cancer Mother  Pancreatic  . Cancer Father        Skin   Social History   Tobacco Use  . Smoking status: Former Smoker    Last attempt to quit: 08/07/1990    Years since quitting: 26.6  . Smokeless tobacco: Never Used  Substance Use Topics  . Alcohol use: No    Alcohol/week: 0.0 oz  . Drug use: No    ROS Constitutional: Denies fever, chills, weight loss/gain, headaches, insomnia,  night sweats, and change in appetite. Does c/o fatigue. Eyes: Denies redness, blurred vision, diplopia, discharge, itchy, watery eyes.  ENT: Denies discharge,  congestion, post nasal drip, epistaxis, sore throat, earache, hearing loss, dental pain, Tinnitus, Vertigo, Sinus pain, snoring.  Cardio: Denies chest pain, palpitations, irregular heartbeat, syncope, dyspnea, diaphoresis, orthopnea, PND, claudication, edema Respiratory: denies cough, dyspnea, DOE, pleurisy, hoarseness, laryngitis, wheezing.  Gastrointestinal: Denies dysphagia, heartburn, reflux, water brash, pain, cramps, nausea, vomiting, bloating, diarrhea, constipation, hematemesis, melena, hematochezia, jaundice, hemorrhoids Genitourinary: Denies dysuria, frequency, urgency, nocturia, hesitancy, discharge, hematuria, flank pain Breast: Breast lumps, nipple discharge, bleeding.  Musculoskeletal: Denies arthralgia, myalgia, stiffness, Jt. Swelling, pain, limp, and strain/sprain. Denies falls. Skin: Denies puritis, rash, hives, warts, acne, eczema, changing in skin lesion Neuro: No weakness, tremor, incoordination, spasms, paresthesia, pain Psychiatric: Denies confusion, memory loss, sensory loss. Denies Depression. Endocrine: Denies change in weight, skin, hair change, nocturia, and paresthesia, diabetic polys, visual blurring, hyper / hypo glycemic episodes.  Heme/Lymph: No excessive bleeding, bruising, enlarged lymph nodes.  Physical Exam  BP 106/64   Pulse 84   Temp (!) 97.5 F (36.4 C)   Resp 18   Ht 5' 3.5" (1.613 m)   Wt 173 lb 12.8 oz (78.8 kg)   BMI 30.30 kg/m   General Appearance: Well nourished, well groomed and in no apparent distress.  Eyes: PERRLA, EOMs, conjunctiva no swelling or erythema, normal fundi and vessels. Sinuses: No frontal/maxillary tenderness ENT/Mouth: EACs patent / TMs  nl. Nares clear without erythema, swelling, mucoid exudates. Oral hygiene is good. No erythema, swelling, or exudate. Tongue normal, non-obstructing. Tonsils not swollen or erythematous. Hearing normal.  Neck: Supple, thyroid normal. No bruits, nodes or JVD. Respiratory: Respiratory  effort normal.  BS equal and clear bilateral without rales, rhonci, wheezing or stridor. Cardio: Heart sounds are normal with regular rate and rhythm and no murmurs, rubs or gallops. Peripheral pulses are normal and equal bilaterally without edema. No aortic or femoral bruits. Chest: symmetric with normal excursions and percussion. Breasts: Symmetric, without lumps, nipple discharge, retractions, or fibrocystic changes.  Abdomen: Flat, soft with bowel sounds active. Nontender, no guarding, rebound, hernias, masses, or organomegaly.  Lymphatics: Non tender without lymphadenopathy.  Genitourinary:  Musculoskeletal: Full ROM all peripheral extremities, joint stability, 5/5 strength, and normal gait. Skin: Warm and dry without rashes, lesions, cyanosis, clubbing or  ecchymosis.  Neuro: Cranial nerves intact, reflexes equal bilaterally. Normal muscle tone, no cerebellar symptoms. Sensation intact.  Pysch: Alert and oriented X 3, normal affect, Insight and Judgment appropriate.   Assessment and Plan  1. Annual Preventative Screening Examination  2. Essential hypertension  - EKG 12-Lead - Urinalysis, Routine w reflex microscopic - Microalbumin / creatinine urine ratio - CBC with Differential/Platelet - BASIC METABOLIC PANEL WITH GFR - Magnesium - TSH  3. Hyperlipidemia, mixed  - EKG 12-Lead - Hepatic function panel - Lipid panel - TSH  4. Prediabetes  - EKG 12-Lead - Hemoglobin A1c - Insulin, random  5. Vitamin D deficiency  - VITAMIN D 25 Hydroxy  6. Abnormal glucose  - Hemoglobin A1c - Insulin, random  7. 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 Screening for colorectal cancer  - POC Hemoccult Bld/Stl   9. Screening for ischemic heart disease  - EKG 12-Lead  10. Medication management  - Urinalysis, Routine w reflex microscopic - Microalbumin / creatinine urine ratio - CBC with Differential/Platelet - BASIC METABOLIC PANEL WITH GFR - Hepatic function panel -  Magnesium - Lipid panel - TSH - Hemoglobin A1c - Insulin, random - VITAMIN D 25 Hydroxy        Patient was counseled in prudent diet to achieve/maintain BMI less than 25 for weight control, BP monitoring, regular exercise and medications. Discussed med's effects and SE's. Screening labs and tests as requested with regular follow-up as recommended. Over 40 minutes of exam, counseling, chart review and high complex critical decision making was performed.

## 2017-04-10 LAB — URINALYSIS, ROUTINE W REFLEX MICROSCOPIC
BILIRUBIN URINE: NEGATIVE
Hgb urine dipstick: NEGATIVE
KETONES UR: NEGATIVE
NITRITE: NEGATIVE
SPECIFIC GRAVITY, URINE: 1.036 — AB (ref 1.001–1.03)

## 2017-04-10 LAB — MICROALBUMIN / CREATININE URINE RATIO
CREATININE, URINE: 563 mg/dL — AB (ref 20–275)
Microalb Creat Ratio: 8 mcg/mg creat (ref <30)
Microalb, Ur: 4.7 mg/dL

## 2017-04-11 ENCOUNTER — Encounter: Payer: Self-pay | Admitting: Internal Medicine

## 2017-04-12 ENCOUNTER — Other Ambulatory Visit: Payer: Self-pay | Admitting: *Deleted

## 2017-04-12 DIAGNOSIS — S069XAA Unspecified intracranial injury with loss of consciousness status unknown, initial encounter: Secondary | ICD-10-CM

## 2017-04-12 DIAGNOSIS — S069X9A Unspecified intracranial injury with loss of consciousness of unspecified duration, initial encounter: Principal | ICD-10-CM

## 2017-04-12 DIAGNOSIS — R42 Dizziness and giddiness: Secondary | ICD-10-CM

## 2017-04-12 LAB — CBC WITH DIFFERENTIAL/PLATELET
BASOS ABS: 53 {cells}/uL (ref 0–200)
Basophils Relative: 0.8 %
EOS ABS: 33 {cells}/uL (ref 15–500)
Eosinophils Relative: 0.5 %
HEMATOCRIT: 37.6 % (ref 35.0–45.0)
Hemoglobin: 12.4 g/dL (ref 11.7–15.5)
Lymphs Abs: 1003 cells/uL (ref 850–3900)
MCH: 28.2 pg (ref 27.0–33.0)
MCHC: 33 g/dL (ref 32.0–36.0)
MCV: 85.6 fL (ref 80.0–100.0)
MPV: 12.9 fL — ABNORMAL HIGH (ref 7.5–12.5)
Monocytes Relative: 8.9 %
NEUTROS PCT: 74.6 %
Neutro Abs: 4924 cells/uL (ref 1500–7800)
Platelets: 229 10*3/uL (ref 140–400)
RBC: 4.39 10*6/uL (ref 3.80–5.10)
RDW: 13 % (ref 11.0–15.0)
Total Lymphocyte: 15.2 %
WBC: 6.6 10*3/uL (ref 3.8–10.8)
WBCMIX: 587 {cells}/uL (ref 200–950)

## 2017-04-12 LAB — HEPATIC FUNCTION PANEL
AG Ratio: 2 (calc) (ref 1.0–2.5)
ALBUMIN MSPROF: 4.1 g/dL (ref 3.6–5.1)
ALT: 9 U/L (ref 6–29)
AST: 14 U/L (ref 10–35)
Alkaline phosphatase (APISO): 102 U/L (ref 33–130)
BILIRUBIN DIRECT: 0.1 mg/dL (ref 0.0–0.2)
BILIRUBIN INDIRECT: 0.2 mg/dL (ref 0.2–1.2)
BILIRUBIN TOTAL: 0.3 mg/dL (ref 0.2–1.2)
Globulin: 2.1 g/dL (calc) (ref 1.9–3.7)
Total Protein: 6.2 g/dL (ref 6.1–8.1)

## 2017-04-12 LAB — BASIC METABOLIC PANEL WITH GFR
BUN / CREAT RATIO: 21 (calc) (ref 6–22)
BUN: 21 mg/dL (ref 7–25)
CALCIUM: 9.4 mg/dL (ref 8.6–10.4)
CO2: 26 mmol/L (ref 20–32)
Chloride: 108 mmol/L (ref 98–110)
Creat: 1.01 mg/dL — ABNORMAL HIGH (ref 0.60–0.93)
GFR, EST AFRICAN AMERICAN: 64 mL/min/{1.73_m2} (ref >=60)
GFR, EST NON AFRICAN AMERICAN: 55 mL/min/{1.73_m2} — AB (ref >=60)
Glucose, Bld: 146 mg/dL — ABNORMAL HIGH (ref 65–99)
Potassium: 3.6 mmol/L (ref 3.5–5.3)
Sodium: 144 mmol/L (ref 135–146)

## 2017-04-12 LAB — LIPID PANEL
CHOLESTEROL: 151 mg/dL (ref <200)
HDL: 47 mg/dL — ABNORMAL LOW (ref >50)
LDL CHOLESTEROL (CALC): 80 mg/dL
Non-HDL Cholesterol (Calc): 104 mg/dL (calc) (ref <130)
Total CHOL/HDL Ratio: 3.2 (calc) (ref <5.0)
Triglycerides: 140 mg/dL (ref <150)

## 2017-04-12 LAB — HEMOGLOBIN A1C
Hgb A1c MFr Bld: 5.5 % of total Hgb (ref <5.7)
MEAN PLASMA GLUCOSE: 111 (calc)
eAG (mmol/L): 6.2 (calc)

## 2017-04-12 LAB — VITAMIN D 25 HYDROXY (VIT D DEFICIENCY, FRACTURES): Vit D, 25-Hydroxy: 37 ng/mL (ref 30–100)

## 2017-04-12 LAB — INSULIN, RANDOM: INSULIN: 45.5 u[IU]/mL — AB (ref 2.0–19.6)

## 2017-04-12 LAB — TSH: TSH: 3.04 m[IU]/L (ref 0.40–4.50)

## 2017-04-12 LAB — MAGNESIUM: MAGNESIUM: 2 mg/dL (ref 1.5–2.5)

## 2017-04-12 MED ORDER — DIAZEPAM 5 MG PO TABS: ORAL_TABLET | ORAL | 1 refills | Status: DC

## 2017-06-16 ENCOUNTER — Encounter: Payer: Self-pay | Admitting: Adult Health

## 2017-06-16 ENCOUNTER — Ambulatory Visit (INDEPENDENT_AMBULATORY_CARE_PROVIDER_SITE_OTHER): Payer: PPO | Admitting: Adult Health

## 2017-06-16 VITALS — BP 134/62 | HR 87 | Temp 97.7°F | Ht 63.5 in | Wt 185.0 lb

## 2017-06-16 DIAGNOSIS — I482 Chronic atrial fibrillation, unspecified: Secondary | ICD-10-CM

## 2017-06-16 DIAGNOSIS — G8929 Other chronic pain: Secondary | ICD-10-CM | POA: Diagnosis not present

## 2017-06-16 DIAGNOSIS — I4891 Unspecified atrial fibrillation: Secondary | ICD-10-CM

## 2017-06-16 DIAGNOSIS — M79605 Pain in left leg: Secondary | ICD-10-CM

## 2017-06-16 DIAGNOSIS — M1990 Unspecified osteoarthritis, unspecified site: Secondary | ICD-10-CM | POA: Diagnosis not present

## 2017-06-16 DIAGNOSIS — M79604 Pain in right leg: Secondary | ICD-10-CM

## 2017-06-16 HISTORY — DX: Unspecified atrial fibrillation: I48.91

## 2017-06-16 NOTE — Progress Notes (Signed)
Assessment and Plan:  Lori [name] was seen today for leg pain.  Diagnoses and all orders for this visit:  Arthritis/ Chronic pain of lower extremity, bilateral Despite no distinct joint pain, she demonstrates crepitus, bony changes, significantly decreased ROM, chronic progressive pain worse with ambulation significantly limiting ADLs. Poorly responding to conservative therapy by NSAIDS. After discussion with patient will refer to ortho and defer imaging to them. She is unlikely to be agreeable to surgical intervention though we discussed possible need if advanced large joint arthritis on imaging, but will have them discuss other management options.  -     Ambulatory referral to Orthopedics  Chronic atrial fibrillation ([place]) Has declined anticoagulation despite discussion of risks citing cost Recommended she start on ASA today, will follow up further and continue to discuss at next visit  Go to the ER if any chest pain, shortness of breath, nausea, dizziness, severe HA, changes vision/speech  Further disposition pending results of labs. Discussed med's effects and SE's.   Over 15 minutes of exam, counseling, chart review, and critical decision making was performed.   Future Appointments  Date Time Provider [place]  07/20/2017 11:30 AM [name], NP [facility] None  10/29/2017 11:30 AM Unk Pinto, MD [facility] None  05/06/2018 10:00 AM Unk Pinto, MD [facility] None    ------------------------------------------------------------------------------------------------------------------   HPI BP 134/62   Pulse 87   Temp 97.7 F (36.5 C)   Ht 5' 3.5" (1.613 m)   Wt 185 lb (83.9 kg)   SpO2 98%   BMI 32.26 kg/m   77 y.o.female with hx of arthritis, who is very resistant to medications and treatments presents for ongoing bilateral lower extremity pain which she reports has been ongoing for several years and now is "just too bad to stand." She endorses ongoing strong  generalized achy pain throughout her bilateral lower extremities, notably bilateral knees, worse on left with swelling, and is now having some instability when she walks - reports "it feels like my legs will give out under me." No pain with rest, just with standing and walking - reports 10/10 with ambulation "makes me sick to the stomach" - recently started taking aleve 400 mg BID for the past 4 weeks (typically very resistant to any medications) with mild improvement of pain, but continues to walk with a limp and reports uses a walker while outside though she does not present with this today. She has not had imaging. She denies myalgias, fatigue/malaise, rash, night sweats, back pain, headaches.   Past Medical History:  Diagnosis Date  . Arthritis   . Asthma   . Hyperlipidemia   . Hypertension   . Migraines   . Prediabetes   . Vertigo   . Vitamin D deficiency      No Known Allergies  Current Outpatient Medications on File Prior to Visit  Medication Sig  . Cholecalciferol (VITAMIN D PO) Take 4,000 mg by mouth daily.  . diazepam (VALIUM) 5 MG tablet Take 1/2 to 1 tab tid prn for anxiety.  . Multiple Vitamins-Minerals (CENTRUM SILVER 50+WOMEN PO) Take by mouth daily.   No current facility-administered medications on file prior to visit.     ROS: Review of Systems  Constitutional: Negative for chills, diaphoresis, fever, malaise/fatigue and weight loss.  HENT: Negative.   Eyes: Negative.   Cardiovascular: Negative for chest pain, palpitations, claudication and leg swelling.  Gastrointestinal: Positive for nausea (With pain of ambulation).  Musculoskeletal: Positive for joint pain. Negative for back pain, falls, myalgias and  neck pain.  Skin: Negative for rash.  Neurological: Negative for dizziness, tingling, sensory change, focal weakness and headaches.   Physical Exam:  BP 134/62   Pulse 87   Temp 97.7 F (36.5 C)   Ht 5' 3.5" (1.613 m)   Wt 185 lb (83.9 kg)   SpO2 98%   BMI  32.26 kg/m   General Appearance: Well nourished, with severe body odor/poor hygiene, in no apparent distress. Neck: Supple.  Respiratory: Respiratory effort normal, BS equal bilaterally without rales, rhonchi, wheezing or stridor.  Cardio: Irregularly irregular. Brisk peripheral pulses without notable LE edema.  Abdomen: Soft, + BS.  Non tender, no guarding, rebound, hernias, masses. Lymphatics: Non tender without lymphadenopathy.  Musculoskeletal: Symmetrical strength, slow antalgic gait, obvious joint enlargement throughout PIP and DIP joints of bilateral hands, crepitus with bilateral knee ROM, R hip with notable lack of ROM with abduction, left knee with limited flexion past 90 degrees, with mild effusion without heat or erythema.   Skin: Warm, dry without rashes, lesions, ecchymosis.  Neuro: Normal muscle tone, no cerebellar symptoms. Sensation intact.  Psych: Awake and oriented X 3, normal affect, Insight and Judgment appropriate.     Izora Ribas, NP 3:11 PM [facility]

## 2017-06-16 NOTE — Patient Instructions (Signed)
Try tumeric + bioprene (black pepper fruit) supplement daily for natural antiinflammatory supplement  Joint Pain Joint pain, which is also called arthralgia, can be caused by many things. Joint pain often goes away when you follow your health care provider's instructions for relieving pain at home. However, joint pain can also be caused by conditions that require further treatment. Common causes of joint pain include:  Bruising in the area of the joint.  Overuse of the joint.  Wear and tear on the joints that occur with aging (osteoarthritis).  Various other forms of arthritis.  A buildup of a crystal form of uric acid in the joint (gout).  Infections of the joint (septic arthritis) or of the bone (osteomyelitis).  Your health care provider may recommend medicine to help with the pain. If your joint pain continues, additional tests may be needed to diagnose your condition. Follow these instructions at home: Watch your condition for any changes. Follow these instructions as directed to lessen the pain that you are feeling.  Take medicines only as directed by your health care provider.  Rest the affected area for as long as your health care provider says that you should. If directed to do so, raise the painful joint above the level of your heart while you are sitting or lying down.  Do not do things that cause or worsen pain.  If directed, apply ice to the painful area: ? Put ice in a plastic bag. ? Place a towel between your skin and the bag. ? Leave the ice on for 20 minutes, 2-3 times per day.  Wear an elastic bandage, splint, or sling as directed by your health care provider. Loosen the elastic bandage or splint if your fingers or toes become numb and tingle, or if they turn cold and blue.  Begin exercising or stretching the affected area as directed by your health care provider. Ask your health care provider what types of exercise are safe for you.  Keep all follow-up visits as  directed by your health care provider. This is important.  Contact a health care provider if:  Your pain increases, and medicine does not help.  Your joint pain does not improve within 3 days.  You have increased bruising or swelling.  You have a fever.  You lose 10 lb (4.5 kg) or more without trying. Get help right away if:  You are not able to move the joint.  Your fingers or toes become numb or they turn cold and blue. This information is not intended to replace advice given to you by your health care provider. Make sure you discuss any questions you have with your health care provider. Document Released: 03/02/2005 Document Revised: 08/02/2015 Document Reviewed: 12/12/2013 Elsevier Interactive Patient Education  [employer].

## 2017-06-21 ENCOUNTER — Ambulatory Visit (INDEPENDENT_AMBULATORY_CARE_PROVIDER_SITE_OTHER): Payer: PPO | Admitting: Orthopedic Surgery

## 2017-06-21 ENCOUNTER — Encounter (INDEPENDENT_AMBULATORY_CARE_PROVIDER_SITE_OTHER): Payer: Self-pay | Admitting: Orthopedic Surgery

## 2017-06-21 ENCOUNTER — Ambulatory Visit (INDEPENDENT_AMBULATORY_CARE_PROVIDER_SITE_OTHER): Payer: PPO

## 2017-06-21 VITALS — Ht 63.0 in | Wt 185.0 lb

## 2017-06-21 DIAGNOSIS — M79605 Pain in left leg: Secondary | ICD-10-CM

## 2017-06-21 DIAGNOSIS — M79604 Pain in right leg: Secondary | ICD-10-CM

## 2017-06-21 MED ORDER — PREDNISONE 10 MG PO TABS: 20.0000 mg | ORAL_TABLET | Freq: Every day | ORAL | 0 refills | Status: DC

## 2017-06-21 NOTE — Progress Notes (Signed)
Office Visit Note   Patient: Lori [name]           Date of Birth: April 26, 1940           MRN: [id number] Visit Date: 06/21/2017              Requested by: [name], NP [address] [place], [place] [address] PCP: Unk Pinto, MD  Chief Complaint  Patient presents with  . Right Leg - Pain  . Left Leg - Pain      HPI: Patient is a 77 year old woman who is seen for initial evaluation for radicular pain down both lower extremities she states occasionally has hip pain bilaterally knee pain bilaterally.  She states occasionally she has to manually lift her leg to get her leg in the car.  She states she has weakness in both lower extremities as well as restless leg.  She uses a walker at times.  She states occasionally pain radiates from the buttocks down the legs and that the pain can come and go at all time she also complains of arthritic pain of both hands.  She is currently taking 2 Aleve twice a day.  Assessment & Plan: Visit Diagnoses:  1. Bilateral leg pain     Plan: We will try her on a low-dose prednisone 20 mg with breakfast and then wean off and has improvement in her symptoms.  If she has no relief with the prednisone she will call and we will set her up for an MRI scan of the lumbar spine.  Also discussed that she does have an aortic aneurysm and that follow-up with ultrasound would be good to have this checked annually.  Follow-Up Instructions: Return if symptoms worsen or fail to improve.   Ortho Exam  Patient is alert, oriented, no adenopathy, well-dressed, normal affect, normal respiratory effort. Examination patient has an antalgic gait with an abductor lurch consistent with arthritis of her hips she has Heberden's and Bouchard's nodes of her hands with bony spurs of the carpometacarpal joint of both hands.  She has no pain with passive range of motion of the hips or knees but does have tenderness to palpation of the medial lateral joint line  of her knee.  She has a negative straight leg raise and no focal motor weakness in either lower extremity.  Patient states she also has atrial fibrillation and does not want to pay for medication to treat this.  Imaging: Xr Lumbar Spine 2-3 Views  Result Date: 06/21/2017 2 view radiographs of the lumbar spine shows osteoarthritis involving the lumbar spine as well as degenerative arthritis of both hips.  Lateral radiograph shows joint space narrowing as well as a abdominal aneurysm approximately 35 mm.  No images are attached to the encounter.  Labs: Lab Results  Component Value Date   HGBA1C 5.5 04/09/2017   HGBA1C 5.4 10/06/2016   HGBA1C 5.4 02/11/2016    @LABSALLVALUES (HGBA1)@  Body mass index is 32.77 kg/m.  Orders:  Orders Placed This Encounter  Procedures  . XR Lumbar Spine 2-3 Views   Meds ordered this encounter  Medications  . predniSONE (DELTASONE) 10 MG tablet    Sig: Take 2 tablets (20 mg total) by mouth daily with breakfast.    Dispense:  60 tablet    Refill:  0     Procedures: No procedures performed  Clinical Data: No additional findings.  ROS:  All other systems negative, except as noted in the HPI. Review of Systems  Objective: Vital Signs: Ht 5\' 3"  (1.6 m)   Wt 185 lb (83.9 kg)   BMI 32.77 kg/m   Specialty Comments:  No specialty comments available.  PMFS History: Patient Active Problem List   Diagnosis Date Noted  . Atrial fibrillation ([place]) 06/16/2017  . 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 BMI 31.0-31.9,adult 02/06/2015  . Medication management 11/18/2013  . Hyperlipidemia   . Prediabetes   . Vitamin D deficiency   . Asthma   . Hypertension   . Migraines    Past Medical History:  Diagnosis Date  . Arthritis   . Asthma   . Hyperlipidemia   . Hypertension   . Migraines   . Prediabetes   . Vertigo   . Vitamin D deficiency     Family History  Problem Relation Age of Onset  . Hypertension Mother   . Diabetes  Mother   . Cancer Mother        Pancreatic  . Cancer Father        Skin    Past Surgical History:  Procedure Laterality Date  . CATARACT EXTRACTION W/ INTRAOCULAR LENS  IMPLANT, BILATERAL  2009  . CEREBRAL ANEURYSM REPAIR    . FOOT SURGERY  1993  . TUBAL LIGATION     Social History   Occupational History  . Not on file  Tobacco Use  . Smoking status: Former Smoker    Last attempt to quit: 08/07/1990    Years since quitting: 26.8  . Smokeless tobacco: Never Used  Substance and Sexual Activity  . Alcohol use: No    Alcohol/week: 0.0 oz  . Drug use: No  . Sexual activity: Not on file

## 2017-06-30 ENCOUNTER — Ambulatory Visit (HOSPITAL_COMMUNITY)
Admission: RE | Admit: 2017-06-30 | Discharge: 2017-06-30 | Disposition: A | Discharge status: Home / Self Care | Payer: PPO | Source: Ambulatory Visit | Attending: Internal Medicine | Admitting: Internal Medicine

## 2017-06-30 ENCOUNTER — Ambulatory Visit (INDEPENDENT_AMBULATORY_CARE_PROVIDER_SITE_OTHER): Payer: PPO | Admitting: Internal Medicine

## 2017-06-30 VITALS — BP 126/80 | HR 76 | Temp 97.5°F | Resp 16 | Ht 63.0 in | Wt 180.4 lb

## 2017-06-30 DIAGNOSIS — R531 Weakness: Secondary | ICD-10-CM | POA: Diagnosis not present

## 2017-06-30 DIAGNOSIS — M791 Myalgia, unspecified site: Secondary | ICD-10-CM | POA: Diagnosis not present

## 2017-06-30 DIAGNOSIS — Z79899 Other long term (current) drug therapy: Secondary | ICD-10-CM | POA: Diagnosis not present

## 2017-06-30 DIAGNOSIS — M79605 Pain in left leg: Secondary | ICD-10-CM | POA: Diagnosis not present

## 2017-06-30 DIAGNOSIS — R0609 Other forms of dyspnea: Secondary | ICD-10-CM | POA: Diagnosis not present

## 2017-06-30 DIAGNOSIS — I719 Aortic aneurysm of unspecified site, without rupture: Secondary | ICD-10-CM | POA: Insufficient documentation

## 2017-06-30 DIAGNOSIS — M79604 Pain in right leg: Secondary | ICD-10-CM

## 2017-06-30 DIAGNOSIS — R918 Other nonspecific abnormal finding of lung field: Secondary | ICD-10-CM | POA: Diagnosis not present

## 2017-06-30 DIAGNOSIS — R0602 Shortness of breath: Secondary | ICD-10-CM | POA: Diagnosis not present

## 2017-06-30 NOTE — Progress Notes (Signed)
  Subjective:    Patient ID: Lori [name], female    DOB: Feb 17, 1941, 77 y.o.   MRN: [id number]  HPI  Patient presents with c/o non-specific weakness & exertional dyspnea. She denies any CP - exertional or otherwise and also has had no congestion or wheezing. She does c/o vague lower extremity discomfort from ger groin(s)_ to the toes described as an "aching" occurring intermittently and not particularly associated wit activity/walking. Occasionally , she reports it feels as through her legs will "give out". 9-10 days ago she was evaluated by Dr Sharol Given & Rx'd with prednisone. She reports the prednisone has improved her lower extremity discomfort has improved.   Medication Sig  . Cholecalciferol (VITAMIN D PO) Take 2,000-4,000 mg daily.   . diazepam (VALIUM) 5 MG tablet Take 1/2 to 1 tab tid prn for anxiety  . Multiple Vitamins-Minerals  Take daily.  . predniSONE 10 MG tablet Take 2 tablets daily with breakfast.   No Known Allergies   Past Medical History:  Diagnosis Date  . Arthritis   . Asthma   . Hyperlipidemia   . Hypertension   . Migraines   . Prediabetes   . Vertigo   . Vitamin D deficiency    Review of Systems  10 point systems review negative except as above.    Objective:   Physical Exam  BP 126/80   Pulse 76   Temp (!) 97.5 F (36.4 C)   Resp 16   Ht 5\' 3"  (1.6 m)   Wt 180 lb 6.4 oz (81.8 kg)   SpO2 96%   BMI 31.96 kg/m   In no Distress.  HEENT - WNL. Neck - supple.  Chest - Clear equal BS. Cor - Nl HS. RRR w/o sig MGR. PP 1(+). No edema. MS- FROM w/o deformities.  Gait Nl. Neuro -  Nl w/o focal abnormalities.    Assessment & Plan:   1. Pain in both lower extremities  - CBC with Differential/Platelet - COMPLETE METABOLIC PANEL WITH GFR - Magnesium - Sedimentation rate - C-reactive protein - Myasthenia gravis panel 2  2. Weakness  - CBC with Differential/Platelet - COMPLETE METABOLIC PANEL WITH GFR - Magnesium - Sedimentation rate - CK -  Aldolase - B. burgdorfi antibodies - C-reactive protein - Myasthenia gravis panel 2  3. Myalgia  - CBC with Differential/Platelet - Magnesium - Sedimentation rate - CK - Aldolase - B. burgdorfi antibodies - C-reactive protein - Myasthenia gravis panel 2  4. DOE (dyspnea on exertion)  - DG Chest 2 View  5. Medication management  - CBC with Differential/Platelet - COMPLETE METABOLIC PANEL WITH GFR - Magnesium - Sedimentation rate

## 2017-07-01 ENCOUNTER — Encounter (HOSPITAL_COMMUNITY): Payer: Self-pay | Admitting: Emergency Medicine

## 2017-07-01 ENCOUNTER — Observation Stay (HOSPITAL_COMMUNITY)
Admission: EM | Admit: 2017-07-01 | Discharge: 2017-07-01 | Disposition: A | Discharge status: Home / Self Care | Payer: PPO | Attending: Internal Medicine | Admitting: Internal Medicine

## 2017-07-01 ENCOUNTER — Other Ambulatory Visit: Payer: Self-pay

## 2017-07-01 DIAGNOSIS — Z87891 Personal history of nicotine dependence: Secondary | ICD-10-CM | POA: Insufficient documentation

## 2017-07-01 DIAGNOSIS — M7989 Other specified soft tissue disorders: Secondary | ICD-10-CM | POA: Diagnosis not present

## 2017-07-01 DIAGNOSIS — I10 Essential (primary) hypertension: Principal | ICD-10-CM | POA: Insufficient documentation

## 2017-07-01 DIAGNOSIS — E559 Vitamin D deficiency, unspecified: Secondary | ICD-10-CM | POA: Insufficient documentation

## 2017-07-01 DIAGNOSIS — Z79899 Other long term (current) drug therapy: Secondary | ICD-10-CM | POA: Diagnosis not present

## 2017-07-01 DIAGNOSIS — R531 Weakness: Secondary | ICD-10-CM | POA: Diagnosis not present

## 2017-07-01 DIAGNOSIS — D649 Anemia, unspecified: Secondary | ICD-10-CM | POA: Diagnosis present

## 2017-07-01 DIAGNOSIS — Z7982 Long term (current) use of aspirin: Secondary | ICD-10-CM | POA: Insufficient documentation

## 2017-07-01 DIAGNOSIS — R0602 Shortness of breath: Secondary | ICD-10-CM | POA: Diagnosis not present

## 2017-07-01 LAB — CBC
HEMATOCRIT: 28.8 % — AB (ref 36.0–46.0)
HEMOGLOBIN: 8.8 g/dL — AB (ref 12.0–15.0)
MCH: 25.2 pg — AB (ref 26.0–34.0)
MCHC: 30.6 g/dL (ref 30.0–36.0)
MCV: 82.5 fL (ref 78.0–100.0)
Platelets: 273 10*3/uL (ref 150–400)
RBC: 3.49 MIL/uL — ABNORMAL LOW (ref 3.87–5.11)
RDW: 15 % (ref 11.5–15.5)
WBC: 7.4 10*3/uL (ref 4.0–10.5)

## 2017-07-01 LAB — RETICULOCYTES
RBC.: 3.49 MIL/uL — ABNORMAL LOW (ref 3.87–5.11)
RETIC COUNT ABSOLUTE: 69.8 10*3/uL (ref 19.0–186.0)
Retic Ct Pct: 2 % (ref 0.4–3.1)

## 2017-07-01 LAB — ABO/RH: ABO/RH(D): A POS

## 2017-07-01 LAB — TYPE AND SCREEN
ABO/RH(D): A POS
ANTIBODY SCREEN: NEGATIVE

## 2017-07-01 NOTE — Consult Note (Signed)
[facility] H&P   Patient Demographics:    Lori [name], is a 77 y.o. female  MRN: [id number]   DOB - May 16, 1940  Admit Date - 07/01/2017  Outpatient Primary MD for the patient is Unk Pinto, MD  Referring MD/NP/PA: Davonna Belling  Outpatient Specialists:  [facility]  Patient coming from: home  Chief Complaint  Patient presents with  . Abnormal Lab      HPI:    Lori [name]  is a 77 y.o. female, w Asthma, Hypertension, Hyperlipidemia, Glucose intolerance apparently has been recently tx with prednisone for myalgia and felt better.  Pt has been fatigued and her pcp labs showed anemia and she was sent to ED for evaluation.   In ED,  Hgb 8.8.  Pt denies abd pain, fever, chills, n/v, diarrhea, brbpr, black stool.  Pt has responsibilities at home and would like to go home tonight.      Review of systems:    In addition to the HPI above,  No Fever-chills, No Headache, No changes with Vision or hearing, No problems swallowing food or Liquids, No Chest pain, Cough or Shortness of Breath, No Abdominal pain, No Nausea or Vommitting, Bowel movements are regular, No Blood in stool or Urine, No dysuria, No new skin rashes or bruises, No new joints pains-aches,  No new weakness, tingling, numbness in any extremity, No recent weight gain or loss, No polyuria, polydypsia or polyphagia, No significant Mental Stressors.  A full 10 point Review of Systems was done, except as stated above, all other Review of Systems were negative.   With Past History of the following :    Past Medical History:  Diagnosis Date  . Arthritis   . Asthma   . Hyperlipidemia   . Hypertension   . Migraines   . Prediabetes   . Vertigo   . Vitamin D deficiency       Past Surgical History:  Procedure Laterality Date  . CATARACT EXTRACTION W/ INTRAOCULAR LENS  IMPLANT, BILATERAL  2009  .  CEREBRAL ANEURYSM REPAIR    . FOOT SURGERY  1993  . TUBAL LIGATION        Social History:     Social History   Tobacco Use  . Smoking status: Former Smoker    Last attempt to quit: 08/07/1990    Years since quitting: 26.9  . Smokeless tobacco: Never Used  Substance Use Topics  . Alcohol use: No    Alcohol/week: 0.0 oz     Lives - at home  Mobility - walks by self   Family History :     Family History  Problem Relation Age of Onset  . Hypertension Mother   . Diabetes Mother   . Cancer Mother        Pancreatic  . Cancer Father        Skin      Home  Medications:   Prior to Admission medications   Medication Sig Start Date End Date Taking? Authorizing Provider  aspirin EC 81 MG tablet Take 162 mg by mouth daily.   Yes [provider]  Cholecalciferol (VITAMIN D PO) Take 2,000-4,000 mg by mouth daily.    Yes [provider]  diazepam (VALIUM) 5 MG tablet Take 1/2 to 1 tab tid prn for anxiety. Patient taking differently: 2.5-5 mg. Take 1/2 to 1 tab tid prn for anxiety. 04/12/17  Yes Unk Pinto, MD  Multiple Vitamins-Minerals (CENTRUM SILVER 50+WOMEN PO) Take by mouth daily.   Yes [provider]  predniSONE (DELTASONE) 10 MG tablet Take 2 tablets (20 mg total) by mouth daily with breakfast. 06/21/17  Yes Newt Minion, MD     Allergies:    No Known Allergies   Physical Exam:   Vitals  Blood pressure (!) 172/82, pulse 78, temperature 98.2 F (36.8 C), temperature source Oral, resp. rate 13, height 5\' 5"  (1.651 m), weight 79.4 kg (175 lb), SpO2 100 %.   1. General lying in bed in NAD,   2. Normal affect and insight, Not Suicidal or Homicidal, Awake Alert, Oriented X 3.  3. No F.N deficits, ALL C.Nerves Intact, Strength 5/5 all 4 extremities, Sensation intact all 4 extremities, Plantars down going.  4. Ears and Eyes appear Normal, Conjunctivae clear, slightly pale,  PERRLA. Moist Oral Mucosa.  5. Supple Neck, No JVD, No  cervical lymphadenopathy appriciated, No Carotid Bruits.  6. Symmetrical Chest wall movement, Good air movement bilaterally, CTAB.  7. RRR, No Gallops, Rubs or Murmurs, No Parasternal Heave.  8. Positive Bowel Sounds, Abdomen Soft, No tenderness, No organomegaly appriciated,No rebound -guarding or rigidity.  9.  No Cyanosis, Normal Skin Turgor, No Skin Rash or Bruise.  10. Good muscle tone,  joints appear normal , no effusions, Normal ROM.  11. No Palpable Lymph Nodes in Neck or Axillae      Data Review:    CBC Recent Labs  Lab 06/30/17 1047 07/01/17 1905  WBC 4.5 7.4  HGB 8.7* 8.8*  HCT 28.3* 28.8*  PLT 262 273  MCV 80.6 82.5  MCH 24.8* 25.2*  MCHC 30.7* 30.6  RDW 13.8 15.0  LYMPHSABS 968  --   EOSABS 50  --   BASOSABS 50  --    ------------------------------------------------------------------------------------------------------------------  Chemistries  Recent Labs  Lab 06/30/17 1047  NA 143  K 4.5  CL 109  CO2 29  GLUCOSE 102*  BUN 20  CREATININE 0.78  CALCIUM 9.2  MG 2.3  AST 14  ALT 10  BILITOT 0.3   ------------------------------------------------------------------------------------------------------------------ estimated creatinine clearance is 64.3 mL/min (by C-G formula based on SCr of 0.78 mg/dL). ------------------------------------------------------------------------------------------------------------------ No results for input(s): TSH, T4TOTAL, T3FREE, THYROIDAB in the last 72 hours.  Invalid input(s): FREET3  Coagulation profile No results for input(s): INR, PROTIME in the last 168 hours. ------------------------------------------------------------------------------------------------------------------- No results for input(s): DDIMER in the last 72 hours. -------------------------------------------------------------------------------------------------------------------  Cardiac Enzymes No results for input(s): CKMB, TROPONINI,  MYOGLOBIN in the last 168 hours.  Invalid input(s): CK ------------------------------------------------------------------------------------------------------------------ No results found for: BNP   ---------------------------------------------------------------------------------------------------------------  Urinalysis    Component Value Date/Time   COLORURINE DARK YELLOW 04/09/2017 1155   APPEARANCEUR TURBID (A) 04/09/2017 1155   LABSPEC 1.036 (H) 04/09/2017 1155   PHURINE < OR = 5.0 04/09/2017 1155   GLUCOSEU TRACE (A) 04/09/2017 1155   HGBUR NEGATIVE 04/09/2017 [place] 02/11/2016 [place] 04/09/2017  1155   PROTEINUR 1+ (A) 04/09/2017 1155   NITRITE NEGATIVE 04/09/2017 1155   LEUKOCYTESUR 1+ (A) 04/09/2017 1155    ----------------------------------------------------------------------------------------------------------------   Imaging Results:    Dg Chest 2 View  Result Date: 06/30/2017 CLINICAL DATA:  Shortness of breath, leg weakness with walking and light exertion for past year; history asthma, hypertension, former smoker EXAM: CHEST - 2 VIEW COMPARISON:  None FINDINGS: Borderline enlargement of cardiac silhouette. Mediastinal contours and pulmonary vascularity normal. Lungs slightly hyperinflated but clear. No acute infiltrate, pleural effusion or pneumothorax. Eventration of the anterior RIGHT diaphragm noted. No acute osseous findings. Mild degenerative disc disease changes of the midthoracic spine. IMPRESSION: Mild hyperinflation without acute infiltrate. Electronically Signed   By: Lavonia Dana M.D.   On: 06/30/2017 12:32       Assessment & Plan:    Active Problems:   Symptomatic anemia    Symptomatic anemia FOBT pending Iron studies, b12 , folate pending Pt will please start ferrous sulfate 325mg  po qday Warned about side affects including stool turning black and constipation Pt made aware some vitamin C or orange juice  will help in the absorption of iron  Recommend outpatient SPEP, UPEP Recommend f/u with GI  Repeat cbc in 1 week by pcp   AM Labs Ordered, also please review Full Orders  Family Communication: Admission, patients condition and plan of care including tests being ordered have been discussed with the patient  who indicate understanding and agree with the plan and Code Status.  Code Status  FULL CODE  Likely DC to  home  Condition GUARDED    Consults called: none  Admission status:  Outpatient, pt desires to go home Time spent in minutes : 45   Jani Gravel M.D on 07/01/2017 at 10:30 PM  Between 7am to 7pm - Pager - [phone]   After 7pm go to [email or web address] - password [facility]  [facility] - Office  [phone]

## 2017-07-01 NOTE — Discharge Instructions (Addendum)
Follow-up with your doctor as soon as possible for further treatment.

## 2017-07-01 NOTE — Progress Notes (Signed)
Pt gives permission to ask questions on nursing admission hx in front of her friend. Lucius Conn BSN, RN-BC Admissions RN 07/01/2017 9:19 PM

## 2017-07-01 NOTE — ED Provider Notes (Signed)
[facility] DEPT Provider Note   CSN: [id number] Arrival date & time: 07/01/17  1732     History   Chief Complaint Chief Complaint  Patient presents with  . Abnormal Lab    HPI Lori [name] is a 77 y.o. female.  HPI Patient presented from primary care doctor for anemia.  Hemoglobin reportedly 8.7.  Has had increasing shortness of breath and fatigue over the last month.  No blood in the store black stools.  No chest pain.  Has had some swelling in her legs.  Had been on prednisone chronically.  Had been tapered off.  No chest pain.  No bleeding.  Does not bruise easily.  No vaginal bleeding. Past Medical History:  Diagnosis Date  . Arthritis   . Asthma   . Hyperlipidemia   . Hypertension   . Migraines   . Prediabetes   . Vertigo   . Vitamin D deficiency     Patient Active Problem List   Diagnosis Date Noted  . Aortic aneurysm ([place]) 06/30/2017  . Atrial fibrillation ([place]) 06/16/2017  . Vertigo due to brain injury ([place]) 02/06/2015  . BMI 31.0-31.9,adult 02/06/2015  . Medication management 11/18/2013  . Hyperlipidemia   . Prediabetes   . Vitamin D deficiency   . Asthma   . Hypertension   . Migraines     Past Surgical History:  Procedure Laterality Date  . CATARACT EXTRACTION W/ INTRAOCULAR LENS  IMPLANT, BILATERAL  2009  . CEREBRAL ANEURYSM REPAIR    . FOOT SURGERY  1993  . TUBAL LIGATION       OB History   None      Home Medications    Prior to Admission medications   Medication Sig Start Date End Date Taking? Authorizing Provider  aspirin EC 81 MG tablet Take 162 mg by mouth daily.   Yes [provider]  Cholecalciferol (VITAMIN D PO) Take 2,000-4,000 mg by mouth daily.    Yes [provider]  diazepam (VALIUM) 5 MG tablet Take 1/2 to 1 tab tid prn for anxiety. Patient taking differently: 2.5-5 mg. Take 1/2 to 1 tab tid prn for anxiety. 04/12/17  Yes Unk Pinto, MD  Multiple Vitamins-Minerals  (CENTRUM SILVER 50+WOMEN PO) Take by mouth daily.   Yes [provider]  predniSONE (DELTASONE) 10 MG tablet Take 2 tablets (20 mg total) by mouth daily with breakfast. 06/21/17  Yes Newt Minion, MD    Family History Family History  Problem Relation Age of Onset  . Hypertension Mother   . Diabetes Mother   . Cancer Mother        Pancreatic  . Cancer Father        Skin    Social History Social History   Tobacco Use  . Smoking status: Former Smoker    Last attempt to quit: 08/07/1990    Years since quitting: 26.9  . Smokeless tobacco: Never Used  Substance Use Topics  . Alcohol use: No    Alcohol/week: 0.0 oz  . Drug use: No     Allergies   Patient has no known allergies.   Review of Systems Review of Systems  Constitutional: Positive for fatigue. Negative for appetite change.  HENT: Negative for congestion.   Respiratory: Positive for shortness of breath.   Cardiovascular: Positive for leg swelling.  Gastrointestinal: Negative for abdominal pain, nausea and vomiting.  Genitourinary: Negative for flank pain.  Musculoskeletal: Negative for back pain.  Skin: Negative for wound.  Neurological: Positive for weakness and light-headedness.  Hematological: Does not bruise/bleed easily.  Psychiatric/Behavioral: Negative for confusion.     Physical Exam Updated Vital Signs BP (!) 172/82 (BP Location: Left Arm)   Pulse 78   Temp 98.2 F (36.8 C) (Oral)   Resp 13   Ht 5\' 5"  (1.651 m)   Wt 79.4 kg (175 lb)   SpO2 100%   BMI 29.12 kg/m   Physical Exam  Constitutional: She appears well-developed.  HENT:  Head: Atraumatic.  Eyes: Pupils are equal, round, and reactive to light.  Neck: Neck supple.  Cardiovascular: Normal rate.  Pulmonary/Chest: Effort normal.  Abdominal: There is no tenderness.  Musculoskeletal: She exhibits edema.  Mild bilateral lower extremity pitting edema.  Neurological: She is alert.  Skin: Capillary refill takes less than 2  seconds. There is pallor.     ED Treatments / Results  Labs (all labs ordered are listed, but only abnormal results are displayed) Labs Reviewed  RETICULOCYTES - Abnormal; Notable for the following components:      Result Value   RBC. 3.49 (*)    All other components within normal limits  CBC - Abnormal; Notable for the following components:   RBC 3.49 (*)    Hemoglobin 8.8 (*)    HCT 28.8 (*)    MCH 25.2 (*)    All other components within normal limits  VITAMIN B12  FOLATE  IRON AND TIBC  FERRITIN  POC OCCULT BLOOD, ED  TYPE AND SCREEN    EKG None  Radiology Dg Chest 2 View  Result Date: 06/30/2017 CLINICAL DATA:  Shortness of breath, leg weakness with walking and light exertion for past year; history asthma, hypertension, former smoker EXAM: CHEST - 2 VIEW COMPARISON:  None FINDINGS: Borderline enlargement of cardiac silhouette. Mediastinal contours and pulmonary vascularity normal. Lungs slightly hyperinflated but clear. No acute infiltrate, pleural effusion or pneumothorax. Eventration of the anterior RIGHT diaphragm noted. No acute osseous findings. Mild degenerative disc disease changes of the midthoracic spine. IMPRESSION: Mild hyperinflation without acute infiltrate. Electronically Signed   By: Lavonia Dana M.D.   On: 06/30/2017 12:32    Procedures Procedures (including critical care time)  Medications Ordered in ED Medications - No data to display   Initial Impression / Assessment and Plan / ED Course  I have reviewed the triage vital signs and the nursing notes.  Pertinent labs & imaging results that were available during my care of the patient were reviewed by me and considered in my medical decision making (see chart for details).     Patient with symptomatic anemia.  Hemoglobin of 8.8 down from baseline of 12 2 months ago.  Symptomatic with shortness of breath and lightheadedness.  She is not microcytic.  Will admit to hospitalist for transfusion. Final  Clinical Impressions(s) / ED Diagnoses   Final diagnoses:  Symptomatic anemia    ED Discharge Orders    None       Davonna Belling, MD 07/01/17 2036

## 2017-07-01 NOTE — ED Triage Notes (Signed)
Patient states she was sent from her PCP after having lab work drawn yesterday for feeling fatigued. Pt lab work shows hemoglobin 8.7.   Pt adds that she has dogs at home and cannot stay in the hospital over night.

## 2017-07-02 LAB — IRON AND TIBC
Iron: 19 ug/dL — ABNORMAL LOW (ref 28–170)
Saturation Ratios: 4 % — ABNORMAL LOW (ref 10.4–31.8)
TIBC: 482 ug/dL — ABNORMAL HIGH (ref 250–450)
UIBC: 463 ug/dL

## 2017-07-02 LAB — FOLATE: FOLATE: 15.6 ng/mL (ref >5.9)

## 2017-07-02 LAB — VITAMIN B12: VITAMIN B 12: 493 pg/mL (ref 180–914)

## 2017-07-02 LAB — FERRITIN: Ferritin: 5 ng/mL — ABNORMAL LOW (ref 11–307)

## 2017-07-03 ENCOUNTER — Encounter: Payer: Self-pay | Admitting: Internal Medicine

## 2017-07-03 NOTE — Patient Instructions (Signed)
   Follow-Up pending Labs.

## 2017-07-04 LAB — COMPLETE METABOLIC PANEL WITH GFR
AG RATIO: 2.3 (calc) (ref 1.0–2.5)
ALT: 10 U/L (ref 6–29)
AST: 14 U/L (ref 10–35)
Albumin: 4.2 g/dL (ref 3.6–5.1)
Alkaline phosphatase (APISO): 89 U/L (ref 33–130)
BILIRUBIN TOTAL: 0.3 mg/dL (ref 0.2–1.2)
BUN: 20 mg/dL (ref 7–25)
CALCIUM: 9.2 mg/dL (ref 8.6–10.4)
CHLORIDE: 109 mmol/L (ref 98–110)
CO2: 29 mmol/L (ref 20–32)
Creat: 0.78 mg/dL (ref 0.60–0.93)
GFR, EST AFRICAN AMERICAN: 87 mL/min/{1.73_m2} (ref >=60)
GFR, EST NON AFRICAN AMERICAN: 75 mL/min/{1.73_m2} (ref >=60)
GLOBULIN: 1.8 g/dL — AB (ref 1.9–3.7)
Glucose, Bld: 102 mg/dL — ABNORMAL HIGH (ref 65–99)
POTASSIUM: 4.5 mmol/L (ref 3.5–5.3)
Sodium: 143 mmol/L (ref 135–146)
TOTAL PROTEIN: 6 g/dL — AB (ref 6.1–8.1)

## 2017-07-04 LAB — B. BURGDORFI ANTIBODIES: B burgdorferi Ab IgG+IgM: <0.9 index

## 2017-07-04 LAB — CBC WITH DIFFERENTIAL/PLATELET
BASOS PCT: 1.1 %
Basophils Absolute: 50 cells/uL (ref 0–200)
EOS ABS: 50 {cells}/uL (ref 15–500)
Eosinophils Relative: 1.1 %
HEMATOCRIT: 28.3 % — AB (ref 35.0–45.0)
Hemoglobin: 8.7 g/dL — ABNORMAL LOW (ref 11.7–15.5)
LYMPHS ABS: 968 {cells}/uL (ref 850–3900)
MCH: 24.8 pg — AB (ref 27.0–33.0)
MCHC: 30.7 g/dL — ABNORMAL LOW (ref 32.0–36.0)
MCV: 80.6 fL (ref 80.0–100.0)
MPV: 12.5 fL (ref 7.5–12.5)
Monocytes Relative: 11.7 %
NEUTROS PCT: 64.6 %
Neutro Abs: 2907 cells/uL (ref 1500–7800)
PLATELETS: 262 10*3/uL (ref 140–400)
RBC: 3.51 10*6/uL — AB (ref 3.80–5.10)
RDW: 13.8 % (ref 11.0–15.0)
TOTAL LYMPHOCYTE: 21.5 %
WBC: 4.5 10*3/uL (ref 3.8–10.8)
WBCMIX: 527 {cells}/uL (ref 200–950)

## 2017-07-04 LAB — MAGNESIUM: Magnesium: 2.3 mg/dL (ref 1.5–2.5)

## 2017-07-04 LAB — C-REACTIVE PROTEIN: CRP: 1 mg/L (ref <8.0)

## 2017-07-04 LAB — ALDOLASE: Aldolase: 3.4 U/L (ref <=8.1)

## 2017-07-04 LAB — SEDIMENTATION RATE: Sed Rate: 11 mm/h (ref 0–30)

## 2017-07-04 LAB — CK: Total CK: 59 U/L (ref 29–143)

## 2017-07-04 LAB — MYASTHENIA GRAVIS PANEL 2: Acetylchol Modul Ab: 18 % Inhibition

## 2017-07-19 NOTE — Progress Notes (Signed)
MEDICARE ANNUAL WELLNESS VISIT AND 3 MONTH FOLLOW UP  Assessment:   Encounter for Medicare annual wellness exam 1 YEAR  Essential hypertension - continue medications, DASH diet, exercise and monitor at home. Call if greater than 130/80.  -     CBC with Differential/Platelet -     COMPLETE METABOLIC PANEL WITH GFR -     TSH  Chronic atrial fibrillation ([name]) Declines anticoagulants, understands risk of stroke, NSR at this time, rate controlled  Abdominal aortic aneurysm (AAA) without rupture ([name]) Control blood pressure, cholesterol, glucose, increase exercise.   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   Medication management  Symptomatic anemia Continue iron -     Iron,Total/Total Iron Binding Cap  Migraine without status migrainosus, not intractable, unspecified migraine type Controlled at this time  Abnormal glucose Monitor  Uncomplicated asthma, unspecified asthma severity, unspecified whether persistent Controlled  Vitamin D deficiency Continue supplement  BMI 32.0-32.9,adult  Overweight  - long discussion about weight loss, diet, and exercise -recommended diet heavy in fruits and veggies and low in animal meats, cheeses, and dairy products  Senile purpura ([place]) Discussed process, protect skin, sunscreen  Toenail fungus -     terbinafine (LAMISIL) 250 MG tablet; Take 1 tablet (250 mg total) by mouth daily. Take one daily for 3 months, need liver function lab at 6 weeks. - will refer to podiatrist if not better   Patient declines most exam/vaccines- states want to enjoy what times he has and would declines any treatments.   Over 40 minutes of exam, counseling, chart review and critical decision making was performed Future Appointments  Date Time Provider [place]  08/31/2017 11:00 AM [facility] NURSE [facility] None  10/29/2017 11:30  AM Unk Pinto, MD [facility] None  05/06/2018 10:00 AM Unk Pinto, MD [facility] None  08/04/2018 11:15 AM Vicie Mutters, PA-C [facility] None     Plan:   During the course of the visit the patient was educated and counseled about appropriate screening and preventive services including:    Pneumococcal vaccine   Prevnar 13  Influenza vaccine  Td vaccine  Screening electrocardiogram  Bone densitometry screening  Colorectal cancer screening  Diabetes screening  Glaucoma screening  Nutrition counseling   Advanced directives: requested   Subjective:  Lori [name] is a 77 y.o. female who presents for Medicare Annual Wellness Visit and follow up  She has bilateral knee pain, gives her some pain with walking, has seen ortho, on prednisone from them.  She has bilateral toenail fungus on her big.   Her blood pressure has been controlled at home, today their BP is BP: 118/64  She does not workout but is very active with 35 dogs and goats. She denies chest pain, shortness of breath, dizziness.  In 2009, she had a false (+) Myoview confirmed by a Negative Heart Cath. She has history of Afib, CHA2DS2VASC 3, she adamantly declined Warfarin /NOAC's, understands risk of stroke.   She is on valium for vertigo and anxiety.   She is not on cholesterol medication and denies myalgias. Her cholesterol is at goal. The cholesterol last visit was:   Lab Results  Component Value Date   CHOL 151 04/09/2017   HDL 47 (L) 04/09/2017   LDLCALC 80 04/09/2017   TRIG 140 04/09/2017   CHOLHDL 3.2 04/09/2017    She has been working on diet and exercise for prediabetes, and denies  polydipsia, polyuria and visual disturbances. Last A1C in the office was:  Lab Results  Component Value Date   HGBA1C 5.5 04/09/2017   Lab Results  Component Value Date   GFRNONAA 75 06/30/2017   Patient is on Vitamin D supplement.   Lab Results  Component Value Date   VD25OH 37 04/09/2017      BMI is Body mass index is 31.99 kg/m., she is working on diet and exercise. Wt Readings from Last 3 Encounters:  07/20/17 180 lb 9.6 oz (81.9 kg)  07/01/17 175 lb (79.4 kg)  06/30/17 180 lb 6.4 oz (81.8 kg)    Medication Review: Current Outpatient Medications on File Prior to Visit  Medication Sig Dispense Refill  . aspirin EC 81 MG tablet Take 162 mg by mouth daily.    . Cholecalciferol (VITAMIN D PO) Take 2,000-4,000 mg by mouth daily.     . diazepam (VALIUM) 5 MG tablet Take 1/2 to 1 tab tid prn for anxiety. (Patient taking differently: 2.5-5 mg. Take 1/2 to 1 tab tid prn for anxiety.) 90 tablet 1  . Multiple Vitamins-Minerals (CENTRUM SILVER 50+WOMEN PO) Take by mouth daily.    . predniSONE (DELTASONE) 10 MG tablet Take 2 tablets (20 mg total) by mouth daily with breakfast. 60 tablet 0  . TURMERIC PO Take by mouth.     No current facility-administered medications on file prior to visit.     No Known Allergies  Current Problems (verified) Patient Active Problem List   Diagnosis Date Noted  . Senile purpura ([place]) 07/20/2017  . Symptomatic anemia 07/01/2017  . Aortic aneurysm ([place]) 06/30/2017  . Atrial fibrillation ([place]) 06/16/2017  . Vertigo due to brain injury ([place]) 02/06/2015  . Medication management 11/18/2013  . Hyperlipidemia   . Abnormal glucose   . Vitamin D deficiency   . Asthma   . Hypertension   . Migraines    Screening Tests Immunization History  Administered Date(s) Administered  . DT 01/24/2014  . Pneumococcal Polysaccharide-23 10/06/2011  . Td 04/23/2003   Preventative care: Last colonoscopy: 2013 Last mammogram: 09/2012 - she states if she did have cancer she would not want treatment Last pap smear/pelvic exam: 2009 DEXA:2011  Prior vaccinations: TD  2015  Influenza: declines Pneumococcal: 2013 Prevnar13: declines Shingles/Zostavax: declines  Names of Other Physician/Practitioners you currently use: 1. [facility] here for primary care 2. [place], eye doctor, last visit June 2017 3. Needs one, dentist, remote Patient Care Team: Unk Pinto, MD as PCP - General (Internal Medicine) Unk Pinto, MD as PCP - Internal Medicine (Internal Medicine) Lafayette Dragon, MD (Inactive) as Consulting Physician (Gastroenterology) Clent Jacks, MD as Consulting Physician (Ophthalmology)  SURGICAL HISTORY She  has a past surgical history that includes Tubal ligation; Cerebral aneurysm repair; Cataract extraction w/ intraocular lens  implant, bilateral (2009); and Foot surgery (1993). FAMILY HISTORY Her family history includes Cancer in her father and mother; Diabetes in her mother; Hypertension in her mother. SOCIAL HISTORY She  reports that she quit smoking about 26 years ago. She has never used smokeless tobacco. She reports that she does not drink alcohol or use drugs.   MEDICARE WELLNESS OBJECTIVES: Physical activity: Current Exercise Habits: The patient does not participate in regular exercise at present(very active with her animals and in the yard) Cardiac risk factors: Cardiac Risk Factors include: advanced age (>56men, >54 women);dyslipidemia;sedentary lifestyle;obesity (BMI >30kg/m2);hypertension Depression/mood screen:   Depression screen [facility] 2/9 07/20/2017  Decreased Interest 0  Down, Depressed, Hopeless 0  PHQ - 2 Score 0    ADLs:  In your present state of health, do you have any difficulty performing the following activities: 07/20/2017 07/03/2017  Hearing? N N  Comment - -  Vision? N N  Difficulty concentrating or making decisions? N N  Walking or climbing stairs? N N  Dressing or bathing? N N  Doing errands, shopping? N N  Some recent data might be hidden     Cognitive Testing  Alert? Yes  Normal Appearance?Yes  Oriented to person? Yes  Place? Yes   Time? Yes  Recall of three objects?  Yes  Can perform simple calculations? Yes  Displays appropriate  judgment?Yes  Can read the correct time from a watch face?Yes  EOL planning: Would patient like information on creating a medical advance directive?: Yes (MAU/Ambulatory/Procedural Areas - Information given)  Review of Systems  Constitutional: Negative.   HENT: Negative.   Eyes: Negative.   Respiratory: Negative.   Cardiovascular: Negative.   Gastrointestinal: Negative.   Genitourinary: Negative.   Musculoskeletal: Negative.   Skin: Negative.      Objective:     Today's Vitals   07/20/17 1046  BP: 118/64  Pulse: 80  Resp: 14  Temp: 97.7 F (36.5 C)  SpO2: 98%  Weight: 180 lb 9.6 oz (81.9 kg)  Height: 5\' 3"  (1.6 m)  PainSc: 5    Body mass index is 31.99 kg/m.  General appearance: alert, no distress, WD/WN, female HEENT: normocephalic, sclerae anicteric, TMs pearly, nares patent, no discharge or erythema, pharynx normal Oral cavity: MMM, no lesions Neck: supple, no lymphadenopathy, no thyromegaly, no masses Heart: RRR, normal S1, S2, no murmurs Lungs: CTA bilaterally, no wheezes, rhonchi, or rales Abdomen: +bs, soft, non tender, non distended, no masses, no hepatomegaly, no splenomegaly Musculoskeletal: nontender, no swelling, no obvious deformity Extremities: no edema, no cyanosis, no clubbing Pulses: 2+ symmetric, upper and lower extremities, normal cap refill Neurological: alert, oriented x 3, CN2-12 intact, strength normal upper extremities and lower extremities, sensation normal throughout, DTRs 2+ throughout, no cerebellar signs, gait normal Psychiatric: normal affect, behavior normal, pleasant   Medicare Attestation I have personally reviewed: The patient's medical and social history Their use of alcohol, tobacco or illicit drugs Their current medications and supplements The patient's functional ability including ADLs,fall risks, home safety risks, cognitive, and hearing and visual impairment Diet and physical activities Evidence for depression or mood  disorders  The patient's weight, height, BMI, and visual acuity have been recorded in the chart.  I have made referrals, counseling, and provided education to the patient based on review of the above and I have provided the patient with a written personalized care plan for preventive services.     Vicie Mutters, PA-C   07/20/2017

## 2017-07-20 ENCOUNTER — Ambulatory Visit (INDEPENDENT_AMBULATORY_CARE_PROVIDER_SITE_OTHER): Payer: PPO | Admitting: Physician Assistant

## 2017-07-20 ENCOUNTER — Encounter: Payer: Self-pay | Admitting: Physician Assistant

## 2017-07-20 ENCOUNTER — Ambulatory Visit: Payer: Self-pay | Admitting: Adult Health

## 2017-07-20 VITALS — BP 118/64 | HR 80 | Temp 97.7°F | Resp 14 | Ht 63.0 in | Wt 180.6 lb

## 2017-07-20 DIAGNOSIS — Z0001 Encounter for general adult medical examination with abnormal findings: Secondary | ICD-10-CM | POA: Diagnosis not present

## 2017-07-20 DIAGNOSIS — E559 Vitamin D deficiency, unspecified: Secondary | ICD-10-CM

## 2017-07-20 DIAGNOSIS — D692 Other nonthrombocytopenic purpura: Secondary | ICD-10-CM

## 2017-07-20 DIAGNOSIS — R42 Dizziness and giddiness: Secondary | ICD-10-CM

## 2017-07-20 DIAGNOSIS — E782 Mixed hyperlipidemia: Secondary | ICD-10-CM

## 2017-07-20 DIAGNOSIS — S069X9A Unspecified intracranial injury with loss of consciousness of unspecified duration, initial encounter: Secondary | ICD-10-CM

## 2017-07-20 DIAGNOSIS — Z6832 Body mass index (BMI) 32.0-32.9, adult: Secondary | ICD-10-CM

## 2017-07-20 DIAGNOSIS — D649 Anemia, unspecified: Secondary | ICD-10-CM

## 2017-07-20 DIAGNOSIS — R7309 Other abnormal glucose: Secondary | ICD-10-CM | POA: Diagnosis not present

## 2017-07-20 DIAGNOSIS — Z0000 Encounter for general adult medical examination without abnormal findings: Secondary | ICD-10-CM

## 2017-07-20 DIAGNOSIS — B351 Tinea unguium: Secondary | ICD-10-CM

## 2017-07-20 DIAGNOSIS — J45909 Unspecified asthma, uncomplicated: Secondary | ICD-10-CM | POA: Diagnosis not present

## 2017-07-20 DIAGNOSIS — I714 Abdominal aortic aneurysm, without rupture, unspecified: Secondary | ICD-10-CM

## 2017-07-20 DIAGNOSIS — Z79899 Other long term (current) drug therapy: Secondary | ICD-10-CM

## 2017-07-20 DIAGNOSIS — G43909 Migraine, unspecified, not intractable, without status migrainosus: Secondary | ICD-10-CM | POA: Diagnosis not present

## 2017-07-20 DIAGNOSIS — I482 Chronic atrial fibrillation, unspecified: Secondary | ICD-10-CM

## 2017-07-20 DIAGNOSIS — R6889 Other general symptoms and signs: Secondary | ICD-10-CM | POA: Diagnosis not present

## 2017-07-20 DIAGNOSIS — I10 Essential (primary) hypertension: Secondary | ICD-10-CM

## 2017-07-20 HISTORY — DX: Other nonthrombocytopenic purpura: D69.2

## 2017-07-20 LAB — CBC WITH DIFFERENTIAL/PLATELET
BASOS PCT: 0.7 %
Basophils Absolute: 41 cells/uL (ref 0–200)
EOS PCT: 0.3 %
Eosinophils Absolute: 17 cells/uL (ref 15–500)
HCT: 33.4 % — ABNORMAL LOW (ref 35.0–45.0)
HEMOGLOBIN: 10.4 g/dL — AB (ref 11.7–15.5)
Lymphs Abs: 708 cells/uL — ABNORMAL LOW (ref 850–3900)
MCH: 25.8 pg — ABNORMAL LOW (ref 27.0–33.0)
MCHC: 31.1 g/dL — ABNORMAL LOW (ref 32.0–36.0)
MCV: 82.9 fL (ref 80.0–100.0)
MONOS PCT: 8.8 %
MPV: 12.6 fL — AB (ref 7.5–12.5)
Neutro Abs: 4524 cells/uL (ref 1500–7800)
Neutrophils Relative %: 78 %
Platelets: 223 10*3/uL (ref 140–400)
RBC: 4.03 10*6/uL (ref 3.80–5.10)
RDW: 18 % — ABNORMAL HIGH (ref 11.0–15.0)
Total Lymphocyte: 12.2 %
WBC mixed population: 510 cells/uL (ref 200–950)
WBC: 5.8 10*3/uL (ref 3.8–10.8)

## 2017-07-20 LAB — COMPLETE METABOLIC PANEL WITH GFR
AG Ratio: 2 (calc) (ref 1.0–2.5)
ALKALINE PHOSPHATASE (APISO): 70 U/L (ref 33–130)
ALT: 11 U/L (ref 6–29)
AST: 15 U/L (ref 10–35)
Albumin: 4.2 g/dL (ref 3.6–5.1)
BUN/Creatinine Ratio: 22 (calc) (ref 6–22)
BUN: 21 mg/dL (ref 7–25)
CO2: 27 mmol/L (ref 20–32)
CREATININE: 0.95 mg/dL — AB (ref 0.60–0.93)
Calcium: 9.1 mg/dL (ref 8.6–10.4)
Chloride: 108 mmol/L (ref 98–110)
GFR, Est African American: 68 mL/min/{1.73_m2} (ref >=60)
GFR, Est Non African American: 59 mL/min/{1.73_m2} — ABNORMAL LOW (ref >=60)
GLUCOSE: 97 mg/dL (ref 65–99)
Globulin: 2.1 g/dL (calc) (ref 1.9–3.7)
Potassium: 3.7 mmol/L (ref 3.5–5.3)
Sodium: 144 mmol/L (ref 135–146)
TOTAL PROTEIN: 6.3 g/dL (ref 6.1–8.1)
Total Bilirubin: 0.4 mg/dL (ref 0.2–1.2)

## 2017-07-20 LAB — IRON, TOTAL/TOTAL IRON BINDING CAP
%SAT: 86 % — AB (ref 11–50)
Iron: 351 ug/dL — ABNORMAL HIGH (ref 45–160)
TIBC: 406 ug/dL (ref 250–450)

## 2017-07-20 LAB — TSH: TSH: 3.56 m[IU]/L (ref 0.40–4.50)

## 2017-07-20 MED ORDER — TERBINAFINE HCL 250 MG PO TABS: 250.0000 mg | ORAL_TABLET | Freq: Every day | ORAL | 0 refills | Status: AC

## 2017-07-20 NOTE — Patient Instructions (Addendum)
Okay I will send in lamisil for you to take. You can only get it from [facility] or [employer] will not cover it.The lamisil is taken once a day for  months and then if can be taken for several months after for a month on it and month off it. It gets processed through you liver so we need to check your liver function at 6 weeks after taking the drug. It can take up to 6 months to a year for your toenails to get better.   Follow up with your orthopedic doctor for your knees    Take omeprazole over the counter for 2 weeks, then go to zantac 150-300 mg OR pepcid 20 or 40mg  at night for 2 weeks, then you can stop or continue as needed.  Avoid alcohol, spicy foods, NSAIDS (aleve, ibuprofen) at this time. See foods below.   Food Choices for Gastroesophageal Reflux Disease When you have gastroesophageal reflux disease (GERD), the foods you eat and your eating habits are very important. Choosing the right foods can help ease the discomfort of GERD. WHAT GENERAL GUIDELINES DO I NEED TO FOLLOW?  Choose fruits, vegetables, whole grains, low-fat dairy products, and low-fat meat, fish, and poultry.  Limit fats such as oils, salad dressings, butter, nuts, and avocado.  Keep a food diary to identify foods that cause symptoms.  Avoid foods that cause reflux. These may be different for different people.  Eat frequent small meals instead of three large meals each day.  Eat your meals slowly, in a relaxed setting.  Limit fried foods.  Cook foods using methods other than frying.  Avoid drinking alcohol.  Avoid drinking large amounts of liquids with your meals.  Avoid bending over or lying down until 2-3 hours after eating. WHAT FOODS ARE NOT RECOMMENDED? The following are some foods and drinks that may worsen your symptoms: Vegetables Tomatoes. Tomato juice. Tomato and spaghetti sauce. Chili peppers. Onion and garlic. Horseradish. Fruits Oranges, grapefruit, and lemon (fruit and  juice). Meats High-fat meats, fish, and poultry. This includes hot dogs, ribs, ham, sausage, salami, and bacon. Dairy Whole milk and chocolate milk. Sour cream. Cream. Butter. Ice cream. Cream cheese.  Beverages Coffee and tea, with or without caffeine. Carbonated beverages or energy drinks. Condiments Hot sauce. Barbecue sauce.  Sweets/Desserts Chocolate and cocoa. Donuts. Peppermint and spearmint. Fats and Oils High-fat foods, including [place] fries and potato chips. Other Vinegar. Strong spices, such as black pepper, white pepper, red pepper, cayenne, curry powder, cloves, ginger, and chili powder.    Atrial Fibrillation Atrial fibrillation is a type of heartbeat that is irregular or fast (rapid). If you have this condition, your heart keeps quivering in a weird (chaotic) way. This condition can make it so your heart cannot pump blood normally. Having this condition gives a person more risk for stroke, heart failure, and other heart problems. There are different types of atrial fibrillation. Talk with your doctor to learn about the type that you have. Follow these instructions at home:  Take over-the-counter and prescription medicines only as told by your doctor.  If your doctor prescribed a blood-thinning medicine, take it exactly as told. Taking too much of it can cause bleeding. If you do not take enough of it, you will not have the protection that you need against stroke and other problems.  Do not use any tobacco products. These include cigarettes, chewing tobacco, and e-cigarettes. If you need help quitting, ask your doctor.  If you have  apnea (obstructive sleep apnea), manage it as told by your doctor.  Do not drink alcohol.  Do not drink beverages that have caffeine. These include coffee, soda, and tea.  Maintain a healthy weight. Do not use diet pills unless your doctor says they are safe for you. Diet pills may make heart problems worse.  Follow diet instructions as  told by your doctor.  Exercise regularly as told by your doctor.  Keep all follow-up visits as told by your doctor. This is important. Contact a doctor if:  You notice a change in the speed, rhythm, or strength of your heartbeat.  You are taking a blood-thinning medicine and you notice more bruising.  You get tired more easily when you move or exercise. Get help right away if:  You have pain in your chest or your belly (abdomen).  You have sweating or weakness.  You feel sick to your stomach (nauseous).  You notice blood in your throw up (vomit), poop (stool), or pee (urine).  You are short of breath.  You suddenly have swollen feet and ankles.  You feel dizzy.  Your suddenly get weak or numb in your face, arms, or legs, especially if it happens on one side of your body.  You have trouble talking, trouble understanding, or both.  Your face or your eyelid droops on one side. These symptoms may be an emergency. Do not wait to see if the symptoms will go away. Get medical help right away. Call your local emergency services (911 in the [place] Do not drive yourself to the hospital. This information is not intended to replace advice given to you by your health care provider. Make sure you discuss any questions you have with your health care provider. Document Released: 12/10/2007 Document Revised: 08/08/2015 Document Reviewed: 06/27/2014 Elsevier Interactive Patient Education  [employer].

## 2017-08-30 ENCOUNTER — Ambulatory Visit (INDEPENDENT_AMBULATORY_CARE_PROVIDER_SITE_OTHER): Payer: PPO

## 2017-08-30 VITALS — Ht 63.0 in | Wt 185.4 lb

## 2017-08-30 DIAGNOSIS — B351 Tinea unguium: Secondary | ICD-10-CM

## 2017-08-30 LAB — HEPATIC FUNCTION PANEL
AG RATIO: 2.5 (calc) (ref 1.0–2.5)
ALT: 14 U/L (ref 6–29)
AST: 17 U/L (ref 10–35)
Albumin: 4.2 g/dL (ref 3.6–5.1)
Alkaline phosphatase (APISO): 77 U/L (ref 33–130)
BILIRUBIN DIRECT: 0.1 mg/dL (ref 0.0–0.2)
BILIRUBIN INDIRECT: 0.1 mg/dL — AB (ref 0.2–1.2)
BILIRUBIN TOTAL: 0.2 mg/dL (ref 0.2–1.2)
Globulin: 1.7 g/dL (calc) — ABNORMAL LOW (ref 1.9–3.7)
Total Protein: 5.9 g/dL — ABNORMAL LOW (ref 6.1–8.1)

## 2017-08-30 NOTE — Progress Notes (Signed)
Pt present for HFP labs due to taking LAMISIL. Pt has been on it for 6wks now.

## 2017-08-31 ENCOUNTER — Ambulatory Visit: Payer: Self-pay

## 2017-09-08 ENCOUNTER — Encounter (INDEPENDENT_AMBULATORY_CARE_PROVIDER_SITE_OTHER): Payer: Self-pay | Admitting: Family

## 2017-09-08 ENCOUNTER — Ambulatory Visit (INDEPENDENT_AMBULATORY_CARE_PROVIDER_SITE_OTHER): Payer: PPO | Admitting: Family

## 2017-09-08 VITALS — Ht 63.0 in | Wt 185.0 lb

## 2017-09-08 DIAGNOSIS — M5416 Radiculopathy, lumbar region: Secondary | ICD-10-CM

## 2017-09-08 DIAGNOSIS — M79605 Pain in left leg: Secondary | ICD-10-CM | POA: Diagnosis not present

## 2017-09-08 DIAGNOSIS — M79604 Pain in right leg: Secondary | ICD-10-CM

## 2017-09-08 NOTE — Progress Notes (Signed)
Office Visit Note   Patient: Lori [name]           Date of Birth: [birth year]           MRN: [id number] Visit Date: 09/08/2017              Requested by: Unk Pinto, [address] [place],  [id number] PCP: Unk Pinto, MD  Chief Complaint  Patient presents with  . Right Leg - Follow-up  . Left Leg - Follow-up      HPI: Patient is a 77 year old woman who is seen today in follow up for radicular pain down both lower extremities.  she states occasionally has hip pain bilaterally knee pain bilaterally.  She states occasionally she has to manually lift her leg to get her leg in the car.  She states she has weakness in both lower extremities as well as restless leg.  She uses a walker at times.  She states occasionally pain radiates from the buttocks down the legs and that the pain can come and go at all time she also complains of arthritic pain of both hands.  She is currently taking 2 Aleve twice a day.  Did have relief of her back pain and radicular symptoms while taking the Prednisone.  Assessment & Plan: Visit Diagnoses:  1. Bilateral leg pain   2. Radiculopathy, lumbar region     Plan: Continue her on a low-dose prednisone. we will set her up for an MRI scan of the lumbar spine.   Follow-Up Instructions: No follow-ups on file.   Ortho Exam  Patient is alert, oriented, no adenopathy, well-dressed, normal affect, normal respiratory effort. Examination patient has an antalgic gait.  She has a negative straight leg raise and no focal motor weakness in either lower extremity.     Imaging: No results found. No images are attached to the encounter.  Labs: Lab Results  Component Value Date   HGBA1C 5.5 04/09/2017   HGBA1C 5.4 10/06/2016   HGBA1C 5.4 02/11/2016   ESRSEDRATE 11 06/30/2017   CRP 1.0 06/30/2017    @LABSALLVALUES (HGBA1)@  Body mass index is 32.77 kg/m.  Orders:  Orders Placed This Encounter  Procedures  . MR  Lumbar Spine w/o contrast   No orders of the defined types were placed in this encounter.    Procedures: No procedures performed  Clinical Data: No additional findings.  ROS:  All other systems negative, except as noted in the HPI. Review of Systems  Constitutional: Negative for chills and fever.  Musculoskeletal: Positive for myalgias. Negative for back pain.  Neurological: Positive for numbness. Negative for weakness.    Objective: Vital Signs: Ht 5\' 3"  (1.6 m)   Wt 185 lb (83.9 kg)   BMI 32.77 kg/m   Specialty Comments:  No specialty comments available.  PMFS History: Patient Active Problem List   Diagnosis Date Noted  . Senile purpura ([place]) 07/20/2017  . Symptomatic anemia 07/01/2017  . Aortic aneurysm ([place]) 06/30/2017  . Atrial fibrillation ([place]) 06/16/2017  . Vertigo due to brain injury ([place]) 02/06/2015  . Medication management 11/18/2013  . Hyperlipidemia   . Abnormal glucose   . Vitamin D deficiency   . Asthma   . Hypertension   . Migraines    Past Medical History:  Diagnosis Date  . Arthritis   . Asthma   . Hyperlipidemia   . Hypertension   . Migraines   . Prediabetes   . Vertigo   . Vitamin D  deficiency     Family History  Problem Relation Age of Onset  . Hypertension Mother   . Diabetes Mother   . Cancer Mother        Pancreatic  . Cancer Father        Skin    Past Surgical History:  Procedure Laterality Date  . CATARACT EXTRACTION W/ INTRAOCULAR LENS  IMPLANT, BILATERAL  2009  . CEREBRAL ANEURYSM REPAIR    . FOOT SURGERY  1993  . TUBAL LIGATION     Social History   Occupational History  . Not on file  Tobacco Use  . Smoking status: Former Smoker    Last attempt to quit: 08/07/1990    Years since quitting: 27.1  . Smokeless tobacco: Never Used  Substance and Sexual Activity  . Alcohol use: No    Alcohol/week: 0.0 oz  . Drug use: No  . Sexual activity: Not on file

## 2017-09-10 ENCOUNTER — Telehealth (INDEPENDENT_AMBULATORY_CARE_PROVIDER_SITE_OTHER): Payer: Self-pay | Admitting: Radiology

## 2017-09-10 NOTE — Telephone Encounter (Signed)
Called patient with MRI appointment time. Monday July 8th at 12pm.  Arrive at 11:30am to check in. Gave scheduling callback [phone] for directions and if appointment time needs to be changed.

## 2017-09-14 ENCOUNTER — Other Ambulatory Visit (INDEPENDENT_AMBULATORY_CARE_PROVIDER_SITE_OTHER): Payer: Self-pay | Admitting: Orthopedic Surgery

## 2017-09-14 ENCOUNTER — Telehealth (INDEPENDENT_AMBULATORY_CARE_PROVIDER_SITE_OTHER): Payer: Self-pay

## 2017-09-14 MED ORDER — PREDNISONE 10 MG PO TABS: 20.0000 mg | ORAL_TABLET | Freq: Every day | ORAL | 0 refills | Status: DC

## 2017-09-14 NOTE — Telephone Encounter (Signed)
Needs order for prednisone called to [facility].

## 2017-09-14 NOTE — Telephone Encounter (Signed)
This pt saw Junie Panning last week for LBP with rad s/s pt called and states that rx for prednisone was to be called in. I do not see a note can you write rx for pred taper for pt?

## 2017-09-14 NOTE — Telephone Encounter (Signed)
rx sent

## 2017-09-15 ENCOUNTER — Ambulatory Visit (INDEPENDENT_AMBULATORY_CARE_PROVIDER_SITE_OTHER): Payer: PPO | Admitting: Family

## 2017-09-20 ENCOUNTER — Ambulatory Visit (HOSPITAL_COMMUNITY)
Admission: RE | Admit: 2017-09-20 | Discharge: 2017-09-20 | Disposition: A | Discharge status: Home / Self Care | Payer: PPO | Source: Ambulatory Visit | Attending: Family | Admitting: Family

## 2017-09-20 ENCOUNTER — Encounter (INDEPENDENT_AMBULATORY_CARE_PROVIDER_SITE_OTHER): Payer: Self-pay | Admitting: Family

## 2017-09-20 DIAGNOSIS — M545 Low back pain: Secondary | ICD-10-CM | POA: Diagnosis not present

## 2017-09-20 DIAGNOSIS — M79605 Pain in left leg: Secondary | ICD-10-CM | POA: Diagnosis not present

## 2017-09-20 DIAGNOSIS — M48061 Spinal stenosis, lumbar region without neurogenic claudication: Secondary | ICD-10-CM | POA: Insufficient documentation

## 2017-09-20 DIAGNOSIS — M5416 Radiculopathy, lumbar region: Secondary | ICD-10-CM | POA: Diagnosis not present

## 2017-09-20 DIAGNOSIS — M79604 Pain in right leg: Secondary | ICD-10-CM | POA: Diagnosis not present

## 2017-09-20 DIAGNOSIS — M4807 Spinal stenosis, lumbosacral region: Secondary | ICD-10-CM | POA: Insufficient documentation

## 2017-09-20 DIAGNOSIS — M5126 Other intervertebral disc displacement, lumbar region: Secondary | ICD-10-CM | POA: Insufficient documentation

## 2017-10-29 ENCOUNTER — Ambulatory Visit (INDEPENDENT_AMBULATORY_CARE_PROVIDER_SITE_OTHER): Payer: PPO | Admitting: Internal Medicine

## 2017-10-29 VITALS — BP 132/82 | HR 60 | Temp 97.3°F | Resp 18 | Ht 64.0 in | Wt 179.8 lb

## 2017-10-29 DIAGNOSIS — E782 Mixed hyperlipidemia: Secondary | ICD-10-CM

## 2017-10-29 DIAGNOSIS — Z79899 Other long term (current) drug therapy: Secondary | ICD-10-CM

## 2017-10-29 DIAGNOSIS — S069X9A Unspecified intracranial injury with loss of consciousness of unspecified duration, initial encounter: Secondary | ICD-10-CM

## 2017-10-29 DIAGNOSIS — R7309 Other abnormal glucose: Secondary | ICD-10-CM

## 2017-10-29 DIAGNOSIS — E559 Vitamin D deficiency, unspecified: Secondary | ICD-10-CM

## 2017-10-29 DIAGNOSIS — I480 Paroxysmal atrial fibrillation: Secondary | ICD-10-CM

## 2017-10-29 DIAGNOSIS — I10 Essential (primary) hypertension: Secondary | ICD-10-CM | POA: Diagnosis not present

## 2017-10-29 DIAGNOSIS — R42 Dizziness and giddiness: Secondary | ICD-10-CM | POA: Diagnosis not present

## 2017-10-29 DIAGNOSIS — S069XAA Unspecified intracranial injury with loss of consciousness status unknown, initial encounter: Secondary | ICD-10-CM

## 2017-10-29 NOTE — Progress Notes (Signed)
This very nice 77 y.o. DWF presents for 3 month follow up with HTN, HLD, Pre-Diabetes and Vitamin D Deficiency. In 2001, she had surgery for a Brain Aneurysm and reports intermittant vertigo.      She has ongoing pains in her LE's from her groins to her shins and has been evaluated by Dr Sharol Given. Lumbar MRI was done & showed Spinal stenosis and DDD with mild L4/L5 disc protrusion Patient is being treated with Prednisone 20 mg daily with improvement in her sx's.      Patient is treated for HTN (2009) & BP has been controlled at home. Today's BP is at goal -  132/82.  Patient had a false (+) Myoview in 2009 confirmed by a Negative/Normal Heart Cath.  In Jan 2019  EKG showed new pAfib (CHA2DS2VASC 3)  And she adamantly declined either Coumadin or a NOAC opting to take 2 bASA/daily. Patient has had no complaints of any cardiac type chest pain, palpitations, dyspnea / orthopnea / PND, dizziness, claudication, or dependent edema.     Hyperlipidemia is controlled with diet. Current Lipids are at goal: Lab Results  Component Value Date   CHOL 159 10/29/2017   HDL 49 (L) 10/29/2017   LDLCALC 89 10/29/2017   TRIG 112 10/29/2017   CHOLHDL 3.2 10/29/2017      Also, the patient has history of PreDiabetes (A1c 5.9%/2013) and has had no symptoms of reactive hypoglycemia, diabetic polys, paresthesias or visual blurring.  Current A1c was Normal & at goal: Lab Results  Component Value Date   HGBA1C 5.4 10/29/2017      Further, the patient also has history of Vitamin D Deficiency ("11"/2008) and supplements vitamin D without any suspected side-effects. Current vitamin D is at goal: Lab Results  Component Value Date   VD25OH 80 10/29/2017   Current Outpatient Medications on File Prior to Visit  Medication Sig  . aspirin EC 81 MG tablet Take 162 mg by mouth daily.  . Cholecalciferol (VITAMIN D PO) Take 2,000-4,000 mg by mouth daily.   . diazepam (VALIUM) 5 MG tablet Take  1/2 to 1 tab tid prn for anxiety  or VERTIGO  . Multiple Vitamins-Minerals  Take by mouth daily.  . predniSONE 10 MG tablet TAKE 2 TABLETS BY MOUTH DAILY WITH BREAKFAST.  Marland Kitchen TURMERIC PO Take by mouth.   No Known Allergies   PMHx:   Past Medical History:  Diagnosis Date  . Arthritis   . Asthma   . Hyperlipidemia   . Hypertension   . Migraines   . Prediabetes   . Vertigo   . Vitamin D deficiency    Immunization History  Administered Date(s) Administered  . DT 01/24/2014  . Pneumococcal Polysaccharide-23 10/06/2011  . Td 04/23/2003   Past Surgical History:  Procedure Laterality Date  . CATARACT EXTRACTION W/ INTRAOCULAR LENS  IMPLANT, BILATERAL  2009  . CEREBRAL ANEURYSM REPAIR    . FOOT SURGERY  1993  . TUBAL LIGATION     FHx:    Reviewed / unchanged  SHx:    Reviewed / unchanged   Systems Review:  Constitutional: Denies fever, chills, wt changes, headaches, insomnia, fatigue, night sweats, change in appetite. Eyes: Denies redness, blurred vision, diplopia, discharge, itchy, watery eyes.  ENT: Denies discharge, congestion, post nasal drip, epistaxis, sore throat, earache, hearing loss, dental pain, tinnitus, vertigo, sinus pain, snoring.  CV: Denies chest pain, palpitations, irregular heartbeat, syncope, dyspnea, diaphoresis, orthopnea, PND, claudication or edema. Respiratory: denies cough,  dyspnea, DOE, pleurisy, hoarseness, laryngitis, wheezing.  Gastrointestinal: Denies dysphagia, odynophagia, heartburn, reflux, water brash, abdominal pain or cramps, nausea, vomiting, bloating, diarrhea, constipation, hematemesis, melena, hematochezia  or hemorrhoids. Genitourinary: Denies dysuria, frequency, urgency, nocturia, hesitancy, discharge, hematuria or flank pain. Musculoskeletal: Denies arthralgias, myalgias, stiffness, jt. swelling, pain, limping or strain/sprain.  Skin: Denies pruritus, rash, hives, warts, acne, eczema or change in skin lesion(s). Neuro: No weakness, tremor, incoordination, spasms,  paresthesia or pain. Psychiatric: Denies confusion, memory loss or sensory loss. Endo: Denies change in weight, skin or hair change.  Heme/Lymph: No excessive bleeding, bruising or enlarged lymph nodes.  Physical Exam  BP 132/82   Pulse 60   Temp (!) 97.3 F (36.3 C)   Resp 18   Ht 5\' 4"  (1.626 m)   Wt 179 lb 12.8 oz (81.6 kg)   BMI 30.86 kg/m   Appears  well nourished, well groomed  and in no distress.  Eyes: PERRLA, EOMs, conjunctiva no swelling or erythema. Sinuses: No frontal/maxillary tenderness ENT/Mouth: EAC's clear, TM's nl w/o erythema, bulging. Nares clear w/o erythema, swelling, exudates. Oropharynx clear without erythema or exudates. Oral hygiene is good. Tongue normal, non obstructing. Hearing intact.  Neck: Supple. Thyroid not palpable. Car 2+/2+ without bruits, nodes or JVD. Chest: Respirations nl with BS clear & equal w/o rales, rhonchi, wheezing or stridor.  Cor: Heart sounds normal w/ regular rate and rhythm without sig. murmurs, gallops, clicks or rubs. Peripheral pulses normal and equal  without edema.  Abdomen: Soft & bowel sounds normal. Non-tender w/o guarding, rebound, hernias, masses or organomegaly.  Lymphatics: Unremarkable.  Musculoskeletal: Full ROM all peripheral extremities, joint stability, 5/5 strength and normal gait.  Skin: Warm, dry without exposed rashes, lesions or ecchymosis apparent.  Neuro: Cranial nerves intact, reflexes equal bilaterally. Sensory-motor testing grossly intact. Tendon reflexes grossly intact.  Pysch: Alert & oriented x 3.  Insight and judgement nl & appropriate. No ideations.  Assessment and Plan:  1. Essential hypertension  - Continue medication, monitor blood pressure at home.  - Continue DASH diet.  Reminder to go to the ER if any CP,  SOB, nausea, dizziness, severe HA, changes vision/speech.  - CBC with Differential/Platelet - COMPLETE METABOLIC PANEL WITH GFR - Magnesium - TSH  2. Hyperlipidemia, mixed  -  Continue diet/meds, exercise,& lifestyle modifications.  - Continue monitor periodic cholesterol/liver & renal functions   - Lipid panel - TSH  3. Abnormal glucose  - Continue diet, exercise, lifestyle modifications.  - Monitor appropriate labs.  - Hemoglobin A1c - Insulin, random  4. Vitamin D deficiency  - Continue supplementation.  - VITAMIN D 25 Hydroxyl  5. Paroxysmal atrial fibrillation ([name])  - TSH  6. 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 Medication management  - CBC with Differential/Platelet - COMPLETE METABOLIC PANEL WITH GFR - Magnesium - Lipid panel - TSH - Hemoglobin A1c - Insulin, random - VITAMIN D 25 Hydroxyl       Discussed  regular exercise, BP monitoring, weight control to achieve/maintain BMI less than 25 and discussed med and SE's. Recommended labs to assess and monitor clinical status with further disposition pending results of labs. Over 30 minutes of exam, counseling, chart review was performed.

## 2017-10-29 NOTE — Patient Instructions (Addendum)
Recommend Adult Low Dose Aspirin or  coated  Aspirin 81 mg daily  To reduce risk of Colon Cancer 20 %,  Skin Cancer 26 % ,  Melanoma 46%  and  Pancreatic cancer 60% +++++++++++++++++++++++++ Vitamin D goal  is between 70-100.  Please make sure that you are taking your Vitamin D as directed.  It is very important as a natural anti-inflammatory  helping hair, skin, and nails, as well as reducing stroke and heart attack risk.  It helps your bones and helps with mood. It also decreases numerous cancer risks so please take it as directed.  Low Vit D is associated with a 200-300% higher risk for CANCER  and 200-300% higher risk for HEART   ATTACK  &  STROKE.   ...................................... It is also associated with higher death rate at younger ages,  autoimmune diseases like Rheumatoid arthritis, Lupus, Multiple Sclerosis.    Also many other serious conditions, like depression, Alzheimer's Dementia, infertility, muscle aches, fatigue, fibromyalgia - just to name a few. ++++++++++++++++++++ Recommend the book "The END of DIETING" by Dr Joel Fuhrman  & the book "The END of DIABETES " by Dr Joel Fuhrman At [employer] - get book & Audio CD's    Being diabetic has a  300% increased risk for heart attack, stroke, cancer, and alzheimer- type vascular dementia. It is very important that you work harder with diet by avoiding all foods that are white. Avoid white rice (brown & wild rice is OK), white potatoes (sweetpotatoes in moderation is OK), White bread or wheat bread or anything made out of white flour like bagels, donuts, rolls, buns, biscuits, cakes, pastries, cookies, pizza crust, and pasta (made from white flour & egg whites) - vegetarian pasta or spinach or wheat pasta is OK. Multigrain breads like Arnold's or Pepperidge [employer], or multigrain sandwich thins or flatbreads.  Diet, exercise and weight loss can reverse and cure diabetes in the early stages.  Diet, exercise and weight loss is  very important in the control and prevention of complications of diabetes which affects every system in your body, ie. Brain - dementia/stroke, eyes - glaucoma/blindness, heart - heart attack/heart failure, kidneys - dialysis, stomach - gastric paralysis, intestines - malabsorption, nerves - severe painful neuritis, circulation - gangrene & loss of a leg(s), and finally cancer and Alzheimers.    I recommend avoid fried & greasy foods,  sweets/candy, white rice (brown or wild rice or Quinoa is OK), white potatoes (sweet potatoes are OK) - anything made from white flour - bagels, doughnuts, rolls, buns, biscuits,white and wheat breads, pizza crust and traditional pasta made of white flour & egg white(vegetarian pasta or spinach or wheat pasta is OK).  Multi-grain bread is OK - like multi-grain flat bread or sandwich thins. Avoid alcohol in excess. Exercise is also important.    Eat all the vegetables you want - avoid meat, especially red meat and dairy - especially cheese.  Cheese is the most concentrated form of trans-fats which is the worst thing to clog up our arteries. Veggie cheese is OK which can be found in the fresh produce section at [employer] or [employer] or [employer]  +++++++++++++++++++++ DASH Eating Plan  DASH stands for "Dietary Approaches to Stop Hypertension."   The DASH eating plan is a healthy eating plan that has been shown to reduce high blood pressure (hypertension). Additional health benefits may include reducing the risk of type 2 diabetes mellitus, heart disease, and stroke. The DASH eating plan may also   help with weight loss. WHAT DO I NEED TO KNOW ABOUT THE DASH EATING PLAN? For the DASH eating plan, you will follow these general guidelines:  Choose foods with a percent daily value for sodium of less than 5% (as listed on the food label).  Use salt-free seasonings or herbs instead of table salt or sea salt.  Check with your health care provider or pharmacist before  using salt substitutes.  Eat lower-sodium products, often labeled as "lower sodium" or "no salt added."  Eat fresh foods.  Eat more vegetables, fruits, and low-fat dairy products.  Choose whole grains. Look for the word "whole" as the first word in the ingredient list.  Choose fish   Limit sweets, desserts, sugars, and sugary drinks.  Choose heart-healthy fats.  Eat veggie cheese   Eat more home-cooked food and less restaurant, buffet, and fast food.  Limit fried foods.  Cook foods using methods other than frying.  Limit canned vegetables. If you do use them, rinse them well to decrease the sodium.  When eating at a restaurant, ask that your food be prepared with less salt, or no salt if possible.                      WHAT FOODS CAN I EAT? Read Dr Joel Fuhrman's books on The End of Dieting & The End of Diabetes  Grains Whole grain or whole wheat bread. Brown rice. Whole grain or whole wheat pasta. Quinoa, bulgur, and whole grain cereals. Low-sodium cereals. Corn or whole wheat flour tortillas. Whole grain cornbread. Whole grain crackers. Low-sodium crackers.  Vegetables Fresh or frozen vegetables (raw, steamed, roasted, or grilled). Low-sodium or reduced-sodium tomato and vegetable juices. Low-sodium or reduced-sodium tomato sauce and paste. Low-sodium or reduced-sodium canned vegetables.   Fruits All fresh, canned (in natural juice), or frozen fruits.  Protein Products  All fish and seafood.  Dried beans, peas, or lentils. Unsalted nuts and seeds. Unsalted canned beans.  Dairy Low-fat dairy products, such as skim or 1% milk, 2% or reduced-fat cheeses, low-fat ricotta or cottage cheese, or plain low-fat yogurt. Low-sodium or reduced-sodium cheeses.  Fats and Oils Tub margarines without trans fats. Light or reduced-fat mayonnaise and salad dressings (reduced sodium). Avocado. Safflower, olive, or canola oils. Natural peanut or almond butter.  Other Unsalted popcorn  and pretzels. The items listed above may not be a complete list of recommended foods or beverages. Contact your dietitian for more options.  +++++++++++++++  WHAT FOODS ARE NOT RECOMMENDED? Grains/ White flour or wheat flour White bread. White pasta. White rice. Refined cornbread. Bagels and croissants. Crackers that contain trans fat.  Vegetables  Creamed or fried vegetables. Vegetables in a . Regular canned vegetables. Regular canned tomato sauce and paste. Regular tomato and vegetable juices.  Fruits Dried fruits. Canned fruit in light or heavy syrup. Fruit juice.  Meat and Other Protein Products Meat in general - RED meat & White meat.  Fatty cuts of meat. Ribs, chicken wings, all processed meats as bacon, sausage, bologna, salami, fatback, hot dogs, bratwurst and packaged luncheon meats.  Dairy Whole or 2% milk, cream, half-and-half, and cream cheese. Whole-fat or sweetened yogurt. Full-fat cheeses or blue cheese. Non-dairy creamers and whipped toppings. Processed cheese, cheese spreads, or cheese curds.  Condiments Onion and garlic salt, seasoned salt, table salt, and sea salt. Canned and packaged gravies. Worcestershire sauce. Tartar sauce. Barbecue sauce. Teriyaki sauce. Soy sauce, including reduced sodium. Steak sauce. Fish sauce. Oyster sauce. Cocktail   sauce. Horseradish. Ketchup and mustard. Meat flavorings and tenderizers. Bouillon cubes. Hot sauce. Tabasco sauce. Marinades. Taco seasonings. Relishes.  Fats and Oils Butter, stick margarine, lard, shortening and bacon fat. Coconut, palm kernel, or palm oils. Regular salad dressings.  Pickles and olives. Salted popcorn and pretzels.  The items listed above may not be a complete list of foods and beverages to avoid.   Spinal Stenosis  Spinal stenosis happens when the open space (spinal canal) between the bones of your spine (vertebrae) gets smaller. It is caused by bone pushing into the open spaces of your backbone  (spine). This puts pressure on your backbone and the nerves in your backbone. Treatment often focuses on managing any pain and symptoms. In some cases, surgery may be needed. Follow these instructions at home: Managing pain, stiffness, and swelling  Do all exercises and stretches as told by your doctor.  Stand and sit up straight (use good posture). If you were given a brace or a corset, wear it as told by your doctor.  Do not do any activities that cause pain. Ask your doctor what activities are safe for you.  Do not lift anything that is heavier than 10 lb (4.5 kg) or heavier than your doctor tells you.  Try to stay at a healthy weight. Talk with your doctor if you need help losing weight.  If directed, put heat on the affected area as often as told by your doctor. Use the heat source that your doctor recommends, such as a moist heat pack or a heating pad. ? Put a towel between your skin and the heat source. ? Leave the heat on for 20-30 minutes. ? Remove the heat if your skin turns bright red. This is especially important if you are not able to feel pain, heat, or cold. You may have a greater risk of getting burned. General instructions  Take over-the-counter and prescription medicines only as told by your doctor.  Do not use any products that contain nicotine or tobacco, such as cigarettes and e-cigarettes. If you need help quitting, ask your doctor.  Eat a healthy diet. This includes plenty of fruits and vegetables, whole grains, and low-fat (lean) protein.  Keep all follow-up visits as told by your doctor. This is important. Contact a doctor if:  Your symptoms do not get better.  Your symptoms get worse.  You have a fever. Get help right away if:  You have new or worse pain in your neck or upper back.  You have very bad pain that medicine does not control.  You are dizzy.  You have vision problems, blurred vision, or double vision.  You have a very bad headache that  is worse when you stand.  You feel sick to your stomach (nauseous).  You throw up (vomit).  You have new or worse numbness or tingling in your back or legs.  You have pain, redness, swelling, or warmth in your arm or leg. Summary  Spinal stenosis happens when the open space (spinal canal) between the bones of your spine gets smaller (narrow).  Contact a doctor if your symptoms get worse.  In some cases, surgery may be needed.

## 2017-10-30 ENCOUNTER — Encounter: Payer: Self-pay | Admitting: Internal Medicine

## 2017-10-30 ENCOUNTER — Other Ambulatory Visit: Payer: Self-pay | Admitting: Internal Medicine

## 2017-10-30 MED ORDER — DIAZEPAM 5 MG PO TABS: ORAL_TABLET | ORAL | 1 refills | Status: DC

## 2017-11-01 LAB — HEMOGLOBIN A1C
HEMOGLOBIN A1C: 5.4 %{Hb} (ref <5.7)
MEAN PLASMA GLUCOSE: 108 (calc)
eAG (mmol/L): 6 (calc)

## 2017-11-01 LAB — CBC WITH DIFFERENTIAL/PLATELET
BASOS ABS: 48 {cells}/uL (ref 0–200)
Basophils Relative: 0.9 %
EOS ABS: 69 {cells}/uL (ref 15–500)
EOS PCT: 1.3 %
HCT: 37.9 % (ref 35.0–45.0)
Hemoglobin: 12.1 g/dL (ref 11.7–15.5)
Lymphs Abs: 906 cells/uL (ref 850–3900)
MCH: 30 pg (ref 27.0–33.0)
MCHC: 31.9 g/dL — AB (ref 32.0–36.0)
MCV: 93.8 fL (ref 80.0–100.0)
MONOS PCT: 9.6 %
MPV: 12.4 fL (ref 7.5–12.5)
Neutro Abs: 3768 cells/uL (ref 1500–7800)
Neutrophils Relative %: 71.1 %
PLATELETS: 239 10*3/uL (ref 140–400)
RBC: 4.04 10*6/uL (ref 3.80–5.10)
RDW: 13.5 % (ref 11.0–15.0)
TOTAL LYMPHOCYTE: 17.1 %
WBC mixed population: 509 cells/uL (ref 200–950)
WBC: 5.3 10*3/uL (ref 3.8–10.8)

## 2017-11-01 LAB — COMPLETE METABOLIC PANEL WITH GFR
AG RATIO: 2.2 (calc) (ref 1.0–2.5)
ALT: 10 U/L (ref 6–29)
AST: 15 U/L (ref 10–35)
Albumin: 4.2 g/dL (ref 3.6–5.1)
Alkaline phosphatase (APISO): 82 U/L (ref 33–130)
BILIRUBIN TOTAL: 0.4 mg/dL (ref 0.2–1.2)
BUN/Creatinine Ratio: 24 (calc) — ABNORMAL HIGH (ref 6–22)
BUN: 24 mg/dL (ref 7–25)
CALCIUM: 9.3 mg/dL (ref 8.6–10.4)
CHLORIDE: 108 mmol/L (ref 98–110)
CO2: 29 mmol/L (ref 20–32)
Creat: 0.98 mg/dL — ABNORMAL HIGH (ref 0.60–0.93)
GFR, EST NON AFRICAN AMERICAN: 56 mL/min/{1.73_m2} — AB (ref >=60)
GFR, Est African American: 65 mL/min/{1.73_m2} (ref >=60)
Globulin: 1.9 g/dL (calc) (ref 1.9–3.7)
Glucose, Bld: 92 mg/dL (ref 65–99)
POTASSIUM: 4.5 mmol/L (ref 3.5–5.3)
Sodium: 145 mmol/L (ref 135–146)
Total Protein: 6.1 g/dL (ref 6.1–8.1)

## 2017-11-01 LAB — LIPID PANEL
CHOLESTEROL: 159 mg/dL (ref <200)
HDL: 49 mg/dL — AB (ref >50)
LDL Cholesterol (Calc): 89 mg/dL (calc)
Non-HDL Cholesterol (Calc): 110 mg/dL (calc) (ref <130)
Total CHOL/HDL Ratio: 3.2 (calc) (ref <5.0)
Triglycerides: 112 mg/dL (ref <150)

## 2017-11-01 LAB — INSULIN, RANDOM: Insulin: 6.4 u[IU]/mL (ref 2.0–19.6)

## 2017-11-01 LAB — TSH: TSH: 3.04 mIU/L (ref 0.40–4.50)

## 2017-11-01 LAB — MAGNESIUM: Magnesium: 2.2 mg/dL (ref 1.5–2.5)

## 2017-11-01 LAB — VITAMIN D 25 HYDROXY (VIT D DEFICIENCY, FRACTURES): Vit D, 25-Hydroxy: 80 ng/mL (ref 30–100)

## 2018-02-02 NOTE — Progress Notes (Signed)
FOLLOW UP  Assessment and Plan:   Chronic atrial fibrillation ([name]) Has adamantly declined anticoagulants, understands risk of stroke and other complication, NSR at this time, rate controlled  Hypertension Controlled/at goal off of medications Monitor blood pressure at home; patient to call if consistently greater than 130/80 Continue DASH diet.   Reminder to go to the ER if any CP, SOB, nausea, dizziness, severe HA, changes vision/speech, left arm numbness and tingling and jaw pain.  Cholesterol Currently at goal;  Continue low cholesterol diet and exercise.  Check lipid panel.   Other abnormal glucose  Recent A1Cs at goal Discussed diet/exercise, weight management  Defer A1C; check CMP  Obesity with co morbidities Long discussion about weight loss, diet, and exercise Recommended diet heavy in fruits and veggies and low in animal meats, cheeses, and dairy products, appropriate calorie intake Discussed ideal weight for height Will follow up in 3 months  Vitamin D Def At goal at last visit; continue supplementation to maintain goal of 70-100 Defer Vit D level  Exertional fatigue/ dry cough/ recent anemia Recent hx of anemia of unclear etiology, p. A. Fib not on anticoag,  CXR to rule out small pneumonia or effusion, eval heart size Strongly suspect she is anemic, given hemoccult card to take home and will have her complete pending CBC results Follow up with chest CTA with any vascular congestion or D dimer elevation She had neg heart cath in 2009 after false positive myoview, but may have follow up with cardiology should workup not clarify etiology of symptoms Go to the ER if any chest pain, shortness of breath, nausea, dizziness, severe HA, changes vision/speech, syncope, bleeding  Anemia, unspecified type -     CBC with Differential/Platelet  Exertional dyspnea -     D-dimer, quantitative (not at [facility]) -     EKG 12-Lead -     DG Chest 2 View; Future  Fatigue,  unspecified type -     D-dimer, quantitative (not at [facility])  Dry cough -     D-dimer, quantitative (not at [facility]) -     DG Chest 2 View; Future  Chest pressure -     D-dimer, quantitative (not at [facility]) -     Troponin I -     EKG 12-Lead  Continue diet and meds as discussed. Further disposition pending results of labs. Discussed med's effects and SE's.   Over 30 minutes of exam, counseling, chart review, and critical decision making was performed.   Future Appointments  Date Time Provider [place]  05/06/2018 10:00 AM Unk Pinto, MD [facility] None  08/04/2018 11:15 AM Vicie Mutters, PA-C [facility] None    ----------------------------------------------------------------------------------------------------------------------  HPI 77 y.o. female  presents for 3 month follow up on hypertension, cholesterol, glucose management, obesity and vitamin D deficiency. In 2009, she had a false (+) Myoview confirmed by a Negative Heart Cath. She has history of Afib, CHA2DS2VASC 3, she adamantly declined Warfarin /NOAC's, understands risk of stroke. She is on valium for vertigo and anxiety.   She presents today with non-productive, intermittent but persistent cough, x 3 weeks, and additionally reports exertional dyspnea and fatigue. She denies congestion, has been hoarse yesterday and today but this is new. She denies fever/chills, palpitations, dizziness. She was recently seen in the ED for symptomatic anemia, hgb 8.7, declined transfusion, was on iron supplement for a short while per patient but not currently taking. Her CBC was back to normal as of 10/29/2017 with normal/high iron. Cause of anemia was  never determined. She does report brief intermittent chest pain, most commonly at night when trying to sleep; she reports duration of a 30-60 sec or so, characterizes as pressure, 5-8/10. Cannot identify alleviating or aggravating factors, but notably denies being associated with exertion. She  reports ongoing dizziness. She appears pale today.  Last colonoscopy was in 2013. Occult blood was ordered in ED, result cannot be located, presume this was normal. She denies unusual bleeding, hematochezia, melena. She is mildly tachypneic moving from chair to exam table today.   She is on prednisone for spinal stenosis with bilateral radicular symptoms. Currently taking PRN and reports this quickly resolves symptoms.   BMI is Body mass index is 31.41 kg/m., she has not been working on diet and exercise. Wt Readings from Last 3 Encounters:  02/03/18 183 lb (83 kg)  10/29/17 179 lb 12.8 oz (81.6 kg)  09/08/17 185 lb (83.9 kg)   Her blood pressure has been controlled at home, today their BP is BP: 134/80  She does not workout. She denies chest pain, shortness of breath, dizziness.   She is not on cholesterol medication. Her cholesterol is at go1al. The cholesterol last visit was:   Lab Results  Component Value Date   CHOL 159 10/29/2017   HDL 49 (L) 10/29/2017   LDLCALC 89 10/29/2017   TRIG 112 10/29/2017   CHOLHDL 3.2 10/29/2017    She has not been working on diet and exercise for glucose management, and denies increased appetite, nausea, paresthesia of the feet, polydipsia, polyuria and visual disturbances. Last A1C in the office was:  Lab Results  Component Value Date   HGBA1C 5.4 10/29/2017   Patient is on Vitamin D supplement.   Lab Results  Component Value Date   VD25OH 80 10/29/2017        Current Medications:  Current Outpatient Medications on File Prior to Visit  Medication Sig  . aspirin EC 81 MG tablet Take 162 mg by mouth daily.  . Cholecalciferol (VITAMIN D PO) Take 2,000-4,000 mg by mouth daily.   . diazepam (VALIUM) 5 MG tablet Take 1/2 to 1 tab tid prn for anxiety or VERTIGO  . Multiple Vitamins-Minerals (CENTRUM SILVER 50+WOMEN PO) Take by mouth daily.  . predniSONE (DELTASONE) 10 MG tablet TAKE 2 TABLETS BY MOUTH DAILY WITH BREAKFAST.  Marland Kitchen TURMERIC PO Take  by mouth.   No current facility-administered medications on file prior to visit.      Allergies: No Known Allergies   Medical History:  Past Medical History:  Diagnosis Date  . Arthritis   . Asthma   . Hyperlipidemia   . Hypertension   . Migraines   . Prediabetes   . Vertigo   . Vitamin D deficiency    Family history- Reviewed and unchanged Social history- Reviewed and unchanged   Review of Systems:  Review of Systems  Constitutional: Positive for malaise/fatigue. Negative for chills, fever and weight loss.  HENT: Negative for congestion, hearing loss, nosebleeds, sinus pain, sore throat and tinnitus.   Eyes: Negative for blurred vision and double vision.  Respiratory: Positive for cough. Negative for hemoptysis, sputum production, shortness of breath, wheezing and stridor.   Cardiovascular: Positive for chest pain (intermittent, pressure, not with exertion). Negative for palpitations, orthopnea, claudication and leg swelling.  Gastrointestinal: Negative for abdominal pain, blood in stool, constipation, diarrhea, heartburn, melena, nausea and vomiting.  Genitourinary: Negative.   Musculoskeletal: Negative for falls, joint pain and myalgias.  Skin: Negative for rash.  Neurological: Positive  for dizziness (mild, intermittent). Negative for tingling, sensory change, weakness and headaches.  Endo/Heme/Allergies: Negative for polydipsia.  Psychiatric/Behavioral: Negative.   All other systems reviewed and are negative.   Physical Exam: BP 134/80   Pulse 84   Temp 97.6 F (36.4 C)   Ht 5\' 4"  (1.626 m)   Wt 183 lb (83 kg)   SpO2 96%   BMI 31.41 kg/m  Wt Readings from Last 3 Encounters:  02/03/18 183 lb (83 kg)  10/29/17 179 lb 12.8 oz (81.6 kg)  09/08/17 185 lb (83.9 kg)   General Appearance: Well nourished, appears pale, in no acute distress. Eyes: PERRLA, EOMs, conjunctiva no swelling or erythema Sinuses: No Frontal/maxillary tenderness ENT/Mouth: Ext aud canals  clear, TMs without erythema, bulging. No erythema, swelling, or exudate on post pharynx.  Tonsils not swollen or erythematous. Hearing normal. Mucus membranes are pale.  Neck: Supple, thyroid normal.  Respiratory: Respiratory effort increased, BS equal bilaterally without rales, rhonchi, wheezing or stridor.  Cardio: Briefly irregular prior to regular S1/2 with mild 2/6 systolic murmur without radiation. Brisk peripheral pulses without notable edema. No JVD.  Abdomen: Soft, + BS.  Non tender, no guarding, rebound, hernias, masses. Lymphatics: Non tender without lymphadenopathy.  Musculoskeletal: Full ROM, 5/5 strength, slow steady gait Skin: Warm, dry without rashes, lesions, ecchymosis. Appears very pale.  Neuro: Cranial nerves intact. No cerebellar symptoms.  Psych: Awake and oriented X 3, normal affect, Insight and Judgment appropriate.    Izora Ribas, NP 11:39 AM [facility]

## 2018-02-03 ENCOUNTER — Ambulatory Visit (INDEPENDENT_AMBULATORY_CARE_PROVIDER_SITE_OTHER): Payer: PPO | Admitting: Adult Health

## 2018-02-03 ENCOUNTER — Other Ambulatory Visit: Payer: Self-pay | Admitting: Internal Medicine

## 2018-02-03 ENCOUNTER — Other Ambulatory Visit: Payer: Self-pay | Admitting: Adult Health

## 2018-02-03 ENCOUNTER — Encounter: Payer: Self-pay | Admitting: Adult Health

## 2018-02-03 ENCOUNTER — Ambulatory Visit (HOSPITAL_COMMUNITY)
Admission: RE | Admit: 2018-02-03 | Discharge: 2018-02-03 | Disposition: A | Discharge status: Home / Self Care | Payer: PPO | Source: Ambulatory Visit | Attending: Adult Health | Admitting: Adult Health

## 2018-02-03 VITALS — BP 134/80 | HR 84 | Temp 97.6°F | Ht 64.0 in | Wt 183.0 lb

## 2018-02-03 DIAGNOSIS — R05 Cough: Secondary | ICD-10-CM

## 2018-02-03 DIAGNOSIS — K449 Diaphragmatic hernia without obstruction or gangrene: Secondary | ICD-10-CM | POA: Insufficient documentation

## 2018-02-03 DIAGNOSIS — M47894 Other spondylosis, thoracic region: Secondary | ICD-10-CM | POA: Insufficient documentation

## 2018-02-03 DIAGNOSIS — R7309 Other abnormal glucose: Secondary | ICD-10-CM

## 2018-02-03 DIAGNOSIS — D508 Other iron deficiency anemias: Secondary | ICD-10-CM | POA: Diagnosis not present

## 2018-02-03 DIAGNOSIS — R0602 Shortness of breath: Secondary | ICD-10-CM

## 2018-02-03 DIAGNOSIS — I10 Essential (primary) hypertension: Secondary | ICD-10-CM

## 2018-02-03 DIAGNOSIS — I2699 Other pulmonary embolism without acute cor pulmonale: Secondary | ICD-10-CM

## 2018-02-03 DIAGNOSIS — E669 Obesity, unspecified: Secondary | ICD-10-CM | POA: Diagnosis not present

## 2018-02-03 DIAGNOSIS — Z79899 Other long term (current) drug therapy: Secondary | ICD-10-CM

## 2018-02-03 DIAGNOSIS — R0609 Other forms of dyspnea: Secondary | ICD-10-CM

## 2018-02-03 DIAGNOSIS — R0789 Other chest pain: Secondary | ICD-10-CM

## 2018-02-03 DIAGNOSIS — R5383 Other fatigue: Secondary | ICD-10-CM | POA: Diagnosis not present

## 2018-02-03 DIAGNOSIS — D649 Anemia, unspecified: Secondary | ICD-10-CM | POA: Diagnosis not present

## 2018-02-03 DIAGNOSIS — R058 Other specified cough: Secondary | ICD-10-CM

## 2018-02-03 DIAGNOSIS — E559 Vitamin D deficiency, unspecified: Secondary | ICD-10-CM | POA: Diagnosis not present

## 2018-02-03 DIAGNOSIS — R0989 Other specified symptoms and signs involving the circulatory and respiratory systems: Secondary | ICD-10-CM | POA: Diagnosis not present

## 2018-02-03 DIAGNOSIS — E782 Mixed hyperlipidemia: Secondary | ICD-10-CM

## 2018-02-03 DIAGNOSIS — I480 Paroxysmal atrial fibrillation: Secondary | ICD-10-CM | POA: Diagnosis not present

## 2018-02-03 LAB — POCT I-STAT CREATININE: CREATININE: 0.9 mg/dL (ref 0.44–1.00)

## 2018-02-03 MED ORDER — SODIUM CHLORIDE (PF) 0.9 % IJ SOLN
INTRAMUSCULAR | Status: AC
  Filled 2018-02-03: qty 50

## 2018-02-03 MED ORDER — IOHEXOL 300 MG/ML  SOLN
75.0000 mL | Freq: Once | INTRAMUSCULAR | Status: AC | PRN
  Administered 2018-02-03: 75 mL via INTRAVENOUS

## 2018-02-03 NOTE — Progress Notes (Signed)
Received verbal report regarding CT chest from Dr. Rosana Hoes, no hilar mass, consolidation, lesions, but small segmental LLL PE was confirmed. Patient does have hx of p. A. Fib not on anticoagulants due to patient firmly declining despite discussion of risk of PE, stroke. She also notably has recent history of profound anemia, Hgb 8.8 on 07/01/2017, recommended transfusion in ED but declined and was treated by OTC iron supplements with CBC normalized as of 10/29/2017 and iron was d/c'd. Etiology of anemia is unclear, though appears hemoccult in ED was negative. Her last colonoscopy was in 2013 with Dr. Olevia Perches with 10 year follow up recommendation. On exam today, she was notably very pale with pale mucus membranes. I discussed my concerns regarding initiating anticoagulants in light of possible concurrent anemia; she has notably had exertional dyspnea symptoms for 3 weeks without acceleration of symptoms, VS were stable today. Discussed sending to ED vs observation with strong ER precautions overnight. Patient was called, risks of worsening clot were discussed, she expresses her understanding but wishes to stay at home overnight pending CBC. She understands she is to present to ED for any worsening symptoms. We will follow up with her in the AM to discuss lab results and initiate anticoagulation therapy after anemia is ruled out.

## 2018-02-03 NOTE — Progress Notes (Signed)
CXR ordered for exertional dyspnea, dry cough x 3 weeks demonstrated some abnormalities - 1. Enlarged cardiopericardial silhouette. Question of pericardial effusion and 2. Fullness in the RIGHT hilar/suprahilar regions, raising the question of adenopathy. Recommend further evaluation with chest CT. This was communicated to the patient and STAT chest CT with contrast ordered.

## 2018-02-03 NOTE — Progress Notes (Signed)
Received verbal report regarding CT chest from Dr. Rosana Hoes, no hilar mass, consolidation, lesions, but small segmental LLL PE was confirmed. Patient does have hx of p. A. Fib, cha2ds2vasc of 3, not on anticoagulants due to patient firmly declining despite discussion of risk of PE, stroke. She also notably has recent history of profound anemia, Hgb 8.8 on 07/01/2017, recommended transfusion in ED but declined and was treated by OTC iron supplements with CBC normalized as of 10/29/2017 and iron was d/c'd. Etiology of anemia is unclear, though appears hemoccult in ED was negative. Her last colonoscopy was in 2013 with Dr. Olevia Perches with 10 year follow up recommendation. On exam today, she was notably very pale with pale mucus membranes. I discussed my concerns regarding initiating anticoagulants in light of possible concurrent anemia with Dr. Melford Aase; she has notably had exertional dyspnea symptoms for 3 weeks without acceleration of symptoms, VS were stable today. Discussed sending to ED vs observation with strong ER precautions overnight. Patient was called, risks of worsening clot were discussed, she expresses her understanding but wishes to stay at home overnight pending CBC. She understands she is to present to ED for any worsening symptoms. We will follow up with her in the AM to discuss lab results and initiate anticoagulation therapy after anemia is ruled out.

## 2018-02-03 NOTE — Progress Notes (Signed)
   Thurs Nite 11:30 pm    After call from [place] lab re: Low Hgb 5.6 gm% - patient was contacted & advised of severe danger of low Hgb and need for blood transfusion. Recommended that she call "[phone]" and not try drive to ER. She indicated that she would wait until morning despite my pleas to go immediately to the ER.   - Unk Pinto

## 2018-02-03 NOTE — Patient Instructions (Signed)
Please go to the ER is shortness of breath or chest pains start getting worse tonight     Shortness of Breath, Adult Shortness of breath is when a person has trouble breathing enough air, or when a person feels like she or he is having trouble breathing in enough air. Shortness of breath could be a sign of medical problem. Follow these instructions at home: Pay attention to any changes in your symptoms. Take these actions to help with your condition:  Do not smoke. Smoking is a common cause of shortness of breath. If you smoke and you need help quitting, ask your health care provider.  Avoid things that can irritate your airways, such as: ? Mold. ? Dust. ? Air pollution. ? Chemical fumes. ? Things that can cause allergy symptoms (allergens), if you have allergies.  Keep your living space clean and free of mold and dust.  Rest as needed. Slowly return to your usual activities.  Take over-the-counter and prescription medicines, including oxygen and inhaled medicines, only as told by your health care provider.  Keep all follow-up visits as told by your health care provider. This is important.  Contact a health care provider if:  Your condition does not improve as soon as expected.  You have a hard time doing your normal activities, even after you rest.  You have new symptoms. Get help right away if:  Your shortness of breath gets worse.  You have shortness of breath when you are resting.  You feel light-headed or you faint.  You have a cough that is not controlled with medicines.  You cough up blood.  You have pain with breathing.  You have pain in your chest, arms, shoulders, or abdomen.  You have a fever.  You cannot walk up stairs or exercise the way that you normally do. This information is not intended to replace advice given to you by your health care provider. Make sure you discuss any questions you have with your health care provider. Document Released:  11/25/2000 Document Revised: 09/21/2015 Document Reviewed: 08/08/2015 Elsevier Interactive Patient Education  2018 [employer].     Anemia Anemia is a condition in which you do not have enough red blood cells or hemoglobin. Hemoglobin is a substance in red blood cells that carries oxygen. When you do not have enough red blood cells or hemoglobin (are anemic), your body cannot get enough oxygen and your organs may not work properly. As a result, you may feel very tired or have other problems. What are the causes? Common causes of anemia include:  Excessive bleeding. Anemia can be caused by excessive bleeding inside or outside the body, including bleeding from the intestine or from periods in women.  Poor nutrition.  Long-lasting (chronic) kidney, thyroid, and liver disease.  Bone marrow disorders.  Cancer and treatments for cancer.  HIV (human immunodeficiency virus) and AIDS (acquired immunodeficiency syndrome).  Treatments for HIV and AIDS.  Spleen problems.  Blood disorders.  Infections, medicines, and autoimmune disorders that destroy red blood cells.  What are the signs or symptoms? Symptoms of this condition include:  Minor weakness.  Dizziness.  Headache.  Feeling heartbeats that are irregular or faster than normal (palpitations).  Shortness of breath, especially with exercise.  Paleness.  Cold sensitivity.  Indigestion.  Nausea.  Difficulty sleeping.  Difficulty concentrating.  Symptoms may occur suddenly or develop slowly. If your anemia is mild, you may not have symptoms. How is this diagnosed? This condition is diagnosed based on:  Blood tests.  Your medical history.  A physical exam.  Bone marrow biopsy.  Your health care provider may also check your stool (feces) for blood and may do additional testing to look for the cause of your bleeding. You may also have other tests, including:  Imaging tests, such as a CT scan or  MRI.  Endoscopy.  Colonoscopy.  How is this treated? Treatment for this condition depends on the cause. If you continue to lose a lot of blood, you may need to be treated at a hospital. Treatment may include:  Taking supplements of iron, vitamin Q82, or folic acid.  Taking a hormone medicine (erythropoietin) that can help to stimulate red blood cell growth.  Having a blood transfusion. This may be needed if you lose a lot of blood.  Making changes to your diet.  Having surgery to remove your spleen.  Follow these instructions at home:  Take over-the-counter and prescription medicines only as told by your health care provider.  Take supplements only as told by your health care provider.  Follow any diet instructions that you were given.  Keep all follow-up visits as told by your health care provider. This is important. Contact a health care provider if:  You develop new bleeding anywhere in the body. Get help right away if:  You are very weak.  You are short of breath.  You have pain in your abdomen or chest.  You are dizzy or feel faint.  You have trouble concentrating.  You have bloody or black, tarry stools.  You vomit repeatedly or you vomit up blood. Summary  Anemia is a condition in which you do not have enough red blood cells or enough of a substance in your red blood cells that carries oxygen (hemoglobin).  Symptoms may occur suddenly or develop slowly.  If your anemia is mild, you may not have symptoms.  This condition is diagnosed with blood tests as well as a medical history and physical exam. Other tests may be needed.  Treatment for this condition depends on the cause of the anemia. This information is not intended to replace advice given to you by your health care provider. Make sure you discuss any questions you have with your health care provider. Document Released: 04/09/2004 Document Revised: 04/03/2016 Document Reviewed: 04/03/2016 Elsevier  Interactive Patient Education  [employer].

## 2018-02-04 ENCOUNTER — Inpatient Hospital Stay (HOSPITAL_COMMUNITY): Payer: PPO

## 2018-02-04 ENCOUNTER — Other Ambulatory Visit: Payer: Self-pay

## 2018-02-04 ENCOUNTER — Inpatient Hospital Stay (HOSPITAL_COMMUNITY)
Admission: EM | Admit: 2018-02-04 | Discharge: 2018-02-07 | DRG: 811 | Disposition: A | Discharge status: Home / Self Care | Payer: PPO | Attending: Internal Medicine | Admitting: Internal Medicine

## 2018-02-04 ENCOUNTER — Encounter (HOSPITAL_COMMUNITY): Payer: Self-pay | Admitting: Emergency Medicine

## 2018-02-04 ENCOUNTER — Inpatient Hospital Stay (HOSPITAL_COMMUNITY)
Admit: 2018-02-04 | Discharge: 2018-02-04 | Disposition: A | Discharge status: Home / Self Care | Payer: PPO | Attending: Family Medicine | Admitting: Family Medicine

## 2018-02-04 DIAGNOSIS — G8929 Other chronic pain: Secondary | ICD-10-CM | POA: Diagnosis present

## 2018-02-04 DIAGNOSIS — K2210 Ulcer of esophagus without bleeding: Secondary | ICD-10-CM | POA: Diagnosis not present

## 2018-02-04 DIAGNOSIS — K640 First degree hemorrhoids: Secondary | ICD-10-CM | POA: Diagnosis present

## 2018-02-04 DIAGNOSIS — K449 Diaphragmatic hernia without obstruction or gangrene: Secondary | ICD-10-CM | POA: Diagnosis present

## 2018-02-04 DIAGNOSIS — I2699 Other pulmonary embolism without acute cor pulmonale: Secondary | ICD-10-CM | POA: Diagnosis present

## 2018-02-04 DIAGNOSIS — E559 Vitamin D deficiency, unspecified: Secondary | ICD-10-CM | POA: Diagnosis not present

## 2018-02-04 DIAGNOSIS — D509 Iron deficiency anemia, unspecified: Secondary | ICD-10-CM | POA: Diagnosis not present

## 2018-02-04 DIAGNOSIS — K2100 Gastro-esophageal reflux disease with esophagitis, without bleeding: Secondary | ICD-10-CM

## 2018-02-04 DIAGNOSIS — J45909 Unspecified asthma, uncomplicated: Secondary | ICD-10-CM | POA: Diagnosis not present

## 2018-02-04 DIAGNOSIS — I2693 Single subsegmental pulmonary embolism without acute cor pulmonale: Secondary | ICD-10-CM | POA: Diagnosis not present

## 2018-02-04 DIAGNOSIS — K222 Esophageal obstruction: Secondary | ICD-10-CM | POA: Diagnosis not present

## 2018-02-04 DIAGNOSIS — M549 Dorsalgia, unspecified: Secondary | ICD-10-CM | POA: Diagnosis present

## 2018-02-04 DIAGNOSIS — I340 Nonrheumatic mitral (valve) insufficiency: Secondary | ICD-10-CM | POA: Diagnosis not present

## 2018-02-04 DIAGNOSIS — I480 Paroxysmal atrial fibrillation: Secondary | ICD-10-CM

## 2018-02-04 DIAGNOSIS — K319 Disease of stomach and duodenum, unspecified: Secondary | ICD-10-CM | POA: Diagnosis present

## 2018-02-04 DIAGNOSIS — R42 Dizziness and giddiness: Secondary | ICD-10-CM | POA: Diagnosis present

## 2018-02-04 DIAGNOSIS — I4891 Unspecified atrial fibrillation: Secondary | ICD-10-CM | POA: Diagnosis not present

## 2018-02-04 DIAGNOSIS — K259 Gastric ulcer, unspecified as acute or chronic, without hemorrhage or perforation: Secondary | ICD-10-CM | POA: Diagnosis not present

## 2018-02-04 DIAGNOSIS — D508 Other iron deficiency anemias: Principal | ICD-10-CM | POA: Diagnosis present

## 2018-02-04 DIAGNOSIS — K5730 Diverticulosis of large intestine without perforation or abscess without bleeding: Secondary | ICD-10-CM | POA: Diagnosis present

## 2018-02-04 DIAGNOSIS — M1990 Unspecified osteoarthritis, unspecified site: Secondary | ICD-10-CM | POA: Diagnosis present

## 2018-02-04 DIAGNOSIS — Z87891 Personal history of nicotine dependence: Secondary | ICD-10-CM

## 2018-02-04 DIAGNOSIS — T39395A Adverse effect of other nonsteroidal anti-inflammatory drugs [NSAID], initial encounter: Secondary | ICD-10-CM | POA: Diagnosis not present

## 2018-02-04 DIAGNOSIS — Z79899 Other long term (current) drug therapy: Secondary | ICD-10-CM | POA: Diagnosis not present

## 2018-02-04 DIAGNOSIS — R7303 Prediabetes: Secondary | ICD-10-CM | POA: Diagnosis present

## 2018-02-04 DIAGNOSIS — I3139 Other pericardial effusion (noninflammatory): Secondary | ICD-10-CM

## 2018-02-04 DIAGNOSIS — D500 Iron deficiency anemia secondary to blood loss (chronic): Secondary | ICD-10-CM

## 2018-02-04 DIAGNOSIS — I313 Pericardial effusion (noninflammatory): Secondary | ICD-10-CM

## 2018-02-04 DIAGNOSIS — Z7982 Long term (current) use of aspirin: Secondary | ICD-10-CM

## 2018-02-04 DIAGNOSIS — Z86711 Personal history of pulmonary embolism: Secondary | ICD-10-CM

## 2018-02-04 DIAGNOSIS — D649 Anemia, unspecified: Secondary | ICD-10-CM

## 2018-02-04 DIAGNOSIS — I361 Nonrheumatic tricuspid (valve) insufficiency: Secondary | ICD-10-CM | POA: Diagnosis not present

## 2018-02-04 DIAGNOSIS — I10 Essential (primary) hypertension: Secondary | ICD-10-CM | POA: Diagnosis not present

## 2018-02-04 DIAGNOSIS — M4800 Spinal stenosis, site unspecified: Secondary | ICD-10-CM | POA: Diagnosis present

## 2018-02-04 DIAGNOSIS — Z7952 Long term (current) use of systemic steroids: Secondary | ICD-10-CM

## 2018-02-04 DIAGNOSIS — F419 Anxiety disorder, unspecified: Secondary | ICD-10-CM | POA: Diagnosis present

## 2018-02-04 DIAGNOSIS — D125 Benign neoplasm of sigmoid colon: Secondary | ICD-10-CM | POA: Diagnosis present

## 2018-02-04 HISTORY — DX: Personal history of pulmonary embolism: Z86.711

## 2018-02-04 HISTORY — DX: Other pericardial effusion (noninflammatory): I31.39

## 2018-02-04 HISTORY — DX: Pericardial effusion (noninflammatory): I31.3

## 2018-02-04 HISTORY — DX: Anemia, unspecified: D64.9

## 2018-02-04 LAB — CBC WITH DIFFERENTIAL/PLATELET
ABS IMMATURE GRANULOCYTES: 0.03 10*3/uL (ref 0.00–0.07)
Basophils Absolute: 48 cells/uL (ref 0–200)
Basophils Absolute: 0 10*3/uL (ref 0.0–0.1)
Basophils Relative: 1 %
Basophils Relative: 1 %
EOS PCT: 0.6 %
Eosinophils Absolute: 29 cells/uL (ref 15–500)
Eosinophils Absolute: 0.1 10*3/uL (ref 0.0–0.5)
Eosinophils Relative: 1 %
HCT: 20.2 % — ABNORMAL LOW (ref 35.0–45.0)
HEMATOCRIT: 21.4 % — AB (ref 36.0–46.0)
HEMOGLOBIN: 5.6 g/dL — AB (ref 12.0–15.0)
Hemoglobin: 5.6 g/dL — CL (ref 11.7–15.5)
Immature Granulocytes: 1 %
LYMPHS PCT: 22 %
Lymphs Abs: 835 cells/uL — ABNORMAL LOW (ref 850–3900)
Lymphs Abs: 1.1 10*3/uL (ref 0.7–4.0)
MCH: 19.9 pg — ABNORMAL LOW (ref 27.0–33.0)
MCH: 20 pg — ABNORMAL LOW (ref 26.0–34.0)
MCHC: 27.7 g/dL — ABNORMAL LOW (ref 32.0–36.0)
MCHC: 26.2 g/dL — ABNORMAL LOW (ref 30.0–36.0)
MCV: 71.9 fL — ABNORMAL LOW (ref 80.0–100.0)
MCV: 76.4 fL — AB (ref 80.0–100.0)
MONO ABS: 0.8 10*3/uL (ref 0.1–1.0)
MONOS PCT: 12.3 %
MONOS PCT: 16 %
MPV: 12.2 fL (ref 7.5–12.5)
NEUTROS ABS: 2.9 10*3/uL (ref 1.7–7.7)
NEUTROS PCT: 68.7 %
Neutro Abs: 3298 cells/uL (ref 1500–7800)
Neutrophils Relative %: 59 %
PLATELETS: 286 10*3/uL (ref 140–400)
PLATELETS: 263 10*3/uL (ref 150–400)
RBC: 2.81 10*6/uL — AB (ref 3.80–5.10)
RBC: 2.8 MIL/uL — ABNORMAL LOW (ref 3.87–5.11)
RDW: 19.2 % — AB (ref 11.0–15.0)
RDW: 20 % — ABNORMAL HIGH (ref 11.5–15.5)
TOTAL LYMPHOCYTE: 17.4 %
WBC mixed population: 590 cells/uL (ref 200–950)
WBC: 4.8 10*3/uL (ref 3.8–10.8)
WBC: 4.9 10*3/uL (ref 4.0–10.5)
nRBC: 0 % (ref 0.0–0.2)

## 2018-02-04 LAB — BASIC METABOLIC PANEL
ANION GAP: 6 (ref 5–15)
BUN: 24 mg/dL — ABNORMAL HIGH (ref 8–23)
CHLORIDE: 112 mmol/L — AB (ref 98–111)
CO2: 26 mmol/L (ref 22–32)
Calcium: 8.9 mg/dL (ref 8.9–10.3)
Creatinine, Ser: 0.88 mg/dL (ref 0.44–1.00)
GFR calc Af Amer: >60 mL/min (ref >60)
GFR calc non Af Amer: >60 mL/min (ref >60)
GLUCOSE: 110 mg/dL — AB (ref 70–99)
POTASSIUM: 4 mmol/L (ref 3.5–5.1)
Sodium: 144 mmol/L (ref 135–145)

## 2018-02-04 LAB — COMPLETE METABOLIC PANEL WITH GFR
AG Ratio: 2.3 (calc) (ref 1.0–2.5)
ALT: 20 U/L (ref 6–29)
AST: 20 U/L (ref 10–35)
Albumin: 4.1 g/dL (ref 3.6–5.1)
Alkaline phosphatase (APISO): 90 U/L (ref 33–130)
BUN: 20 mg/dL (ref 7–25)
CO2: 26 mmol/L (ref 20–32)
CREATININE: 0.91 mg/dL (ref 0.60–0.93)
Calcium: 8.9 mg/dL (ref 8.6–10.4)
Chloride: 110 mmol/L (ref 98–110)
GFR, Est African American: 72 mL/min/{1.73_m2} (ref >=60)
GFR, Est Non African American: 62 mL/min/{1.73_m2} (ref >=60)
GLUCOSE: 102 mg/dL — AB (ref 65–99)
Globulin: 1.8 g/dL (calc) — ABNORMAL LOW (ref 1.9–3.7)
Potassium: 4.4 mmol/L (ref 3.5–5.3)
SODIUM: 144 mmol/L (ref 135–146)
TOTAL PROTEIN: 5.9 g/dL — AB (ref 6.1–8.1)
Total Bilirubin: 0.4 mg/dL (ref 0.2–1.2)

## 2018-02-04 LAB — RETICULOCYTES
IMMATURE RETIC FRACT: 8.7 % (ref 2.3–15.9)
RBC.: 2.8 MIL/uL — ABNORMAL LOW (ref 3.87–5.11)
Retic Count, Absolute: 59.1 10*3/uL (ref 19.0–186.0)
Retic Ct Pct: 2.1 % (ref 0.4–3.1)

## 2018-02-04 LAB — FERRITIN: Ferritin: 2 ng/mL — ABNORMAL LOW (ref 11–307)

## 2018-02-04 LAB — PROTIME-INR
INR: 1.04
Prothrombin Time: 13.5 seconds (ref 11.4–15.2)

## 2018-02-04 LAB — CBC
HCT: 26.2 % — ABNORMAL LOW (ref 36.0–46.0)
HEMOGLOBIN: 7.6 g/dL — AB (ref 12.0–15.0)
MCH: 22.6 pg — AB (ref 26.0–34.0)
MCHC: 29 g/dL — ABNORMAL LOW (ref 30.0–36.0)
MCV: 78 fL — AB (ref 80.0–100.0)
NRBC: 0 % (ref 0.0–0.2)
Platelets: 192 10*3/uL (ref 150–400)
RBC: 3.36 MIL/uL — AB (ref 3.87–5.11)
RDW: 19.3 % — ABNORMAL HIGH (ref 11.5–15.5)
WBC: 4.5 10*3/uL (ref 4.0–10.5)

## 2018-02-04 LAB — OCCULT BLOOD X 1 CARD TO LAB, STOOL: FECAL OCCULT BLD: NEGATIVE

## 2018-02-04 LAB — LIPID PANEL
CHOL/HDL RATIO: 2.4 (calc) (ref <5.0)
Cholesterol: 93 mg/dL (ref <200)
HDL: 39 mg/dL — AB (ref >50)
LDL Cholesterol (Calc): 35 mg/dL (calc)
NON-HDL CHOLESTEROL (CALC): 54 mg/dL (ref <130)
Triglycerides: 102 mg/dL (ref <150)

## 2018-02-04 LAB — IRON AND TIBC
IRON: 17 ug/dL — AB (ref 28–170)
Saturation Ratios: 4 % — ABNORMAL LOW (ref 10.4–31.8)
TIBC: 455 ug/dL — AB (ref 250–450)
UIBC: 438 ug/dL

## 2018-02-04 LAB — APTT: APTT: 103 s — AB (ref 24–36)

## 2018-02-04 LAB — TROPONIN I: TROPONIN I: 0.01 ng/mL (ref <=0.0)

## 2018-02-04 LAB — PREPARE RBC (CROSSMATCH)

## 2018-02-04 LAB — D-DIMER, QUANTITATIVE (NOT AT ARMC): D DIMER QUANT: 1.18 ug{FEU}/mL — AB (ref <0.50)

## 2018-02-04 LAB — VITAMIN B12: Vitamin B-12: 1055 pg/mL — ABNORMAL HIGH (ref 180–914)

## 2018-02-04 LAB — ECHOCARDIOGRAM COMPLETE
Height: 65 in
Weight: 2888.91 oz

## 2018-02-04 LAB — HEPARIN LEVEL (UNFRACTIONATED): Heparin Unfractionated: 0.75 IU/mL — ABNORMAL HIGH (ref 0.30–0.70)

## 2018-02-04 LAB — CBC MORPHOLOGY

## 2018-02-04 LAB — TSH: TSH: 3.14 mIU/L (ref 0.40–4.50)

## 2018-02-04 LAB — FOLATE: FOLATE: 12.6 ng/mL (ref >5.9)

## 2018-02-04 MED ORDER — SODIUM CHLORIDE 0.9% IV SOLUTION
Freq: Once | INTRAVENOUS | Status: AC
  Administered 2018-02-04: 04:00:00 via INTRAVENOUS

## 2018-02-04 MED ORDER — HYDRALAZINE HCL 20 MG/ML IJ SOLN
10.0000 mg | Freq: Four times a day (QID) | INTRAMUSCULAR | Status: DC | PRN
  Administered 2018-02-04 – 2018-02-05 (×3): 10 mg via INTRAVENOUS
  Filled 2018-02-04 (×3): qty 1

## 2018-02-04 MED ORDER — HEPARIN (PORCINE) 25000 UT/250ML-% IV SOLN
1300.0000 [IU]/h | INTRAVENOUS | Status: DC
  Administered 2018-02-04: 1300 [IU]/h via INTRAVENOUS
  Filled 2018-02-04: qty 250

## 2018-02-04 MED ORDER — HEPARIN BOLUS VIA INFUSION
2000.0000 [IU] | Freq: Once | INTRAVENOUS | Status: AC
  Administered 2018-02-04: 2000 [IU] via INTRAVENOUS
  Filled 2018-02-04: qty 2000

## 2018-02-04 MED ORDER — SODIUM CHLORIDE 0.9 % IV SOLN
510.0000 mg | Freq: Once | INTRAVENOUS | Status: AC
  Administered 2018-02-05: 510 mg via INTRAVENOUS
  Filled 2018-02-04: qty 17

## 2018-02-04 MED ORDER — ALUM & MAG HYDROXIDE-SIMETH 200-200-20 MG/5ML PO SUSP
30.0000 mL | Freq: Once | ORAL | Status: AC
  Administered 2018-02-04: 30 mL via ORAL
  Filled 2018-02-04: qty 30

## 2018-02-04 MED ORDER — DIAZEPAM 5 MG PO TABS: 2.5000 mg | ORAL_TABLET | Freq: Three times a day (TID) | ORAL | Status: DC | PRN

## 2018-02-04 MED ORDER — ACETAMINOPHEN 325 MG PO TABS
650.0000 mg | ORAL_TABLET | Freq: Four times a day (QID) | ORAL | Status: DC | PRN
  Administered 2018-02-04 – 2018-02-05 (×3): 650 mg via ORAL
  Filled 2018-02-04 (×3): qty 2

## 2018-02-04 MED ORDER — FUROSEMIDE 10 MG/ML IJ SOLN
20.0000 mg | Freq: Once | INTRAMUSCULAR | Status: AC
  Administered 2018-02-04: 20 mg via INTRAVENOUS
  Filled 2018-02-04: qty 2

## 2018-02-04 MED ORDER — PEG-KCL-NACL-NASULF-NA ASC-C 100 G PO SOLR
0.5000 | Freq: Once | ORAL | Status: AC
  Administered 2018-02-04: 100 g via ORAL
  Filled 2018-02-04: qty 1

## 2018-02-04 MED ORDER — HEPARIN BOLUS VIA INFUSION
3000.0000 [IU] | Freq: Once | INTRAVENOUS | Status: AC
  Administered 2018-02-04: 3000 [IU] via INTRAVENOUS
  Filled 2018-02-04: qty 3000

## 2018-02-04 MED ORDER — ACETAMINOPHEN 650 MG RE SUPP: 650.0000 mg | Freq: Four times a day (QID) | RECTAL | Status: DC | PRN

## 2018-02-04 MED ORDER — SODIUM CHLORIDE 0.9 % IV SOLN: 250.0000 mL | INTRAVENOUS | Status: DC | PRN

## 2018-02-04 MED ORDER — SODIUM CHLORIDE 0.9% FLUSH
3.0000 mL | Freq: Two times a day (BID) | INTRAVENOUS | Status: DC
  Administered 2018-02-04 – 2018-02-05 (×4): 3 mL via INTRAVENOUS

## 2018-02-04 MED ORDER — HYDRALAZINE HCL 20 MG/ML IJ SOLN: 10.0000 mg | Freq: Four times a day (QID) | INTRAMUSCULAR | Status: DC | PRN

## 2018-02-04 MED ORDER — SODIUM CHLORIDE 0.9% FLUSH
3.0000 mL | Freq: Two times a day (BID) | INTRAVENOUS | Status: DC
  Administered 2018-02-04 – 2018-02-07 (×7): 3 mL via INTRAVENOUS

## 2018-02-04 MED ORDER — SODIUM CHLORIDE 0.9% IV SOLUTION: Freq: Once | INTRAVENOUS | Status: AC

## 2018-02-04 MED ORDER — ONDANSETRON HCL 4 MG PO TABS: 4.0000 mg | ORAL_TABLET | Freq: Four times a day (QID) | ORAL | Status: DC | PRN

## 2018-02-04 MED ORDER — PEG-KCL-NACL-NASULF-NA ASC-C 100 G PO SOLR: 1.0000 | Freq: Once | ORAL | Status: DC

## 2018-02-04 MED ORDER — HEPARIN (PORCINE) 25000 UT/250ML-% IV SOLN
1200.0000 [IU]/h | INTRAVENOUS | Status: AC
  Administered 2018-02-05: 1200 [IU]/h via INTRAVENOUS
  Filled 2018-02-04: qty 250

## 2018-02-04 MED ORDER — SODIUM CHLORIDE 0.9% FLUSH: 3.0000 mL | INTRAVENOUS | Status: DC | PRN

## 2018-02-04 MED ORDER — PEG-KCL-NACL-NASULF-NA ASC-C 100 G PO SOLR
0.5000 | Freq: Once | ORAL | Status: AC
  Administered 2018-02-05: 100 g via ORAL

## 2018-02-04 MED ORDER — ONDANSETRON HCL 4 MG/2ML IJ SOLN: 4.0000 mg | Freq: Four times a day (QID) | INTRAMUSCULAR | Status: DC | PRN

## 2018-02-04 NOTE — Progress Notes (Addendum)
[place] for IV heparin Indication: pulmonary embolus  No Known Allergies  Patient Measurements: Height: 5\' 5"  (165.1 cm) Weight: 183 lb (83 kg) IBW/kg (Calculated) : 57 Heparin Dosing Weight: 74 kg  Vital Signs: Temp: 98.5 F (36.9 C) (11/22 0715) Temp Source: Oral (11/22 0715) BP: 170/86 (11/22 1000) Pulse Rate: 71 (11/22 1000)  Labs: Recent Labs    02/03/18 1152 02/03/18 1659 02/04/18 0130  HGB 5.6*  --  5.6*  HCT 20.2*  --  21.4*  PLT 286  --  263  CREATININE 0.91 0.90 0.88    Estimated Creatinine Clearance: 58.8 mL/min (by C-G formula based on SCr of 0.88 mg/dL).   Medical History: Past Medical History:  Diagnosis Date  . Arthritis   . Asthma   . Hyperlipidemia   . Hypertension   . Migraines   . Prediabetes   . Vertigo   . Vitamin D deficiency     Medications:   (Not in a hospital admission) Scheduled:  . heparin  2,000 Units Intravenous Once  . sodium chloride flush  3 mL Intravenous Q12H  . sodium chloride flush  3 mL Intravenous Q12H   PRN: sodium chloride, acetaminophen **OR** acetaminophen, diazepam, ondansetron **OR** ondansetron (ZOFRAN) IV, sodium chloride flush  Assessment: 56 yoF with PMH HTN, HLD, vertigo, Afib not on anticoag d/t patient refusal presents 11/21 with exertional dyspnea, lightheadedness. PCP visit revealed severe anemia with Hgb 5.6 and acute PE. Admitted for management; Pharmacy to dose IV heparin.   Baseline INR, aPTT: pending  Prior anticoagulation: none  Significant events:  11/22: 2nd unit of blood still needs to finish infusing prior to CBC recheck, but MD would like to start heparin now  Today, 02/04/2018:  CBC: Hgb markedly low, CBC not rechecked following PRBC x 2  No signs of bleeding despite low hemoglobin; FOBT negative  CrCl: 59 ml/min  Goal of Therapy: Heparin level 0.3-0.7 units/ml Monitor platelets by anticoagulation protocol: Yes  Plan:  Heparin  2000 units IV bolus x 1; avoiding full bolus as CBC has not been rechecked. However since patient has no clear signs of bleeding and is receiving 2 units RBC, will give small bolus now. Will f/u CBC and give remainder of full bolus (additional 3000 units for 5000 units total) if Hgb improved and still no signs of bleeding  Heparin 1300 units/hr IV infusion  Check heparin level 8 hrs after start  Daily CBC, daily heparin level once stable  Monitor for signs of bleeding or thrombosis   Reuel Boom, PharmD, BCPS [phone] 02/04/2018, 10:12 AM   ADDENDUM  Repeat CBC improved after 2 units of blood; Hgb 5.6 >> 7.6; Plt wnl  No new s/s bleeding per RN  Plan:  Will complete initial PE bolus with 3000 units heparin for a total of 5000 units  Reuel Boom, PharmD, BCPS [phone] 02/04/2018, 1:26 PM

## 2018-02-04 NOTE — Progress Notes (Signed)
  Echocardiogram 2D Echocardiogram has been performed.  Marybelle Killings 02/04/2018, 4:13 PM

## 2018-02-04 NOTE — ED Notes (Signed)
Date and time results received: 02/04/18 0248   Test: Hgb Critical Value: 5.6  Name of Provider Notified: [place], MD  Orders Received? Or Actions Taken?

## 2018-02-04 NOTE — H&P (Signed)
History and Physical    Lori [name] CHE:[id number] DOB: [birth year] DOA: 02/04/2018  PCP: Unk Pinto, MD   Patient coming from: Home   Chief Complaint: DOE, lightheaded   HPI: Lori [name] is a 77 y.o. female with medical history significant for hypertension and hyperlipidemia managed with diet, vertigo, and atrial fibrillation not anticoagulated due to patient refusal, now presenting to the emergency department with more than a month of exertional dyspnea, lightheadedness, and severe anemia and acute PE on outpatient work-up.  The patient was seen by her PCP for her complaint of DOE and her outpatient work-up revealed hemoglobin of 5.6 and acute PE.  She denies any significant cough or dyspnea at rest.  Denies fevers or chills.  Denies melena or hematochezia.  Denies abdominal pain, nausea, or vomiting.  She reports occasional fleeting chest pains, usually while laying down at night.  Denies exertional chest discomfort.  Reports that her ankles may be slightly swollen bilaterally, but denies any lower extremity tenderness.  ED Course: Upon arrival to the ED, patient is found to be afebrile, saturating well on room air, and with vitals otherwise normal.  Chemistry panel is unremarkable and CBC notable for hemoglobin of 5.6, down from 12.1 in August.  MCV is now 71.9, down from 93.8 in August.  Chest x-ray featured an enlarged cardiac silhouette with concern for possible pericardial effusion and right hilar prominence concerning for adenopathy.  This was followed by CTA chest with suspicion for acute LLL pulmonary embolism and small pericardial effusion but no significant adenopathy.  Type and screen was performed and 2 units of RBC was ordered for transfusion.  Patient remains hemodynamically stable and will be admitted for ongoing evaluation and management.  Review of Systems:  All other systems reviewed and apart from HPI, are negative.  Past Medical History:  Diagnosis Date  . Arthritis    . Asthma   . Hyperlipidemia   . Hypertension   . Migraines   . Prediabetes   . Vertigo   . Vitamin D deficiency     Past Surgical History:  Procedure Laterality Date  . CATARACT EXTRACTION W/ INTRAOCULAR LENS  IMPLANT, BILATERAL  2009  . CEREBRAL ANEURYSM REPAIR    . FOOT SURGERY  1993  . TUBAL LIGATION       reports that she quit smoking about 27 years ago. She has never used smokeless tobacco. She reports that she does not drink alcohol or use drugs.  No Known Allergies  Family History  Problem Relation Age of Onset  . Hypertension Mother   . Diabetes Mother   . Cancer Mother        Pancreatic  . Cancer Father        Skin     Prior to Admission medications   Medication Sig Start Date End Date Taking? Authorizing Provider  aspirin EC 81 MG tablet Take 162 mg by mouth daily.   Yes [provider]  Cholecalciferol (VITAMIN D PO) Take 2,000-4,000 mg by mouth daily.    Yes [provider]  diazepam (VALIUM) 5 MG tablet Take 1/2 to 1 tab tid prn for anxiety or VERTIGO 10/30/17  Yes Unk Pinto, MD  Multiple Vitamins-Minerals (CENTRUM SILVER 50+WOMEN PO) Take 1 tablet by mouth daily.    Yes [provider]  predniSONE (DELTASONE) 10 MG tablet TAKE 2 TABLETS BY MOUTH DAILY WITH BREAKFAST. Patient taking differently: Take 20 mg by mouth daily as needed (leg pain).  09/15/17  Yes Sharol Given,  Illene Regulus, MD  TURMERIC PO Take 1 tablet by mouth daily.    Yes [provider]    Physical Exam: Vitals:   02/04/18 0046  BP: (!) 175/66  Pulse: 93  Resp: 16  Temp: 98.7 F (37.1 C)  TempSrc: Oral  SpO2: 99%  Weight: 83 kg  Height: 5\' 5"  (1.651 m)    Constitutional: NAD, calm, pale  Eyes: PERTLA, lids and conjunctivae normal ENMT: Mucous membranes are moist. Posterior pharynx clear of any exudate or lesions.   Neck: normal, supple, no masses, no thyromegaly Respiratory: clear to auscultation bilaterally, no wheezing, no crackles. Normal  respiratory effort.   Cardiovascular: S1 & S2 heard, regular rate and rhythm. Trace BLE edema. Abdomen: No distension, no tenderness, soft. Bowel sounds normal.  Musculoskeletal: no clubbing / cyanosis. No joint deformity upper and lower extremities.    Skin: no significant rashes, lesions, ulcers. Pale. Neurologic: CN 2-12 grossly intact. Sensation intact. Strength 5/5 in all 4 limbs.  Psychiatric: Alert and oriented x 3. Very pleasant, cooperative.    Labs on Admission: I have personally reviewed following labs and imaging studies  CBC: Recent Labs  Lab 02/03/18 1152  WBC 4.8  NEUTROABS 3,298  HGB 5.6*  HCT 20.2*  MCV 71.9*  PLT 734   Basic Metabolic Panel: Recent Labs  Lab 02/03/18 1152 02/03/18 1659  NA 144  --   K 4.4  --   CL 110  --   CO2 26  --   GLUCOSE 102*  --   BUN 20  --   CREATININE 0.91 0.90  CALCIUM 8.9  --    GFR: Estimated Creatinine Clearance: 57.5 mL/min (by C-G formula based on SCr of 0.9 mg/dL). Liver Function Tests: Recent Labs  Lab 02/03/18 1152  AST 20  ALT 20  BILITOT 0.4  PROT 5.9*   No results for input(s): LIPASE, AMYLASE in the last 168 hours. No results for input(s): AMMONIA in the last 168 hours. Coagulation Profile: No results for input(s): INR, PROTIME in the last 168 hours. Cardiac Enzymes: No results for input(s): CKTOTAL, CKMB, CKMBINDEX, TROPONINI in the last 168 hours. BNP (last 3 results) No results for input(s): PROBNP in the last 8760 hours. HbA1C: No results for input(s): HGBA1C in the last 72 hours. CBG: No results for input(s): GLUCAP in the last 168 hours. Lipid Profile: Recent Labs    02/03/18 1152  CHOL 93  HDL 39*  LDLCALC 35  TRIG 102  CHOLHDL 2.4   Thyroid Function Tests: Recent Labs    02/03/18 1152  TSH 3.14   Anemia Panel: No results for input(s): VITAMINB12, FOLATE, FERRITIN, TIBC, IRON, RETICCTPCT in the last 72 hours. Urine analysis:    Component Value Date/Time   COLORURINE DARK  YELLOW 04/09/2017 1155   APPEARANCEUR TURBID (A) 04/09/2017 1155   LABSPEC 1.036 (H) 04/09/2017 1155   PHURINE < OR = 5.0 04/09/2017 1155   GLUCOSEU TRACE (A) 04/09/2017 1155   HGBUR NEGATIVE 04/09/2017 1155   BILIRUBINUR NEGATIVE 02/11/2016 1604   KETONESUR NEGATIVE 04/09/2017 1155   PROTEINUR 1+ (A) 04/09/2017 1155   NITRITE NEGATIVE 04/09/2017 1155   LEUKOCYTESUR 1+ (A) 04/09/2017 1155   Sepsis Labs: @LABRCNTIP (procalcitonin:4,lacticidven:4) )No results found for this or any previous visit (from the past 240 hour(s)).   Radiological Exams on Admission: Dg Chest 2 View  Result Date: 02/03/2018 CLINICAL DATA:  Pt states 1 month of new sob with any type of walking or activity, she is unaware of  any heart disease, hx non smoker, no meds for htn or diabetes EXAM: CHEST - 2 VIEW COMPARISON:  06/30/2017 FINDINGS: The heart is enlarged compared to prior study. There is fullness in the RIGHT hilar and suprahilar regions, raising the question of adenopathy. The lungs are clear. No pulmonary edema. Mild midthoracic spondylosis. Note is made of a hiatal hernia. IMPRESSION: 1. Enlarged cardiopericardial silhouette. Question of pericardial effusion. 2. Fullness in the RIGHT hilar/suprahilar regions, raising the question of adenopathy. Recommend further evaluation with chest CT. Recommend intravenous contrast unless contraindicated. These results will be called to the ordering clinician or representative by the Radiologist Assistant, and communication documented in the PACS or zVision Dashboard. Electronically Signed   By: Nolon Nations M.D.   On: 02/03/2018 13:25   Ct Chest W Contrast  Result Date: 02/03/2018 CLINICAL DATA:  Patient with dry cough for 3 weeks. Evaluate for possible hilar mass. EXAM: CT CHEST WITH CONTRAST TECHNIQUE: Multidetector CT imaging of the chest was performed during intravenous contrast administration. CONTRAST:  60mL OMNIPAQUE IOHEXOL 300 MG/ML  SOLN COMPARISON:  Chest  radiograph 02/03/2018 FINDINGS: Cardiovascular: Heart is mildly enlarged. Small pericardial effusion. Aorta and main pulmonary artery normal in caliber. Within the left lower lobe segmental pulmonary artery there is a filling defect (image 79; series 2) most compatible with pulmonary embolus. Mediastinum/Nodes: Moderate-sized hiatal hernia. No axillary, mediastinal or hilar lymphadenopathy. Lungs/Pleura: Central airways are patent. Dependent atelectasis within the bilateral lower lobes. No large area pulmonary consolidation. No pleural effusion or pneumothorax. Upper Abdomen: No acute process. Musculoskeletal: Thoracic spine degenerative changes. No aggressive or acute appearing osseous lesions. IMPRESSION: 1. There is a an acute segmental pulmonary embolus within the left lower lobe pulmonary arteries. 2. No large area of pulmonary consolidation. 3. No mediastinal or hilar mass. Critical Value/emergent results were called by telephone at the time of interpretation on 02/03/2018 at 5:33 pm to [facility] , who verbally acknowledged these results. Electronically Signed   By: Lovey Newcomer M.D.   On: 02/03/2018 17:37    EKG: Ordered, not yet performed.   Assessment/Plan   1. Symptomatic anemia  - Presents with >1 month of DOE and lightheadedness, was evaluated PCP and sent to ED when workup revealed Hgb of 5.6 and acute PE  - She denies melena or hematochezia, or easy bruising or bleeding  - Type and screen performed in ED and 2 units RBC ordered  - Check FOBT and anemia panel, hold ASA for now, check post-transfusion CBC    2. Pulmonary embolism  - CT concerning for acute PE involving LLL  - She is hemodynamically stable and without hypoxia or respiratory distress, so will hold off on anticoagulating pending further eval of anemia  - Check LE dopplers, troponin, echocardiogram    3. Atrial fibrillation  - CHADS-VASc is at least 69 (age x2, gender)  - PCP has advised anticoagulation for  stroke-prevention but patient has declined    DVT prophylaxis: SCD's Code Status: Full  Family Communication: Discussed with patient  Consults called: None Admission status: Inpatient     Vianne Bulls, MD [facility] Pager [phone]  If 7PM-7AM, please contact night-coverage [email or web address] Password [email or web address]  02/04/2018, 1:54 AM

## 2018-02-04 NOTE — Clinical Social Work Note (Addendum)
Clinical Social Work Assessment  Patient Details  Name: Lori [name] MRN: [id number] Date of Birth: 20-Feb-1941  Date of referral:  02/04/18               Reason for consult:  Other (Comment Required)(Patient being admitted but is concerned about her animals at home.)                Permission sought to share information with:    Permission granted to share information::     Name::        Agency::     Relationship::     Contact Information:     Housing/Transportation Living arrangements for the past 2 months:  Single Family Home Source of Information:  Patient Patient Interpreter Needed:  None Criminal Activity/Legal Involvement Pertinent to Current Situation/Hospitalization:  No - Comment as needed Significant Relationships:  Adult Children, Community Support, Friend, Other Family Members Lives with:  Self Do you feel safe going back to the place where you live?  Yes Need for family participation in patient care:     Care giving concerns: CSW consulted as patient needs assistance.   Social Worker assessment / plan: CSW spoke with patient at bedside. Per patient, she received a phone call from her doctor last night who informed her she needed to go to the ED. Patient reported she was flustered because someone needed to take care of her animals. Patient stated she fed her animals and then came to the hospital. Per patient, this is her present concern as she is supposed to be admitted but has no one who can care for her animals. Patient states she has at least 15-20 animals (goats, cats and dogs). Patient has 5 acres of land. 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 Patient reports that her animals have a certain routine and she is too independent and headstrong to have anyone else do it. Per patient, she would like to be back home by 4pm. CSW explained that the EDP feels patient needs to be admitted and that it may be for a couple of nights. CSW discussed  with patient that she has the right to self-determination but that she would need to understand the risks of leaving AMA. Per patient, if she needed to be admitted then she would continue to call people to see if they could come care for her animals. Patient states she has a friend, Pamala Hurry, who may be able to help but she cannot get a hold of her. CSW offered to reach out to family and friends to help assist but patient declined and stated she would do it. CSW stated she would give patient some time and come back and check on her.   Employment status:    Insurance information:  Managed Medicare(HTA) PT Recommendations:  Not assessed at this time Information / Referral to community resources:     Patient/Family's Response to care:  Patient wanting to leave by 4 as she needs to care for her animals. Patient understanding that the EDP feels patient would benefit from an admission. Patient hesitant due to her animals but is agreeable to continue to reach out to friends to help. Patient appreciative of CSW's help.   Patient/Family's Understanding of and Emotional Response to Diagnosis, Current Treatment, and Prognosis:  Patient understanding that she will need to be admitted but reluctant due to animals. Patient currently trying to reach out to friends to see if they can help take care  of her animals.  Emotional Assessment Appearance:  Appears stated age Attitude/Demeanor/Rapport:  Engaged Affect (typically observed):  Calm, Appropriate, Overwhelmed Orientation:  Oriented to Self, Oriented to Place, Oriented to  Time, Oriented to Situation Alcohol / Substance use:    Psych involvement (Current and /or in the community):     Discharge Needs  Concerns to be addressed:  No discharge needs identified Readmission within the last 30 days:  No Current discharge risk:  None Barriers to Discharge:  No Barriers Identified   Ollen Barges, [place] 02/04/2018, 9:43 AM

## 2018-02-04 NOTE — ED Provider Notes (Addendum)
[facility] DEPT Provider Note: Georgena Spurling, MD, FACEP  CSN: [id number] MRN: [id number] ARRIVAL: 02/04/18 at [place]: [place]  Abnormal Lab   HISTORY OF PRESENT ILLNESS  02/04/18 1:05 AM Lori [name] is a 77 y.o. female with about a 1 month history of generalized weakness and dyspnea on exertion.  She has also had occasional cough but no chest pain.  She saw her PCP yesterday who ordered lab work and a chest x-ray.  The CBC showed a hemoglobin of 5.6.  Chest x-ray was suspicious for lymphadenopathy and a CT of the chest was ordered.  This showed a left lower lobe segmental pulmonary embolism.  She denies melena or hematochezia.  She states her lower legs are swollen which is not usual for her.    Past Medical History:  Diagnosis Date  . Arthritis   . Asthma   . Hyperlipidemia   . Hypertension   . Migraines   . Prediabetes   . Vertigo   . Vitamin D deficiency     Past Surgical History:  Procedure Laterality Date  . CATARACT EXTRACTION W/ INTRAOCULAR LENS  IMPLANT, BILATERAL  2009  . CEREBRAL ANEURYSM REPAIR    . FOOT SURGERY  1993  . TUBAL LIGATION      Family History  Problem Relation Age of Onset  . Hypertension Mother   . Diabetes Mother   . Cancer Mother        Pancreatic  . Cancer Father        Skin    Social History   Tobacco Use  . Smoking status: Former Smoker    Last attempt to quit: 08/07/1990    Years since quitting: 27.5  . Smokeless tobacco: Never Used  Substance Use Topics  . Alcohol use: No    Alcohol/week: 0.0 standard drinks  . Drug use: No    Prior to Admission medications   Medication Sig Start Date End Date Taking? Authorizing Provider  aspirin EC 81 MG tablet Take 162 mg by mouth daily.   Yes [provider]  Cholecalciferol (VITAMIN D PO) Take 2,000-4,000 mg by mouth daily.    Yes [provider]  diazepam (VALIUM) 5 MG tablet Take 1/2 to 1 tab tid prn for anxiety or VERTIGO 10/30/17  Yes  Unk Pinto, MD  Multiple Vitamins-Minerals (CENTRUM SILVER 50+WOMEN PO) Take 1 tablet by mouth daily.    Yes [provider]  predniSONE (DELTASONE) 10 MG tablet TAKE 2 TABLETS BY MOUTH DAILY WITH BREAKFAST. Patient taking differently: Take 20 mg by mouth daily as needed (leg pain).  09/15/17  Yes Newt Minion, MD  TURMERIC PO Take 1 tablet by mouth daily.    Yes [provider]    Allergies Patient has no known allergies.   REVIEW OF SYSTEMS  Negative except as noted here or in the History of Present Illness.   PHYSICAL EXAMINATION  Initial Vital Signs Blood pressure (!) 175/66, pulse 93, temperature 98.7 F (37.1 C), temperature source Oral, resp. rate 16, height 5\' 5"  (1.651 m), weight 83 kg, SpO2 99 %.  Examination General: Well-developed, well-nourished female in no acute distress; appearance consistent with age of record HENT: normocephalic; atraumatic Eyes: pupils equal, round and reactive to light; extraocular muscles intact Neck: supple Heart: regular rate and rhythm; no murmurs, rubs or gallops Lungs: clear to auscultation bilaterally Abdomen: soft; nondistended; nontender; no masses or hepatosplenomegaly; bowel sounds present Extremities: No deformity; full range of motion;  pulses normal; trace edema of lower legs Neurologic: Awake, alert and oriented; motor function intact in all extremities and symmetric; no facial droop Skin: Warm and dry Psychiatric: Normal mood and affect   RESULTS  Summary of this visit's results, reviewed by myself:   EKG Interpretation  Date/Time:  Friday February 04 2018 02:13:17 EST Ventricular Rate:  80 PR Interval:    QRS Duration: 91 QT Interval:  382 QTC Calculation: 441 R Axis:   70 Text Interpretation:  Sinus rhythm with sinus pause No previous ECGs available Confirmed by Sennie Borden, Jenny Reichmann [phone]) on 02/04/2018 2:16:21 AM      Laboratory Studies: Results for orders placed or performed during the hospital  encounter of 02/04/18 (from the past 24 hour(s))  Type and screen     Status: None (Preliminary result)   Collection Time: 02/04/18  1:30 AM  Result Value Ref Range   ABO/RH(D) A POS    Antibody Screen PENDING    Sample Expiration      02/07/2018 Performed at [facility], [place] [address] [place], [place] [address]   Prepare RBC     Status: None   Collection Time: 02/04/18  1:30 AM  Result Value Ref Range   Order Confirmation      ORDER PROCESSED BY BLOOD BANK Performed at [facility] [address], [place], [place] [address]    Imaging Studies: Dg Chest 2 View  Result Date: 02/03/2018 CLINICAL DATA:  Pt states 1 month of new sob with any type of walking or activity, she is unaware of any heart disease, hx non smoker, no meds for htn or diabetes EXAM: CHEST - 2 VIEW COMPARISON:  06/30/2017 FINDINGS: The heart is enlarged compared to prior study. There is fullness in the RIGHT hilar and suprahilar regions, raising the question of adenopathy. The lungs are clear. No pulmonary edema. Mild midthoracic spondylosis. Note is made of a hiatal hernia. IMPRESSION: 1. Enlarged cardiopericardial silhouette. Question of pericardial effusion. 2. Fullness in the RIGHT hilar/suprahilar regions, raising the question of adenopathy. Recommend further evaluation with chest CT. Recommend intravenous contrast unless contraindicated. These results will be called to the ordering clinician or representative by the Radiologist Assistant, and communication documented in the PACS or zVision Dashboard. Electronically Signed   By: Nolon Nations M.D.   On: 02/03/2018 13:25   Ct Chest W Contrast  Result Date: 02/03/2018 CLINICAL DATA:  Patient with dry cough for 3 weeks. Evaluate for possible hilar mass. EXAM: CT CHEST WITH CONTRAST TECHNIQUE: Multidetector CT imaging of the chest was performed during intravenous contrast administration. CONTRAST:  63mL OMNIPAQUE IOHEXOL 300  MG/ML  SOLN COMPARISON:  Chest radiograph 02/03/2018 FINDINGS: Cardiovascular: Heart is mildly enlarged. Small pericardial effusion. Aorta and main pulmonary artery normal in caliber. Within the left lower lobe segmental pulmonary artery there is a filling defect (image 79; series 2) most compatible with pulmonary embolus. Mediastinum/Nodes: Moderate-sized hiatal hernia. No axillary, mediastinal or hilar lymphadenopathy. Lungs/Pleura: Central airways are patent. Dependent atelectasis within the bilateral lower lobes. No large area pulmonary consolidation. No pleural effusion or pneumothorax. Upper Abdomen: No acute process. Musculoskeletal: Thoracic spine degenerative changes. No aggressive or acute appearing osseous lesions. IMPRESSION: 1. There is a an acute segmental pulmonary embolus within the left lower lobe pulmonary arteries. 2. No large area of pulmonary consolidation. 3. No mediastinal or hilar mass. Critical Value/emergent results were called by telephone at the time of interpretation on 02/03/2018 at 5:33 pm to [facility] , who  verbally acknowledged these results. Electronically Signed   By: Lovey Newcomer M.D.   On: 02/03/2018 17:37    ED COURSE and MDM  Nursing notes and initial vitals signs, including pulse oximetry, reviewed.  Vitals:   02/04/18 0046 02/04/18 0158  BP: (!) 175/66 (!) 158/84  Pulse: 93 80  Resp: 16 13  Temp: 98.7 F (37.1 C)   TempSrc: Oral   SpO2: 99% 100%  Weight: 83 kg   Height: 5\' 5"  (1.651 m)    Two units packed red blood cells ordered for transfusion.  Will have patient admitted for further work-up.  PROCEDURES    ED DIAGNOSES     ICD-10-CM   1. Symptomatic anemia D64.9   2. Single subsegmental pulmonary embolism without acute cor pulmonale I26.93        Kyron Schlitt, MD 02/04/18 0216    Shanon Rosser, MD 02/04/18 0330

## 2018-02-04 NOTE — H&P (View-Only) (Signed)
Referring Provider: [facility]   Primary Care Physician:  Unk Pinto, MD Primary Gastroenterologist:  Previously Dr. Olevia Perches Reason for Consultation:  Anemia    ASSESSMENT    46. 77 yo female with newly diagnosed left PE, on IV heparin.   2. Recurrent, severe anemia (on NSAIDS). She is iron deficient. First found in April, took iron for a while and hgb rose to 12.1 by mid August. No recent iron and hgb down to 5.6. No overt bleeding, she is heme negative.  -Needs endoscopic evaluation for iron deficiency anemia. The risks and benefits of colonoscopy and EGD were discussed and the patient agrees to proceed. Will prep today for am procedures.  -Placed order for Pharmacy to hold IV heparin at 6 am. Tomorrow morning -Hgb up appropriately to 7.6 post 2 units of blood -She should get either inpatient IV iron or PO iron (upon discharge)    Attending Physician Note   I have taken a history, examined the patient and reviewed the chart. I agree with the Advanced Practitioner's note, impression and recommendations.   Iron deficiency anemia and heme negative stool. New PE on IV heparin. Trend CBC. Recommend IV Fe infusion in hospital and appropriate additional Fe. Colonoscopy and EGD tomorrow for further evaluation.    Lucio Edward, MD [facility] [phone]      HPI: Lori [name] is a 77 y.o. female seen in ED in April with anemia, she left without workup because animals were at home alone. She didn't get GI evaluation but sent home on iron which she took for a month. Hgb in mid August was 12.1.  Patient recently developed worsening of her chronic shortness of breath. PCP sent her for a chest CT scan yesterday which showed a left PE, sent to ED.  In ED she patient was found to have a hgb of 5.6 and she is iron deficient. She denies any overt GI blood loss at home and is heme negative. She has been taking two baby asa daily for a few months and occasionally takes Aleve. BMs are  normal. No unusual weight loss.  No N/V.  No FHM of colon cancer. Her last colonoscopy was > 10  Years ago, I cannot find results  Past Medical History:  Diagnosis Date  . Arthritis   . Asthma   . Hyperlipidemia   . Hypertension   . Migraines   . Prediabetes   . Vertigo   . Vitamin D deficiency     Past Surgical History:  Procedure Laterality Date  . CATARACT EXTRACTION W/ INTRAOCULAR LENS  IMPLANT, BILATERAL  2009  . CEREBRAL ANEURYSM REPAIR    . FOOT SURGERY  1993  . TUBAL LIGATION      Prior to Admission medications   Medication Sig Start Date End Date Taking? Authorizing Provider  aspirin EC 81 MG tablet Take 162 mg by mouth daily.   Yes [provider]  Cholecalciferol (VITAMIN D PO) Take 2,000-4,000 mg by mouth daily.    Yes [provider]  diazepam (VALIUM) 5 MG tablet Take 1/2 to 1 tab tid prn for anxiety or VERTIGO 10/30/17  Yes Unk Pinto, MD  Multiple Vitamins-Minerals (CENTRUM SILVER 50+WOMEN PO) Take 1 tablet by mouth daily.    Yes [provider]  predniSONE (DELTASONE) 10 MG tablet TAKE 2 TABLETS BY MOUTH DAILY WITH BREAKFAST. Patient taking differently: Take 20 mg by mouth daily as needed (leg pain).  09/15/17  Yes Newt Minion, MD  TURMERIC  PO Take 1 tablet by mouth daily.    Yes [provider]    Current Facility-Administered Medications  Medication Dose Route Frequency Provider Last Rate Last Dose  . 0.9 %  sodium chloride infusion (Manually program via Guardrails IV Fluids)   Intravenous Once Regalado, Belkys A, MD      . 0.9 %  sodium chloride infusion  250 mL Intravenous PRN Opyd, Ilene Qua, MD      . acetaminophen (TYLENOL) tablet 650 mg  650 mg Oral Q6H PRN Opyd, Ilene Qua, MD       Or  . acetaminophen (TYLENOL) suppository 650 mg  650 mg Rectal Q6H PRN Opyd, Ilene Qua, MD      . diazepam (VALIUM) tablet 2.5-5 mg  2.5-5 mg Oral TID PRN Opyd, Ilene Qua, MD      . ferumoxytol (FERAHEME) 510 mg in sodium chloride  0.9 % 100 mL IVPB  510 mg Intravenous Once Regalado, Belkys A, MD      . furosemide (LASIX) injection 20 mg  20 mg Intravenous Once Regalado, Belkys A, MD      . heparin ADULT infusion 100 units/mL (25000 units/257mL sodium chloride 0.45%)  1,300 Units/hr Intravenous Continuous Polly Cobia, RPH 13 mL/hr at 02/04/18 1037 1,300 Units/hr at 02/04/18 1037  . heparin bolus via infusion 3,000 Units  3,000 Units Intravenous Once Wofford, Drew A, RPH      . ondansetron (ZOFRAN) tablet 4 mg  4 mg Oral Q6H PRN Opyd, Ilene Qua, MD       Or  . ondansetron (ZOFRAN) injection 4 mg  4 mg Intravenous Q6H PRN Opyd, Ilene Qua, MD      . sodium chloride flush (NS) 0.9 % injection 3 mL  3 mL Intravenous Q12H Opyd, Ilene Qua, MD   3 mL at 02/04/18 1031  . sodium chloride flush (NS) 0.9 % injection 3 mL  3 mL Intravenous Q12H Opyd, Ilene Qua, MD   3 mL at 02/04/18 1031  . sodium chloride flush (NS) 0.9 % injection 3 mL  3 mL Intravenous PRN Opyd, Ilene Qua, MD        Allergies as of 02/04/2018  . (No Known Allergies)    Family History  Problem Relation Age of Onset  . Hypertension Mother   . Diabetes Mother   . Cancer Mother        Pancreatic  . Cancer Father        Skin    Social History   Socioeconomic History  . Marital status: Divorced    Spouse name: Not on file  . Number of children: Not on file  . Years of education: Not on file  . Highest education level: Not on file  Occupational History  . Not on file  Social Needs  . Financial resource strain: Not on file  . Food insecurity:    Worry: Not on file    Inability: Not on file  . Transportation needs:    Medical: Not on file    Non-medical: Not on file  Tobacco Use  . Smoking status: Former Smoker    Last attempt to quit: 08/07/1990    Years since quitting: 27.5  . Smokeless tobacco: Never Used  Substance and Sexual Activity  . Alcohol use: No    Alcohol/week: 0.0 standard drinks  . Drug use: No  . Sexual activity: Not on file   Lifestyle  . Physical activity:    Days per week: Not on file  Minutes per session: Not on file  . Stress: Not on file  Relationships  . Social connections:    Talks on phone: Not on file    Gets together: Not on file    Attends religious service: Not on file    Active member of club or organization: Not on file    Attends meetings of clubs or organizations: Not on file    Relationship status: Not on file  . Intimate partner violence:    Fear of current or ex partner: Not on file    Emotionally abused: Not on file    Physically abused: Not on file    Forced sexual activity: Not on file  Other Topics Concern  . Not on file  Social History Narrative  . Not on file    Review of Systems: All systems reviewed and negative except where noted in HPI.  Physical Exam: Vital signs in last 24 hours: Temp:  [98 F (36.7 C)-98.7 F (37.1 C)] 98 F (36.7 C) (11/22 1205) Pulse Rate:  [67-93] 76 (11/22 1205) Resp:  [10-26] 24 (11/22 1205) BP: (140-179)/(66-100) 179/82 (11/22 1205) SpO2:  [98 %-100 %] 100 % (11/22 1205) Weight:  [81.9 kg-83 kg] 81.9 kg (11/22 1202)   General:   Alert, well-developed, female in NAD Psych:  cooperative. Normal mood and affect. Eyes:  Pupils equal, sclera clear, no icterus.   Conjunctiva pale. Ears:  Normal auditory acuity. Nose:  No deformity, discharge,  or lesions. Neck:  Supple; no masses Lungs:  Clear throughout to auscultation.   No wheezes, crackles, or rhonchi.  Heart:  Regular rate and rhythm, 1+ LLE edema Abdomen:  Soft, non-distended, nontender, BS active, no palp mass    Rectal:  Deferred  Msk:  Symmetrical without gross deformities. . Neurologic:  Alert and  oriented x4;  grossly normal neurologically. Skin:  Intact without significant lesions or rashes.   Intake/Output from previous day: 11/21 0701 - 11/22 0700 In: 945 [Blood:945] Out: -  Intake/Output this shift: Total I/O In: 3 [I.V.:3] Out: -   Lab Results: Recent Labs     02/03/18 1152 02/04/18 0130 02/04/18 1139  WBC 4.8 4.9 4.5  HGB 5.6* 5.6* 7.6*  HCT 20.2* 21.4* 26.2*  PLT 286 263 192   BMET Recent Labs    02/03/18 1152 02/03/18 1659 02/04/18 0130  NA 144  --  144  K 4.4  --  4.0  CL 110  --  112*  CO2 26  --  26  GLUCOSE 102*  --  110*  BUN 20  --  24*  CREATININE 0.91 0.90 0.88  CALCIUM 8.9  --  8.9   LFT Recent Labs    02/03/18 1152  PROT 5.9*  AST 20  ALT 20  BILITOT 0.4   PT/INR Recent Labs    02/04/18 1009 02/04/18 1139  LABPROT SPECIMEN CLOTTED QUESTIONABLE RESULTS, RECOMMEND RECOLLECT TO VERIFY  INR SPECIMEN CLOTTED QUESTIONABLE RESULTS, RECOMMEND RECOLLECT TO VERIFY   Hepatitis Panel No results for input(s): HEPBSAG, HCVAB, HEPAIGM, HEPBIGM in the last 72 hours.    Studies/Results: Dg Chest 2 View  Result Date: 02/03/2018 CLINICAL DATA:  Pt states 1 month of new sob with any type of walking or activity, she is unaware of any heart disease, hx non smoker, no meds for htn or diabetes EXAM: CHEST - 2 VIEW COMPARISON:  06/30/2017 FINDINGS: The heart is enlarged compared to prior study. There is fullness in the RIGHT hilar and suprahilar regions, raising  the question of adenopathy. The lungs are clear. No pulmonary edema. Mild midthoracic spondylosis. Note is made of a hiatal hernia. IMPRESSION: 1. Enlarged cardiopericardial silhouette. Question of pericardial effusion. 2. Fullness in the RIGHT hilar/suprahilar regions, raising the question of adenopathy. Recommend further evaluation with chest CT. Recommend intravenous contrast unless contraindicated. These results will be called to the ordering clinician or representative by the Radiologist Assistant, and communication documented in the PACS or zVision Dashboard. Electronically Signed   By: Nolon Nations M.D.   On: 02/03/2018 13:25   Ct Chest W Contrast  Result Date: 02/03/2018 CLINICAL DATA:  Patient with dry cough for 3 weeks. Evaluate for possible hilar mass.  EXAM: CT CHEST WITH CONTRAST TECHNIQUE: Multidetector CT imaging of the chest was performed during intravenous contrast administration. CONTRAST:  34mL OMNIPAQUE IOHEXOL 300 MG/ML  SOLN COMPARISON:  Chest radiograph 02/03/2018 FINDINGS: Cardiovascular: Heart is mildly enlarged. Small pericardial effusion. Aorta and main pulmonary artery normal in caliber. Within the left lower lobe segmental pulmonary artery there is a filling defect (image 79; series 2) most compatible with pulmonary embolus. Mediastinum/Nodes: Moderate-sized hiatal hernia. No axillary, mediastinal or hilar lymphadenopathy. Lungs/Pleura: Central airways are patent. Dependent atelectasis within the bilateral lower lobes. No large area pulmonary consolidation. No pleural effusion or pneumothorax. Upper Abdomen: No acute process. Musculoskeletal: Thoracic spine degenerative changes. No aggressive or acute appearing osseous lesions. IMPRESSION: 1. There is a an acute segmental pulmonary embolus within the left lower lobe pulmonary arteries. 2. No large area of pulmonary consolidation. 3. No mediastinal or hilar mass. Critical Value/emergent results were called by telephone at the time of interpretation on 02/03/2018 at 5:33 pm to [facility] , who verbally acknowledged these results. Electronically Signed   By: Lovey Newcomer M.D.   On: 02/03/2018 17:37     Tye Savoy, NP-C @  02/04/2018, 1:58 PM

## 2018-02-04 NOTE — ED Notes (Signed)
ED TO INPATIENT HANDOFF REPORT  Name/Age/Gender Lori [name] 77 y.o. female  Code Status    Code Status Orders  (From admission, onward)         Start     Ordered   02/04/18 0152  Full code  Continuous     02/04/18 0153        Code Status History    This patient has a current code status but no historical code status.      Home/SNF/Other Home  Chief Complaint abnormal labs  Level of Care/Admitting Diagnosis ED Disposition    ED Disposition Condition Comment   Admit  Hospital Area: [facility] [[id number]]  Level of Care: Telemetry [5]  Admit to tele based on following criteria: Monitor for Ischemic changes  Diagnosis: Symptomatic anemia [2202542]  Admitting Physician: Vianne Bulls [[id number]]  Attending Physician: Vianne Bulls [[id number]]  Estimated length of stay: past midnight tomorrow  Certification:: I certify this patient will need inpatient services for at least 2 midnights  PT Class (Do Not Modify): Inpatient [101]  PT Acc Code (Do Not Modify): Private [1]       Medical History Past Medical History:  Diagnosis Date  . Arthritis   . Asthma   . Hyperlipidemia   . Hypertension   . Migraines   . Prediabetes   . Vertigo   . Vitamin D deficiency     Allergies No Known Allergies  IV Location/Drains/Wounds Patient Lines/Drains/Airways Status   Active Line/Drains/Airways    Name:   Placement date:   Placement time:   Site:   Days:   Peripheral IV 02/03/18 Left Antecubital   02/03/18    1654    Antecubital   1          Labs/Imaging Results for orders placed or performed during the hospital encounter of 02/04/18 (from the past 48 hour(s))  Type and screen     Status: None (Preliminary result)   Collection Time: 02/04/18  1:30 AM  Result Value Ref Range   ABO/RH(D) A POS    Antibody Screen NEG    Sample Expiration 02/07/2018    Unit Number [id number]    Blood Component Type RED CELLS,LR    Unit division 00    Status  of Unit ISSUED    Transfusion Status OK TO TRANSFUSE    Crossmatch Result      Compatible Performed at [facility] [address] [place], [place] [address]    Unit Number [id number]    Blood Component Type RED CELLS,LR    Unit division 00    Status of Unit ISSUED    Transfusion Status OK TO TRANSFUSE    Crossmatch Result Compatible   CBC with Differential/Platelet     Status: Abnormal   Collection Time: 02/04/18  1:30 AM  Result Value Ref Range   WBC 4.9 4.0 - 10.5 K/uL   RBC 2.80 (L) 3.87 - 5.11 MIL/uL   Hemoglobin 5.6 (LL) 12.0 - 15.0 g/dL    Comment: Reticulocyte Hemoglobin testing may be clinically indicated, consider ordering this additional test [id number] THIS CRITICAL RESULT HAS VERIFIED AND BEEN CALLED TO Dorina Hoyer, [place] RN BY KELLY TIBBITTS ON 11 22 2019 AT 0246, AND HAS BEEN READ BACK.     HCT 21.4 (L) 36.0 - 46.0 %   MCV 76.4 (L) 80.0 - 100.0 fL   MCH 20.0 (L) 26.0 - 34.0 pg   MCHC 26.2 (L) 30.0 - 36.0 g/dL  RDW 20.0 (H) 11.5 - 15.5 %   Platelets 263 150 - 400 K/uL   nRBC 0.0 0.0 - 0.2 %   Neutrophils Relative % 59 %   Neutro Abs 2.9 1.7 - 7.7 K/uL   Lymphocytes Relative 22 %   Lymphs Abs 1.1 0.7 - 4.0 K/uL   Monocytes Relative 16 %   Monocytes Absolute 0.8 0.1 - 1.0 K/uL   Eosinophils Relative 1 %   Eosinophils Absolute 0.1 0.0 - 0.5 K/uL   Basophils Relative 1 %   Basophils Absolute 0.0 0.0 - 0.1 K/uL   RBC Morphology ELLIPTOCYTES PRESENT    Immature Granulocytes 1 %   Abs Immature Granulocytes 0.03 0.00 - 0.07 K/uL   Polychromasia PRESENT     Comment: Performed at [facility], [place] [address] [place], [place] [address]  Basic metabolic panel     Status: Abnormal   Collection Time: 02/04/18  1:30 AM  Result Value Ref Range   Sodium 144 135 - 145 mmol/L   Potassium 4.0 3.5 - 5.1 mmol/L   Chloride 112 (H) 98 - 111 mmol/L   CO2 26 22 - 32 mmol/L   Glucose, Bld 110 (H) 70 - 99 mg/dL   BUN 24 (H) 8 - 23 mg/dL    Creatinine, Ser 0.88 0.44 - 1.00 mg/dL   Calcium 8.9 8.9 - 10.3 mg/dL   GFR calc non Af Amer >60 >60 mL/min   GFR calc Af Amer >60 >60 mL/min    Comment: (NOTE) The eGFR has been calculated using the CKD EPI equation. This calculation has not been validated in all clinical situations. eGFR's persistently <60 mL/min signify possible Chronic Kidney Disease.    Anion gap 6 5 - 15    Comment: Performed at [facility], [place] [address], [place], [place] [address]  Prepare RBC     Status: None   Collection Time: 02/04/18  1:30 AM  Result Value Ref Range   Order Confirmation      ORDER PROCESSED BY BLOOD BANK Performed at [facility] [address], [place], [place] [address]   Vitamin B12     Status: Abnormal   Collection Time: 02/04/18  1:30 AM  Result Value Ref Range   Vitamin B-12 1,055 (H) 180 - 914 pg/mL    Comment: (NOTE) This assay is not validated for testing neonatal or myeloproliferative syndrome specimens for Vitamin B12 levels. Performed at [facility], [place] [address], [place], [place] [address]   Folate     Status: None   Collection Time: 02/04/18  1:30 AM  Result Value Ref Range   Folate 12.6 >5.9 ng/mL    Comment: Performed at [facility], [place] [address], [place], [place] [address]  Iron and TIBC     Status: Abnormal   Collection Time: 02/04/18  1:30 AM  Result Value Ref Range   Iron 17 (L) 28 - 170 ug/dL   TIBC 455 (H) 250 - 450 ug/dL   Saturation Ratios 4 (L) 10.4 - 31.8 %   UIBC 438 ug/dL    Comment: Performed at [facility], [place] [address] [place], [place] [address]  Ferritin     Status: Abnormal   Collection Time: 02/04/18  1:30 AM  Result Value Ref Range   Ferritin 2 (L) 11 - 307 ng/mL    Comment: Performed at [facility], [place] [address], [place],  [address]  Reticulocytes  Status: Abnormal   Collection Time: 02/04/18  1:30 AM   Result Value Ref Range   Retic Ct Pct 2.1 0.4 - 3.1 %   RBC. 2.80 (L) 3.87 - 5.11 MIL/uL   Retic Count, Absolute 59.1 19.0 - 186.0 K/uL   Immature Retic Fract 8.7 2.3 - 15.9 %    Comment: Performed at [facility], [place] [address] [place], [place] [address]  Occult blood card to lab, stool RN will collect     Status: None   Collection Time: 02/04/18  7:45 AM  Result Value Ref Range   Fecal Occult Bld NEGATIVE NEGATIVE    Comment: Performed at [facility], [address], [place], [place] [address]  Troponin I - Once     Status: None   Collection Time: 02/04/18 10:00 AM  Result Value Ref Range   Troponin I <0.03 <0.03 ng/mL    Comment: Performed at [facility], [place] [address] [place], [place] [address]  Protime-INR     Status: None   Collection Time: 02/04/18 10:09 AM  Result Value Ref Range   Prothrombin Time 13.1 11.4 - 15.2 seconds   INR 1.00     Comment: Performed at [facility], [place] [address] [place], [place] [address]   Dg Chest 2 View  Result Date: 02/03/2018 CLINICAL DATA:  Pt states 1 month of new sob with any type of walking or activity, she is unaware of any heart disease, hx non smoker, no meds for htn or diabetes EXAM: CHEST - 2 VIEW COMPARISON:  06/30/2017 FINDINGS: The heart is enlarged compared to prior study. There is fullness in the RIGHT hilar and suprahilar regions, raising the question of adenopathy. The lungs are clear. No pulmonary edema. Mild midthoracic spondylosis. Note is made of a hiatal hernia. IMPRESSION: 1. Enlarged cardiopericardial silhouette. Question of pericardial effusion. 2. Fullness in the RIGHT hilar/suprahilar regions, raising the question of adenopathy. Recommend further evaluation with chest CT. Recommend intravenous contrast unless contraindicated. These results will be called to the ordering clinician or representative by the Radiologist Assistant, and  communication documented in the PACS or zVision Dashboard. Electronically Signed   By: Nolon Nations M.D.   On: 02/03/2018 13:25   Ct Chest W Contrast  Result Date: 02/03/2018 CLINICAL DATA:  Patient with dry cough for 3 weeks. Evaluate for possible hilar mass. EXAM: CT CHEST WITH CONTRAST TECHNIQUE: Multidetector CT imaging of the chest was performed during intravenous contrast administration. CONTRAST:  18m OMNIPAQUE IOHEXOL 300 MG/ML  SOLN COMPARISON:  Chest radiograph 02/03/2018 FINDINGS: Cardiovascular: Heart is mildly enlarged. Small pericardial effusion. Aorta and main pulmonary artery normal in caliber. Within the left lower lobe segmental pulmonary artery there is a filling defect (image 79; series 2) most compatible with pulmonary embolus. Mediastinum/Nodes: Moderate-sized hiatal hernia. No axillary, mediastinal or hilar lymphadenopathy. Lungs/Pleura: Central airways are patent. Dependent atelectasis within the bilateral lower lobes. No large area pulmonary consolidation. No pleural effusion or pneumothorax. Upper Abdomen: No acute process. Musculoskeletal: Thoracic spine degenerative changes. No aggressive or acute appearing osseous lesions. IMPRESSION: 1. There is a an acute segmental pulmonary embolus within the left lower lobe pulmonary arteries. 2. No large area of pulmonary consolidation. 3. No mediastinal or hilar mass. Critical Value/emergent results were called by telephone at the time of interpretation on 02/03/2018 at 5:33 pm to [facility], who verbally acknowledged these results. Electronically Signed   By: DLovey NewcomerM.D.   On: 02/03/2018  17:37    Pending Labs Unresulted Labs (From admission, onward)    Start     Ordered   02/04/18 1830  Heparin level (unfractionated)  Once-Timed,   R     02/04/18 1011   02/04/18 1117  Protime-INR  Once,   R     02/04/18 1117   02/04/18 1117  APTT  Once,   R     02/04/18 1117   02/04/18 1009  APTT  ONCE - STAT,   R    Comments:  May  cancel if heparin infusion already started    02/04/18 1011   02/04/18 0857  CEA  Once,   R     02/04/18 0856          Vitals/Pain Today's Vitals   02/04/18 0902 02/04/18 0930 02/04/18 1000 02/04/18 1030  BP: (!) 148/100 (!) 152/97 (!) 170/86 (!) 170/86  Pulse: 77 74 71 73  Resp: 10 16 (!) 22 (!) 24  Temp:      TempSrc:      SpO2: 100% 100% 98% 98%  Weight:      Height:      PainSc:        Isolation Precautions No active isolations  Medications Medications  diazepam (VALIUM) tablet 2.5-5 mg (has no administration in time range)  sodium chloride flush (NS) 0.9 % injection 3 mL (3 mLs Intravenous Given 02/04/18 1031)  sodium chloride flush (NS) 0.9 % injection 3 mL (3 mLs Intravenous Given 02/04/18 1031)  sodium chloride flush (NS) 0.9 % injection 3 mL (has no administration in time range)  0.9 %  sodium chloride infusion (has no administration in time range)  acetaminophen (TYLENOL) tablet 650 mg (has no administration in time range)    Or  acetaminophen (TYLENOL) suppository 650 mg (has no administration in time range)  ondansetron (ZOFRAN) tablet 4 mg (has no administration in time range)    Or  ondansetron (ZOFRAN) injection 4 mg (has no administration in time range)  ferumoxytol (FERAHEME) 510 mg in sodium chloride 0.9 % 100 mL IVPB (has no administration in time range)  heparin ADULT infusion 100 units/mL (25000 units/235m sodium chloride 0.45%) (1,300 Units/hr Intravenous New Bag/Given 02/04/18 1037)  0.9 %  sodium chloride infusion (Manually program via Guardrails IV Fluids) ( Intravenous New Bag/Given 02/04/18 0345)  heparin bolus via infusion 2,000 Units (2,000 Units Intravenous Bolus from Bag 02/04/18 1039)    Mobility walks with person assist

## 2018-02-04 NOTE — Consult Note (Addendum)
Referring Provider: [facility]   Primary Care Physician:  Unk Pinto, MD Primary Gastroenterologist:  Previously Dr. Olevia Perches Reason for Consultation:  Anemia    ASSESSMENT    77. 77 yo female with newly diagnosed left PE, on IV heparin.   2. Recurrent, severe anemia (on NSAIDS). She is iron deficient. First found in April, took iron for a while and hgb rose to 12.1 by mid August. No recent iron and hgb down to 5.6. No overt bleeding, she is heme negative.  -Needs endoscopic evaluation for iron deficiency anemia. The risks and benefits of colonoscopy and EGD were discussed and the patient agrees to proceed. Will prep today for am procedures.  -Placed order for Pharmacy to hold IV heparin at 6 am. Tomorrow morning -Hgb up appropriately to 7.6 post 2 units of blood -She should get either inpatient IV iron or PO iron (upon discharge)    Attending Physician Note   I have taken a history, examined the patient and reviewed the chart. I agree with the Advanced Practitioner's note, impression and recommendations.   Iron deficiency anemia and heme negative stool. New PE on IV heparin. Trend CBC. Recommend IV Fe infusion in hospital and appropriate additional Fe. Colonoscopy and EGD tomorrow for further evaluation.    Lucio Edward, MD [facility] [phone]      HPI: Lori [name] is a 77 y.o. female seen in ED in April with anemia, she left without workup because animals were at home alone. She didn't get GI evaluation but sent home on iron which she took for a month. Hgb in mid August was 12.1.  Patient recently developed worsening of her chronic shortness of breath. PCP sent her for a chest CT scan yesterday which showed a left PE, sent to ED.  In ED she patient was found to have a hgb of 5.6 and she is iron deficient. She denies any overt GI blood loss at home and is heme negative. She has been taking two baby asa daily for a few months and occasionally takes Aleve. BMs are  normal. No unusual weight loss.  No N/V.  No FHM of colon cancer. Her last colonoscopy was > 10  Years ago, I cannot find results  Past Medical History:  Diagnosis Date  . Arthritis   . Asthma   . Hyperlipidemia   . Hypertension   . Migraines   . Prediabetes   . Vertigo   . Vitamin D deficiency     Past Surgical History:  Procedure Laterality Date  . CATARACT EXTRACTION W/ INTRAOCULAR LENS  IMPLANT, BILATERAL  2009  . CEREBRAL ANEURYSM REPAIR    . FOOT SURGERY  1993  . TUBAL LIGATION      Prior to Admission medications   Medication Sig Start Date End Date Taking? Authorizing Provider  aspirin EC 81 MG tablet Take 162 mg by mouth daily.   Yes [provider]  Cholecalciferol (VITAMIN D PO) Take 2,000-4,000 mg by mouth daily.    Yes [provider]  diazepam (VALIUM) 5 MG tablet Take 1/2 to 1 tab tid prn for anxiety or VERTIGO 10/30/17  Yes Unk Pinto, MD  Multiple Vitamins-Minerals (CENTRUM SILVER 50+WOMEN PO) Take 1 tablet by mouth daily.    Yes [provider]  predniSONE (DELTASONE) 10 MG tablet TAKE 2 TABLETS BY MOUTH DAILY WITH BREAKFAST. Patient taking differently: Take 20 mg by mouth daily as needed (leg pain).  09/15/17  Yes Newt Minion, MD  TURMERIC  PO Take 1 tablet by mouth daily.    Yes [provider]    Current Facility-Administered Medications  Medication Dose Route Frequency Provider Last Rate Last Dose  . 0.9 %  sodium chloride infusion (Manually program via Guardrails IV Fluids)   Intravenous Once Regalado, Belkys A, MD      . 0.9 %  sodium chloride infusion  250 mL Intravenous PRN Opyd, Ilene Qua, MD      . acetaminophen (TYLENOL) tablet 650 mg  650 mg Oral Q6H PRN Opyd, Ilene Qua, MD       Or  . acetaminophen (TYLENOL) suppository 650 mg  650 mg Rectal Q6H PRN Opyd, Ilene Qua, MD      . diazepam (VALIUM) tablet 2.5-5 mg  2.5-5 mg Oral TID PRN Opyd, Ilene Qua, MD      . ferumoxytol (FERAHEME) 510 mg in sodium chloride  0.9 % 100 mL IVPB  510 mg Intravenous Once Regalado, Belkys A, MD      . furosemide (LASIX) injection 20 mg  20 mg Intravenous Once Regalado, Belkys A, MD      . heparin ADULT infusion 100 units/mL (25000 units/246mL sodium chloride 0.45%)  1,300 Units/hr Intravenous Continuous Polly Cobia, RPH 13 mL/hr at 02/04/18 1037 1,300 Units/hr at 02/04/18 1037  . heparin bolus via infusion 3,000 Units  3,000 Units Intravenous Once Wofford, Drew A, RPH      . ondansetron (ZOFRAN) tablet 4 mg  4 mg Oral Q6H PRN Opyd, Ilene Qua, MD       Or  . ondansetron (ZOFRAN) injection 4 mg  4 mg Intravenous Q6H PRN Opyd, Ilene Qua, MD      . sodium chloride flush (NS) 0.9 % injection 3 mL  3 mL Intravenous Q12H Opyd, Ilene Qua, MD   3 mL at 02/04/18 1031  . sodium chloride flush (NS) 0.9 % injection 3 mL  3 mL Intravenous Q12H Opyd, Ilene Qua, MD   3 mL at 02/04/18 1031  . sodium chloride flush (NS) 0.9 % injection 3 mL  3 mL Intravenous PRN Opyd, Ilene Qua, MD        Allergies as of 02/04/2018  . (No Known Allergies)    Family History  Problem Relation Age of Onset  . Hypertension Mother   . Diabetes Mother   . Cancer Mother        Pancreatic  . Cancer Father        Skin    Social History   Socioeconomic History  . Marital status: Divorced    Spouse name: Not on file  . Number of children: Not on file  . Years of education: Not on file  . Highest education level: Not on file  Occupational History  . Not on file  Social Needs  . Financial resource strain: Not on file  . Food insecurity:    Worry: Not on file    Inability: Not on file  . Transportation needs:    Medical: Not on file    Non-medical: Not on file  Tobacco Use  . Smoking status: Former Smoker    Last attempt to quit: 08/07/1990    Years since quitting: 27.5  . Smokeless tobacco: Never Used  Substance and Sexual Activity  . Alcohol use: No    Alcohol/week: 0.0 standard drinks  . Drug use: No  . Sexual activity: Not on file   Lifestyle  . Physical activity:    Days per week: Not on file  Minutes per session: Not on file  . Stress: Not on file  Relationships  . Social connections:    Talks on phone: Not on file    Gets together: Not on file    Attends religious service: Not on file    Active member of club or organization: Not on file    Attends meetings of clubs or organizations: Not on file    Relationship status: Not on file  . Intimate partner violence:    Fear of current or ex partner: Not on file    Emotionally abused: Not on file    Physically abused: Not on file    Forced sexual activity: Not on file  Other Topics Concern  . Not on file  Social History Narrative  . Not on file    Review of Systems: All systems reviewed and negative except where noted in HPI.  Physical Exam: Vital signs in last 24 hours: Temp:  [98 F (36.7 C)-98.7 F (37.1 C)] 98 F (36.7 C) (11/22 1205) Pulse Rate:  [67-93] 76 (11/22 1205) Resp:  [10-26] 24 (11/22 1205) BP: (140-179)/(66-100) 179/82 (11/22 1205) SpO2:  [98 %-100 %] 100 % (11/22 1205) Weight:  [81.9 kg-83 kg] 81.9 kg (11/22 1202)   General:   Alert, well-developed, female in NAD Psych:  cooperative. Normal mood and affect. Eyes:  Pupils equal, sclera clear, no icterus.   Conjunctiva pale. Ears:  Normal auditory acuity. Nose:  No deformity, discharge,  or lesions. Neck:  Supple; no masses Lungs:  Clear throughout to auscultation.   No wheezes, crackles, or rhonchi.  Heart:  Regular rate and rhythm, 1+ LLE edema Abdomen:  Soft, non-distended, nontender, BS active, no palp mass    Rectal:  Deferred  Msk:  Symmetrical without gross deformities. . Neurologic:  Alert and  oriented x4;  grossly normal neurologically. Skin:  Intact without significant lesions or rashes.   Intake/Output from previous day: 11/21 0701 - 11/22 0700 In: 945 [Blood:945] Out: -  Intake/Output this shift: Total I/O In: 3 [I.V.:3] Out: -   Lab Results: Recent Labs     02/03/18 1152 02/04/18 0130 02/04/18 1139  WBC 4.8 4.9 4.5  HGB 5.6* 5.6* 7.6*  HCT 20.2* 21.4* 26.2*  PLT 286 263 192   BMET Recent Labs    02/03/18 1152 02/03/18 1659 02/04/18 0130  NA 144  --  144  K 4.4  --  4.0  CL 110  --  112*  CO2 26  --  26  GLUCOSE 102*  --  110*  BUN 20  --  24*  CREATININE 0.91 0.90 0.88  CALCIUM 8.9  --  8.9   LFT Recent Labs    02/03/18 1152  PROT 5.9*  AST 20  ALT 20  BILITOT 0.4   PT/INR Recent Labs    02/04/18 1009 02/04/18 1139  LABPROT SPECIMEN CLOTTED QUESTIONABLE RESULTS, RECOMMEND RECOLLECT TO VERIFY  INR SPECIMEN CLOTTED QUESTIONABLE RESULTS, RECOMMEND RECOLLECT TO VERIFY   Hepatitis Panel No results for input(s): HEPBSAG, HCVAB, HEPAIGM, HEPBIGM in the last 72 hours.    Studies/Results: Dg Chest 2 View  Result Date: 02/03/2018 CLINICAL DATA:  Pt states 1 month of new sob with any type of walking or activity, she is unaware of any heart disease, hx non smoker, no meds for htn or diabetes EXAM: CHEST - 2 VIEW COMPARISON:  06/30/2017 FINDINGS: The heart is enlarged compared to prior study. There is fullness in the RIGHT hilar and suprahilar regions, raising  the question of adenopathy. The lungs are clear. No pulmonary edema. Mild midthoracic spondylosis. Note is made of a hiatal hernia. IMPRESSION: 1. Enlarged cardiopericardial silhouette. Question of pericardial effusion. 2. Fullness in the RIGHT hilar/suprahilar regions, raising the question of adenopathy. Recommend further evaluation with chest CT. Recommend intravenous contrast unless contraindicated. These results will be called to the ordering clinician or representative by the Radiologist Assistant, and communication documented in the PACS or zVision Dashboard. Electronically Signed   By: Nolon Nations M.D.   On: 02/03/2018 13:25   Ct Chest W Contrast  Result Date: 02/03/2018 CLINICAL DATA:  Patient with dry cough for 3 weeks. Evaluate for possible hilar mass.  EXAM: CT CHEST WITH CONTRAST TECHNIQUE: Multidetector CT imaging of the chest was performed during intravenous contrast administration. CONTRAST:  39mL OMNIPAQUE IOHEXOL 300 MG/ML  SOLN COMPARISON:  Chest radiograph 02/03/2018 FINDINGS: Cardiovascular: Heart is mildly enlarged. Small pericardial effusion. Aorta and main pulmonary artery normal in caliber. Within the left lower lobe segmental pulmonary artery there is a filling defect (image 79; series 2) most compatible with pulmonary embolus. Mediastinum/Nodes: Moderate-sized hiatal hernia. No axillary, mediastinal or hilar lymphadenopathy. Lungs/Pleura: Central airways are patent. Dependent atelectasis within the bilateral lower lobes. No large area pulmonary consolidation. No pleural effusion or pneumothorax. Upper Abdomen: No acute process. Musculoskeletal: Thoracic spine degenerative changes. No aggressive or acute appearing osseous lesions. IMPRESSION: 1. There is a an acute segmental pulmonary embolus within the left lower lobe pulmonary arteries. 2. No large area of pulmonary consolidation. 3. No mediastinal or hilar mass. Critical Value/emergent results were called by telephone at the time of interpretation on 02/03/2018 at 5:33 pm to [facility] , who verbally acknowledged these results. Electronically Signed   By: Lovey Newcomer M.D.   On: 02/03/2018 17:37     Tye Savoy, NP-C @  02/04/2018, 1:58 PM

## 2018-02-04 NOTE — ED Notes (Signed)
US is at bedside.

## 2018-02-04 NOTE — Progress Notes (Signed)
Bilateral lower extremity venous duplex completed - Preliminary results - There is no evidence of a DVT. [place] Tonica Brasington,RVS 02/04/2018, 8:57 AM

## 2018-02-04 NOTE — ED Triage Notes (Signed)
Pt states her doctor called her at 2330 to inform her that she has a PE and a hemoglobin of 5.6. Pt reports she was seen for this by her doctor today and has been feeling SOB/dizziness with exertion for over a month

## 2018-02-04 NOTE — ED Notes (Addendum)
Blood administration is still in progress. This RN will draw labs once blood has completed.

## 2018-02-04 NOTE — ED Notes (Signed)
Will transport patient once labs are redrawn.

## 2018-02-04 NOTE — Progress Notes (Signed)
Pharmacy: Re- heparin  Patient's a 77 y.o F with anemia and currently on heparin drip for acute PE.  GI plans to perform EGD/colonoscopy for patient on 11/23 and recommends to hold heparin drip at 0600 on 11/23 prior to procedure.    First heparin level is supra-therapeutic at 0.75  (goal 0.3-0.7). Per RN, no bleeding noted.  Plan: - reduce heparin drip to 1200 units/hr - check 8 hr heparin level - d/c heparin drip at 0600 on 11/23 - monitor for s/s bleeding  Dia Sitter, PharmD, BCPS 02/04/2018 7:14 PM

## 2018-02-04 NOTE — Progress Notes (Addendum)
PROGRESS NOTE    Lori [name]  PTW:[id number] DOB: 1940-08-02 DOA: 02/04/2018 PCP: Unk Pinto, MD    Brief Narrative: 77 year old with past medical history significant for atrial fibrillation, she has refused anticoagulation, vertigo, who presents complaining of shortness of breath on exertion, lightheadedness. Evaluation by patient's PCP revealed hemoglobin at 5 and pulmonary embolism. Patient was admitted for treatment of PE and anemia. She has received 2 units of packed red blood cell. Her hemoglobin has increased to 7.5. She will receive 2 more units of packed red blood cell. She will be started on heparin to treat for PE. GI has been consulted for evaluation of iron deficiency anemia.    Assessment & Plan:   Principal Problem:   Symptomatic anemia Active Problems:   Essential hypertension   Atrial fibrillation ([name])   Acute pulmonary embolism ([name])   Pericardial effusion   1-Anemia, iron deficiency. Symptomatic.  Iron  And ferritin low.  Will give Ferra heme.  GI consulted for further evaluation.  Will get CEA.  She has received 2 units of PRBC. Will give 2 more units.  Monitor on heparin gtt.   2-PE;  Discussed with oncology, start heparin gtt.  Check CEA. Consult GI for anemia.  ECHO and doppler ordered.   3-A fib; not on rate controlled.  4-Hypertension; PRN hydralazine.    RN Pressure Injury Documentation:    Malnutrition Type:      Malnutrition Characteristics:      Nutrition Interventions:     Estimated body mass index is 30.45 kg/m as calculated from the following:   Height as of this encounter: 5\' 5"  (1.651 m).   Weight as of this encounter: 83 kg.   DVT prophylaxis: Heparin  Code Status: full code.  Family Communication: no family at bedside.  Disposition Plan: home when stable. Remain in the hospital for treatment of anemia, and PE>   Consultants:  GI Oncology, phone conversation    Procedures:  ECHO Doppler.     Antimicrobials:  none   Subjective: She is breathing some what better. Not at baseline.  She denies melena, hematochezia.    Objective: Vitals:   02/04/18 0705 02/04/18 0715 02/04/18 0730 02/04/18 0800  BP: (!) 163/70 (!) 169/79 (!) 171/84 (!) 160/78  Pulse: 72 73 70 69  Resp: 19 18 17 17   Temp:  98.5 F (36.9 C)    TempSrc:  Oral    SpO2: 99% 99% 99% 100%  Weight:      Height:        Intake/Output Summary (Last 24 hours) at 02/04/2018 0851 Last data filed at 02/04/2018 7001 Gross per 24 hour  Intake 945 ml  Output -  Net 945 ml   Filed Weights   02/04/18 0046  Weight: 83 kg    Examination:  General exam: Appears calm and comfortable  Respiratory system: crackles right Cardiovascular system: S1 & S2 heard, RRR. No JVD, murmurs, rubs, gallops or clicks. No pedal edema. Gastrointestinal system: Abdomen is nondistended, soft and nontender. No organomegaly or masses felt. Normal bowel sounds heard. Central nervous system: Alert and oriented. No focal neurological deficits. Extremities: Symmetric 5 x 5 power. Skin: No rashes, lesions or ulcers Psychiatry: Judgement and insight appear normal. Mood & affect appropriate.     Data Reviewed: I have personally reviewed following labs and imaging studies  CBC: Recent Labs  Lab 02/03/18 1152 02/04/18 0130  WBC 4.8 4.9  NEUTROABS 3,298 2.9  HGB 5.6* 5.6*  HCT 20.2* 21.4*  MCV 71.9* 76.4*  PLT 286 409   Basic Metabolic Panel: Recent Labs  Lab 02/03/18 1152 02/03/18 1659 02/04/18 0130  NA 144  --  144  K 4.4  --  4.0  CL 110  --  112*  CO2 26  --  26  GLUCOSE 102*  --  110*  BUN 20  --  24*  CREATININE 0.91 0.90 0.88  CALCIUM 8.9  --  8.9   GFR: Estimated Creatinine Clearance: 58.8 mL/min (by C-G formula based on SCr of 0.88 mg/dL). Liver Function Tests: Recent Labs  Lab 02/03/18 1152  AST 20  ALT 20  BILITOT 0.4  PROT 5.9*   No results for input(s): LIPASE, AMYLASE in the last 168  hours. No results for input(s): AMMONIA in the last 168 hours. Coagulation Profile: No results for input(s): INR, PROTIME in the last 168 hours. Cardiac Enzymes: No results for input(s): CKTOTAL, CKMB, CKMBINDEX, TROPONINI in the last 168 hours. BNP (last 3 results) No results for input(s): PROBNP in the last 8760 hours. HbA1C: No results for input(s): HGBA1C in the last 72 hours. CBG: No results for input(s): GLUCAP in the last 168 hours. Lipid Profile: Recent Labs    02/03/18 1152  CHOL 93  HDL 39*  LDLCALC 35  TRIG 102  CHOLHDL 2.4   Thyroid Function Tests: Recent Labs    02/03/18 1152  TSH 3.14   Anemia Panel: Recent Labs    02/04/18 0130  VITAMINB12 1,055*  FOLATE 12.6  FERRITIN 2*  TIBC 455*  IRON 17*  RETICCTPCT 2.1   Sepsis Labs: No results for input(s): PROCALCITON, LATICACIDVEN in the last 168 hours.  No results found for this or any previous visit (from the past 240 hour(s)).       Radiology Studies: Dg Chest 2 View  Result Date: 02/03/2018 CLINICAL DATA:  Pt states 1 month of new sob with any type of walking or activity, she is unaware of any heart disease, hx non smoker, no meds for htn or diabetes EXAM: CHEST - 2 VIEW COMPARISON:  06/30/2017 FINDINGS: The heart is enlarged compared to prior study. There is fullness in the RIGHT hilar and suprahilar regions, raising the question of adenopathy. The lungs are clear. No pulmonary edema. Mild midthoracic spondylosis. Note is made of a hiatal hernia. IMPRESSION: 1. Enlarged cardiopericardial silhouette. Question of pericardial effusion. 2. Fullness in the RIGHT hilar/suprahilar regions, raising the question of adenopathy. Recommend further evaluation with chest CT. Recommend intravenous contrast unless contraindicated. These results will be called to the ordering clinician or representative by the Radiologist Assistant, and communication documented in the PACS or zVision Dashboard. Electronically Signed    By: Nolon Nations M.D.   On: 02/03/2018 13:25   Ct Chest W Contrast  Result Date: 02/03/2018 CLINICAL DATA:  Patient with dry cough for 3 weeks. Evaluate for possible hilar mass. EXAM: CT CHEST WITH CONTRAST TECHNIQUE: Multidetector CT imaging of the chest was performed during intravenous contrast administration. CONTRAST:  40mL OMNIPAQUE IOHEXOL 300 MG/ML  SOLN COMPARISON:  Chest radiograph 02/03/2018 FINDINGS: Cardiovascular: Heart is mildly enlarged. Small pericardial effusion. Aorta and main pulmonary artery normal in caliber. Within the left lower lobe segmental pulmonary artery there is a filling defect (image 79; series 2) most compatible with pulmonary embolus. Mediastinum/Nodes: Moderate-sized hiatal hernia. No axillary, mediastinal or hilar lymphadenopathy. Lungs/Pleura: Central airways are patent. Dependent atelectasis within the bilateral lower lobes. No large area pulmonary consolidation. No pleural effusion or pneumothorax. Upper Abdomen: No  acute process. Musculoskeletal: Thoracic spine degenerative changes. No aggressive or acute appearing osseous lesions. IMPRESSION: 1. There is a an acute segmental pulmonary embolus within the left lower lobe pulmonary arteries. 2. No large area of pulmonary consolidation. 3. No mediastinal or hilar mass. Critical Value/emergent results were called by telephone at the time of interpretation on 02/03/2018 at 5:33 pm to [facility] , who verbally acknowledged these results. Electronically Signed   By: Lovey Newcomer M.D.   On: 02/03/2018 17:37        Scheduled Meds: . sodium chloride flush  3 mL Intravenous Q12H  . sodium chloride flush  3 mL Intravenous Q12H   Continuous Infusions: . sodium chloride       LOS: 0 days    Time spent: 35 minutes.     Elmarie Shiley, MD [facility] Pager [phone]  If 7PM-7AM, please contact night-coverage [email or web address] Password [facility] 02/04/2018, 8:51 AM

## 2018-02-05 ENCOUNTER — Encounter (HOSPITAL_COMMUNITY): Payer: Self-pay | Admitting: Gastroenterology

## 2018-02-05 ENCOUNTER — Inpatient Hospital Stay (HOSPITAL_COMMUNITY): Payer: PPO | Admitting: Anesthesiology

## 2018-02-05 ENCOUNTER — Encounter (HOSPITAL_COMMUNITY)
Admission: EM | Disposition: A | Discharge status: Home / Self Care | Payer: Self-pay | Source: Home / Self Care | Attending: Internal Medicine

## 2018-02-05 DIAGNOSIS — M4800 Spinal stenosis, site unspecified: Secondary | ICD-10-CM

## 2018-02-05 DIAGNOSIS — D509 Iron deficiency anemia, unspecified: Secondary | ICD-10-CM

## 2018-02-05 DIAGNOSIS — K2100 Gastro-esophageal reflux disease with esophagitis, without bleeding: Secondary | ICD-10-CM

## 2018-02-05 DIAGNOSIS — K222 Esophageal obstruction: Secondary | ICD-10-CM

## 2018-02-05 DIAGNOSIS — K449 Diaphragmatic hernia without obstruction or gangrene: Secondary | ICD-10-CM

## 2018-02-05 DIAGNOSIS — G8929 Other chronic pain: Secondary | ICD-10-CM

## 2018-02-05 DIAGNOSIS — D125 Benign neoplasm of sigmoid colon: Secondary | ICD-10-CM

## 2018-02-05 DIAGNOSIS — K640 First degree hemorrhoids: Secondary | ICD-10-CM

## 2018-02-05 DIAGNOSIS — K5730 Diverticulosis of large intestine without perforation or abscess without bleeding: Secondary | ICD-10-CM

## 2018-02-05 HISTORY — PX: POLYPECTOMY: SHX5525

## 2018-02-05 HISTORY — PX: ESOPHAGOGASTRODUODENOSCOPY: SHX5428

## 2018-02-05 HISTORY — PX: BIOPSY: SHX5522

## 2018-02-05 HISTORY — PX: COLONOSCOPY: SHX5424

## 2018-02-05 LAB — BPAM RBC
BLOOD PRODUCT EXPIRATION DATE: 201912142359
BLOOD PRODUCT EXPIRATION DATE: 201912152359
Blood Product Expiration Date: 201912122359
Blood Product Expiration Date: 201912152359
ISSUE DATE / TIME: 201911220333
ISSUE DATE / TIME: 201911220648
ISSUE DATE / TIME: 201911221603
ISSUE DATE / TIME: 201911222013
UNIT TYPE AND RH: 6200
Unit Type and Rh: 6200
Unit Type and Rh: 6200
Unit Type and Rh: 6200

## 2018-02-05 LAB — BASIC METABOLIC PANEL
Anion gap: 8 (ref 5–15)
BUN: 11 mg/dL (ref 8–23)
CO2: 26 mmol/L (ref 22–32)
CREATININE: 0.84 mg/dL (ref 0.44–1.00)
Calcium: 8.7 mg/dL — ABNORMAL LOW (ref 8.9–10.3)
Chloride: 109 mmol/L (ref 98–111)
GFR calc Af Amer: >60 mL/min (ref >60)
GLUCOSE: 99 mg/dL (ref 70–99)
Potassium: 3.6 mmol/L (ref 3.5–5.1)
Sodium: 143 mmol/L (ref 135–145)

## 2018-02-05 LAB — TYPE AND SCREEN
ABO/RH(D): A POS
Antibody Screen: NEGATIVE
UNIT DIVISION: 0
UNIT DIVISION: 0
Unit division: 0
Unit division: 0

## 2018-02-05 LAB — CBC
HCT: 34.6 % — ABNORMAL LOW (ref 36.0–46.0)
Hemoglobin: 10.4 g/dL — ABNORMAL LOW (ref 12.0–15.0)
MCH: 23.8 pg — AB (ref 26.0–34.0)
MCHC: 30.1 g/dL (ref 30.0–36.0)
MCV: 79.2 fL — AB (ref 80.0–100.0)
NRBC: 0 % (ref 0.0–0.2)
PLATELETS: 258 10*3/uL (ref 150–400)
RBC: 4.37 MIL/uL (ref 3.87–5.11)
RDW: 18.6 % — AB (ref 11.5–15.5)
WBC: 5.4 10*3/uL (ref 4.0–10.5)

## 2018-02-05 LAB — HEPARIN LEVEL (UNFRACTIONATED): Heparin Unfractionated: 0.66 IU/mL (ref 0.30–0.70)

## 2018-02-05 LAB — CEA: CEA1: 1.7 ng/mL (ref 0.0–4.7)

## 2018-02-05 SURGERY — COLONOSCOPY
Anesthesia: Monitor Anesthesia Care

## 2018-02-05 MED ORDER — PANTOPRAZOLE SODIUM 40 MG PO TBEC
40.0000 mg | DELAYED_RELEASE_TABLET | Freq: Two times a day (BID) | ORAL | Status: DC
  Administered 2018-02-05 – 2018-02-07 (×5): 40 mg via ORAL
  Filled 2018-02-05 (×5): qty 1

## 2018-02-05 MED ORDER — PROPOFOL 500 MG/50ML IV EMUL
INTRAVENOUS | Status: DC | PRN
  Administered 2018-02-05: 75 ug/kg/min via INTRAVENOUS

## 2018-02-05 MED ORDER — PROPOFOL 10 MG/ML IV BOLUS
INTRAVENOUS | Status: AC
  Filled 2018-02-05: qty 20

## 2018-02-05 MED ORDER — LACTATED RINGERS IV SOLN
INTRAVENOUS | Status: DC | PRN
  Administered 2018-02-05: 11:00:00 via INTRAVENOUS

## 2018-02-05 MED ORDER — HEPARIN (PORCINE) 25000 UT/250ML-% IV SOLN: 1200.0000 [IU]/h | INTRAVENOUS | Status: DC

## 2018-02-05 MED ORDER — PROPOFOL 10 MG/ML IV BOLUS
INTRAVENOUS | Status: DC | PRN
  Administered 2018-02-05: 40 mg via INTRAVENOUS
  Administered 2018-02-05 (×2): 20 mg via INTRAVENOUS

## 2018-02-05 MED ORDER — PHENYLEPHRINE 40 MCG/ML (10ML) SYRINGE FOR IV PUSH (FOR BLOOD PRESSURE SUPPORT)
PREFILLED_SYRINGE | INTRAVENOUS | Status: DC | PRN
  Administered 2018-02-05 (×2): 40 ug via INTRAVENOUS

## 2018-02-05 MED ORDER — LACTATED RINGERS IV SOLN: INTRAVENOUS | Status: DC

## 2018-02-05 MED ORDER — PROPOFOL 10 MG/ML IV BOLUS
INTRAVENOUS | Status: AC
  Filled 2018-02-05: qty 40

## 2018-02-05 NOTE — Progress Notes (Addendum)
Pharmacy: Re- heparin  Patient is a 77 y.o F with anemia and currently on heparin drip for acute PE.  GI plans to perform EGD/colonoscopy for patient on 11/23 and recommends to hold heparin drip at 0600 on 11/23 prior to procedure.    Second heparin level is therapeutic at 0.66 (goal 0.3-0.7). Per RN, no bleeding noted.  Plan: - Continue heparin at 1200 units/hr - d/c heparin drip at 0600 on 11/23 - confirmed plan with RN. - monitor for s/s bleeding  Romeo Rabon, PharmD. Mobile: [phone]. 02/05/2018,3:26 AM.

## 2018-02-05 NOTE — Anesthesia Preprocedure Evaluation (Addendum)
Anesthesia Evaluation  Patient identified by MRN, date of birth, ID band Patient awake    Reviewed: Allergy & Precautions, H&P , NPO status , Patient's Chart, lab work & pertinent test results  Airway Mallampati: III  TM Distance: >3 FB Neck ROM: Full    Dental no notable dental hx. (+) Teeth Intact, Dental Advisory Given   Pulmonary asthma , former smoker,    Pulmonary exam normal breath sounds clear to auscultation       Cardiovascular hypertension,  Rhythm:Regular Rate:Normal     Neuro/Psych  Headaches, negative psych ROS   GI/Hepatic negative GI ROS, Neg liver ROS,   Endo/Other  negative endocrine ROS  Renal/GU negative Renal ROS  negative genitourinary   Musculoskeletal  (+) Arthritis , Osteoarthritis,    Abdominal   Peds  Hematology  (+) Blood dyscrasia, anemia ,   Anesthesia Other Findings   Reproductive/Obstetrics negative OB ROS                            Anesthesia Physical Anesthesia Plan  ASA: II  Anesthesia Plan: MAC   Post-op Pain Management:    Induction: Intravenous  PONV Risk Score and Plan: 2 and Propofol infusion and Treatment may vary due to age or medical condition  Airway Management Planned: Nasal Cannula  Additional Equipment:   Intra-op Plan:   Post-operative Plan:   Informed Consent: I have reviewed the patients History and Physical, chart, labs and discussed the procedure including the risks, benefits and alternatives for the proposed anesthesia with the patient or authorized representative who has indicated his/her understanding and acceptance.   Dental advisory given  Plan Discussed with: CRNA  Anesthesia Plan Comments:         Anesthesia Quick Evaluation

## 2018-02-05 NOTE — Transfer of Care (Signed)
Immediate Anesthesia Transfer of Care Note  Patient: Lori [name]  Procedure(s) Performed: Procedure(s): COLONOSCOPY (N/A) ESOPHAGOGASTRODUODENOSCOPY (EGD) (N/A) BIOPSY POLYPECTOMY  Patient Location: PACU  Anesthesia Type:MAC  Level of Consciousness:  sedated, patient cooperative and responds to stimulation  Airway & Oxygen Therapy:Patient Spontanous Breathing and Patient connected to face mask oxgen  Post-op Assessment:  Report given to PACU RN and Post -op Vital signs reviewed and stable  Post vital signs:  Reviewed and stable  Last Vitals:  Vitals:   02/05/18 1124 02/05/18 1127  BP:  (!) 199/77  Pulse:  75  Resp:  16  Temp: 36.8 C   SpO2:  15%    Complications: No apparent anesthesia complications

## 2018-02-05 NOTE — Consult Note (Signed)
[facility]  [phone] [phone]     ID: Lori [name] DOB: 05-Jun-1940  MR#: [id number]  OZY#:[id number]  Patient Care Team: Unk Pinto, MD as PCP - Internal Medicine (Internal Medicine) Clent Jacks, MD as Consulting Physician (Ophthalmology) Ladene Artist, MD as Consulting Physician (Gastroenterology) Chauncey Cruel, MD OTHER MD:  CHIEF COMPLAINT: Iron deficiency anemia  CURRENT TREATMENT: Transfusions, iron supplementation   HISTORY OF CURRENT ILLNESS: And has a prior history of iron deficiency anemia and has been treated with oral iron supplementation without event in the past.  However she tells me when she ran out of the medication she simply stopped it.  More recently she noted a gradual worsening of shortness of breath which took her to her primary care physician's office.  A chest x-ray on 02/03/2018 suggested possible hilar adenopathy and a possible pericardial effusion, so a CT scan of the chest with contrast was obtained the same day.  This showed an acute segmental pulmonary embolus in the left lower lobe.  There was a small pericardial effusion, no adenopathy.  The patient was admitted, started on heparin, and bilateral lower extremity Dopplers were obtained 02/04/2018 showing no evidence of lower extremity DVT.    Also as part of the patient's work-up a CBC was obtained which showed a hemoglobin of 5.6 with an MCV of 71.9.  The most recent MCV 3 months ago was 93.8 and the hemoglobin then was 12.1.  Also on 02/03/2018 white cell count was 4.8 with an unremarkable differential and platelets were 286,000.  The anemia was further worked up with a B12 level of 1, 0.55, folate 12.6, ferritin 2, and iron saturation 4.  Reticulocytes were 59.1.  Work-up for an occult malignancy was performed with EGD and colonoscopy 02/05/2018.  This showed a benign appearing esophageal stricture, multiple gastric erosions, and a 0.6 cm colonic  polyp with pathology pending.  CEA was normal  We were consulted regarding further management of the iron deficiency anemia  INTERVAL HISTORY: I met with the patient in her hospital room 02/05/2018.  No family was present  REVIEW OF SYSTEMS: The shortness of breath and has been noticing was gradual, without an obvious "catch" pleurisy, hemoptysis, or other major change in her breathing.  It did get to the point that she would have to stop in the middle of normal activities and sit down for a while.  She denies melena or bright red blood per rectum, epistaxis, hemoptysis, hematuria, or unusual bruising.  She admits to frequent use of nonsteroidals particularly naproxen which she takes up to 4 a day for her spinal stenosis symptoms.  A detailed review of systems today was otherwise noncontributory  PAST MEDICAL HISTORY: Past Medical History:  Diagnosis Date  . Arthritis   . Asthma   . Hyperlipidemia   . Hypertension   . Migraines   . Prediabetes   . Vertigo   . Vitamin D deficiency     PAST SURGICAL HISTORY: Past Surgical History:  Procedure Laterality Date  . CATARACT EXTRACTION W/ INTRAOCULAR LENS  IMPLANT, BILATERAL  2009  . CEREBRAL ANEURYSM REPAIR    . FOOT SURGERY  1993  . TUBAL LIGATION      FAMILY HISTORY Family History  Problem Relation Age of Onset  . Hypertension Mother   . Diabetes Mother   . Cancer Mother        Pancreatic  . Cancer Father        Skin  The  patient's father died of melanoma skin cancer at the age of 3.  The patient's mother died from pancreatic cancer at the age of 66.  The patient has 1 brother and 1 sister.  The patient recently lost her daughter-in-law to melanoma and an 6 year old niece to leukemia.  She tells me her granddaughter age 51 has recently been diagnosed with leukemia.  GYNECOLOGIC HISTORY:  No LMP recorded. Patient is postmenopausal. Menarche: 77 years old Age at first live birth: 77 years old GX P 1 LMP 44  SOCIAL  HISTORY:  And used to work for [employer].  She now fosters rescue animals, most recently a goat.  She is divorced and lives by herself.  Her son Wendall Papa lives in [place] where he works for the [employer] and also works in Biomedical engineer.    ADVANCED DIRECTIVES: Not in place.  We will ask the chaplain to facilitate completing the appropriate documents during this admission if possible   HEALTH MAINTENANCE: Social History   Tobacco Use  . Smoking status: Former Smoker    Last attempt to quit: 08/07/1990    Years since quitting: 27.5  . Smokeless tobacco: Never Used  Substance Use Topics  . Alcohol use: No    Alcohol/week: 0.0 standard drinks  . Drug use: No     No Known Allergies  Current Facility-Administered Medications  Medication Dose Route Frequency Provider Last Rate Last Dose  . 0.9 %  sodium chloride infusion  250 mL Intravenous PRN Opyd, Ilene Qua, MD      . acetaminophen (TYLENOL) tablet 650 mg  650 mg Oral Q6H PRN Opyd, Ilene Qua, MD   650 mg at 02/05/18 0606   Or  . acetaminophen (TYLENOL) suppository 650 mg  650 mg Rectal Q6H PRN Opyd, Ilene Qua, MD      . diazepam (VALIUM) tablet 2.5-5 mg  2.5-5 mg Oral TID PRN Opyd, Ilene Qua, MD      . heparin ADULT infusion 100 units/mL (25000 units/226m sodium chloride 0.45%)  1,200 Units/hr Intravenous Continuous SLenis Noon RPH      . hydrALAZINE (APRESOLINE) injection 10 mg  10 mg Intravenous Q6H PRN Regalado, Belkys A, MD   10 mg at 02/05/18 0614  . ondansetron (ZOFRAN) tablet 4 mg  4 mg Oral Q6H PRN Opyd, TIlene Qua MD       Or  . ondansetron (ZOFRAN) injection 4 mg  4 mg Intravenous Q6H PRN Opyd, TIlene Qua MD      . pantoprazole (PROTONIX) EC tablet 40 mg  40 mg Oral BID SLadene Artist MD   40 mg at 02/05/18 1412  . sodium chloride flush (NS) 0.9 % injection 3 mL  3 mL Intravenous Q12H Opyd, TIlene Qua MD   3 mL at 02/04/18 2114  . sodium chloride flush (NS) 0.9 % injection 3 mL  3 mL Intravenous Q12H  Opyd, TIlene Qua MD   3 mL at 02/05/18 0845  . sodium chloride flush (NS) 0.9 % injection 3 mL  3 mL Intravenous PRN Opyd, TIlene Qua MD        OBJECTIVE: Middle-aged white woman who appears older than stated age, examined in bed  Vitals:   02/05/18 1240 02/05/18 1312  BP: (!) 117/46 (!) 159/83  Pulse: 77 74  Resp: 18 (!) 22  Temp:  97.9 F (36.6 C)  SpO2: 93% 100%     Body mass index is 30.05 kg/m.   Wt Readings from Last 3 Encounters:  02/04/18 180 lb 8.9 oz (81.9 kg)  02/03/18 183 lb (83 kg)  10/29/17 179 lb 12.8 oz (81.6 kg)    Ocular: Sclerae unicteric, EOMs intact Lymphatic: No cervical or supraclavicular adenopathy Lungs no rales or rhonchi Heart regular rate and rhythm Abd soft, obese, nontender, positive bowel sounds Neuro: non-focal, well-oriented, appropriate affect  LAB RESULTS:  CMP     Component Value Date/Time   NA 143 02/05/2018 0249   K 3.6 02/05/2018 0249   CL 109 02/05/2018 0249   CO2 26 02/05/2018 0249   GLUCOSE 99 02/05/2018 0249   BUN 11 02/05/2018 0249   CREATININE 0.84 02/05/2018 0249   CREATININE 0.91 02/03/2018 1152   CALCIUM 8.7 (L) 02/05/2018 0249   PROT 5.9 (L) 02/03/2018 1152   ALBUMIN 4.2 10/06/2016 1145   AST 20 02/03/2018 1152   ALT 20 02/03/2018 1152   ALKPHOS 102 10/06/2016 1145   BILITOT 0.4 02/03/2018 1152   GFRNONAA >60 02/05/2018 0249   GFRNONAA 62 02/03/2018 1152   GFRAA >60 02/05/2018 0249   GFRAA 72 02/03/2018 1152    No results found for: TOTALPROTELP, ALBUMINELP, A1GS, A2GS, BETS, BETA2SER, GAMS, MSPIKE, SPEI  No results found for: KPAFRELGTCHN, LAMBDASER, KAPLAMBRATIO  Lab Results  Component Value Date   WBC 5.4 02/05/2018   NEUTROABS 2.9 02/04/2018   HGB 10.4 (L) 02/05/2018   HCT 34.6 (L) 02/05/2018   MCV 79.2 (L) 02/05/2018   PLT 258 02/05/2018    _0 @  No results found for: LABCA2  No components found for: [id number]  Recent Labs  Lab 02/04/18 1351  INR 1.04    No results found  for: LABCA2  No results found for: CAN199  No results found for: NFA213  No results found for: YQM578  No results found for: CA2729  No components found for: HGQUANT  Lab Results  Component Value Date   CEA1 1.7 02/04/2018   /  CEA  Date Value Ref Range Status  02/04/2018 1.7 0.0 - 4.7 ng/mL Final    Comment:    (NOTE)                             Nonsmokers          <3.9                             Smokers             <5.6 Roche [employer]ostics Electrochemiluminescence Immunoassay (ECLIA) Values obtained with different assay methods or kits cannot be used interchangeably.  Results cannot be interpreted as absolute evidence of the presence or absence of malignant disease. Performed At: [facility] [place], [place] [id number] Rush Farmer MD UX:[id number]      No results found for: AFPTUMOR  No results found for: [place]  No results found for: PSA1  Admission on 02/04/2018  Component Date Value Ref Range Status  . ABO/RH(D) 02/04/2018 A POS   Final  . Antibody Screen 02/04/2018 NEG   Final  . Sample Expiration 02/04/2018 02/07/2018   Final  . Unit Number 02/04/2018 [id number]   Final  . Blood Component Type 02/04/2018 RED CELLS,LR   Final  . Unit division 02/04/2018 00   Final  . Status of Unit 02/04/2018 ISSUED,FINAL   Final  . Transfusion Status 02/04/2018 OK TO TRANSFUSE   Final  . Crossmatch Result 02/04/2018  Final                   Value:Compatible Performed at [facility] [address], [place], [place] [address]   . Unit Number 02/04/2018 [id number]   Final  . Blood Component Type 02/04/2018 RED CELLS,LR   Final  . Unit division 02/04/2018 00   Final  . Status of Unit 02/04/2018 ISSUED,FINAL   Final  . Transfusion Status 02/04/2018 OK TO TRANSFUSE   Final  . Crossmatch Result 02/04/2018 Compatible   Final  . Unit Number 02/04/2018 [id number]   Final  . Blood Component Type 02/04/2018 RED  CELLS,LR   Final  . Unit division 02/04/2018 00   Final  . Status of Unit 02/04/2018 ISSUED,FINAL   Final  . Transfusion Status 02/04/2018 OK TO TRANSFUSE   Final  . Crossmatch Result 02/04/2018 Compatible   Final  . Unit Number 02/04/2018 [id number]   Final  . Blood Component Type 02/04/2018 RED CELLS,LR   Final  . Unit division 02/04/2018 00   Final  . Status of Unit 02/04/2018 ISSUED,FINAL   Final  . Transfusion Status 02/04/2018 OK TO TRANSFUSE   Final  . Crossmatch Result 02/04/2018 Compatible   Final  . WBC 02/04/2018 4.9  4.0 - 10.5 K/uL Final  . RBC 02/04/2018 2.80* 3.87 - 5.11 MIL/uL Final  . Hemoglobin 02/04/2018 5.6* 12.0 - 15.0 g/dL Final   Comment: Reticulocyte Hemoglobin testing may be clinically indicated, consider ordering this additional test [id number] THIS CRITICAL RESULT HAS VERIFIED AND BEEN CALLED TO Dorina Hoyer, Zaleski RN BY KELLY TIBBITTS ON 11 22 2019 AT 0246, AND HAS BEEN READ BACK.    Marland Kitchen HCT 02/04/2018 21.4* 36.0 - 46.0 % Final  . MCV 02/04/2018 76.4* 80.0 - 100.0 fL Final  . MCH 02/04/2018 20.0* 26.0 - 34.0 pg Final  . MCHC 02/04/2018 26.2* 30.0 - 36.0 g/dL Final  . RDW 02/04/2018 20.0* 11.5 - 15.5 % Final  . Platelets 02/04/2018 263  150 - 400 K/uL Final  . nRBC 02/04/2018 0.0  0.0 - 0.2 % Final  . Neutrophils Relative % 02/04/2018 59  % Final  . Neutro Abs 02/04/2018 2.9  1.7 - 7.7 K/uL Final  . Lymphocytes Relative 02/04/2018 22  % Final  . Lymphs Abs 02/04/2018 1.1  0.7 - 4.0 K/uL Final  . Monocytes Relative 02/04/2018 16  % Final  . Monocytes Absolute 02/04/2018 0.8  0.1 - 1.0 K/uL Final  . Eosinophils Relative 02/04/2018 1  % Final  . Eosinophils Absolute 02/04/2018 0.1  0.0 - 0.5 K/uL Final  . Basophils Relative 02/04/2018 1  % Final  . Basophils Absolute 02/04/2018 0.0  0.0 - 0.1 K/uL Final  . RBC Morphology 02/04/2018 ELLIPTOCYTES PRESENT   Final  . Immature Granulocytes 02/04/2018 1  % Final  . Abs Immature Granulocytes 02/04/2018 0.03  0.00 -  0.07 K/uL Final  . Polychromasia 02/04/2018 PRESENT   Final   Performed at [facility], [place] [address] [place], [place] [address]  . Sodium 02/04/2018 144  135 - 145 mmol/L Final  . Potassium 02/04/2018 4.0  3.5 - 5.1 mmol/L Final  . Chloride 02/04/2018 112* 98 - 111 mmol/L Final  . CO2 02/04/2018 26  22 - 32 mmol/L Final  . Glucose, Bld 02/04/2018 110* 70 - 99 mg/dL Final  . BUN 02/04/2018 24* 8 - 23 mg/dL Final  . Creatinine, Ser 02/04/2018 0.88  0.44 - 1.00 mg/dL Final  . Calcium  02/04/2018 8.9  8.9 - 10.3 mg/dL Final  . GFR calc non Af Amer 02/04/2018 >60  >60 mL/min Final  . GFR calc Af Amer 02/04/2018 >60  >60 mL/min Final   Comment: (NOTE) The eGFR has been calculated using the CKD EPI equation. This calculation has not been validated in all clinical situations. eGFR's persistently <60 mL/min signify possible Chronic Kidney Disease.   Georgiann Hahn gap 02/04/2018 6  5 - 15 Final   Performed at [facility], [place] [address], [place], [place] [address]  . Order Confirmation 02/04/2018    Final                   Value:ORDER PROCESSED BY BLOOD BANK Performed at [facility], [place] [address] [place], [place] [address]   . Vitamin B-12 02/04/2018 1,055* 180 - 914 pg/mL Final   Comment: (NOTE) This assay is not validated for testing neonatal or myeloproliferative syndrome specimens for Vitamin B12 levels. Performed at [facility], [place] [address] [place], [place] [address]   . Folate 02/04/2018 12.6  >5.9 ng/mL Final   Performed at [facility], [place] [address] [place], [place] [address]  . Iron 02/04/2018 17* 28 - 170 ug/dL Final  . TIBC 02/04/2018 455* 250 - 450 ug/dL Final  . Saturation Ratios 02/04/2018 4* 10.4 - 31.8 % Final  . UIBC 02/04/2018 438  ug/dL Final   Performed at [facility], [place] [address], [place], [place] [address]  . Ferritin 02/04/2018 2* 11 - 307  ng/mL Final   Performed at [place] [address], [place], [place] [address]  . Retic Ct Pct 02/04/2018 2.1  0.4 - 3.1 % Final  . RBC. 02/04/2018 2.80* 3.87 - 5.11 MIL/uL Final  . Retic Count, Absolute 02/04/2018 59.1  19.0 - 186.0 K/uL Final  . Immature Retic Fract 02/04/2018 8.7  2.3 - 15.9 % Final   Performed at [facility], [place] [address] [place], [place] [address]  . Troponin I 02/04/2018 <0.03  <0.03 ng/mL Final   Performed at [facility], [place] [address] [place], [place] [address]  . Weight 02/04/2018 2,888.91  oz Final  . Height 02/04/2018 65  in Final  . BP 02/04/2018 177/85  mmHg Final  . Fecal Occult Bld 02/04/2018 NEGATIVE  NEGATIVE Final   Performed at [facility], [place] [address] [place], [place] [address]  . ISSUE DATE / TIME 02/04/2018 [id number]   Final  . Blood Product Unit Number 02/04/2018 [id number]   Final  . PRODUCT CODE 02/04/2018 [id number]   Final  . Unit Type and Rh 02/04/2018 6200   Final  . Blood Product Expiration Date 02/04/2018 [id number]   Final  . ISSUE DATE / TIME 02/04/2018 [id number]   Final  . Blood Product Unit Number 02/04/2018 [id number]   Final  . PRODUCT CODE 02/04/2018 [id number]   Final  . Unit Type and Rh 02/04/2018 6200   Final  . Blood Product Expiration Date 02/04/2018 [id number]   Final  . ISSUE DATE / TIME 02/04/2018 [id number]   Final  . Blood Product Unit Number 02/04/2018 [id number]   Final  . PRODUCT CODE 02/04/2018 [id number]   Final  . Unit Type and Rh 02/04/2018 6200   Final  . Blood Product Expiration Date 02/04/2018 [id number]   Final  . ISSUE DATE / TIME 02/04/2018 [id number]   Final  . Blood Product  Unit Number 02/04/2018 [id number]   Final  . PRODUCT CODE 02/04/2018 [id number]   Final  . Unit Type and Rh 02/04/2018 6200   Final  . Blood Product Expiration Date 02/04/2018 [id number]   Final  . CEA 02/04/2018 1.7  0.0  - 4.7 ng/mL Final   Comment: (NOTE)                             Nonsmokers          <3.9                             Smokers             <5.6 Roche [employer] Electrochemiluminescence Immunoassay (ECLIA) Values obtained with different assay methods or kits cannot be used interchangeably.  Results cannot be interpreted as absolute evidence of the presence or absence of malignant disease. Performed At: [facility] [place], [place] [id number] Rush Farmer MD SK:[id number]   . aPTT 02/04/2018 SPECIMEN CLOTTED  24 - 36 seconds Final   Comment: REORDERED [place] ED @ 2035 ON 112219 BY MCCOY,N Performed at [facility], [place] [address], [place], [place] [address]   . Prothrombin Time 02/04/2018 SPECIMEN CLOTTED  11.4 - 15.2 seconds Corrected   Comment: REORDERED [id number] PER [place] ED @ 1117 ON 112219 BY MCCOY,N CORRECTED ON 11/22 AT 1121: PREVIOUSLY REPORTED AS 13.1   . INR 02/04/2018 SPECIMEN CLOTTED   Corrected   Comment: REORDERED [place] ED @ [place] ON 112219 BY MCCOY,N Performed at [facility], [place] [address] [place], [place] [address] CORRECTED ON 11/22 AT 1121: PREVIOUSLY REPORTED AS 1.00   . Heparin Unfractionated 02/04/2018 0.75* 0.30 - 0.70 IU/mL Final   Comment: (NOTE) If heparin results are below expected values, and patient dosage has  been confirmed, suggest follow up testing of antithrombin III levels. Performed at [facility], [place] [address], [place], [place] [address]   . Prothrombin Time 02/04/2018 QUESTIONABLE RESULTS, RECOMMEND RECOLLECT TO VERIFY  11.4 - 15.2 seconds Final   REORDERED [id number] PER [place],B @ 1330 ON 112219 BY POTEAT, ,PT RECEIVED BOLUS OF HEPARIN PER RN  . INR 02/04/2018 QUESTIONABLE RESULTS, RECOMMEND RECOLLECT TO VERIFY   Final   Comment: REORDERED [id number] PER [place],B @ 1330 ON 112219 BY POTEAT, ,PT RECEIVED BOLUS OF HEPARIN PER RN Performed at [place] [address] [place], [place] [address]   . aPTT 02/04/2018 QUESTIONABLE RESULTS, RECOMMEND RECOLLECT TO VERIFY  24 - 36 seconds Final   Comment: REORDERED [id number] PER [facility],B @ 1330 ON 112219 BY POTEAT, ,PT RECEIVED BOLUS OF HEPARIN PER RN Performed at [place] [address] [place], [place] [address]   . WBC 02/04/2018 4.5  4.0 - 10.5 K/uL Final  . RBC 02/04/2018 3.36* 3.87 - 5.11 MIL/uL Final  . Hemoglobin 02/04/2018 7.6* 12.0 - 15.0 g/dL Final   Comment: POST TRANSFUSION SPECIMEN DELTA CHECK NOTED   . HCT 02/04/2018 26.2* 36.0 - 46.0 % Final  . MCV 02/04/2018 78.0* 80.0 - 100.0 fL Final  . MCH 02/04/2018 22.6* 26.0 - 34.0 pg Final  . MCHC 02/04/2018 29.0* 30.0 - 36.0 g/dL Final  . RDW 02/04/2018 19.3* 11.5 - 15.5 % Final  . Platelets 02/04/2018 192  150 - 400 K/uL Final  . nRBC 02/04/2018 0.0  0.0 - 0.2 % Final   Performed at [facility], [place] [address], [place], [place] [address]  . Order Confirmation 02/04/2018    Final                   Value:BB SAMPLE OR UNITS ALREADY AVAILABLE Performed at [facility], [place] [address], [place], [place] [address]   . Prothrombin Time 02/04/2018 13.5  11.4 - 15.2 seconds Final  . INR 02/04/2018 1.04   Final   Performed at [facility], [place] [address], [place], [place] [address]  . aPTT 02/04/2018 103* 24 - 36 seconds Final   Comment:        IF BASELINE aPTT IS ELEVATED, SUGGEST PATIENT RISK ASSESSMENT BE USED TO DETERMINE APPROPRIATE ANTICOAGULANT THERAPY. REPEATED TO VERIFY RESULT CHECKED Performed at [place] [address] [place], [place] [address]   . WBC 02/05/2018 5.4  4.0 - 10.5 K/uL Final  . RBC 02/05/2018 4.37  3.87 - 5.11 MIL/uL Final  . Hemoglobin 02/05/2018 10.4* 12.0 - 15.0 g/dL Final   Comment: REPEATED TO VERIFY POST TRANSFUSION SPECIMEN DELTA CHECK NOTED   . HCT 02/05/2018 34.6*  36.0 - 46.0 % Final  . MCV 02/05/2018 79.2* 80.0 - 100.0 fL Final  . MCH 02/05/2018 23.8* 26.0 - 34.0 pg Final  . MCHC 02/05/2018 30.1  30.0 - 36.0 g/dL Final  . RDW 02/05/2018 18.6* 11.5 - 15.5 % Final  . Platelets 02/05/2018 258  150 - 400 K/uL Final  . nRBC 02/05/2018 0.0  0.0 - 0.2 % Final   Performed at [facility], [place] [address], [place], [place] [address]  . Sodium 02/05/2018 143  135 - 145 mmol/L Final  . Potassium 02/05/2018 3.6  3.5 - 5.1 mmol/L Final  . Chloride 02/05/2018 109  98 - 111 mmol/L Final  . CO2 02/05/2018 26  22 - 32 mmol/L Final  . Glucose, Bld 02/05/2018 99  70 - 99 mg/dL Final  . BUN 02/05/2018 11  8 - 23 mg/dL Final  . Creatinine, Ser 02/05/2018 0.84  0.44 - 1.00 mg/dL Final  . Calcium 02/05/2018 8.7* 8.9 - 10.3 mg/dL Final  . GFR calc non Af Amer 02/05/2018 >60  >60 mL/min Final  . GFR calc Af Amer 02/05/2018 >60  >60 mL/min Final   Comment: (NOTE) The eGFR has been calculated using the CKD EPI equation. This calculation has not been validated in all clinical situations. eGFR's persistently <60 mL/min signify possible Chronic Kidney Disease.   Georgiann Hahn gap 02/05/2018 8  5 - 15 Final   Performed at [facility], [place] [address], [place], [place] [address]  . Heparin Unfractionated 02/05/2018 0.66  0.30 - 0.70 IU/mL Final   Comment: (NOTE) If heparin results are below expected values, and patient dosage has  been confirmed, suggest follow up testing of antithrombin III levels. Performed at [facility], [place] [address] [place],  [address]   Hospital Outpatient Visit on 02/03/2018  Component Date Value Ref Range Status  . Creatinine, Ser 02/03/2018 0.90  0.44 - 1.00 mg/dL Final  Office Visit on 02/03/2018  Component Date Value Ref Range Status  . WBC 02/03/2018 4.8  3.8 - 10.8 Thousand/uL Final  . RBC 02/03/2018 2.81* 3.80 - 5.10 Million/uL Final  . Hemoglobin 02/03/2018 5.6* 11.7 - 15.5  g/dL Final   Comment: Verified by repeat analysis. .   . HCT 02/03/2018 20.2*  35.0 - 45.0 % Final  . MCV 02/03/2018 71.9* 80.0 - 100.0 fL Final  . MCH 02/03/2018 19.9* 27.0 - 33.0 pg Final  . MCHC 02/03/2018 27.7* 32.0 - 36.0 g/dL Final  . RDW 02/03/2018 19.2* 11.0 - 15.0 % Final  . Platelets 02/03/2018 286  140 - 400 Thousand/uL Final  . MPV 02/03/2018 12.2  7.5 - 12.5 fL Final  . Neutro Abs 02/03/2018 3,298  1,500 - 7,800 cells/uL Final  . Lymphs Abs 02/03/2018 835* 850 - 3,900 cells/uL Final  . WBC mixed population 02/03/2018 590  200 - 950 cells/uL Final  . Eosinophils Absolute 02/03/2018 29  15 - 500 cells/uL Final  . Basophils Absolute 02/03/2018 48  0 - 200 cells/uL Final  . Neutrophils Relative % 02/03/2018 68.7  % Final  . Total Lymphocyte 02/03/2018 17.4  % Final  . Monocytes Relative 02/03/2018 12.3  % Final  . Eosinophils Relative 02/03/2018 0.6  % Final  . Basophils Relative 02/03/2018 1.0  % Final  . Glucose, Bld 02/03/2018 102* 65 - 99 mg/dL Final   Comment: .            Fasting reference interval . For someone without known diabetes, a glucose value between 100 and 125 mg/dL is consistent with prediabetes and should be confirmed with a follow-up test. .   . BUN 02/03/2018 20  7 - 25 mg/dL Final  . Creat 02/03/2018 0.91  0.60 - 0.93 mg/dL Final   Comment: For patients >42 years of age, the reference limit for Creatinine is approximately 13% higher for people identified as African-American. .   . GFR, Est Non African American 02/03/2018 62  > OR = 60 mL/min/1.40m Final  . GFR, Est African American 02/03/2018 72  > OR = 60 mL/min/1.724mFinal  . BUN/Creatinine Ratio 1177/41/2878OT APPLICABLE  6 - 22 (calc) Final  . Sodium 02/03/2018 144  135 - 146 mmol/L Final  . Potassium 02/03/2018 4.4  3.5 - 5.3 mmol/L Final  . Chloride 02/03/2018 110  98 - 110 mmol/L Final  . CO2 02/03/2018 26  20 - 32 mmol/L Final  . Calcium 02/03/2018 8.9  8.6 - 10.4 mg/dL Final  .  Total Protein 02/03/2018 5.9* 6.1 - 8.1 g/dL Final  . Albumin 02/03/2018 4.1  3.6 - 5.1 g/dL Final  . Globulin 02/03/2018 1.8* 1.9 - 3.7 g/dL (calc) Final  . AG Ratio 02/03/2018 2.3  1.0 - 2.5 (calc) Final  . Total Bilirubin 02/03/2018 0.4  0.2 - 1.2 mg/dL Final  . Alkaline phosphatase (APISO) 02/03/2018 90  33 - 130 U/L Final  . AST 02/03/2018 20  10 - 35 U/L Final  . ALT 02/03/2018 20  6 - 29 U/L Final  . Cholesterol 02/03/2018 93  <200 mg/dL Final  . HDL 02/03/2018 39* >50 mg/dL Final  . Triglycerides 02/03/2018 102  <150 mg/dL Final  . LDL Cholesterol (Calc) 02/03/2018 35  mg/dL (calc) Final   Comment: Reference range: <100 . Desirable range <100 mg/dL for primary prevention;   <70 mg/dL for patients with CHD or diabetic patients  with > or = 2 CHD risk factors. . Marland KitchenDL-C is now calculated using the Martin-Hopkins  calculation, which is a validated novel method providing  better accuracy than the Friedewald equation in the  estimation of LDL-C.  MaCresenciano Genret al. JAAnnamaria [id number];209([id number]  (http://education.[facility].com/faq/FAQ164)   . Total CHOL/HDL Ratio 02/03/2018 2.4  <5.0 (calc) Final  . Non-HDL Cholesterol (Calc) 02/03/2018 54  <  130 mg/dL (calc) Final   Comment: For patients with diabetes plus 1 major ASCVD risk  factor, treating to a non-HDL-C goal of <100 mg/dL  (LDL-C of <70 mg/dL) is considered a therapeutic  option.   Marland Kitchen TSH 02/03/2018 3.14  0.40 - 4.50 mIU/L Final  . D-Dimer, Quant 02/03/2018 1.18* <0.50 mcg/mL FEU Final   Comment: . The D-Dimer test is used frequently to exclude an acute PE or DVT. In patients with a low to moderate clinical risk assessment and a D-Dimer result <0.50 mcg/mL FEU, the likelihood of a PE or DVT is very low. However, a thromboembolic event should not be excluded solely on the basis of the D-Dimer level. Increased levels of D-Dimer are associated with a PE, DVT, DIC, malignancies, inflammation, sepsis, surgery, trauma,  pregnancy, and advancing patient age. [Jama 2006 11:295(2):199-207] . For additional information, please refer to: http://education.[facility].com/faq/FAQ149 (This link is being provided for informational/ educational purposes only) .   Marland Kitchen Troponin I 02/03/2018 0.01  < OR = 0.0 ng/mL Final   Comment: . In accord with published recommendations, serial testing of troponin I at intervals of 2 to 4 hours for up to 12 to 24 hours is suggested in order to corroborate a single troponin I result. An elevated troponin alone is not sufficient to make the diagnosis of MI. . . For additional information, please refer to  http://education.[facility].com/faq/FAQ202  (This link is being provided for informational/ educational purposes only.)   . CBC MORPHOLOGY 02/03/2018   NORMAL Final   Comment: Anisocytosis 1 + Microcytosis 1 + Hypochromasia 1 +     (this displays the last labs from the last 3 days)  No results found for: TOTALPROTELP, ALBUMINELP, A1GS, A2GS, BETS, BETA2SER, GAMS, MSPIKE, SPEI (this displays SPEP labs)  No results found for: KPAFRELGTCHN, LAMBDASER, KAPLAMBRATIO (kappa/lambda light chains)  No results found for: HGBA, HGBA2QUANT, HGBFQUANT, HGBSQUAN (Hemoglobinopathy evaluation)   No results found for: LDH  Lab Results  Component Value Date   IRON 17 (L) 02/04/2018   TIBC 455 (H) 02/04/2018   IRONPCTSAT 4 (L) 02/04/2018   (Iron and TIBC)  Lab Results  Component Value Date   FERRITIN 2 (L) 02/04/2018    Urinalysis    Component Value Date/Time   COLORURINE DARK YELLOW 04/09/2017 1155   APPEARANCEUR TURBID (A) 04/09/2017 1155   LABSPEC 1.036 (H) 04/09/2017 1155   PHURINE < OR = 5.0 04/09/2017 1155   GLUCOSEU TRACE (A) 04/09/2017 1155   HGBUR NEGATIVE 04/09/2017 1155   BILIRUBINUR NEGATIVE 02/11/2016 [place] 04/09/2017 1155   PROTEINUR 1+ (A) 04/09/2017 1155   NITRITE NEGATIVE 04/09/2017 1155   LEUKOCYTESUR 1+ (A)  04/09/2017 1155     STUDIES: Dg Chest 2 View  Result Date: 02/03/2018 CLINICAL DATA:  Pt states 1 month of new sob with any type of walking or activity, she is unaware of any heart disease, hx non smoker, no meds for htn or diabetes EXAM: CHEST - 2 VIEW COMPARISON:  06/30/2017 FINDINGS: The heart is enlarged compared to prior study. There is fullness in the RIGHT hilar and suprahilar regions, raising the question of adenopathy. The lungs are clear. No pulmonary edema. Mild midthoracic spondylosis. Note is made of a hiatal hernia. IMPRESSION: 1. Enlarged cardiopericardial silhouette. Question of pericardial effusion. 2. Fullness in the RIGHT hilar/suprahilar regions, raising the question of adenopathy. Recommend further evaluation with chest CT. Recommend intravenous contrast unless contraindicated. These results will be called to the ordering clinician or representative  by the Radiologist Assistant, and communication documented in the PACS or zVision Dashboard. Electronically Signed   By: Nolon Nations M.D.   On: 02/03/2018 13:25   Ct Chest W Contrast  Result Date: 02/03/2018 CLINICAL DATA:  Patient with dry cough for 3 weeks. Evaluate for possible hilar mass. EXAM: CT CHEST WITH CONTRAST TECHNIQUE: Multidetector CT imaging of the chest was performed during intravenous contrast administration. CONTRAST:  66m OMNIPAQUE IOHEXOL 300 MG/ML  SOLN COMPARISON:  Chest radiograph 02/03/2018 FINDINGS: Cardiovascular: Heart is mildly enlarged. Small pericardial effusion. Aorta and main pulmonary artery normal in caliber. Within the left lower lobe segmental pulmonary artery there is a filling defect (image 79; series 2) most compatible with pulmonary embolus. Mediastinum/Nodes: Moderate-sized hiatal hernia. No axillary, mediastinal or hilar lymphadenopathy. Lungs/Pleura: Central airways are patent. Dependent atelectasis within the bilateral lower lobes. No large area pulmonary consolidation. No pleural  effusion or pneumothorax. Upper Abdomen: No acute process. Musculoskeletal: Thoracic spine degenerative changes. No aggressive or acute appearing osseous lesions. IMPRESSION: 1. There is a an acute segmental pulmonary embolus within the left lower lobe pulmonary arteries. 2. No large area of pulmonary consolidation. 3. No mediastinal or hilar mass. Critical Value/emergent results were called by telephone at the time of interpretation on 02/03/2018 at 5:33 pm to [facility], who verbally acknowledged these results. Electronically Signed   By: DLovey NewcomerM.D.   On: 02/03/2018 17:37   BLOOD FILM: Multiple stomatocytes, and pale red cells, but no schistocytes and no tailed poikilocytes.  There was minimal rouleaux.  The white cell series and platelets appeared normal  ASSESSMENT: 77y.o. [place].  woman admitted with a LLL pulmonary embolus and found to have a Hb of 5.6 with unremarkable WBC and platelets;  MCV 71.9 (previous 93.8), ferritin 2  (1) EGD/ colonoscopy 02/05/2018 shows esophagitis, hiatal hernia, multiple gastric erosions, and a 0.6 cm colonic polyp  (a) CEA on 02/04/2018 was 1.7  (2) spinal stenosis with chronic back pain, on NSAIDS for pain control  (3) iron deficiency anemia: Likely related to NSAIDS use for spinal stenosis symptoms--no evidence of cancer by labs, endoscopy or CT of the chest  PLAN: We reviewed the fact that the body needs iron to make red cells, and that the way we lose iron is by bleeding.  She has a history of iron deficiency and with the recent work-up as just noted it appears the main reason is gastric erosions secondary to nonsteroidal use.  There are obviously 2 approaches to this problem.  One is to stop using nonsteroidals.  The best way to accomplish that would be to have less chronic pain and it might be worth it for the patient to be referred to [facility] of epid[facility] treatments.  If the  pain cannot be improved then she can take Tylenol or consider tramadol for pain control.  Neither of these will cause erosions.  The tramadol of course can cause constipation and in some patients dependence  The other approach is to supplement iron.  She did receive a Feraheme infusion today, 02/05/2018.  She tells me she had no problems taking iron by mouth but when she ran out of pills she simply stopped.  I recommended she continue to take iron at least once a day for at least 1 year  I will be happy to see the patient on an outpatient basis as needed but as of now, with follow-up every 3 months at  Dr. Idell Pickles office, I do not believe outpatient hematology follow-up is needed  Please let me know if I can be of further help  Chauncey Cruel, MD   02/05/2018 2:53 PM Medical Oncology and Hematology [facility] [address] [place], [place] [address] Tel. [phone]    Fax. [phone]

## 2018-02-05 NOTE — Interval H&P Note (Signed)
History and Physical Interval Note:  02/05/2018 11:17 AM  Lori [name]  has presented today for surgery, with the diagnosis of iron deficiency anemia  The various methods of treatment have been discussed with the patient and family. After consideration of risks, benefits and other options for treatment, the patient has consented to  Procedure(s): COLONOSCOPY (N/A) ESOPHAGOGASTRODUODENOSCOPY (EGD) (N/A) as a surgical intervention .  The patient's history has been reviewed, patient examined, no change in status, stable for surgery.  I have reviewed the patient's chart and labs.  Questions were answered to the patient's satisfaction.     Pricilla Riffle. Fuller Plan

## 2018-02-05 NOTE — Progress Notes (Addendum)
PROGRESS NOTE    Lori [name]  XHB:[id number] DOB: 1940-07-20 DOA: 02/04/2018 PCP: Unk Pinto, MD    Brief Narrative: 77 year old with past medical history significant for atrial fibrillation, she has refused anticoagulation, vertigo, who presents complaining of shortness of breath on exertion, lightheadedness. Evaluation by patient's PCP revealed hemoglobin at 5 and pulmonary embolism. Patient was admitted for treatment of PE and anemia. She has received 2 units of packed red blood cell. Her hemoglobin has increased to 7.5. She will receive 2 more units of packed red blood cell. She will be started on heparin to treat for PE. GI has been consulted for evaluation of iron deficiency anemia.    Assessment & Plan:   Principal Problem:   Symptomatic anemia Active Problems:   Essential hypertension   Atrial fibrillation ([name])   Acute pulmonary embolism ([name])   Pericardial effusion   1-Anemia, iron deficiency. Symptomatic. Secondary to esophagitis and cameron erosion.  Iron  And ferritin low.  Received  Ferra heme.  GI consulted for further evaluation.  CEA. Normal.  She has received 4 units of PRBC.  Monitor on heparin gtt.  Underwent endoscopy,; showed esophagitis, mild esophageal stenosis,  and erosive gastropathy associate with hiatal hernia, cameron erosion.  Colonoscopy; polyp 6 mm removed.  Continue with PPI.    2-PE;  Discussed with oncology.  Continue with Heparin Gtt today, change to Eliquis tomorrow due to biopsy perform today.  CEA negative. Consult GI for anemia.  ECHO normal RV function. Diastolic dysfunction.  Doppler negative.   3-A fib; not on rate controlled.  4-Hypertension; PRN hydralazine.    RN Pressure Injury Documentation:    Malnutrition Type:      Malnutrition Characteristics:      Nutrition Interventions:     Estimated body mass index is 30.05 kg/m as calculated from the following:   Height as of this encounter: 5\' 5"  (1.651  m).   Weight as of this encounter: 81.9 kg.   DVT prophylaxis: Heparin  Code Status: full code.  Family Communication: no family at bedside.  Disposition Plan: home when stable. Remain in the hospital for treatment of anemia, and PE>   Consultants:  GI Oncology, phone conversation    Procedures:  ECHO Doppler.    Antimicrobials:  none   Subjective: She is feeling ok, denies worsening breathing.     Objective: Vitals:   02/04/18 2005 02/04/18 2035 02/04/18 2340 02/05/18 0602  BP: (!) 150/67 (!) 150/77 (!) 155/77 (!) 165/82  Pulse: 83 91 74 71  Resp: 18 18 18 18   Temp: 97.8 F (36.6 C) 98.5 F (36.9 C) 98.3 F (36.8 C) 98.7 F (37.1 C)  TempSrc: Oral Oral Oral Oral  SpO2: 99% 99% 98% 98%  Weight:      Height:        Intake/Output Summary (Last 24 hours) at 02/05/2018 1020 Last data filed at 02/05/2018 0659 Gross per 24 hour  Intake 1738.84 ml  Output -  Net 1738.84 ml   Filed Weights   02/04/18 0046 02/04/18 1202  Weight: 83 kg 81.9 kg    Examination:  General exam: NAD Respiratory system: Crackles right side.  Cardiovascular system: S 1, S 2 RRR Gastrointestinal system: BS present, soft, nt Central nervous system: Non focal.  Extremities: Symmetric power.  Skin: no rashes.    Data Reviewed: I have personally reviewed following labs and imaging studies  CBC: Recent Labs  Lab 02/03/18 1152 02/04/18 0130 02/04/18 1139 02/05/18 0249  WBC 4.8  4.9 4.5 5.4  NEUTROABS 3,298 2.9  --   --   HGB 5.6* 5.6* 7.6* 10.4*  HCT 20.2* 21.4* 26.2* 34.6*  MCV 71.9* 76.4* 78.0* 79.2*  PLT 286 263 192 440   Basic Metabolic Panel: Recent Labs  Lab 02/03/18 1152 02/03/18 1659 02/04/18 0130 02/05/18 0249  NA 144  --  144 143  K 4.4  --  4.0 3.6  CL 110  --  112* 109  CO2 26  --  26 26  GLUCOSE 102*  --  110* 99  BUN 20  --  24* 11  CREATININE 0.91 0.90 0.88 0.84  CALCIUM 8.9  --  8.9 8.7*   GFR: Estimated Creatinine Clearance: 61.2 mL/min  (by C-G formula based on SCr of 0.84 mg/dL). Liver Function Tests: Recent Labs  Lab 02/03/18 1152  AST 20  ALT 20  BILITOT 0.4  PROT 5.9*   No results for input(s): LIPASE, AMYLASE in the last 168 hours. No results for input(s): AMMONIA in the last 168 hours. Coagulation Profile: Recent Labs  Lab 02/04/18 1009 02/04/18 1139 02/04/18 1351  INR SPECIMEN CLOTTED QUESTIONABLE RESULTS, RECOMMEND RECOLLECT TO VERIFY 1.04   Cardiac Enzymes: Recent Labs  Lab 02/03/18 1152 02/04/18 1000  TROPONINI 0.01 <0.03   BNP (last 3 results) No results for input(s): PROBNP in the last 8760 hours. HbA1C: No results for input(s): HGBA1C in the last 72 hours. CBG: No results for input(s): GLUCAP in the last 168 hours. Lipid Profile: Recent Labs    02/03/18 1152  CHOL 93  HDL 39*  LDLCALC 35  TRIG 102  CHOLHDL 2.4   Thyroid Function Tests: Recent Labs    02/03/18 1152  TSH 3.14   Anemia Panel: Recent Labs    02/04/18 0130  VITAMINB12 1,055*  FOLATE 12.6  FERRITIN 2*  TIBC 455*  IRON 17*  RETICCTPCT 2.1   Sepsis Labs: No results for input(s): PROCALCITON, LATICACIDVEN in the last 168 hours.  No results found for this or any previous visit (from the past 240 hour(s)).       Radiology Studies: Dg Chest 2 View  Result Date: 02/03/2018 CLINICAL DATA:  Pt states 1 month of new sob with any type of walking or activity, she is unaware of any heart disease, hx non smoker, no meds for htn or diabetes EXAM: CHEST - 2 VIEW COMPARISON:  06/30/2017 FINDINGS: The heart is enlarged compared to prior study. There is fullness in the RIGHT hilar and suprahilar regions, raising the question of adenopathy. The lungs are clear. No pulmonary edema. Mild midthoracic spondylosis. Note is made of a hiatal hernia. IMPRESSION: 1. Enlarged cardiopericardial silhouette. Question of pericardial effusion. 2. Fullness in the RIGHT hilar/suprahilar regions, raising the question of adenopathy.  Recommend further evaluation with chest CT. Recommend intravenous contrast unless contraindicated. These results will be called to the ordering clinician or representative by the Radiologist Assistant, and communication documented in the PACS or zVision Dashboard. Electronically Signed   By: Nolon Nations M.D.   On: 02/03/2018 13:25   Ct Chest W Contrast  Result Date: 02/03/2018 CLINICAL DATA:  Patient with dry cough for 3 weeks. Evaluate for possible hilar mass. EXAM: CT CHEST WITH CONTRAST TECHNIQUE: Multidetector CT imaging of the chest was performed during intravenous contrast administration. CONTRAST:  23mL OMNIPAQUE IOHEXOL 300 MG/ML  SOLN COMPARISON:  Chest radiograph 02/03/2018 FINDINGS: Cardiovascular: Heart is mildly enlarged. Small pericardial effusion. Aorta and main pulmonary artery normal in caliber. Within the left lower  lobe segmental pulmonary artery there is a filling defect (image 79; series 2) most compatible with pulmonary embolus. Mediastinum/Nodes: Moderate-sized hiatal hernia. No axillary, mediastinal or hilar lymphadenopathy. Lungs/Pleura: Central airways are patent. Dependent atelectasis within the bilateral lower lobes. No large area pulmonary consolidation. No pleural effusion or pneumothorax. Upper Abdomen: No acute process. Musculoskeletal: Thoracic spine degenerative changes. No aggressive or acute appearing osseous lesions. IMPRESSION: 1. There is a an acute segmental pulmonary embolus within the left lower lobe pulmonary arteries. 2. No large area of pulmonary consolidation. 3. No mediastinal or hilar mass. Critical Value/emergent results were called by telephone at the time of interpretation on 02/03/2018 at 5:33 pm to [facility] , who verbally acknowledged these results. Electronically Signed   By: Lovey Newcomer M.D.   On: 02/03/2018 17:37        Scheduled Meds: . sodium chloride flush  3 mL Intravenous Q12H  . sodium chloride flush  3 mL Intravenous Q12H    Continuous Infusions: . sodium chloride       LOS: 1 day    Time spent: 35 minutes.     Elmarie Shiley, MD [facility] Pager [phone]  If 7PM-7AM, please contact night-coverage [email or web address] Password [email or web address] 02/05/2018, 10:20 AM

## 2018-02-05 NOTE — Op Note (Signed)
[facility] Patient Name: Lori [name] Procedure Date: 02/05/2018 MRN: [id number] Attending MD: Ladene Artist , MD Date of Birth: November 27, 1940 CSN: [id number] Age: 77 Admit Type: Inpatient Procedure:                Colonoscopy Indications:              Iron deficiency anemia Providers:                Pricilla Riffle. Fuller Plan, MD, Cleda Daub, RN, William Dalton, Technician Referring MD:             [facility] Medicines:                Monitored Anesthesia Care Complications:            No immediate complications. Estimated blood loss:                            None. Estimated Blood Loss:     Estimated blood loss: none. Procedure:                Pre-Anesthesia Assessment:                           - Prior to the procedure, a History and Physical                            was performed, and patient medications and                            allergies were reviewed. The patient's tolerance of                            previous anesthesia was also reviewed. The risks                            and benefits of the procedure and the sedation                            options and risks were discussed with the patient.                            All questions were answered, and informed consent                            was obtained. Prior Anticoagulants: The patient has                            taken heparin, last dose was day of procedure. ASA                            Grade Assessment: III - A patient with severe  systemic disease. After reviewing the risks and                            benefits, the patient was deemed in satisfactory                            condition to undergo the procedure.                           After obtaining informed consent, the colonoscope                            was passed under direct vision. Throughout the                            procedure, the patient's blood pressure,  pulse, and                            oxygen saturations were monitored continuously. The                            [id number]-[id number] ([id number]) Olympus peds colonoscope was                            introduced through the anus and advanced to the the                            cecum, identified by appendiceal orifice and                            ileocecal valve. The ileocecal valve, appendiceal                            orifice, and rectum were photographed. The quality                            of the bowel preparation was good. The colonoscopy                            was performed without difficulty. The patient                            tolerated the procedure well. Scope In: 11:45:09 AM Scope Out: 12:04:25 PM Scope Withdrawal Time: 0 hours 14 minutes 3 seconds  Total Procedure Duration: 0 hours 19 minutes 16 seconds  Findings:      The perianal and digital rectal examinations were normal.      A 6 mm polyp was found in the sigmoid colon. The polyp was sessile. The       polyp was removed with a cold snare. Resection and retrieval were       complete.      Multiple small-mouthed diverticula were found in the left colon. There       was no evidence of diverticular bleeding.      Internal hemorrhoids were found during retroflexion. The  hemorrhoids       were small and Grade I (internal hemorrhoids that do not prolapse).      The exam was otherwise without abnormality on direct and retroflexion       views. Impression:               - One 6 mm polyp in the sigmoid colon, removed with                            a cold snare. Resected and retrieved.                           - Moderate diverticulosis in the left colon. There                            was no evidence of diverticular bleeding.                           - Internal hemorrhoids.                           - The examination was otherwise normal on direct                            and retroflexion views. Moderate  Sedation:      Not Applicable - Patient had care per Anesthesia. Recommendation:           - Resume heparin today at prior dose. Refer to                            managing physician for further adjustment of                            therapy.                           - Patient has a contact number available for                            emergencies. The signs and symptoms of potential                            delayed complications were discussed with the                            patient. Return to normal activities tomorrow.                            Written discharge instructions were provided to the                            patient.                           - High fiber diet.                           -  Continue present medications.                           - Await pathology results.                           - No repeat colonoscopy due to age.                           - Proceed with EGD today. Procedure Code(s):        --- Professional ---                           [phone], Colonoscopy, flexible; with removal of                            tumor(s), polyp(s), or other lesion(s) by snare                            technique Diagnosis Code(s):        --- Professional ---                           D12.5, Benign neoplasm of sigmoid colon                           K64.0, First degree hemorrhoids                           D50.9, Iron deficiency anemia, unspecified                           K57.30, Diverticulosis of large intestine without                            perforation or abscess without bleeding CPT copyright 2018 [employer]. All rights reserved. The codes documented in this report are preliminary and upon coder review may  be revised to meet current compliance requirements. Ladene Artist, MD 02/05/2018 12:09:00 PM This report has been signed electronically. Number of Addenda: 0

## 2018-02-05 NOTE — Progress Notes (Signed)
ANTICOAGULATION CONSULT NOTE  Pharmacy Consult for IV heparin Indication: pulmonary embolus  No Known Allergies  Patient Measurements: Height: 5\' 5"  (165.1 cm) Weight: 180 lb 8.9 oz (81.9 kg) IBW/kg (Calculated) : 57 Heparin Dosing Weight: 74 kg  Vital Signs: Temp: 97.9 F (36.6 C) (11/23 1312) Temp Source: Oral (11/23 1312) BP: 159/83 (11/23 1312) Pulse Rate: 74 (11/23 1312)  Labs: Recent Labs    02/03/18 1152 02/03/18 1659 02/04/18 0130 02/04/18 1000 02/04/18 1009 02/04/18 1139 02/04/18 1351 02/04/18 1809 02/05/18 0249  HGB 5.6*  --  5.6*  --   --  7.6*  --   --  10.4*  HCT 20.2*  --  21.4*  --   --  26.2*  --   --  34.6*  PLT 286  --  263  --   --  192  --   --  258  APTT  --   --   --   --  SPECIMEN CLOTTED QUESTIONABLE RESULTS, RECOMMEND RECOLLECT TO VERIFY 103*  --   --   LABPROT  --   --   --   --  SPECIMEN CLOTTED QUESTIONABLE RESULTS, RECOMMEND RECOLLECT TO VERIFY 13.5  --   --   INR  --   --   --   --  SPECIMEN CLOTTED QUESTIONABLE RESULTS, RECOMMEND RECOLLECT TO VERIFY 1.04  --   --   HEPARINUNFRC  --   --   --   --   --   --   --  0.75* 0.66  CREATININE 0.91 0.90 0.88  --   --   --   --   --  0.84  TROPONINI 0.01  --   --  <0.03  --   --   --   --   --     Estimated Creatinine Clearance: 61.2 mL/min (by C-G formula based on SCr of 0.84 mg/dL).   Medical History: Past Medical History:  Diagnosis Date  . Arthritis   . Asthma   . Hyperlipidemia   . Hypertension   . Migraines   . Prediabetes   . Vertigo   . Vitamin D deficiency     Medications:  Medications Prior to Admission  Medication Sig Dispense Refill Last Dose  . aspirin EC 81 MG tablet Take 162 mg by mouth daily.   02/03/2018 at Unknown time  . Cholecalciferol (VITAMIN D PO) Take 2,000-4,000 mg by mouth daily.    Past Week at Unknown time  . diazepam (VALIUM) 5 MG tablet Take 1/2 to 1 tab tid prn for anxiety or VERTIGO 90 tablet 1 Past Month at Unknown time  . Multiple Vitamins-Minerals  (CENTRUM SILVER 50+WOMEN PO) Take 1 tablet by mouth daily.    Past Week at Unknown time  . predniSONE (DELTASONE) 10 MG tablet TAKE 2 TABLETS BY MOUTH DAILY WITH BREAKFAST. (Patient taking differently: Take 20 mg by mouth daily as needed (leg pain). ) 60 tablet 0 Past Month at Unknown time  . TURMERIC PO Take 1 tablet by mouth daily.    Past Week at Unknown time   Scheduled:  . pantoprazole  40 mg Oral BID  . sodium chloride flush  3 mL Intravenous Q12H  . sodium chloride flush  3 mL Intravenous Q12H   PRN: sodium chloride, acetaminophen **OR** acetaminophen, diazepam, hydrALAZINE, ondansetron **OR** ondansetron (ZOFRAN) IV, sodium chloride flush  Assessment: 32 yoF with PMH HTN, HLD, vertigo, Afib not on anticoag d/t patient refusal presents 11/21 with exertional dyspnea,  lightheadedness. PCP visit revealed severe anemia with Hgb 5.6 and acute PE. Admitted for management; Pharmacy to dose IV heparin.   Baseline INR, aPTT: pending  Prior anticoagulation: none  Significant events:  11/22: 2nd unit of blood still needs to finish infusing prior to CBC recheck, but MD would like to start heparin now. Transfused 2 units PRBC.  11/22: FOBT negative  11/23: upper endoscopy and colonoscopy performed  Today, 02/05/2018:  CBC: Hgb 10.4 increased after PRBC x 2  Plt 258 WNL - stable  SCr 0.8, CrCl ~61 mL/min  Per MD order, heparin infusion was turned off at 0600 this morning prior to procedure. Pt underwent colonoscopy and upper endoscopy. Order received to resume heparin drip at 1700 this evening.   Goal of Therapy: Heparin level 0.3-0.7 units/ml Monitor platelets by anticoagulation protocol: Yes  Plan:  Resume heparin infusion at 1200 units/hr @ 1700 this evening. Pt had therapeutic HL of 0.66 on this rate early this morning.  Check heparin level 8 hrs after start  Daily CBC, daily heparin level once stable  Monitor for signs of bleeding or thrombosis  Lenis Noon,  PharmD 02/05/18 2:39 PM

## 2018-02-05 NOTE — Op Note (Signed)
[facility] Patient Name: Lori [name] Procedure Date: 02/05/2018 MRN: [id number] Attending MD: Ladene Artist , MD Date of Birth: 1940/10/24 CSN: [id number] Age: 77 Admit Type: Inpatient Procedure:                Upper GI endoscopy Indications:              Iron deficiency anemia Providers:                Pricilla Riffle. Fuller Plan, MD, Cleda Daub, RN, William Dalton, Technician Referring MD:             [facility] Medicines:                Monitored Anesthesia Care Complications:            No immediate complications. Estimated Blood Loss:     Estimated blood loss was minimal. Procedure:                Pre-Anesthesia Assessment:                           - Prior to the procedure, a History and Physical                            was performed, and patient medications and                            allergies were reviewed. The patient's tolerance of                            previous anesthesia was also reviewed. The risks                            and benefits of the procedure and the sedation                            options and risks were discussed with the patient.                            All questions were answered, and informed consent                            was obtained. Prior Anticoagulants: The patient has                            taken heparin, last dose was day of procedure. ASA                            Grade Assessment: III - A patient with severe                            systemic disease. After reviewing the risks and  benefits, the patient was deemed in satisfactory                            condition to undergo the procedure.                           - Prior to the procedure, a History and Physical                            was performed, and patient medications and                            allergies were reviewed. The patient's tolerance of                            previous  anesthesia was also reviewed. The risks                            and benefits of the procedure and the sedation                            options and risks were discussed with the patient.                            All questions were answered, and informed consent                            was obtained. Prior Anticoagulants: The patient has                            taken heparin, last dose was day of procedure. ASA                            Grade Assessment: III - A patient with severe                            systemic disease. After reviewing the risks and                            benefits, the patient was deemed in satisfactory                            condition to undergo the procedure.                           After obtaining informed consent, the endoscope was                            passed under direct vision. Throughout the                            procedure, the patient's blood pressure, pulse, and  oxygen saturations were monitored continuously. The                            [id number]-H190 ([id number] Olympus adult endoscope was                            introduced through the mouth, and advanced to the                            second part of duodenum. The upper GI endoscopy was                            accomplished without difficulty. The patient                            tolerated the procedure well. Scope In: Scope Out: Findings:      LA Grade C (one or more mucosal breaks continuous between tops of 2 or       more mucosal folds, less than 75% circumference) esophagitis with no       bleeding was found in the distal esophagus. Biopsies were taken with a       cold forceps for histology.      One benign-appearing, intrinsic mild stenosis was found 33 cm from the       incisors. This stenosis measured 1.4 cm (inner diameter). The stenosis       was traversed.      The exam of the esophagus was otherwise normal.      A medium-sized  hiatal hernia was present.      Multiple localized, medium, linear non-bleeding erosions were found in       the gastric fundus associated with the hiatal hernia. There were no       stigmata of recent bleeding.      The exam of the stomach was otherwise normal.      The duodenal bulb and second portion of the duodenum were normal. Impression:               - LA Grade C reflux esophagitis. Biopsied.                           - Benign-appearing esophageal stenosis.                           - Medium-sized hiatal hernia.                           - Non-bleeding erosive gastropathy associate with                            the hiatal hernia, Cameron erosions.                           - Normal duodenal bulb and second portion of the                            duodenum. Moderate Sedation:      Not Applicable - Patient had care per Anesthesia.  Recommendation:           - Return patient to hospital ward for ongoing care.                           - Resume previous diet.                           - Continue present medications.                           - Resume heparin at prior dose today. Refer to                            managing physician for further adjustment of                            therapy.                           - Await pathology results.                           - Cameron erosions leading to chronic blood loss                            explains iron deficiency. She could also have                            chronic blood loss from esophagitis. Anticipate                            chronic blood loss and need for repeat iron                            replacement from Cameron erosions.                           - Protonix (pantoprazole) 40 mg PO BID for 1 month                            then 40 mg po qam long term. Procedure Code(s):        --- Professional ---                           [phone], Esophagogastroduodenoscopy, flexible,                            transoral;  with biopsy, single or multiple Diagnosis Code(s):        --- Professional ---                           K21.0, Gastro-esophageal reflux disease with                            esophagitis  K22.2, Esophageal obstruction                           K44.9, Diaphragmatic hernia without obstruction or                            gangrene                           K31.89, Other diseases of stomach and duodenum                           D50.9, Iron deficiency anemia, unspecified CPT copyright 2018 [employer]. All rights reserved. The codes documented in this report are preliminary and upon coder review may  be revised to meet current compliance requirements. Ladene Artist, MD 02/05/2018 12:29:48 PM This report has been signed electronically. Number of Addenda: 0

## 2018-02-05 NOTE — Anesthesia Postprocedure Evaluation (Signed)
Anesthesia Post Note  Patient: Lori [name]  Procedure(s) Performed: COLONOSCOPY (N/A ) ESOPHAGOGASTRODUODENOSCOPY (EGD) (N/A ) BIOPSY POLYPECTOMY     Patient location during evaluation: PACU Anesthesia Type: MAC Level of consciousness: awake and alert Pain management: pain level controlled Vital Signs Assessment: post-procedure vital signs reviewed and stable Respiratory status: spontaneous breathing, nonlabored ventilation, respiratory function stable and patient connected to nasal cannula oxygen Cardiovascular status: stable and blood pressure returned to baseline Postop Assessment: no apparent nausea or vomiting Anesthetic complications: no    Last Vitals:  Vitals:   02/05/18 1127 02/05/18 1228  BP: (!) 199/77 (!) 101/48  Pulse: 75 69  Resp: 16 20  Temp:  36.7 C  SpO2: 98% 96%    Last Pain:  Vitals:   02/05/18 1228  TempSrc:   PainSc: 0-No pain                 Pearlena Ow,W. EDMOND

## 2018-02-06 LAB — CBC
HEMATOCRIT: 35.2 % — AB (ref 36.0–46.0)
HEMOGLOBIN: 10.6 g/dL — AB (ref 12.0–15.0)
MCH: 24 pg — ABNORMAL LOW (ref 26.0–34.0)
MCHC: 30.1 g/dL (ref 30.0–36.0)
MCV: 79.8 fL — ABNORMAL LOW (ref 80.0–100.0)
Platelets: 230 10*3/uL (ref 150–400)
RBC: 4.41 MIL/uL (ref 3.87–5.11)
RDW: 19.8 % — AB (ref 11.5–15.5)
WBC: 6.6 10*3/uL (ref 4.0–10.5)
nRBC: 0 % (ref 0.0–0.2)

## 2018-02-06 LAB — HEPARIN LEVEL (UNFRACTIONATED): HEPARIN UNFRACTIONATED: 0.31 [IU]/mL (ref 0.30–0.70)

## 2018-02-06 MED ORDER — METOPROLOL TARTRATE 25 MG PO TABS
12.5000 mg | ORAL_TABLET | Freq: Two times a day (BID) | ORAL | Status: DC
  Administered 2018-02-06 – 2018-02-07 (×2): 12.5 mg via ORAL
  Filled 2018-02-06 (×2): qty 1

## 2018-02-06 MED ORDER — APIXABAN 5 MG PO TABS: 5.0000 mg | ORAL_TABLET | Freq: Two times a day (BID) | ORAL | Status: DC

## 2018-02-06 MED ORDER — APIXABAN 5 MG PO TABS
10.0000 mg | ORAL_TABLET | Freq: Once | ORAL | Status: AC
  Administered 2018-02-06: 10 mg via ORAL
  Filled 2018-02-06: qty 2

## 2018-02-06 MED ORDER — FERROUS SULFATE 325 (65 FE) MG PO TABS
325.0000 mg | ORAL_TABLET | Freq: Two times a day (BID) | ORAL | Status: DC
  Administered 2018-02-06 – 2018-02-07 (×2): 325 mg via ORAL
  Filled 2018-02-06 (×2): qty 1

## 2018-02-06 MED ORDER — APIXABAN 5 MG PO TABS
10.0000 mg | ORAL_TABLET | Freq: Two times a day (BID) | ORAL | Status: DC
  Administered 2018-02-06 – 2018-02-07 (×2): 10 mg via ORAL
  Filled 2018-02-06 (×2): qty 2

## 2018-02-06 MED ORDER — METOPROLOL TARTRATE 25 MG PO TABS
25.0000 mg | ORAL_TABLET | Freq: Two times a day (BID) | ORAL | Status: DC
  Administered 2018-02-06: 25 mg via ORAL
  Filled 2018-02-06: qty 1

## 2018-02-06 MED ORDER — METOPROLOL TARTRATE 25 MG PO TABS: 25.0000 mg | ORAL_TABLET | Freq: Two times a day (BID) | ORAL | Status: DC

## 2018-02-06 MED ORDER — HEPARIN (PORCINE) 25000 UT/250ML-% IV SOLN
1300.0000 [IU]/h | INTRAVENOUS | Status: AC
  Administered 2018-02-06: 1300 [IU]/h via INTRAVENOUS

## 2018-02-06 MED ORDER — METOPROLOL TARTRATE 5 MG/5ML IV SOLN
2.5000 mg | Freq: Four times a day (QID) | INTRAVENOUS | Status: DC | PRN
  Administered 2018-02-06: 2.5 mg via INTRAVENOUS
  Filled 2018-02-06: qty 5

## 2018-02-06 NOTE — Progress Notes (Signed)
Lori [name]   DOB:[birth year]   WK#:[id number]   XYV#:[id number]  Subjective:  Had an uneventful night, agitating to go home; no SOB, cough, pleurisy; no bleeding or bruising. No family in room.   Objective: middle aged White woman exmained in bed Vitals:   02/06/18 0722 02/06/18 0748  BP: (!) 131/91   Pulse: (!) 106 (!) 105  Resp: 18   Temp: 98.4 F (36.9 C)   SpO2: 96%     Body mass index is 30.05 kg/m.  Intake/Output Summary (Last 24 hours) at 02/06/2018 0857 Last data filed at 02/06/2018 0600 Gross per 24 hour  Intake 454.25 ml  Output -  Net 454.25 ml    CBG (last 3)  No results for input(s): GLUCAP in the last 72 hours.   Labs:  Lab Results  Component Value Date   WBC 6.6 02/06/2018   HGB 10.6 (L) 02/06/2018   HCT 35.2 (L) 02/06/2018   MCV 79.8 (L) 02/06/2018   PLT 230 02/06/2018   NEUTROABS 2.9 02/04/2018    @LASTCHEMISTRY @  Urine Studies No results for input(s): UHGB, CRYS in the last 72 hours.  Invalid input(s): UACOL, UAPR, USPG, UPH, UTP, UGL, UKET, UBIL, UNIT, UROB, ULEU, UEPI, UWBC, URBC, UBAC, CAST, [place], [place]  Basic Metabolic Panel: Recent Labs  Lab 02/03/18 1152 02/03/18 1659 02/04/18 0130 02/05/18 0249  NA 144  --  144 143  K 4.4  --  4.0 3.6  CL 110  --  112* 109  CO2 26  --  26 26  GLUCOSE 102*  --  110* 99  BUN 20  --  24* 11  CREATININE 0.91 0.90 0.88 0.84  CALCIUM 8.9  --  8.9 8.7*   GFR Estimated Creatinine Clearance: 61.2 mL/min (by C-G formula based on SCr of 0.84 mg/dL). Liver Function Tests: Recent Labs  Lab 02/03/18 1152  AST 20  ALT 20  BILITOT 0.4  PROT 5.9*   No results for input(s): LIPASE, AMYLASE in the last 168 hours. No results for input(s): AMMONIA in the last 168 hours. Coagulation profile Recent Labs  Lab 02/04/18 1009 02/04/18 1139 02/04/18 1351  INR SPECIMEN CLOTTED QUESTIONABLE RESULTS, RECOMMEND RECOLLECT TO VERIFY 1.04    CBC: Recent Labs  Lab 02/03/18 1152 02/04/18 0130 02/04/18 1139  02/05/18 0249 02/06/18 0127  WBC 4.8 4.9 4.5 5.4 6.6  NEUTROABS 3,298 2.9  --   --   --   HGB 5.6* 5.6* 7.6* 10.4* 10.6*  HCT 20.2* 21.4* 26.2* 34.6* 35.2*  MCV 71.9* 76.4* 78.0* 79.2* 79.8*  PLT 286 263 192 258 230   Cardiac Enzymes: Recent Labs  Lab 02/03/18 1152 02/04/18 1000  TROPONINI 0.01 <0.03   BNP: Invalid input(s): POCBNP CBG: No results for input(s): GLUCAP in the last 168 hours. D-Dimer Recent Labs    02/03/18 1152  DDIMER 1.18*   Hgb A1c No results for input(s): HGBA1C in the last 72 hours. Lipid Profile Recent Labs    02/03/18 1152  CHOL 93  HDL 39*  LDLCALC 35  TRIG 102  CHOLHDL 2.4   Thyroid function studies Recent Labs    02/03/18 1152  TSH 3.14   Anemia work up Recent Labs    02/04/18 0130  VITAMINB12 1,055*  FOLATE 12.6  FERRITIN 2*  TIBC 455*  IRON 17*  RETICCTPCT 2.1   Microbiology No results found for this or any previous visit (from the past 240 hour(s)).    Studies:  No results found.  Assessment: 77  y.o. [place] woman admitted with a LLL pulmonary embolus and found to have a Hb of 5.6 with unremarkable WBC and platelets;  MCV 71.9 (previous 93.8), ferritin 2  (1) EGD/ colonoscopy 02/05/2018 shows esophagitis, hiatal hernia, multiple gastric erosions, and a 0.6 cm colonic polyp             (a) CEA on 02/04/2018 was 1.7  (2) spinal stenosis with chronic back pain, on NSAIDS for pain control  (3) iron deficiency anemia: Likely related to NSAIDS use for spinal stenosis symptoms--no evidence of cancer by labs, endoscopy or CT of the chest    Plan:  May transition from heparin to NOAC of your choice; however in my experience patients are sometimes d/c'd on a NOAC only to find when they go to their pharmacy they cannot afford the drug. Would suggest SW or Pharmacy contact patient's outpatient pharmacy to find out out of pocket cost and if too high consider warfarin (which will require patient to be set up for  outpatient monitoring).  Her iron deficiency can be managed with oral iron (she also received one dose of feraheme this admission). I am starting that today.  Since she will be anticoagulated, her risk of bleeding is increased. I would recommend checking her Hb every 3 months, which she would prefer to have done through her PCP's office.  She has been advised to avoid NSAIDs. She will have pain from her spinal stenosis. She can use tylenol for that. Consider adding tramadol or celebrex (another expensive option). She may benefit at some point from referral to [facility] group--Dr Maryjean Ka (anesthesia/ pain management).  Will sign off at this point. Please reconsult as needed.  Chauncey Cruel, MD 02/06/2018  8:57 AM Medical Oncology and Hematology [facility] [address] [place], [place] [address] Tel. [phone]    Fax. [phone]

## 2018-02-06 NOTE — Progress Notes (Addendum)
[facility] Progress Note   Chief Complaint:   anemia   SUBJECTIVE:    Feels okay today.    ASSESSMENT AND PLAN:   55. 77 yo female with newly diagnosed left PE, on IV heparin.   2. Recurrent, severe anemia (on NSAIDS). She is now iron deficient.  IDA most likely secondary to [facility] erosions and Grade C reflux esophagitis seen on EGD yesterday -esophageal biopsies pending.  -Protonix (pantoprazole) 40 mg PO BID for 1 month then 40 mg po qam long term. -discussed anti-reflux measures -Recommend IV iron infusion prior to discharge -hgb up from 5.6 to 10.6 post 4 units of blood.  3. Colon polyp. A 6 mm sigmoid polyp was removed on yesterday's colonoscopy.  -Our office will contact her with path report.     Attending Physician Note   I have taken an interval history, reviewed the chart and examined the patient. I agree with the Advanced Practitioner's note, impression and recommendations.   Chronic blood loss leading to iron deficiency from [place] erosions associated with her hiatal hernia. LA Class Grade C esophagitis, esophageal stricture. Avoid ASA/NSAIDs. Recommend IV Fe in hospital. PPI bid for 1 month then PPI qam long term. PCP to follow and manage her anemia. GI follow up in 6 weeks. OK to change to PO anticoagulant from GI standpoint. GI signing off.   Lucio Edward, MD FACG [phone]    OBJECTIVE:     Vital signs in last 24 hours: Temp:  [97.9 F (36.6 C)-99.1 F (37.3 C)] 98.4 F (36.9 C) (11/24 0722) Pulse Rate:  [69-106] 105 (11/24 0748) Resp:  [16-25] 18 (11/24 0722) BP: (101-199)/(46-91) 131/91 (11/24 0722) SpO2:  [93 %-100 %] 96 % (11/24 0722) Last BM Date: 02/05/18 General:   Alert, well-developed female in NAD Heart:  Regular rate and rhythm.  No lower extremity edema   Pulm: Normal respiratory effort Abdomen:  Soft, nondistended, nontender.  Normal bowel sounds, no masses felt.       Neurologic:  Alert and  oriented x4;   grossly normal neurologically. Psych:  Pleasant, cooperative.  Normal mood and affect.   Intake/Output from previous day: 11/23 0701 - 11/24 0700 In: 454.3 [I.V.:454.3] Out: -  Intake/Output this shift: No intake/output data recorded.  Lab Results: Recent Labs    02/04/18 1139 02/05/18 0249 02/06/18 0127  WBC 4.5 5.4 6.6  HGB 7.6* 10.4* 10.6*  HCT 26.2* 34.6* 35.2*  PLT 192 258 230   BMET Recent Labs    02/03/18 1152 02/03/18 1659 02/04/18 0130 02/05/18 0249  NA 144  --  144 143  K 4.4  --  4.0 3.6  CL 110  --  112* 109  CO2 26  --  26 26  GLUCOSE 102*  --  110* 99  BUN 20  --  24* 11  CREATININE 0.91 0.90 0.88 0.84  CALCIUM 8.9  --  8.9 8.7*   LFT Recent Labs    02/03/18 1152  PROT 5.9*  AST 20  ALT 20  BILITOT 0.4   PT/INR Recent Labs    02/04/18 1139 02/04/18 1351  LABPROT QUESTIONABLE RESULTS, RECOMMEND RECOLLECT TO VERIFY 13.5  INR QUESTIONABLE RESULTS, RECOMMEND RECOLLECT TO VERIFY 1.04    Principal Problem:   Symptomatic anemia Active Problems:   Essential hypertension   Atrial fibrillation ([name])   Acute pulmonary embolism ([name])   Pericardial effusion   Benign neoplasm of sigmoid colon   Gastroesophageal reflux disease with esophagitis  LOS: 2 days   Tye Savoy ,NP 02/06/2018, 9:06 AM

## 2018-02-06 NOTE — Progress Notes (Signed)
[facility] for IV heparin Indication: pulmonary embolus  Heparin level = 0.31 at low-end of therapeutic range (0.3-0.7) 8hr after restarting drip at 1200 units/hr following EGD/colonoscopy.  Hgb stable at 10.6, Plts wnl. No bleeding or complications with IV site per discussion with RN.  Plan: - Increase heparin drip to 1300 units/hr - Recheck heparin level in 8hrs  - Monitor for signs of bleeding  Peggyann Juba, PharmD, BCPS 02/06/2018 2:55 AM

## 2018-02-06 NOTE — Progress Notes (Signed)
PROGRESS NOTE    Lori [name]  VPX:[id number] DOB: Sep 20, 1940 DOA: 02/04/2018 PCP: No primary care provider on file.    Brief Narrative: 77 year old with past medical history significant for atrial fibrillation, she has refused anticoagulation, vertigo, who presents complaining of shortness of breath on exertion, lightheadedness. Evaluation by patient's PCP revealed hemoglobin at 5 and pulmonary embolism. Patient was admitted for treatment of PE and anemia. She has received 2 units of packed red blood cell. Her hemoglobin has increased to 7.5. She will receive 2 more units of packed red blood cell. She will be started on heparin to treat for PE. GI has been consulted for evaluation of iron deficiency anemia.    Assessment & Plan:   Principal Problem:   Symptomatic anemia Active Problems:   Essential hypertension   Atrial fibrillation ([name])   Acute pulmonary embolism ([name])   Pericardial effusion   Benign neoplasm of sigmoid colon   Gastroesophageal reflux disease with esophagitis   1-Anemia, iron deficiency. Symptomatic. Secondary to esophagitis and cameron erosion.  Iron  And ferritin low.  Received  Ferra heme.  GI consulted for further evaluation.  CEA. Normal.  She has received 4 units of PRBC.  Monitor on heparin gtt.  Underwent endoscopy,; showed esophagitis, mild esophageal stenosis,  and erosive gastropathy associate with hiatal hernia, cameron erosion.  Colonoscopy; polyp 6 mm removed.  Continue with PPI IV .  Needs PPI twice daily for 1 month then daily for long-term. Hemoglobin remained stable.  2-PE;  Discussed with oncology.  Will transition IV heparin to Eliquis. CEA negative. Consult GI for anemia.  ECHO normal RV function. Diastolic dysfunction.  Doppler negative.  Care management consult to make sure patient will be able to afford her Eliquis  3-A fib; developed RVR this morning.  Now back in sinus rhythm. Started on low-dose  metoprolol.  4-Hypertension; PRN hydralazine.    RN Pressure Injury Documentation:    Malnutrition Type:      Malnutrition Characteristics:      Nutrition Interventions:     Estimated body mass index is 30.05 kg/m as calculated from the following:   Height as of this encounter: 5\' 5"  (1.651 m).   Weight as of this encounter: 81.9 kg.   DVT prophylaxis: Heparin  Code Status: full code.  Family Communication: no family at bedside.  Disposition Plan: Transition to oral anticoagulation today.  Patient developed A. fib with RVR today, monitor on metoprolol.  New medication.  Consultants:  GI Oncology, phone conversation    Procedures:  ECHO Doppler.    Antimicrobials:  none   Subjective: Denies bright red blood in the stool.  Denies chest pain.  He was noted to have A. fib with RVR this morning.   Objective: Vitals:   02/06/18 0449 02/06/18 0722 02/06/18 0748 02/06/18 1248  BP: 134/62 (!) 131/91  131/64  Pulse: 73 (!) 106 (!) 105 72  Resp: 16 18  18   Temp: 98.5 F (36.9 C) 98.4 F (36.9 C)  99.1 F (37.3 C)  TempSrc: Oral Oral  Oral  SpO2: 97% 96%  98%  Weight:      Height:        Intake/Output Summary (Last 24 hours) at 02/06/2018 1452 Last data filed at 02/06/2018 1248 Gross per 24 hour  Intake 482.01 ml  Output -  Net 482.01 ml   Filed Weights   02/04/18 0046 02/04/18 1202  Weight: 83 kg 81.9 kg    Examination:  General exam: No acute  distress. Respiratory system: Normal respiratory effort, crackles at the bases. Cardiovascular system: 1 S2 regular rhythm rate Gastrointestinal system: Bowel sounds present soft nontender nondistended. Central nervous system: Nonfocal Extremities: Symmetric power Skin: no rashes.    Data Reviewed: I have personally reviewed following labs and imaging studies  CBC: Recent Labs  Lab 02/03/18 1152 02/04/18 0130 02/04/18 1139 02/05/18 0249 02/06/18 0127  WBC 4.8 4.9 4.5 5.4 6.6  NEUTROABS  3,298 2.9  --   --   --   HGB 5.6* 5.6* 7.6* 10.4* 10.6*  HCT 20.2* 21.4* 26.2* 34.6* 35.2*  MCV 71.9* 76.4* 78.0* 79.2* 79.8*  PLT 286 263 192 258 196   Basic Metabolic Panel: Recent Labs  Lab 02/03/18 1152 02/03/18 1659 02/04/18 0130 02/05/18 0249  NA 144  --  144 143  K 4.4  --  4.0 3.6  CL 110  --  112* 109  CO2 26  --  26 26  GLUCOSE 102*  --  110* 99  BUN 20  --  24* 11  CREATININE 0.91 0.90 0.88 0.84  CALCIUM 8.9  --  8.9 8.7*   GFR: Estimated Creatinine Clearance: 61.2 mL/min (by C-G formula based on SCr of 0.84 mg/dL). Liver Function Tests: Recent Labs  Lab 02/03/18 1152  AST 20  ALT 20  BILITOT 0.4  PROT 5.9*   No results for input(s): LIPASE, AMYLASE in the last 168 hours. No results for input(s): AMMONIA in the last 168 hours. Coagulation Profile: Recent Labs  Lab 02/04/18 1009 02/04/18 1139 02/04/18 1351  INR SPECIMEN CLOTTED QUESTIONABLE RESULTS, RECOMMEND RECOLLECT TO VERIFY 1.04   Cardiac Enzymes: Recent Labs  Lab 02/03/18 1152 02/04/18 1000  TROPONINI 0.01 <0.03   BNP (last 3 results) No results for input(s): PROBNP in the last 8760 hours. HbA1C: No results for input(s): HGBA1C in the last 72 hours. CBG: No results for input(s): GLUCAP in the last 168 hours. Lipid Profile: No results for input(s): CHOL, HDL, LDLCALC, TRIG, CHOLHDL, LDLDIRECT in the last 72 hours. Thyroid Function Tests: No results for input(s): TSH, T4TOTAL, FREET4, T3FREE, THYROIDAB in the last 72 hours. Anemia Panel: Recent Labs    02/04/18 0130  VITAMINB12 1,055*  FOLATE 12.6  FERRITIN 2*  TIBC 455*  IRON 17*  RETICCTPCT 2.1   Sepsis Labs: No results for input(s): PROCALCITON, LATICACIDVEN in the last 168 hours.  No results found for this or any previous visit (from the past 240 hour(s)).       Radiology Studies: No results found.      Scheduled Meds: . apixaban  10 mg Oral BID   Followed by  . [START ON 02/13/2018] apixaban  5 mg Oral BID   . ferrous sulfate  325 mg Oral BID WC  . metoprolol tartrate  12.5 mg Oral BID  . pantoprazole  40 mg Oral BID  . sodium chloride flush  3 mL Intravenous Q12H  . sodium chloride flush  3 mL Intravenous Q12H   Continuous Infusions: . sodium chloride       LOS: 2 days    Time spent: 35 minutes.     Elmarie Shiley, MD [facility] Pager [phone]  If 7PM-7AM, please contact night-coverage [email or web address] Password [email or web address] 02/06/2018, 2:52 PM

## 2018-02-06 NOTE — Progress Notes (Signed)
ANTICOAGULATION CONSULT NOTE  Pharmacy Consult for apixaban Indication: pulmonary embolus  No Known Allergies  Patient Measurements: Height: 5\' 5"  (165.1 cm) Weight: 180 lb 8.9 oz (81.9 kg) IBW/kg (Calculated) : 57 Heparin Dosing Weight: 74 kg  Vital Signs: Temp: 98.4 F (36.9 C) (11/24 0722) Temp Source: Oral (11/24 0722) BP: 131/91 (11/24 0722) Pulse Rate: 105 (11/24 0748)  Labs: Recent Labs    02/03/18 1659  02/04/18 0130 02/04/18 1000 02/04/18 1009 02/04/18 1139 02/04/18 1351 02/04/18 1809 02/05/18 0249 02/06/18 0127  HGB  --    < > 5.6*  --   --  7.6*  --   --  10.4* 10.6*  HCT  --    < > 21.4*  --   --  26.2*  --   --  34.6* 35.2*  PLT  --    < > 263  --   --  192  --   --  258 230  APTT  --   --   --   --  SPECIMEN CLOTTED QUESTIONABLE RESULTS, RECOMMEND RECOLLECT TO VERIFY 103*  --   --   --   LABPROT  --   --   --   --  SPECIMEN CLOTTED QUESTIONABLE RESULTS, RECOMMEND RECOLLECT TO VERIFY 13.5  --   --   --   INR  --   --   --   --  SPECIMEN CLOTTED QUESTIONABLE RESULTS, RECOMMEND RECOLLECT TO VERIFY 1.04  --   --   --   HEPARINUNFRC  --   --   --   --   --   --   --  0.75* 0.66 0.31  CREATININE 0.90  --  0.88  --   --   --   --   --  0.84  --   TROPONINI  --   --   --  <0.03  --   --   --   --   --   --    < > = values in this interval not displayed.    Estimated Creatinine Clearance: 61.2 mL/min (by C-G formula based on SCr of 0.84 mg/dL).   Medical History: Past Medical History:  Diagnosis Date  . Arthritis   . Asthma   . Hyperlipidemia   . Hypertension   . Migraines   . Prediabetes   . Vertigo   . Vitamin D deficiency     Medications:  Medications Prior to Admission  Medication Sig Dispense Refill Last Dose  . aspirin EC 81 MG tablet Take 162 mg by mouth daily.   02/03/2018 at Unknown time  . Cholecalciferol (VITAMIN D PO) Take 2,000-4,000 mg by mouth daily.    Past Week at Unknown time  . diazepam (VALIUM) 5 MG tablet Take 1/2 to 1 tab tid  prn for anxiety or VERTIGO 90 tablet 1 Past Month at Unknown time  . Multiple Vitamins-Minerals (CENTRUM SILVER 50+WOMEN PO) Take 1 tablet by mouth daily.    Past Week at Unknown time  . predniSONE (DELTASONE) 10 MG tablet TAKE 2 TABLETS BY MOUTH DAILY WITH BREAKFAST. (Patient taking differently: Take 20 mg by mouth daily as needed (leg pain). ) 60 tablet 0 Past Month at Unknown time  . TURMERIC PO Take 1 tablet by mouth daily.    Past Week at Unknown time   Scheduled:  . apixaban  10 mg Oral Once  . apixaban  10 mg Oral BID   Followed by  . [START ON  02/13/2018] apixaban  5 mg Oral BID  . ferrous sulfate  325 mg Oral BID WC  . metoprolol tartrate  12.5 mg Oral BID  . pantoprazole  40 mg Oral BID  . sodium chloride flush  3 mL Intravenous Q12H  . sodium chloride flush  3 mL Intravenous Q12H   PRN: sodium chloride, acetaminophen **OR** acetaminophen, diazepam, hydrALAZINE, metoprolol tartrate, ondansetron **OR** ondansetron (ZOFRAN) IV, sodium chloride flush  Assessment: 3 yoF with PMH HTN, HLD, vertigo, Afib not on anticoag d/t patient refusal presents 11/21 with exertional dyspnea, lightheadedness. PCP visit revealed severe anemia with Hgb 5.6 and acute PE. Admitted for management; Pharmacy to dose IV heparin.   Baseline INR, aPTT: pending  Prior anticoagulation: none  Patient has iron deficient anemia. Hgb increased s/p transfusion.  Significant events:  11/22: 2nd unit of blood still needs to finish infusing prior to CBC recheck, but MD would like to start heparin now. Transfused 2 units PRBC.  11/22: FOBT negative  11/23: upper endoscopy and colonoscopy performed  11/24: Converted from heparin drip to apixaban  Today, 02/06/2018:  CBC: Hgb 10.6 - slightly low but stable  Plt 230 WNL - stable  SCr 0.8, CrCl ~61 mL/min  Patient is currently on heparin drip infusing at 1300 units/hr. Pharmacy received consult to convert from heparin to apixaban for treatment of PE  today.  Per hematology note, patient has been advised to avoid NSAIDs.  Goal of Therapy: Monitor platelets by anticoagulation protocol: Yes  Plan:  Stop heparin infusion now and start apixaban  Apixaban 10 mg PO BID x 7 days followed by apixaban 5 mg PO BID  CBC with AM labs tomorrow  Monitor for signs of bleeding or thrombosis  Pharmacy will need to provide medication counseling and manufacturer coupon prior to discharge  Lenis Noon, PharmD Clinical Pharmacist 02/06/18 11:59 AM

## 2018-02-07 ENCOUNTER — Encounter (HOSPITAL_COMMUNITY): Payer: Self-pay | Admitting: Gastroenterology

## 2018-02-07 LAB — CBC
HEMATOCRIT: 36.3 % (ref 36.0–46.0)
HEMOGLOBIN: 10.7 g/dL — AB (ref 12.0–15.0)
MCH: 24 pg — ABNORMAL LOW (ref 26.0–34.0)
MCHC: 29.5 g/dL — AB (ref 30.0–36.0)
MCV: 81.4 fL (ref 80.0–100.0)
Platelets: 201 10*3/uL (ref 150–400)
RBC: 4.46 MIL/uL (ref 3.87–5.11)
RDW: 21 % — AB (ref 11.5–15.5)
WBC: 5 10*3/uL (ref 4.0–10.5)
nRBC: 0 % (ref 0.0–0.2)

## 2018-02-07 MED ORDER — APIXABAN 5 MG PO TABS: 5.0000 mg | ORAL_TABLET | Freq: Two times a day (BID) | ORAL | 1 refills | Status: DC

## 2018-02-07 MED ORDER — APIXABAN 5 MG PO TABS: 10.0000 mg | ORAL_TABLET | Freq: Two times a day (BID) | ORAL | 0 refills | Status: DC

## 2018-02-07 MED ORDER — METOPROLOL TARTRATE 25 MG PO TABS: 12.5000 mg | ORAL_TABLET | Freq: Two times a day (BID) | ORAL | 0 refills | Status: DC

## 2018-02-07 MED ORDER — PANTOPRAZOLE SODIUM 40 MG PO TBEC: 40.0000 mg | DELAYED_RELEASE_TABLET | Freq: Two times a day (BID) | ORAL | 1 refills | Status: DC

## 2018-02-07 MED ORDER — FERROUS SULFATE 325 (65 FE) MG PO TABS: 325.0000 mg | ORAL_TABLET | Freq: Two times a day (BID) | ORAL | 3 refills | Status: DC

## 2018-02-07 MED ORDER — ACETAMINOPHEN 325 MG PO TABS: 650.0000 mg | ORAL_TABLET | Freq: Four times a day (QID) | ORAL | 0 refills | Status: DC | PRN

## 2018-02-07 MED ORDER — TRAMADOL HCL 50 MG PO TABS: 50.0000 mg | ORAL_TABLET | Freq: Three times a day (TID) | ORAL | 0 refills | Status: AC | PRN

## 2018-02-07 NOTE — Care Management Note (Signed)
Case Management Note  Patient Details  Name: ANARA COWMAN MRN: [id number] Date of Birth: [birth year]  Subjective/Objective: Benefit checked outcome-$45 co pay for Eliquis.Patient can afford. MD notified. No further CM needs.                   Action/Plan:d/c home.   Expected Discharge Date:  02/07/18               Expected Discharge Plan:  Home/Self Care  In-House Referral:     Discharge planning Services  CM Consult  Post Acute Care Choice:    Choice offered to:     DME Arranged:    DME Agency:     HH Arranged:    HH Agency:     Status of Service:  Completed, signed off  If discussed at [employer] of Stay Meetings, dates discussed:    Additional Comments:  Dessa Phi, RN 02/07/2018, 10:41 AM

## 2018-02-07 NOTE — Discharge Summary (Signed)
Physician Discharge Summary  Lori [name] OJJ:[id number] DOB: [birth year] DOA: 02/04/2018  PCP: No primary care provider on file.  Admit date: 02/04/2018 Discharge date: 02/07/2018  Admitted From: Home  Disposition:  Home   Recommendations for Outpatient Follow-up:  1. Follow up with PCP in 1-2 weeks 2. Please obtain BMP/CBC in one week 3.  might need further IV iron infusion.     Discharge Condition: Stable.  CODE STATUS: full code.  Diet recommendation: Heart Healthy  Brief/Interim Summary:  Brief Narrative: 77 year old with past medical history significant for atrial fibrillation, she has refused anticoagulation, vertigo, who presents complaining of shortness of breath on exertion, lightheadedness. Evaluation by patient's PCP revealed hemoglobin at 5 and pulmonary embolism. Patient was admitted for treatment of PE and anemia. She has received 2 units of packed red blood cell. Her hemoglobin has increased to 7.5. She will receive 2 more units of packed red blood cell. She will be started on heparin to treat for PE. GI has been consulted for evaluation of iron deficiency anemia.    Assessment & Plan:   Principal Problem:   Symptomatic anemia Active Problems:   Essential hypertension   Atrial fibrillation ([name])   Acute pulmonary embolism ([name])   Pericardial effusion   Benign neoplasm of sigmoid colon   Gastroesophageal reflux disease with esophagitis   1-Anemia, iron deficiency. Symptomatic. Secondary to esophagitis and cameron erosion.  Iron  And ferritin low.  Received  Ferra heme.  GI consulted for further evaluation.  CEA. Normal.  She has received 4 units of PRBC.  Monitor on heparin gtt.  Underwent endoscopy,; showed esophagitis, mild esophageal stenosis,  and erosive gastropathy associate with hiatal hernia, cameron erosion.  Colonoscopy; polyp 6 mm removed.  Continue with PPI IV .  Needs PPI twice daily for 1 month then daily for long-term. Hemoglobin  remained stable. Discharge on oral iron.   2-PE;  Discussed with oncology.  Will transition IV heparin to Eliquis. CEA negative. Consult GI for anemia.  ECHO normal RV function. Diastolic dysfunction.  Doppler negative.  Discharge on eliquis.   3-A fib; developed RVR this morning.  Now back in sinus rhythm. Started on low-dose metoprolol. Improved.   4-Hypertension; PRN hydralazine.   Discharge Diagnoses:  Principal Problem:   Symptomatic anemia Active Problems:   Essential hypertension   Atrial fibrillation ([name])   Acute pulmonary embolism ([name])   Pericardial effusion   Benign neoplasm of sigmoid colon   Gastroesophageal reflux disease with esophagitis    Discharge Instructions  Discharge Instructions    Diet - low sodium heart healthy   Complete by:  As directed    Increase activity slowly   Complete by:  As directed      Allergies as of 02/07/2018   No Known Allergies     Medication List    STOP taking these medications   aspirin EC 81 MG tablet   predniSONE 10 MG tablet Commonly known as:  DELTASONE   VITAMIN D PO     TAKE these medications   acetaminophen 325 MG tablet Commonly known as:  TYLENOL Take 2 tablets (650 mg total) by mouth every 6 (six) hours as needed for mild pain (or Fever >/= 101).   apixaban 5 MG Tabs tablet Commonly known as:  ELIQUIS Take 2 tablets (10 mg total) by mouth 2 (two) times daily for 6 days.   apixaban 5 MG Tabs tablet Commonly known as:  ELIQUIS Take 1 tablet (5 mg total) by mouth  2 (two) times daily. Start taking on:  02/13/2018   CENTRUM SILVER 50+WOMEN PO Take 1 tablet by mouth daily.   diazepam 5 MG tablet Commonly known as:  VALIUM Take 1/2 to 1 tab tid prn for anxiety or VERTIGO   ferrous sulfate 325 (65 FE) MG tablet Take 1 tablet (325 mg total) by mouth 2 (two) times daily with a meal.   metoprolol tartrate 25 MG tablet Commonly known as:  LOPRESSOR Take 0.5 tablets (12.5 mg total) by mouth 2  (two) times daily.   pantoprazole 40 MG tablet Commonly known as:  PROTONIX Take 1 tablet (40 mg total) by mouth 2 (two) times daily.   traMADol 50 MG tablet Commonly known as:  ULTRAM Take 1 tablet (50 mg total) by mouth every 8 (eight) hours as needed for up to 5 days.   TURMERIC PO Take 1 tablet by mouth daily.       No Known Allergies  Consultations: GI Oncology, DR Magrinat   Procedures/Studies: Dg Chest 2 View  Result Date: 02/03/2018 CLINICAL DATA:  Pt states 1 month of new sob with any type of walking or activity, she is unaware of any heart disease, hx non smoker, no meds for htn or diabetes EXAM: CHEST - 2 VIEW COMPARISON:  06/30/2017 FINDINGS: The heart is enlarged compared to prior study. There is fullness in the RIGHT hilar and suprahilar regions, raising the question of adenopathy. The lungs are clear. No pulmonary edema. Mild midthoracic spondylosis. Note is made of a hiatal hernia. IMPRESSION: 1. Enlarged cardiopericardial silhouette. Question of pericardial effusion. 2. Fullness in the RIGHT hilar/suprahilar regions, raising the question of adenopathy. Recommend further evaluation with chest CT. Recommend intravenous contrast unless contraindicated. These results will be called to the ordering clinician or representative by the Radiologist Assistant, and communication documented in the PACS or zVision Dashboard. Electronically Signed   By: Nolon Nations M.D.   On: 02/03/2018 13:25   Ct Chest W Contrast  Result Date: 02/03/2018 CLINICAL DATA:  Patient with dry cough for 3 weeks. Evaluate for possible hilar mass. EXAM: CT CHEST WITH CONTRAST TECHNIQUE: Multidetector CT imaging of the chest was performed during intravenous contrast administration. CONTRAST:  47mL OMNIPAQUE IOHEXOL 300 MG/ML  SOLN COMPARISON:  Chest radiograph 02/03/2018 FINDINGS: Cardiovascular: Heart is mildly enlarged. Small pericardial effusion. Aorta and main pulmonary artery normal in caliber.  Within the left lower lobe segmental pulmonary artery there is a filling defect (image 79; series 2) most compatible with pulmonary embolus. Mediastinum/Nodes: Moderate-sized hiatal hernia. No axillary, mediastinal or hilar lymphadenopathy. Lungs/Pleura: Central airways are patent. Dependent atelectasis within the bilateral lower lobes. No large area pulmonary consolidation. No pleural effusion or pneumothorax. Upper Abdomen: No acute process. Musculoskeletal: Thoracic spine degenerative changes. No aggressive or acute appearing osseous lesions. IMPRESSION: 1. There is a an acute segmental pulmonary embolus within the left lower lobe pulmonary arteries. 2. No large area of pulmonary consolidation. 3. No mediastinal or hilar mass. Critical Value/emergent results were called by telephone at the time of interpretation on 02/03/2018 at 5:33 pm to [facility] , who verbally acknowledged these results. Electronically Signed   By: Lovey Newcomer M.D.   On: 02/03/2018 17:37   Vas [place] Lower Extremity Venous (dvt)  Result Date: 02/06/2018  Lower Venous Study Indications: Pulmonary embolism.  Risk Factors: Confirmed PE. Performing Technologist: Toma Copier RVS  Examination Guidelines: A complete evaluation includes B-mode imaging, spectral Doppler, color Doppler, and power Doppler as needed of all accessible portions  of each vessel. Bilateral testing is considered an integral part of a complete examination. Limited examinations for reoccurring indications may be performed as noted.  Right Venous Findings: +---------+---------------+---------+-----------+----------+-------+          CompressibilityPhasicitySpontaneityPropertiesSummary +---------+---------------+---------+-----------+----------+-------+ CFV      Full           Yes      Yes                          +---------+---------------+---------+-----------+----------+-------+ SFJ      Full                                                  +---------+---------------+---------+-----------+----------+-------+ FV Prox  Full           Yes      Yes                          +---------+---------------+---------+-----------+----------+-------+ FV Mid   Full                                                 +---------+---------------+---------+-----------+----------+-------+ FV DistalFull           Yes      Yes                          +---------+---------------+---------+-----------+----------+-------+ PFV      Full           Yes      Yes                          +---------+---------------+---------+-----------+----------+-------+ POP      Full           Yes      Yes                          +---------+---------------+---------+-----------+----------+-------+ PTV      Full                                                 +---------+---------------+---------+-----------+----------+-------+ PERO     Full                                                 +---------+---------------+---------+-----------+----------+-------+  Left Venous Findings: +---------+---------------+---------+-----------+----------+-------+          CompressibilityPhasicitySpontaneityPropertiesSummary +---------+---------------+---------+-----------+----------+-------+ CFV      Full           Yes      Yes                          +---------+---------------+---------+-----------+----------+-------+ SFJ      Full                                                 +---------+---------------+---------+-----------+----------+-------+  FV Prox  Full           Yes      Yes                          +---------+---------------+---------+-----------+----------+-------+ FV Mid   Full                                                 +---------+---------------+---------+-----------+----------+-------+ FV DistalFull           Yes      Yes                           +---------+---------------+---------+-----------+----------+-------+ PFV      Full           Yes      Yes                          +---------+---------------+---------+-----------+----------+-------+ POP      Full           Yes      Yes                          +---------+---------------+---------+-----------+----------+-------+ PTV      Full                                                 +---------+---------------+---------+-----------+----------+-------+ PERO     Full                                                 +---------+---------------+---------+-----------+----------+-------+    Summary: Right: There is no evidence of deep vein thrombosis in the lower extremity. No cystic structure found in the popliteal fossa. Left: There is no evidence of deep vein thrombosis in the lower extremity. No cystic structure found in the popliteal fossa.  *See table(s) above for measurements and observations. Electronically signed by Harold Barban MD on 02/06/2018 at 5:34:55 PM.    Final      Subjective: Feeling better . Denies chest pain   Discharge Exam: Vitals:   02/06/18 2116 02/07/18 0604  BP: 137/66 (!) 146/78  Pulse: 72 63  Resp: 16 15  Temp: 99 F (37.2 C) 98.1 F (36.7 C)  SpO2: 97% 99%   Vitals:   02/06/18 0748 02/06/18 1248 02/06/18 2116 02/07/18 0604  BP:  131/64 137/66 (!) 146/78  Pulse: (!) 105 72 72 63  Resp:  18 16 15   Temp:  99.1 F (37.3 C) 99 F (37.2 C) 98.1 F (36.7 C)  TempSrc:  Oral Oral Oral  SpO2:  98% 97% 99%  Weight:      Height:        General: Pt is alert, awake, not in acute distress Cardiovascular: RRR, S1/S2 +, no rubs, no gallops Respiratory: CTA bilaterally, no wheezing, no rhonchi Abdominal: Soft, NT, ND, bowel sounds + Extremities: no edema, no cyanosis    The results of significant diagnostics from this hospitalization (including  imaging, microbiology, ancillary and laboratory) are listed below for reference.      Microbiology: No results found for this or any previous visit (from the past 240 hour(s)).   Labs: BNP (last 3 results) No results for input(s): BNP in the last 8760 hours. Basic Metabolic Panel: Recent Labs  Lab 02/03/18 1152 02/03/18 1659 02/04/18 0130 02/05/18 0249  NA 144  --  144 143  K 4.4  --  4.0 3.6  CL 110  --  112* 109  CO2 26  --  26 26  GLUCOSE 102*  --  110* 99  BUN 20  --  24* 11  CREATININE 0.91 0.90 0.88 0.84  CALCIUM 8.9  --  8.9 8.7*   Liver Function Tests: Recent Labs  Lab 02/03/18 1152  AST 20  ALT 20  BILITOT 0.4  PROT 5.9*   No results for input(s): LIPASE, AMYLASE in the last 168 hours. No results for input(s): AMMONIA in the last 168 hours. CBC: Recent Labs  Lab 02/03/18 1152 02/04/18 0130 02/04/18 1139 02/05/18 0249 02/06/18 0127 02/07/18 0544  WBC 4.8 4.9 4.5 5.4 6.6 5.0  NEUTROABS 3,298 2.9  --   --   --   --   HGB 5.6* 5.6* 7.6* 10.4* 10.6* 10.7*  HCT 20.2* 21.4* 26.2* 34.6* 35.2* 36.3  MCV 71.9* 76.4* 78.0* 79.2* 79.8* 81.4  PLT 286 263 192 258 230 201   Cardiac Enzymes: Recent Labs  Lab 02/03/18 1152 02/04/18 1000  TROPONINI 0.01 <0.03   BNP: Invalid input(s): POCBNP CBG: No results for input(s): GLUCAP in the last 168 hours. D-Dimer No results for input(s): DDIMER in the last 72 hours. Hgb A1c No results for input(s): HGBA1C in the last 72 hours. Lipid Profile No results for input(s): CHOL, HDL, LDLCALC, TRIG, CHOLHDL, LDLDIRECT in the last 72 hours. Thyroid function studies No results for input(s): TSH, T4TOTAL, T3FREE, THYROIDAB in the last 72 hours.  Invalid input(s): FREET3 Anemia work up No results for input(s): VITAMINB12, FOLATE, FERRITIN, TIBC, IRON, RETICCTPCT in the last 72 hours. Urinalysis    Component Value Date/Time   COLORURINE DARK YELLOW 04/09/2017 1155   APPEARANCEUR TURBID (A) 04/09/2017 1155   LABSPEC 1.036 (H) 04/09/2017 1155   PHURINE < OR = 5.0 04/09/2017 1155   GLUCOSEU TRACE  (A) 04/09/2017 1155   HGBUR NEGATIVE 04/09/2017 [place] 02/11/2016 1604   KETONESUR NEGATIVE 04/09/2017 1155   PROTEINUR 1+ (A) 04/09/2017 1155   NITRITE NEGATIVE 04/09/2017 1155   LEUKOCYTESUR 1+ (A) 04/09/2017 1155   Sepsis Labs Invalid input(s): PROCALCITONIN,  WBC,  LACTICIDVEN Microbiology No results found for this or any previous visit (from the past 240 hour(s)).   Time coordinating discharge: 35 minutes.   SIGNED:   Elmarie Shiley, MD  [facility] 02/07/2018, 3:27 PM Pager   If 7PM-7AM, please contact night-coverage [email or web address] Password [email or web address]

## 2018-02-07 NOTE — Discharge Instructions (Signed)
Information on my medicine - ELIQUIS (apixaban)  This medication education was reviewed with me or my healthcare representative as part of my discharge preparation.   Why was Eliquis prescribed for you? Eliquis was prescribed to treat blood clots that may have been found in the veins of your legs (deep vein thrombosis) or in your lungs (pulmonary embolism) and to reduce the risk of them occurring again.  What do You need to know about Eliquis ? The starting dose is 10 mg (two 5 mg tablets) taken TWICE daily for the FIRST SEVEN (7) DAYS, then on (enter date)  02/13/2018  the dose is reduced to ONE 5 mg tablet taken TWICE daily.  Eliquis may be taken with or without food.   Try to take the dose about the same time in the morning and in the evening. If you have difficulty swallowing the tablet whole please discuss with your pharmacist how to take the medication safely.  Take Eliquis exactly as prescribed and DO NOT stop taking Eliquis without talking to the doctor who prescribed the medication.  Stopping may increase your risk of developing a new blood clot.  Refill your prescription before you run out.  After discharge, you should have regular check-up appointments with your healthcare provider that is prescribing your Eliquis.    What do you do if you miss a dose? If a dose of ELIQUIS is not taken at the scheduled time, take it as soon as possible on the same day and twice-daily administration should be resumed. The dose should not be doubled to make up for a missed dose.  Important Safety Information A possible side effect of Eliquis is bleeding. You should call your healthcare provider right away if you experience any of the following: ? Bleeding from an injury or your nose that does not stop. ? Unusual colored urine (red or dark brown) or unusual colored stools (red or black). ? Unusual bruising for unknown reasons. ? A serious fall or if you hit your head (even if there is no  bleeding).  Some medicines may interact with Eliquis and might increase your risk of bleeding or clotting while on Eliquis. To help avoid this, consult your healthcare provider or pharmacist prior to using any new prescription or non-prescription medications, including herbals, vitamins, non-steroidal anti-inflammatory drugs (NSAIDs) and supplements.  This website has more information on Eliquis (apixaban): http://www.eliquis.com/eliquis/home

## 2018-02-14 ENCOUNTER — Telehealth: Payer: Self-pay | Admitting: *Deleted

## 2018-02-14 NOTE — Telephone Encounter (Signed)
Patient has follow up office visit. Knows to call with any questions or concerns.   

## 2018-02-14 NOTE — Telephone Encounter (Signed)
Called patient on 02/14/2018 , 9:50 AM in an attempt to reach the patient for a hospital follow up.   Admit date: 02/04/18 Discharge: 02/07/18   She DOES have any questions or concerns about medications from the hospital admission. The patient's medications were reviewed over the phone, they were counseled to bring in all current medications to the hospital follow up visit. Patient will discuss at visit.  I advised the patient to call if any questions or concerns arise about the hospital admission or medications    Home health WAS NOT  started in the hospital.  All questions were answered and a follow up appointment was made.   Prior to Admission medications   Medication Sig Start Date End Date Taking? Authorizing Provider  acetaminophen (TYLENOL) 325 MG tablet Take 2 tablets (650 mg total) by mouth every 6 (six) hours as needed for mild pain (or Fever >/= 101). 02/07/18   Regalado, Belkys A, MD  apixaban (ELIQUIS) 5 MG TABS tablet Take 2 tablets (10 mg total) by mouth 2 (two) times daily for 6 days. 02/07/18 02/13/18  Regalado, Belkys A, MD  apixaban (ELIQUIS) 5 MG TABS tablet Take 1 tablet (5 mg total) by mouth 2 (two) times daily. 02/13/18   Regalado, Belkys A, MD  diazepam (VALIUM) 5 MG tablet Take 1/2 to 1 tab tid prn for anxiety or VERTIGO 10/30/17   Unk Pinto, MD  ferrous sulfate 325 (65 FE) MG tablet Take 1 tablet (325 mg total) by mouth 2 (two) times daily with a meal. 02/07/18   Regalado, Belkys A, MD  metoprolol tartrate (LOPRESSOR) 25 MG tablet Take 0.5 tablets (12.5 mg total) by mouth 2 (two) times daily. 02/07/18   Regalado, Belkys A, MD  Multiple Vitamins-Minerals (CENTRUM SILVER 50+WOMEN PO) Take 1 tablet by mouth daily.     [provider]  pantoprazole (PROTONIX) 40 MG tablet Take 1 tablet (40 mg total) by mouth 2 (two) times daily. 02/07/18   Regalado, Belkys A, MD  TURMERIC PO Take 1 tablet by mouth daily.     [provider]

## 2018-02-14 NOTE — Progress Notes (Signed)
Hospital follow up  Assessment and Plan: Hospital visit follow up for   Other acute pulmonary embolism without acute cor pulmonale ([name]) -     COMPLETE METABOLIC PANEL WITH GFR  Symptomatic anemia -     pantoprazole (PROTONIX) 40 MG tablet; One pill daily to heal your stomach and prevent bleeding -     CBC with Differential/Platelet -     Iron,Total/Total Iron Binding Cap -     Ferritin - She will need H/H checked every 3 months  Paroxysmal atrial fibrillation ([name]) -     COMPLETE METABOLIC PANEL WITH GFR Continue elliquis, patient states she can afford it - continue metoprolol   All medications were reviewed with patient and family and fully reconciled. All questions answered fully, and patient and family members were encouraged to call the office with any further questions or concerns. Discussed goal to avoid readmission related to this diagnosis.  Medications Discontinued During This Encounter  Medication Reason  . apixaban (ELIQUIS) 5 MG TABS tablet Duplicate  . pantoprazole (PROTONIX) 40 MG tablet     Over 40 minutes of exam, counseling, chart review, and complex, high/moderate level critical decision making was performed this visit.   Future Appointments  Date Time Provider [place]  05/06/2018 10:00 AM Unk Pinto, MD [facility] None  08/04/2018 11:15 AM Vicie Mutters, PA-C [facility] None     HPI 77 y.o.female presents for follow up for transition from recent hospitalization or [facility] stay. Admit date to the hospital was 02/04/18, patient was discharged from the hospital on 02/07/18 and our clinical staff contacted the office the day after discharge to set up a follow up appointment. The discharge summary, medications, and diagnostic test results were reviewed before meeting with the patient.   The patient was admitted for:  Symptomatic anemia and atrial fibrillation with acute pulmonary embolism.   She was complaining of dyspnea, found to have a  hemoglobin of 5 and PE. She received 4 units of packed red blood cells total. H/H at discharge was 10/7 and 36.3. She under went EGD that shoed esophagitis and erosive gastropathy, colonoscopy had 6 mm polyp removed. She was discharged on Protonix 2 x a day for 1 month and then daily long term. She is on oral iron.   She was found to have a PT, discharged on eliquis should have completed the 10mg  BID for 7 days and should be on 5 mg BID, CEA negative, echo showed normal EF and diastolic dysfunction, negative doppler.  She was started on metoprolol 25mg  1/2 pill BID.   She states her breathing is better.    Lab Results  Component Value Date   WBC 5.0 02/07/2018   HGB 10.7 (L) 02/07/2018   HCT 36.3 02/07/2018   MCV 81.4 02/07/2018   PLT 201 02/07/2018   Blood pressure 132/60, pulse 64, temperature 98.5 F (36.9 C), height 5\' 4"  (1.626 m), weight 174 lb (78.9 kg), SpO2 98 %.   Home health is not involved.   Images while in the hospital: Vas [place] Lower Extremity Venous (dvt)  Result Date: 02/06/2018  Lower Venous Study Indications: Pulmonary embolism.   Summary: Right: There is no evidence of deep vein thrombosis in the lower extremity. No cystic structure found in the popliteal fossa. Left: There is no evidence of deep vein thrombosis in the lower extremity. No cystic structure found in the popliteal fossa.  *See table(s) above for measurements and observations. Electronically signed by Harold Barban MD on 02/06/2018 at 5:34:55  PM.    Final       Current Outpatient Medications (Cardiovascular):  .  metoprolol tartrate (LOPRESSOR) 25 MG tablet, Take 0.5 tablets (12.5 mg total) by mouth 2 (two) times daily.   Current Outpatient Medications (Analgesics):  .  acetaminophen (TYLENOL) 325 MG tablet, Take 2 tablets (650 mg total) by mouth every 6 (six) hours as needed for mild pain (or Fever >/= 101).  Current Outpatient Medications (Hematological):  .  apixaban (ELIQUIS) 5 MG TABS tablet,  Take 1 tablet (5 mg total) by mouth 2 (two) times daily. .  ferrous sulfate 325 (65 FE) MG tablet, Take 1 tablet (325 mg total) by mouth 2 (two) times daily with a meal.  Current Outpatient Medications (Other):  .  diazepam (VALIUM) 5 MG tablet, Take 1/2 to 1 tab tid prn for anxiety or VERTIGO .  Multiple Vitamins-Minerals (CENTRUM SILVER 50+WOMEN PO), Take 1 tablet by mouth daily.  .  TURMERIC PO, Take 1 tablet by mouth daily.  .  pantoprazole (PROTONIX) 40 MG tablet, One pill daily to heal your stomach and prevent bleeding  Past Medical History:  Diagnosis Date  . Arthritis   . Asthma   . Hyperlipidemia   . Hypertension   . Migraines   . Prediabetes   . Vertigo   . Vitamin D deficiency      No Known Allergies  ROS: all negative except above.   Physical Exam: Filed Weights   02/15/18 1110  Weight: 174 lb (78.9 kg)   BP 132/60   Pulse 64   Temp 98.5 F (36.9 C)   Ht 5\' 4"  (1.626 m)   Wt 174 lb (78.9 kg)   SpO2 98%   BMI 29.87 kg/m  General Appearance: Well nourished, in no apparent distress. Eyes: PERRLA, EOMs, conjunctiva no swelling or erythema Sinuses: No Frontal/maxillary tenderness ENT/Mouth: Ext aud canals clear, TMs without erythema, bulging. No erythema, swelling, or exudate on post pharynx.  Tonsils not swollen or erythematous. Hearing normal.  Neck: Supple, thyroid normal.  Respiratory: Respiratory effort normal, BS equal bilaterally without rales, rhonchi, wheezing or stridor.  Cardio: RRR with no MRGs. Brisk peripheral pulses without edema.  Abdomen: Soft, + BS.  Non tender, no guarding, rebound, hernias, masses. Lymphatics: Non tender without lymphadenopathy.  Musculoskeletal: Full ROM, 5/5 strength, normal gait.  Skin: Warm, dry without rashes, lesions, ecchymosis.  Neuro: Cranial nerves intact. Normal muscle tone, no cerebellar symptoms.  Psych: Awake and oriented X 3, normal affect, Insight and Judgment appropriate.     Vicie Mutters, PA-C 11:57  AM [facility]

## 2018-02-15 ENCOUNTER — Encounter: Payer: Self-pay | Admitting: Physician Assistant

## 2018-02-15 ENCOUNTER — Ambulatory Visit (INDEPENDENT_AMBULATORY_CARE_PROVIDER_SITE_OTHER): Payer: PPO | Admitting: Physician Assistant

## 2018-02-15 VITALS — BP 132/60 | HR 64 | Temp 98.5°F | Ht 64.0 in | Wt 174.0 lb

## 2018-02-15 DIAGNOSIS — I2699 Other pulmonary embolism without acute cor pulmonale: Secondary | ICD-10-CM

## 2018-02-15 DIAGNOSIS — D649 Anemia, unspecified: Secondary | ICD-10-CM

## 2018-02-15 DIAGNOSIS — I480 Paroxysmal atrial fibrillation: Secondary | ICD-10-CM

## 2018-02-15 MED ORDER — PANTOPRAZOLE SODIUM 40 MG PO TBEC: DELAYED_RELEASE_TABLET | ORAL | 3 refills | Status: DC

## 2018-02-15 NOTE — Patient Instructions (Addendum)
Pantoprazole is for you stomach- take this twice a day for 1 month- when your bottle runs out, get the prescription for once a day at the pharmacy.   Continue the metoprolol 81m 1/2 pill twice a day for your heart rate.   Continue the elliquis 568mtwice a day to help with the blood clot in your lungs, this also helps with the atrial fibrillation and will prevent you from getting another clot or having a stroke  You have a follow up in 2 months  Atrial Fibrillation Atrial fibrillation is a type of irregular or rapid heartbeat (arrhythmia). In atrial fibrillation, the heart quivers continuously in a chaotic pattern. This occurs when parts of the heart receive disorganized signals that make the heart unable to pump blood normally. This can increase the risk for stroke, heart failure, and other heart-related conditions. There are different types of atrial fibrillation, including:  Paroxysmal atrial fibrillation. This type starts suddenly, and it usually stops on its own shortly after it starts.  Persistent atrial fibrillation. This type often lasts longer than a week. It may stop on its own or with treatment.  Long-lasting persistent atrial fibrillation. This type lasts longer than 12 months.  Permanent atrial fibrillation. This type does not go away.  Talk with your health care provider to learn about the type of atrial fibrillation that you have. What are the causes? This condition is caused by some heart-related conditions or procedures, including:  A heart attack.  Coronary artery disease.  Heart failure.  Heart valve conditions.  High blood pressure.  Inflammation of the sac that surrounds the heart (pericarditis).  Heart surgery.  Certain heart rhythm disorders, such as Wolf-Parkinson-White syndrome.  Other causes include:  Pneumonia.  Obstructive sleep apnea.  Blockage of an artery in the lungs (pulmonary embolism, or PE).  Lung cancer.  Chronic lung  disease.  Thyroid problems, especially if the thyroid is overactive (hyperthyroidism).  Caffeine.  Excessive alcohol use or illegal drug use.  Use of some medicines, including certain decongestants and diet pills.  Sometimes, the cause cannot be found. What increases the risk? This condition is more likely to develop in:  People who are older in age.  People who smoke.  People who have diabetes mellitus.  People who are overweight (obese).  Athletes who exercise vigorously.  What are the signs or symptoms? Symptoms of this condition include:  A feeling that your heart is beating rapidly or irregularly.  A feeling of discomfort or pain in your chest.  Shortness of breath.  Sudden light-headedness or weakness.  Getting tired easily during exercise.  In some cases, there are no symptoms. How is this diagnosed? Your health care provider may be able to detect atrial fibrillation when taking your pulse. If detected, this condition may be diagnosed with:  An electrocardiogram (ECG).  A Holter monitor test that records your heartbeat patterns over a 24-hour period.  Transthoracic echocardiogram (TTE) to evaluate how blood flows through your heart.  Transesophageal echocardiogram (TEE) to view more detailed images of your heart.  A stress test.  Imaging tests, such as a CT scan or chest X-ray.  Blood tests.  How is this treated? The main goals of treatment are to prevent blood clots from forming and to keep your heart beating at a normal rate and rhythm. The type of treatment that you receive depends on many factors, such as your underlying medical conditions and how you feel when you are experiencing atrial fibrillation. This condition may  be treated with:  Medicine to slow down the heart rate, bring the heart's rhythm back to normal, or prevent clots from forming.  Electrical cardioversion. This is a procedure that resets your heart's rhythm by delivering a  controlled, low-energy shock to the heart through your skin.  Different types of ablation, such as catheter ablation, catheter ablation with pacemaker, or surgical ablation. These procedures destroy the heart tissues that send abnormal signals. When the pacemaker is used, it is placed under your skin to help your heart beat in a regular rhythm.  Follow these instructions at home:  Take over-the counter and prescription medicines only as told by your health care provider.  If your health care provider prescribed a blood-thinning medicine (anticoagulant), take it exactly as told. Taking too much blood-thinning medicine can cause bleeding. If you do not take enough blood-thinning medicine, you will not have the protection that you need against stroke and other problems.  Do not use tobacco products, including cigarettes, chewing tobacco, and e-cigarettes. If you need help quitting, ask your health care provider.  If you have obstructive sleep apnea, manage your condition as told by your health care provider.  Do not drink alcohol.  Do not drink beverages that contain caffeine, such as coffee, soda, and tea.  Maintain a healthy weight. Do not use diet pills unless your health care provider approves. Diet pills may make heart problems worse.  Follow diet instructions as told by your health care provider.  Exercise regularly as told by your health care provider.  Keep all follow-up visits as told by your health care provider. This is important. How is this prevented?  Avoid drinking beverages that contain caffeine or alcohol.  Avoid certain medicines, especially medicines that are used for breathing problems.  Avoid certain herbs and herbal medicines, such as those that contain ephedra or ginseng.  Do not use illegal drugs, such as cocaine and amphetamines.  Do not smoke.  Manage your high blood pressure. Contact a health care provider if:  You notice a change in the rate, rhythm,  or strength of your heartbeat.  You are taking an anticoagulant and you notice increased bruising.  You tire more easily when you exercise or exert yourself. Get help right away if:  You have chest pain, abdominal pain, sweating, or weakness.  You feel nauseous.  You notice blood in your vomit, bowel movement, or urine.  You have shortness of breath.  You suddenly have swollen feet and ankles.  You feel dizzy.  You have sudden weakness or numbness of the face, arm, or leg, especially on one side of the body.  You have trouble speaking, trouble understanding, or both (aphasia).  Your face or your eyelid droops on one side. These symptoms may represent a serious problem that is an emergency. Do not wait to see if the symptoms will go away. Get medical help right away. Call your local emergency services (911 in the [place] Do not drive yourself to the hospital. This information is not intended to replace advice given to you by your health care provider. Make sure you discuss any questions you have with your health care provider. Document Released: 03/02/2005 Document Revised: 07/10/2015 Document Reviewed: 06/27/2014 Elsevier Interactive Patient Education  2018 [employer].  Anemia Anemia is a condition in which you do not have enough red blood cells or hemoglobin. Hemoglobin is a substance in red blood cells that carries oxygen. When you do not have enough red blood cells or hemoglobin (are  anemic), your body cannot get enough oxygen and your organs may not work properly. As a result, you may feel very tired or have other problems. What are the causes? Common causes of anemia include:  Excessive bleeding. Anemia can be caused by excessive bleeding inside or outside the body, including bleeding from the intestine or from periods in women.  Poor nutrition.  Long-lasting (chronic) kidney, thyroid, and liver disease.  Bone marrow disorders.  Cancer and treatments for  cancer.  HIV (human immunodeficiency virus) and AIDS (acquired immunodeficiency syndrome).  Treatments for HIV and AIDS.  Spleen problems.  Blood disorders.  Infections, medicines, and autoimmune disorders that destroy red blood cells.  What are the signs or symptoms? Symptoms of this condition include:  Minor weakness.  Dizziness.  Headache.  Feeling heartbeats that are irregular or faster than normal (palpitations).  Shortness of breath, especially with exercise.  Paleness.  Cold sensitivity.  Indigestion.  Nausea.  Difficulty sleeping.  Difficulty concentrating.  Symptoms may occur suddenly or develop slowly. If your anemia is mild, you may not have symptoms. How is this diagnosed? This condition is diagnosed based on:  Blood tests.  Your medical history.  A physical exam.  Bone marrow biopsy.  Your health care provider may also check your stool (feces) for blood and may do additional testing to look for the cause of your bleeding. You may also have other tests, including:  Imaging tests, such as a CT scan or MRI.  Endoscopy.  Colonoscopy.  How is this treated? Treatment for this condition depends on the cause. If you continue to lose a lot of blood, you may need to be treated at a hospital. Treatment may include:  Taking supplements of iron, vitamin A83, or folic acid.  Taking a hormone medicine (erythropoietin) that can help to stimulate red blood cell growth.  Having a blood transfusion. This may be needed if you lose a lot of blood.  Making changes to your diet.  Having surgery to remove your spleen.  Follow these instructions at home:  Take over-the-counter and prescription medicines only as told by your health care provider.  Take supplements only as told by your health care provider.  Follow any diet instructions that you were given.  Keep all follow-up visits as told by your health care provider. This is important. Contact a  health care provider if:  You develop new bleeding anywhere in the body. Get help right away if:  You are very weak.  You are short of breath.  You have pain in your abdomen or chest.  You are dizzy or feel faint.  You have trouble concentrating.  You have bloody or black, tarry stools.  You vomit repeatedly or you vomit up blood. Summary  Anemia is a condition in which you do not have enough red blood cells or enough of a substance in your red blood cells that carries oxygen (hemoglobin).  Symptoms may occur suddenly or develop slowly.  If your anemia is mild, you may not have symptoms.  This condition is diagnosed with blood tests as well as a medical history and physical exam. Other tests may be needed.  Treatment for this condition depends on the cause of the anemia. This information is not intended to replace advice given to you by your health care provider. Make sure you discuss any questions you have with your health care provider. Document Released: 04/09/2004 Document Revised: 04/03/2016 Document Reviewed: 04/03/2016 Elsevier Interactive Patient Education  [employer].  Peptic Ulcer A peptic ulcer is a sore in the lining of the esophagus (esophageal ulcer), the stomach (gastric ulcer), or the first part of the small intestine (duodenal ulcer). The ulcer causes gradual wearing away (erosion) into the deeper tissue. What are the causes? Normally, the lining of the stomach and the small intestine protects itself from the acid that digests food. The protective lining can be damaged by:  An infection caused by a germ (bacterium) called Helicobacter pylori or H. pylori.  Regular use of NSAIDs, such as ibuprofen or aspirin.  Rare tumors in the stomach, small intestine, or pancreas (Zollinger-Ellison syndrome).  What increases the risk? The following factors may make you more likely to develop this condition:  Smoking.  Having a family history of ulcer  disease.  What are the signs or symptoms? Symptoms of this condition include:  Burning pain or gnawing in the area between the chest and the belly button. The pain may be worse on an empty stomach and at night.  Heartburn.  Nausea and vomiting.  Bloating.  If the ulcer results in bleeding, it can cause:  Black, tarry stools.  Vomiting of bright red blood.  Vomiting of material that looks like coffee grounds.  How is this diagnosed? This condition may be diagnosed based on:  Medical history and physical exam.  Various tests or procedures, such as: ? Blood tests, stool tests, or breath tests to check for the H. pylori bacterium. ? An X-ray exam (upper gastrointestinal series) of the esophagus, stomach, and small intestine. ? Upper endoscopy. The health care provider examines the esophagus, stomach, and small intestine using a small flexible tube that has a video camera at the end. ? Biopsy. A tissue sample is removed to be examined under a microscope.  How is this treated? Treatment for this condition may include:  Eliminating the cause of the ulcer, such as smoking or the use of NSAIDs or alcohol.  Medicines to reduce the amount of acid in your digestive tract.  Antibiotic medicines, if the ulcer is caused by the H. pylori bacterium.  An upper endoscopy to treat a bleeding ulcer.  Surgery, if the bleeding is severe or if the ulcer created a hole somewhere in the digestive system.  Follow these instructions at home:  Avoid alcohol and caffeine.  Do not use any tobacco products, such as cigarettes, chewing tobacco, and e-cigarettes. If you need help quitting, ask your health care provider.  Take over-the-counter and prescription medicines only as told by your health care provider. Do not use over-the-counter medicines in place of prescription medicines unless your health care provider approves.  Keep all follow-up visits as told by your health care provider. This is  important. Contact a health care provider if:  Your symptoms do not improve within 7 days of starting treatment.  You have ongoing indigestion or heartburn. Get help right away if:  You have sudden, sharp, or persistent pain in your abdomen.  You have bloody or dark black, tarry stools.  You vomit blood or material that looks like coffee grounds.  You become light-headed or you feel faint.  You become weak.  You become sweaty or clammy. This information is not intended to replace advice given to you by your health care provider. Make sure you discuss any questions you have with your health care provider. Document Released: 02/28/2000 Document Revised: 08/05/2015 Document Reviewed: 12/01/2014 Elsevier Interactive Patient Education  [employer].

## 2018-02-16 LAB — CBC WITH DIFFERENTIAL/PLATELET
BASOS ABS: 70 {cells}/uL (ref 0–200)
Basophils Relative: 1.2 %
EOS ABS: 41 {cells}/uL (ref 15–500)
Eosinophils Relative: 0.7 %
HEMATOCRIT: 40.4 % (ref 35.0–45.0)
HEMOGLOBIN: 12.6 g/dL (ref 11.7–15.5)
LYMPHS ABS: 1189 {cells}/uL (ref 850–3900)
MCH: 25.2 pg — AB (ref 27.0–33.0)
MCHC: 31.2 g/dL — AB (ref 32.0–36.0)
MCV: 80.8 fL (ref 80.0–100.0)
Monocytes Relative: 10.3 %
NEUTROS ABS: 3903 {cells}/uL (ref 1500–7800)
NEUTROS PCT: 67.3 %
Platelets: 178 10*3/uL (ref 140–400)
RBC: 5 10*6/uL (ref 3.80–5.10)
RDW: 24.2 % — AB (ref 11.0–15.0)
Total Lymphocyte: 20.5 %
WBC: 5.8 10*3/uL (ref 3.8–10.8)
WBCMIX: 597 {cells}/uL (ref 200–950)

## 2018-02-16 LAB — COMPLETE METABOLIC PANEL WITH GFR
AG Ratio: 2.8 (calc) — ABNORMAL HIGH (ref 1.0–2.5)
ALBUMIN MSPROF: 4.4 g/dL (ref 3.6–5.1)
ALKALINE PHOSPHATASE (APISO): 93 U/L (ref 33–130)
ALT: 22 U/L (ref 6–29)
AST: 16 U/L (ref 10–35)
BUN / CREAT RATIO: 18 (calc) (ref 6–22)
BUN: 20 mg/dL (ref 7–25)
CO2: 30 mmol/L (ref 20–32)
CREATININE: 1.14 mg/dL — AB (ref 0.60–0.93)
Calcium: 9.6 mg/dL (ref 8.6–10.4)
Chloride: 106 mmol/L (ref 98–110)
GFR, Est African American: 54 mL/min/{1.73_m2} — ABNORMAL LOW (ref >=60)
GFR, Est Non African American: 47 mL/min/{1.73_m2} — ABNORMAL LOW (ref >=60)
Globulin: 1.6 g/dL (calc) — ABNORMAL LOW (ref 1.9–3.7)
Glucose, Bld: 80 mg/dL (ref 65–99)
Potassium: 4.6 mmol/L (ref 3.5–5.3)
SODIUM: 143 mmol/L (ref 135–146)
Total Bilirubin: 0.5 mg/dL (ref 0.2–1.2)
Total Protein: 6 g/dL — ABNORMAL LOW (ref 6.1–8.1)

## 2018-02-16 LAB — FERRITIN: Ferritin: 219 ng/mL (ref 16–288)

## 2018-02-16 LAB — IRON, TOTAL/TOTAL IRON BINDING CAP
%SAT: 31 % (calc) (ref 16–45)
IRON: 110 ug/dL (ref 45–160)
TIBC: 350 mcg/dL (calc) (ref 250–450)

## 2018-02-21 ENCOUNTER — Encounter: Payer: Self-pay | Admitting: Gastroenterology

## 2018-03-31 ENCOUNTER — Other Ambulatory Visit: Payer: Self-pay

## 2018-03-31 MED ORDER — METOPROLOL TARTRATE 25 MG PO TABS: ORAL_TABLET | ORAL | 1 refills | Status: DC

## 2018-04-11 ENCOUNTER — Other Ambulatory Visit: Payer: Self-pay

## 2018-04-11 MED ORDER — APIXABAN 5 MG PO TABS: 5.0000 mg | ORAL_TABLET | Freq: Two times a day (BID) | ORAL | 2 refills | Status: DC

## 2018-05-06 ENCOUNTER — Encounter: Payer: Self-pay | Admitting: Internal Medicine

## 2018-05-26 ENCOUNTER — Encounter: Payer: Self-pay | Admitting: Internal Medicine

## 2018-05-26 ENCOUNTER — Ambulatory Visit (INDEPENDENT_AMBULATORY_CARE_PROVIDER_SITE_OTHER): Payer: Medicare Other | Admitting: Internal Medicine

## 2018-05-26 ENCOUNTER — Other Ambulatory Visit: Payer: Self-pay

## 2018-05-26 VITALS — BP 140/82 | HR 60 | Temp 97.3°F | Resp 16 | Ht 64.0 in | Wt 169.0 lb

## 2018-05-26 DIAGNOSIS — I10 Essential (primary) hypertension: Secondary | ICD-10-CM | POA: Diagnosis not present

## 2018-05-26 DIAGNOSIS — Z0000 Encounter for general adult medical examination without abnormal findings: Secondary | ICD-10-CM | POA: Diagnosis not present

## 2018-05-26 DIAGNOSIS — N390 Urinary tract infection, site not specified: Secondary | ICD-10-CM

## 2018-05-26 DIAGNOSIS — D500 Iron deficiency anemia secondary to blood loss (chronic): Secondary | ICD-10-CM | POA: Diagnosis not present

## 2018-05-26 DIAGNOSIS — R7309 Other abnormal glucose: Secondary | ICD-10-CM | POA: Diagnosis not present

## 2018-05-26 DIAGNOSIS — E782 Mixed hyperlipidemia: Secondary | ICD-10-CM | POA: Diagnosis not present

## 2018-05-26 DIAGNOSIS — E559 Vitamin D deficiency, unspecified: Secondary | ICD-10-CM | POA: Diagnosis not present

## 2018-05-26 DIAGNOSIS — Z23 Encounter for immunization: Secondary | ICD-10-CM | POA: Diagnosis not present

## 2018-05-26 DIAGNOSIS — Z1211 Encounter for screening for malignant neoplasm of colon: Secondary | ICD-10-CM

## 2018-05-26 DIAGNOSIS — Z1212 Encounter for screening for malignant neoplasm of rectum: Secondary | ICD-10-CM

## 2018-05-26 DIAGNOSIS — I480 Paroxysmal atrial fibrillation: Secondary | ICD-10-CM

## 2018-05-26 DIAGNOSIS — Z136 Encounter for screening for cardiovascular disorders: Secondary | ICD-10-CM | POA: Diagnosis not present

## 2018-05-26 DIAGNOSIS — Z8249 Family history of ischemic heart disease and other diseases of the circulatory system: Secondary | ICD-10-CM

## 2018-05-26 DIAGNOSIS — Z79899 Other long term (current) drug therapy: Secondary | ICD-10-CM

## 2018-05-26 DIAGNOSIS — Z87891 Personal history of nicotine dependence: Secondary | ICD-10-CM

## 2018-05-26 DIAGNOSIS — Z0001 Encounter for general adult medical examination with abnormal findings: Secondary | ICD-10-CM

## 2018-05-26 MED ORDER — PNEUMOCOCCAL 13-VAL CONJ VACC IM SUSP: 0.5000 mL | INTRAMUSCULAR | Status: DC

## 2018-05-26 NOTE — Patient Instructions (Signed)

## 2018-05-26 NOTE — Progress Notes (Signed)
[facility] Unk Pinto, M.D.     Lori [name]. Silverio Lay, P.A.-C [name], [place] [address] [place] [address] Telephone [phone] Telefax [phone] Annual Screening/Preventative Visit & Comprehensive Evaluation &  Examination     This very nice 78 y.o. DWF presents for a Screening /Preventative Visit & comprehensive evaluation and management of multiple medical co-morbidities.  Patient has been followed for HTN, HLD, T2_NIDDM  Prediabetes  and Vitamin D Deficiency. In 2001, she had surgery for a Brain Aneurysm and has had  intermittant vertigo since then.      Patient was hospitalized in Nov 2019  with a severe GI bleed (hgb 5 gm%) requiring  pRBC transfusions and found to have esophagitis, erosive gastropathy and a small colon polyp & was d/c'd on PPI. During hospitalization, she had pAfib w/RVR and was started on Eliquis & metoprolol. She had prior hx/o pAfib in Jan 2019 and admantly refused Coumadin/NOAC opting to take LD bASA. (CHADsVASc=3)      HTN predates circa 2009. In 2009 she had a False (+) Myoview and a negative Heart Cath.  Patient's BP has been controlled at home and patient denies any cardiac symptoms as chest pain, palpitations, shortness of breath, dizziness or ankle swelling. Today's BP is at goal - 140/82.      Patient's hyperlipidemia is controlled with diet and medications. Patient denies myalgias or other medication SE's. Last lipids were at goal: Lab Results  Component Value Date   CHOL 158 05/26/2018   HDL 54 05/26/2018   LDLCALC 85 05/26/2018   TRIG 97 05/26/2018   CHOLHDL 2.9 05/26/2018      Patient has hx/o prediabetes  (A1c 5.9% / 2013)     and patient denies reactive hypoglycemic symptoms, visual blurring, diabetic polys or paresthesias. Last A1c was Normal & at goal: Lab Results  Component Value Date   HGBA1C 5.6 05/26/2018      Finally, patient has  history of Vitamin D Deficiency ("11" / 2008)   and last Vitamin D was still low: Lab Results  Component Value Date   VD25OH 39 05/26/2018   Current Outpatient Medications on File Prior to Visit  Medication Sig  . acetaminophen (TYLENOL) 325 MG tablet Take 2 tablets (650 mg total) by mouth every 6 (six) hours as needed for mild pain (or Fever >/= 101).  Marland Kitchen apixaban (ELIQUIS) 5 MG TABS tablet Take 1 tablet (5 mg total) by mouth 2 (two) times daily.  . diazepam (VALIUM) 5 MG tablet Take 1/2 to 1 tab tid prn for anxiety or VERTIGO  . ferrous sulfate 325 (65 FE) MG tablet Take 1 tablet (325 mg total) by mouth 2 (two) times daily with a meal.  . metoprolol tartrate (LOPRESSOR) 25 MG tablet Take one tablet daily  . Multiple Vitamins-Minerals (CENTRUM SILVER 50+WOMEN PO) Take 1 tablet by mouth daily.   . pantoprazole (PROTONIX) 40 MG tablet One pill daily to heal your stomach and prevent bleeding   No current facility-administered medications on file prior to visit.    No Known Allergies Past Medical History:  Diagnosis Date  . Arthritis   . Asthma   . Hyperlipidemia   . Hypertension   . Migraines   . Prediabetes   . Vertigo   . Vitamin D deficiency    Health Maintenance  Topic Date Due  . INFLUENZA VACCINE  10/14/2017  . TETANUS/TDAP  01/25/2024  . COLONOSCOPY  02/06/2028  .  DEXA SCAN  Completed  . PNA vac Low Risk Adult  Completed   Immunization History  Administered Date(s) Administered  . DT 01/24/2014  . Pneumococcal Conjugate-13 05/26/2018  . Pneumococcal Polysaccharide-23 10/06/2011  . Td 04/23/2003   Last EGD & Colon - 02/05/2018 - Dr Fuller Plan  Last MGM - Last was  09/20/2012 & has refused since.  Past Surgical History:  Procedure Laterality Date  . BIOPSY  02/05/2018   Procedure: BIOPSY;  Surgeon: Ladene Artist, MD;  Location: [facility];  Service: Endoscopy;;  . CATARACT EXTRACTION W/ INTRAOCULAR LENS  IMPLANT, BILATERAL  2009  . CEREBRAL ANEURYSM REPAIR    .  COLONOSCOPY N/A 02/05/2018   Procedure: COLONOSCOPY;  Surgeon: Ladene Artist, MD;  Location: [facility];  Service: Endoscopy;  Laterality: N/A;  . ESOPHAGOGASTRODUODENOSCOPY N/A 02/05/2018   Procedure: ESOPHAGOGASTRODUODENOSCOPY (EGD);  Surgeon: Ladene Artist, MD;  Location: [facility];  Service: Endoscopy;  Laterality: N/A;  . FOOT SURGERY  1993  . POLYPECTOMY  02/05/2018   Procedure: POLYPECTOMY;  Surgeon: Ladene Artist, MD;  Location: [facility];  Service: Endoscopy;;  . TUBAL LIGATION     Family History  Problem Relation Age of Onset  . Hypertension Mother   . Diabetes Mother   . Cancer Mother        Pancreatic  . Cancer Father        Skin   Social History  Patient has over # 20 Dogs, a goat and cats living INSIDE her home.  Tobacco Use  . Smoking status: Former Smoker    Last attempt to quit: 08/07/1990    Years since quitting: 27.8  . Smokeless tobacco: Never Used  Substance Use Topics  . Alcohol use: No    Alcohol/week: 0.0 standard drinks  . Drug use: No    ROS Constitutional: Denies fever, chills, weight loss/gain, headaches, insomnia,  night sweats, and change in appetite. Does c/o fatigue. Eyes: Denies redness, blurred vision, diplopia, discharge, itchy, watery eyes.  ENT: Denies discharge, congestion, post nasal drip, epistaxis, sore throat, earache, hearing loss, dental pain, Tinnitus, Vertigo, Sinus pain, snoring.  Cardio: Denies chest pain, palpitations, irregular heartbeat, syncope, dyspnea, diaphoresis, orthopnea, PND, claudication, edema Respiratory: denies cough, dyspnea, DOE, pleurisy, hoarseness, laryngitis, wheezing.  Gastrointestinal: Denies dysphagia, heartburn, reflux, water brash, pain, cramps, nausea, vomiting, bloating, diarrhea, constipation, hematemesis, melena, hematochezia, jaundice, hemorrhoids Genitourinary: Denies dysuria, frequency, urgency, nocturia, hesitancy, discharge, hematuria, flank pain Breast: Breast lumps, nipple  discharge, bleeding.  Musculoskeletal: Denies arthralgia, myalgia, stiffness, Jt. Swelling, pain, limp, and strain/sprain. Denies falls. Skin: Denies puritis, rash, hives, warts, acne, eczema, changing in skin lesion Neuro: No weakness, tremor, incoordination, spasms, paresthesia, pain Psychiatric: Denies confusion, memory loss, sensory loss. Denies Depression. Endocrine: Denies change in weight, skin, hair change, nocturia, and paresthesia, diabetic polys, visual blurring, hyper / hypo glycemic episodes.  Heme/Lymph: No excessive bleeding, bruising, enlarged lymph nodes.  Physical Exam  BP 140/82   Pulse 60   Temp (!) 97.3 F (36.3 C)   Resp 16   Ht 5\' 4"  (1.626 m)   Wt 169 lb (76.7 kg)   BMI 29.01 kg/m   General Appearance: Well nourished, well groomed and in no apparent distress.  Eyes: PERRLA, EOMs, conjunctiva no swelling or erythema, normal fundi and vessels. Sinuses: No frontal/maxillary tenderness ENT/Mouth: EACs patent / TMs  nl. Nares clear without erythema, swelling, mucoid exudates. Oral hygiene is good. No erythema, swelling, or exudate. Tongue normal, non-obstructing. Tonsils not swollen or erythematous.  Hearing normal.  Neck: Supple, thyroid not palpable. No bruits, nodes or JVD. Respiratory: Respiratory effort normal.  BS equal and clear bilateral without rales, rhonci, wheezing or stridor. Cardio: Heart sounds are normal with regular rate and rhythm and no murmurs, rubs or gallops. Peripheral pulses are normal and equal bilaterally without edema. No aortic or femoral bruits. Chest: symmetric with normal excursions and percussion. Breasts: Symmetric, without lumps, nipple discharge, retractions, or fibrocystic changes.  Abdomen: Flat, soft with bowel sounds active. Nontender, no guarding, rebound, hernias, masses, or organomegaly.  Lymphatics: Non tender without lymphadenopathy.  Genitourinary:  Musculoskeletal: Full ROM all peripheral extremities, joint stability,  5/5 strength, and normal gait. Skin: Warm and dry without rashes, lesions, cyanosis, clubbing or  ecchymosis.  Neuro: Cranial nerves intact, reflexes equal bilaterally. Normal muscle tone, no cerebellar symptoms. Sensation intact.  Pysch: Alert and oriented X 3, normal affect, Insight and Judgment appropriate.   Assessment and Plan  1. Annual Preventative Screening Examination  2. Essential hypertension  - EKG 12-Lead - Urinalysis, Routine w reflex microscopic - Microalbumin / creatinine urine ratio - CBC with Differential/Platelet - COMPLETE METABOLIC PANEL WITH GFR - Magnesium - TSH  3. Hyperlipidemia, mixed  - EKG 12-Lead - Lipid panel - TSH  4. Abnormal glucose  - EKG 12-Lead - Hemoglobin A1c - Insulin, random  5. Vitamin D deficiency  - VITAMIN D 25 Hydroxyl  6. Iron deficiency anemia due to chronic blood loss  - Iron,Total/Total Iron Binding Cap - Ferritin - CBC with Differential/Platelet  7. Paroxysmal atrial fibrillation ([name])  - EKG 12-Lead  8. Screening for ischemic heart disease  - EKG 12-Lead  9. FH: hypertension  - EKG 12-Lead  10. Former smoker  - EKG 12-Lead  11. Screening for colorectal cancer  - POC Hemoccult Bld/Stl  12. Medication management  - Urinalysis, Routine w reflex microscopic - CBC with Differential/Platelet - COMPLETE METABOLIC PANEL WITH GFR - Magnesium - Lipid panel - TSH - Hemoglobin A1c - Insulin, random - VITAMIN D 25 Hydroxyl  13. Need for prophylactic vaccination against Streptococcus pneumoniae (pneumococcus)  - pneumococcal 13-valent conjugate vaccine (PREVNAR 13) injection 0.5 mL - Pneumococcal conjugate vaccine 13-valent  14. Urinary tract infection without hematuria, site unspecified  - Urine Culture     Patient was counseled in prudent diet to achieve/maintain BMI less than 25 for weight control, BP monitoring, regular exercise and medications. Discussed med's effects and SE's. Screening labs  and tests as requested with regular follow-up as recommended. Over 40 minutes of exam, counseling, chart review and high complex critical decision making was performed.

## 2018-05-27 ENCOUNTER — Other Ambulatory Visit: Payer: Self-pay | Admitting: Internal Medicine

## 2018-05-27 DIAGNOSIS — N390 Urinary tract infection, site not specified: Secondary | ICD-10-CM | POA: Diagnosis not present

## 2018-05-27 LAB — COMPLETE METABOLIC PANEL WITH GFR
AG RATIO: 2.2 (calc) (ref 1.0–2.5)
ALKALINE PHOSPHATASE (APISO): 95 U/L (ref 37–153)
ALT: 17 U/L (ref 6–29)
AST: 16 U/L (ref 10–35)
Albumin: 4.2 g/dL (ref 3.6–5.1)
BUN: 15 mg/dL (ref 7–25)
CHLORIDE: 106 mmol/L (ref 98–110)
CO2: 31 mmol/L (ref 20–32)
Calcium: 9.4 mg/dL (ref 8.6–10.4)
Creat: 0.8 mg/dL (ref 0.60–0.93)
GFR, EST AFRICAN AMERICAN: 84 mL/min/{1.73_m2} (ref >=60)
GFR, EST NON AFRICAN AMERICAN: 72 mL/min/{1.73_m2} (ref >=60)
GLOBULIN: 1.9 g/dL (ref 1.9–3.7)
Glucose, Bld: 90 mg/dL (ref 65–99)
Potassium: 4.3 mmol/L (ref 3.5–5.3)
Sodium: 144 mmol/L (ref 135–146)
Total Bilirubin: 0.6 mg/dL (ref 0.2–1.2)
Total Protein: 6.1 g/dL (ref 6.1–8.1)

## 2018-05-27 LAB — HEMOGLOBIN A1C
Hgb A1c MFr Bld: 5.6 % of total Hgb (ref <5.7)
Mean Plasma Glucose: 114 (calc)
eAG (mmol/L): 6.3 (calc)

## 2018-05-27 LAB — CBC WITH DIFFERENTIAL/PLATELET
Absolute Monocytes: 637 cells/uL (ref 200–950)
Basophils Absolute: 40 cells/uL (ref 0–200)
Basophils Relative: 0.6 %
EOS PCT: 1.2 %
Eosinophils Absolute: 80 cells/uL (ref 15–500)
HEMATOCRIT: 42 % (ref 35.0–45.0)
HEMOGLOBIN: 14.4 g/dL (ref 11.7–15.5)
LYMPHS ABS: 1039 {cells}/uL (ref 850–3900)
MCH: 32.2 pg (ref 27.0–33.0)
MCHC: 34.3 g/dL (ref 32.0–36.0)
MCV: 94 fL (ref 80.0–100.0)
MONOS PCT: 9.5 %
NEUTROS ABS: 4904 {cells}/uL (ref 1500–7800)
Neutrophils Relative %: 73.2 %
Platelets: 189 10*3/uL (ref 140–400)
RBC: 4.47 10*6/uL (ref 3.80–5.10)
RDW: 13.2 % (ref 11.0–15.0)
Total Lymphocyte: 15.5 %
WBC: 6.7 10*3/uL (ref 3.8–10.8)

## 2018-05-27 LAB — LIPID PANEL
CHOL/HDL RATIO: 2.9 (calc) (ref <5.0)
Cholesterol: 158 mg/dL (ref <200)
HDL: 54 mg/dL (ref >=50)
LDL Cholesterol (Calc): 85 mg/dL (calc)
NON-HDL CHOLESTEROL (CALC): 104 mg/dL (ref <130)
TRIGLYCERIDES: 97 mg/dL (ref <150)

## 2018-05-27 LAB — URINALYSIS, ROUTINE W REFLEX MICROSCOPIC
Bacteria, UA: NONE SEEN /HPF
Bilirubin Urine: NEGATIVE
Glucose, UA: NEGATIVE
HYALINE CAST: NONE SEEN /LPF
Ketones, ur: NEGATIVE
Nitrite: NEGATIVE
SPECIFIC GRAVITY, URINE: 1.025 (ref 1.001–1.03)

## 2018-05-27 LAB — IRON, TOTAL/TOTAL IRON BINDING CAP
%SAT: 43 % (ref 16–45)
Iron: 115 ug/dL (ref 45–160)
TIBC: 270 ug/dL (ref 250–450)

## 2018-05-27 LAB — FERRITIN: Ferritin: 81 ng/mL (ref 16–288)

## 2018-05-27 LAB — TSH: TSH: 3.27 m[IU]/L (ref 0.40–4.50)

## 2018-05-27 LAB — VITAMIN D 25 HYDROXY (VIT D DEFICIENCY, FRACTURES): Vit D, 25-Hydroxy: 39 ng/mL (ref 30–100)

## 2018-05-27 LAB — MICROALBUMIN / CREATININE URINE RATIO
Creatinine, Urine: 233 mg/dL (ref 20–275)
MICROALB UR: 3.2 mg/dL
MICROALB/CREAT RATIO: 14 ug/mg{creat} (ref <30)

## 2018-05-27 LAB — INSULIN, RANDOM: INSULIN: 4.6 u[IU]/mL

## 2018-05-27 LAB — MAGNESIUM: Magnesium: 2.2 mg/dL (ref 1.5–2.5)

## 2018-05-28 LAB — URINE CULTURE
MICRO NUMBER:: 318362
SPECIMEN QUALITY:: ADEQUATE

## 2018-05-29 ENCOUNTER — Encounter: Payer: Self-pay | Admitting: Internal Medicine

## 2018-07-08 ENCOUNTER — Other Ambulatory Visit: Payer: Self-pay | Admitting: Physician Assistant

## 2018-08-04 ENCOUNTER — Ambulatory Visit: Payer: Self-pay | Admitting: Physician Assistant

## 2018-08-29 ENCOUNTER — Encounter: Payer: Self-pay | Admitting: Adult Health

## 2018-08-29 NOTE — Progress Notes (Signed)
MEDICARE ANNUAL WELLNESS VISIT AND 3 MONTH FOLLOW UP  Assessment:   Encounter for Medicare annual wellness exam 1 YEAR  Essential hypertension - continue medications, DASH diet, exercise and monitor at home. Call if greater than 130/80.  -     CBC with Differential/Platelet -     COMPLETE METABOLIC PANEL WITH GFR -     TSH  Chronic atrial fibrillation ([name]) rate controlled, on NOAC and metoprolol   History of PE On elequis; continue due to a. Fib; no concerns with excess bleeding  Abdominal aortic aneurysm (AAA) without rupture ([name]) Control blood pressure, cholesterol, glucose, increase exercise.  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   Medication management  Symptomatic anemia CBC/iron resolved to baseline - stop supplement  Migraine without status migrainosus, not intractable, unspecified migraine type Controlled at this time  Abnormal glucose Monitor  Uncomplicated asthma, unspecified asthma severity, unspecified whether persistent Controlled  Vitamin D deficiency Continue supplement   Overweight  - long discussion about weight loss, diet, and exercise -recommended diet heavy in fruits and veggies and low in animal meats, cheeses, and dairy products  Senile purpura ([place]) Discussed process, protect skin, sunscreen  Varicose veins - weight loss discussed, continue compression stockings and elevation   Patient declines most exam/vaccines- states want to enjoy what times he has and would declines any treatments.   Over 40 minutes of exam, counseling, chart review and critical decision making was performed Future Appointments  Date Time Provider [place]  12/05/2018 11:30 AM Unk Pinto, MD [facility] None  06/28/2019 10:00 AM Unk Pinto, MD  [facility] None     Plan:   During the course of the visit the patient was educated and counseled about appropriate screening and preventive services including:    Pneumococcal vaccine   Prevnar 13  Influenza vaccine  Td vaccine  Screening electrocardiogram  Bone densitometry screening  Colorectal cancer screening  Diabetes screening  Glaucoma screening  Nutrition counseling   Advanced directives: requested   Subjective:  Lori [name] is a 78 y.o. female who presents for Medicare Annual Wellness Visit and follow up  Patient was hospitalized in Nov 2019  with a severe GI bleed (hgb 5 gm%) requiring  pRBC transfusions and found to have esophagitis, erosive gastropathy and a small colon polyp & was initiated on PPI. During hospitalization, she had pAfib w/RVR and was started on Eliquis & metoprolol. She was also found to have PE/pericadial effusion. She had prior hx/o pAfib in Jan 2019 and admantly refused Coumadin/NOAC prior to this episode opting to take LD bASA. (CHADsVASc=3). CBCs and iron levels have since resolved to normal range.   Lab Results  Component Value Date   WBC 6.7 05/26/2018   HGB 14.4 05/26/2018   HCT 42.0 05/26/2018   MCV 94.0 05/26/2018   PLT 189 05/26/2018   Lab Results  Component Value Date   IRON 115 05/26/2018   TIBC 270 05/26/2018   FERRITIN 81 05/26/2018    She takes CBD oil PRN anxiety and LE pain, uses valium rarelly, tylenol as well for pain with aleve occasionally and reports she is managing well for vertigo and anxiety.   BMI is Body mass index is 30.28 kg/m., she has not been working on diet and exercise. Wt Readings from Last 3 Encounters:  09/01/18  176 lb 6.4 oz (80 kg)  05/26/18 169 lb (76.7 kg)  02/15/18 174 lb (78.9 kg)   Her blood pressure has been controlled at home, today their BP is BP: 140/76  She does not workout but is very active with 35 dogs and goats. She denies chest pain, shortness of breath, dizziness. In  2009, she had a false (+) Myoview confirmed by a Negative Heart Cath. She has history of Afib, CHA2DS2VASC 3, declined anticoag/NOAC for many years until recent PE and doing well on elequis.    She is not on cholesterol medication and denies myalgias. Her cholesterol is at goal. The cholesterol last visit was:   Lab Results  Component Value Date   CHOL 158 05/26/2018   HDL 54 05/26/2018   LDLCALC 85 05/26/2018   TRIG 97 05/26/2018   CHOLHDL 2.9 05/26/2018    She has been working on diet and exercise for prediabetes, and denies polydipsia, polyuria and visual disturbances. Last A1C in the office was:  Lab Results  Component Value Date   HGBA1C 5.6 05/26/2018   Lab Results  Component Value Date   GFRNONAA 72 05/26/2018   Patient is on Vitamin D supplement.   Lab Results  Component Value Date   VD25OH 39 05/26/2018      Medication Review: Current Outpatient Medications on File Prior to Visit  Medication Sig Dispense Refill  . acetaminophen (TYLENOL) 325 MG tablet Take 2 tablets (650 mg total) by mouth every 6 (six) hours as needed for mild pain (or Fever >/= 101). 30 tablet 0  . apixaban (ELIQUIS) 5 MG TABS tablet Take 1 tablet 2 x /day for A Fib 180 tablet 1  . Cholecalciferol 25 MCG (1000 UT) capsule Take 1,000 Units by mouth daily. Takes variable doses, 2-6 daily    . diazepam (VALIUM) 5 MG tablet Take 1/2 to 1 tab tid prn for anxiety or VERTIGO 90 tablet 1  . metoprolol tartrate (LOPRESSOR) 25 MG tablet Take one tablet daily 90 tablet 1  . Misc Natural Products (T-RELIEF CBD+13 SL) Place under the tongue. Takes CBD oil twice a day    . Multiple Vitamins-Minerals (CENTRUM SILVER 50+WOMEN PO) Take 1 tablet by mouth daily.     . pantoprazole (PROTONIX) 40 MG tablet One pill daily to heal your stomach and prevent bleeding 90 tablet 3   Current Facility-Administered Medications on File Prior to Visit  Medication Dose Route Frequency Provider Last Rate Last Dose  . pneumococcal  13-valent conjugate vaccine (PREVNAR 13) injection 0.5 mL  0.5 mL Intramuscular Tomorrow-1000 Unk Pinto, MD        No Known Allergies  Current Problems (verified) Patient Active Problem List   Diagnosis Date Noted  . Gastroesophageal reflux disease with esophagitis   . History of pulmonary embolism (01/2018) 02/04/2018  . Senile purpura ([place]) 07/20/2017  . Aortic aneurysm ([place]) 06/30/2017  . Atrial fibrillation ([place]) 06/16/2017  . Vertigo due to brain injury ([place]) 02/06/2015  . Obesity (BMI 30.0-34.9) 02/06/2015  . Medication management 11/18/2013  . Hyperlipidemia, mixed   . Abnormal glucose   . Vitamin D deficiency   . Asthma   . Essential hypertension   . Migraines    Screening Tests Immunization History  Administered Date(s) Administered  . DT 01/24/2014  . Pneumococcal Conjugate-13 05/26/2018  . Pneumococcal Polysaccharide-23 10/06/2011  . Td 04/23/2003   Preventative care: Last colonoscopy: 01/2018 no further follow up secondary to age per Dr. Fuller Plan Last EGD: 01/2018 Last mammogram: 09/2012 -  she states if she did have cancer she would not want treatment Last pap smear/pelvic exam: 2009 DEXA:2011 normal, declines further   Prior vaccinations: TD  2015  Influenza: declines Pneumococcal: 2013 Prevnar13: 2020 Shingles/Zostavax: declines  Names of Other Physician/Practitioners you currently use: 1. [facility] here for primary care 2. [place], eye doctor, last visit June 2017 3. Needs one, dentist, remote  Patient Care Team: Unk Pinto, MD as PCP - General (Internal Medicine) Unk Pinto, MD as PCP - Internal Medicine (Internal Medicine) Clent Jacks, MD as Consulting Physician (Ophthalmology) Ladene Artist, MD as Consulting Physician (Gastroenterology)  SURGICAL HISTORY She  has a past surgical history that includes Tubal ligation; Cerebral aneurysm repair; Cataract extraction w/ intraocular lens   implant, bilateral (2009); Foot surgery (1993); Colonoscopy (N/A, 02/05/2018); Esophagogastroduodenoscopy (N/A, 02/05/2018); biopsy (02/05/2018); and polypectomy (02/05/2018). FAMILY HISTORY Her family history includes Cancer in her father and mother; Diabetes in her mother; Hypertension in her mother. SOCIAL HISTORY She  reports that she quit smoking about 28 years ago. She has never used smokeless tobacco. She reports that she does not drink alcohol or use drugs.   MEDICARE WELLNESS OBJECTIVES: Physical activity: Current Exercise Habits: Home exercise routine, Type of exercise: walking, Time (Minutes): 20, Frequency (Times/Week): 7, Weekly Exercise (Minutes/Week): 140, Intensity: Mild, Exercise limited by: orthopedic condition(s) Cardiac risk factors: Cardiac Risk Factors include: advanced age (>73men, >62 women);dyslipidemia;hypertension;obesity (BMI >30kg/m2) Depression/mood screen:   Depression screen [facility] 2/9 09/01/2018  Decreased Interest 0  Down, Depressed, Hopeless 0  PHQ - 2 Score 0    ADLs:  In your present state of health, do you have any difficulty performing the following activities: 09/01/2018 05/29/2018  Hearing? N N  Vision? N N  Difficulty concentrating or making decisions? N N  Walking or climbing stairs? N N  Dressing or bathing? N N  Doing errands, shopping? N N  Some recent data might be hidden     Cognitive Testing  Alert? Yes  Normal Appearance?Yes  Oriented to person? Yes  Place? Yes   Time? Yes  Recall of three objects?  Yes  Can perform simple calculations? Yes  Displays appropriate judgment?Yes  Can read the correct time from a watch face?Yes  EOL planning: Does Patient Have a Medical Advance Directive?: No Would patient like information on creating a medical advance directive?: No - Patient declined  Review of Systems  Constitutional: Negative.  Negative for malaise/fatigue and weight loss.  HENT: Negative.  Negative for hearing loss and tinnitus.    Eyes: Negative.  Negative for blurred vision and double vision.  Respiratory: Negative.  Negative for cough, sputum production, shortness of breath and wheezing.   Cardiovascular: Negative.  Negative for chest pain, palpitations, orthopnea, claudication, leg swelling and PND.  Gastrointestinal: Negative.  Negative for abdominal pain, blood in stool, constipation, diarrhea, heartburn, melena, nausea and vomiting.  Genitourinary: Negative.   Musculoskeletal: Negative.  Negative for falls, joint pain and myalgias.  Skin: Negative.  Negative for rash.  Neurological: Negative for dizziness, tingling, sensory change, weakness and headaches.  Endo/Heme/Allergies: Negative for polydipsia.  Psychiatric/Behavioral: Negative.  Negative for depression, memory loss, substance abuse and suicidal ideas. The patient is not nervous/anxious and does not have insomnia.   All other systems reviewed and are negative.    Objective:     Today's Vitals   09/01/18 1123  BP: 140/76  Pulse: 69  Temp: (!) 97.5 F (36.4 C)  SpO2: 97%  Weight: 176 lb  6.4 oz (80 kg)  Height: 5\' 4"  (1.626 m)   Body mass index is 30.28 kg/m.  General appearance: alert, no distress, WD/WN, female HEENT: normocephalic, sclerae anicteric, TMs pearly, nares patent, no discharge or erythema, pharynx normal Oral cavity: MMM, no lesions Neck: supple, no lymphadenopathy, no thyromegaly, no masses Heart: RRR, normal S1, S2, no murmurs Lungs: CTA bilaterally, no wheezes, rhonchi, or rales Abdomen: +bs, soft, non tender, non distended, no masses, no hepatomegaly, no splenomegaly Musculoskeletal: nontender, no swelling, no obvious deformity Extremities: no edema, no cyanosis, no clubbing, numerous superficial varicose veins of bilateral lower legs Pulses: 2+ symmetric, upper and lower extremities, normal cap refill Neurological: alert, oriented x 3, CN2-12 intact, strength normal upper extremities and lower extremities, sensation normal  throughout, DTRs 2+ throughout, no cerebellar signs, gait normal Psychiatric: normal affect, behavior normal, pleasant   Medicare Attestation I have personally reviewed: The patient's medical and social history Their use of alcohol, tobacco or illicit drugs Their current medications and supplements The patient's functional ability including ADLs,fall risks, home safety risks, cognitive, and hearing and visual impairment Diet and physical activities Evidence for depression or mood disorders  The patient's weight, height, BMI, and visual acuity have been recorded in the chart.  I have made referrals, counseling, and provided education to the patient based on review of the above and I have provided the patient with a written personalized care plan for preventive services.     Lori Ribas, NP   09/01/2018

## 2018-09-01 ENCOUNTER — Ambulatory Visit (INDEPENDENT_AMBULATORY_CARE_PROVIDER_SITE_OTHER): Payer: PPO | Admitting: Adult Health

## 2018-09-01 ENCOUNTER — Other Ambulatory Visit: Payer: Self-pay

## 2018-09-01 ENCOUNTER — Encounter: Payer: Self-pay | Admitting: Adult Health

## 2018-09-01 VITALS — BP 140/76 | HR 69 | Temp 97.5°F | Ht 64.0 in | Wt 176.4 lb

## 2018-09-01 DIAGNOSIS — R6889 Other general symptoms and signs: Secondary | ICD-10-CM | POA: Diagnosis not present

## 2018-09-01 DIAGNOSIS — Z0001 Encounter for general adult medical examination with abnormal findings: Secondary | ICD-10-CM

## 2018-09-01 DIAGNOSIS — I10 Essential (primary) hypertension: Secondary | ICD-10-CM

## 2018-09-01 DIAGNOSIS — D125 Benign neoplasm of sigmoid colon: Secondary | ICD-10-CM

## 2018-09-01 DIAGNOSIS — Z86711 Personal history of pulmonary embolism: Secondary | ICD-10-CM

## 2018-09-01 DIAGNOSIS — R7309 Other abnormal glucose: Secondary | ICD-10-CM | POA: Diagnosis not present

## 2018-09-01 DIAGNOSIS — Z79899 Other long term (current) drug therapy: Secondary | ICD-10-CM | POA: Diagnosis not present

## 2018-09-01 DIAGNOSIS — E669 Obesity, unspecified: Secondary | ICD-10-CM

## 2018-09-01 DIAGNOSIS — I480 Paroxysmal atrial fibrillation: Secondary | ICD-10-CM

## 2018-09-01 DIAGNOSIS — I714 Abdominal aortic aneurysm, without rupture, unspecified: Secondary | ICD-10-CM

## 2018-09-01 DIAGNOSIS — E782 Mixed hyperlipidemia: Secondary | ICD-10-CM | POA: Diagnosis not present

## 2018-09-01 DIAGNOSIS — R42 Dizziness and giddiness: Secondary | ICD-10-CM

## 2018-09-01 DIAGNOSIS — I83813 Varicose veins of bilateral lower extremities with pain: Secondary | ICD-10-CM

## 2018-09-01 DIAGNOSIS — G43909 Migraine, unspecified, not intractable, without status migrainosus: Secondary | ICD-10-CM | POA: Diagnosis not present

## 2018-09-01 DIAGNOSIS — E559 Vitamin D deficiency, unspecified: Secondary | ICD-10-CM

## 2018-09-01 DIAGNOSIS — S069XAA Unspecified intracranial injury with loss of consciousness status unknown, initial encounter: Secondary | ICD-10-CM

## 2018-09-01 DIAGNOSIS — K2100 Gastro-esophageal reflux disease with esophagitis, without bleeding: Secondary | ICD-10-CM

## 2018-09-01 DIAGNOSIS — J45909 Unspecified asthma, uncomplicated: Secondary | ICD-10-CM

## 2018-09-01 DIAGNOSIS — E66811 Obesity, class 1: Secondary | ICD-10-CM

## 2018-09-01 DIAGNOSIS — I8390 Asymptomatic varicose veins of unspecified lower extremity: Secondary | ICD-10-CM

## 2018-09-01 DIAGNOSIS — D692 Other nonthrombocytopenic purpura: Secondary | ICD-10-CM

## 2018-09-01 DIAGNOSIS — Z0000 Encounter for general adult medical examination without abnormal findings: Secondary | ICD-10-CM

## 2018-09-01 HISTORY — DX: Asymptomatic varicose veins of unspecified lower extremity: I83.90

## 2018-09-01 NOTE — Patient Instructions (Addendum)
Lori [name] , Thank you for taking time to come for your Medicare Wellness Visit. I appreciate your ongoing commitment to your health goals. Please review the following plan we discussed and let me know if I can assist you in the future.   These are the goals we discussed: Goals    . Weight (lb) < 165 lb (74.8 kg)       This is a list of the screening recommended for you and due dates:  Health Maintenance  Topic Date Due  . Flu Shot  10/15/2018  . Tetanus Vaccine  01/25/2024  . DEXA scan (bone density measurement)  Completed  . Pneumonia vaccines  Completed     Schedule follow up with Dr. Katy Fitch and with dentist   Check to see if taking iron supplement - STOP   Abdominal Aortic Aneurysm  An aneurysm is a bulge in one of the blood vessels that carry blood away from the heart (artery). It happens when blood pushes up against a weak or damaged place in the wall of an artery. An abdominal aortic aneurysm happens in the main artery of the body (aorta). Some aneurysms may not cause problems. If it grows, it can burst or tear, causing bleeding inside the body. This is an emergency. It needs to be treated right away. What are the causes? The exact cause of this condition is not known. What increases the risk? The following may make you more likely to get this condition:  Being a female who is 32 years of age or older.  Being white (Caucasian).  Using tobacco.  Having a family history of aneurysms.  Having the following conditions: ? Hardening of the arteries (arteriosclerosis). ? Inflammation of the walls of an artery (arteritis). ? Certain genetic conditions. ? Being very overweight (obesity). ? An infection in the wall of the aorta (infectious aortitis). ? High cholesterol. ? High blood pressure (hypertension). What are the signs or symptoms? Symptoms depend on the size of the aneurysm and how fast it is growing. Most grow slowly and do not cause any symptoms. If symptoms  do occur, they may include:  Pain in the belly (abdomen), side, or back.  Feeling full after eating only small amounts of food.  Feeling a throbbing lump in the belly. Symptoms that the aneurysm has burst (ruptured) include:  Sudden, very bad pain in the belly, side, or back.  Feeling sick to your stomach (nauseous).  Throwing up (vomiting).  Feeling light-headed or passing out. How is this treated? Treatment for this condition depends on:  The size of the aneurysm.  How fast it is growing.  Your age.  Your risk of having it burst. If your aneurysm is smaller than 2 inches (5 cm), your doctor may manage it by:  Checking it often to see if it is getting bigger. You may have an imaging test (ultrasound) to check it every 3-6 months, every year, or every few years.  Giving you medicines to: ? Control blood pressure. ? Treat pain. ? Fight infection. If your aneurysm is larger than 2 inches (5 cm), you may need surgery to fix it. Follow these instructions at home: Lifestyle  Do not use any products that have nicotine or tobacco in them. This includes cigarettes, e-cigarettes, and chewing tobacco. If you need help quitting, ask your doctor.  Get regular exercise. Ask your doctor what types of exercise are best for you. Eating and drinking  Eat a heart-healthy diet. This includes eating plenty  of: ? Fresh fruits and vegetables. ? Whole grains. ? Low-fat (lean) protein. ? Low-fat dairy products.  Avoid foods that are high in saturated fat and cholesterol. These foods include red meat and some dairy products.  Do not drink alcohol if: ? Your doctor tells you not to drink. ? You are pregnant, may be pregnant, or are planning to become pregnant.  If you drink alcohol: ? Limit how much you use to:  0-1 drink a day for women.  0-2 drinks a day for men. ? Be aware of how much alcohol is in your drink. In the [place] one drink equals any of these:  One typical bottle of  beer (12 oz).  One-half glass of wine (5 oz).  One shot of hard liquor (1 oz). General instructions  Take over-the-counter and prescription medicines only as told by your doctor.  Keep your blood pressure within normal limits. Ask your doctor what your blood pressure should be.  Have your blood sugar (glucose) level and cholesterol levels checked regularly. Keep your blood sugar level and cholesterol levels within normal limits.  Avoid heavy lifting and activities that take a lot of effort. Ask your doctor what activities are safe for you.  Keep all follow-up visits as told by your doctor. This is important. ? Talk to your doctor about regular screenings to see if the aneurysm is getting bigger. Contact a doctor if you:  Have pain in your belly, side, or back.  Have a throbbing feeling in your belly.  Have a family history of aneurysms. Get help right away if you:  Have sudden, bad pain in your belly, side, or back.  Feel sick to your stomach.  Throw up.  Have trouble pooping (constipation).  Have trouble peeing (urinating).  Feel light-headed.  Have a fast heart rate when you stand.  Have sweaty skin that is cold to the touch (clammy).  Have shortness of breath.  Have a fever. These symptoms may be an emergency. Do not wait to see if the symptoms will go away. Get medical help right away. Call your local emergency services (911 in the [place] Do not drive yourself to the hospital. Summary  An aneurysm is a bulge in one of the blood vessels that carry blood away from the heart (artery). Some aneurysms may not cause problems.  You may need to have yours checked often. If it grows, it can burst or tear. This causes bleeding inside the body. It needs to be treated right away.  Follow instructions from your doctor about healthy lifestyle changes.  Keep all follow-up visits as told by your doctor. This is important. This information is not intended to replace advice  given to you by your health care provider. Make sure you discuss any questions you have with your health care provider. Document Released: 06/27/2012 Document Revised: 10/09/2017 Document Reviewed: 10/09/2017 Elsevier Interactive Patient Education  2019 [employer].

## 2018-09-02 LAB — COMPLETE METABOLIC PANEL WITH GFR
AG Ratio: 2.3 (calc) (ref 1.0–2.5)
ALT: 21 U/L (ref 6–29)
AST: 17 U/L (ref 10–35)
Albumin: 4.1 g/dL (ref 3.6–5.1)
Alkaline phosphatase (APISO): 88 U/L (ref 37–153)
BUN/Creatinine Ratio: 33 (calc) — ABNORMAL HIGH (ref 6–22)
BUN: 28 mg/dL — ABNORMAL HIGH (ref 7–25)
CO2: 28 mmol/L (ref 20–32)
Calcium: 8.9 mg/dL (ref 8.6–10.4)
Chloride: 110 mmol/L (ref 98–110)
Creat: 0.86 mg/dL (ref 0.60–0.93)
GFR, Est African American: 76 mL/min/{1.73_m2} (ref >=60)
GFR, Est Non African American: 66 mL/min/{1.73_m2} (ref >=60)
Globulin: 1.8 g/dL (calc) — ABNORMAL LOW (ref 1.9–3.7)
Glucose, Bld: 98 mg/dL (ref 65–99)
Potassium: 3.9 mmol/L (ref 3.5–5.3)
Sodium: 146 mmol/L (ref 135–146)
Total Bilirubin: 0.5 mg/dL (ref 0.2–1.2)
Total Protein: 5.9 g/dL — ABNORMAL LOW (ref 6.1–8.1)

## 2018-09-02 LAB — HEMOGLOBIN A1C
Hgb A1c MFr Bld: 5.4 % of total Hgb (ref <5.7)
Mean Plasma Glucose: 108 (calc)
eAG (mmol/L): 6 (calc)

## 2018-09-02 LAB — CBC WITH DIFFERENTIAL/PLATELET
Absolute Monocytes: 713 cells/uL (ref 200–950)
Basophils Absolute: 44 cells/uL (ref 0–200)
Basophils Relative: 0.5 %
Eosinophils Absolute: 44 cells/uL (ref 15–500)
Eosinophils Relative: 0.5 %
HCT: 42.2 % (ref 35.0–45.0)
Hemoglobin: 14 g/dL (ref 11.7–15.5)
Lymphs Abs: 1018 cells/uL (ref 850–3900)
MCH: 30.6 pg (ref 27.0–33.0)
MCHC: 33.2 g/dL (ref 32.0–36.0)
MCV: 92.3 fL (ref 80.0–100.0)
MPV: 13.3 fL — ABNORMAL HIGH (ref 7.5–12.5)
Monocytes Relative: 8.2 %
Neutro Abs: 6882 cells/uL (ref 1500–7800)
Neutrophils Relative %: 79.1 %
Platelets: 210 10*3/uL (ref 140–400)
RBC: 4.57 10*6/uL (ref 3.80–5.10)
RDW: 13.2 % (ref 11.0–15.0)
Total Lymphocyte: 11.7 %
WBC: 8.7 10*3/uL (ref 3.8–10.8)

## 2018-09-02 LAB — LIPID PANEL
Cholesterol: 148 mg/dL (ref <200)
HDL: 45 mg/dL — ABNORMAL LOW (ref >=50)
LDL Cholesterol (Calc): 80 mg/dL (calc)
Non-HDL Cholesterol (Calc): 103 mg/dL (calc) (ref <130)
Total CHOL/HDL Ratio: 3.3 (calc) (ref <5.0)
Triglycerides: 126 mg/dL (ref <150)

## 2018-09-02 LAB — VITAMIN D 25 HYDROXY (VIT D DEFICIENCY, FRACTURES): Vit D, 25-Hydroxy: 80 ng/mL (ref 30–100)

## 2018-09-02 LAB — MAGNESIUM: Magnesium: 2 mg/dL (ref 1.5–2.5)

## 2018-09-02 LAB — TSH: TSH: 2.38 mIU/L (ref 0.40–4.50)

## 2018-09-19 NOTE — Progress Notes (Signed)
History of Present Illness:        This very nice 78 y.o. DWF with  HTN, cAfib,  HLD,  Prediabetes  and Vitamin D Deficiency who also has hx/o  Brain surgery for an Aneurysm (2001) and presents now with 6-12 month hx/o bilat leg pain & weakness. She did have lumbar X-rays in April last year & was treated with prednisone taper with some improvement. Then in July, she had a lumbar MRI showing  Moderate L3-L4 spinal stenosis and DDD and apparently was lost to f/u. Patient relates her sx's have progressively gotten worse now requiring a walker to support her balance. She denies bowel or bladder issues.             Patient has pertinent hx/o hospitalization in Nov 2019 with Afib and PE and severe anemia (Hgb 5 gm%) requiring 4 units of packed red blood cells total.  She under went EGD that shoed esophagitis and erosive gastropathy, colonoscopy had 6 mm polyp removed. She was discharged on Protonix 2 x a day for 1 month and then daily long term. She is on oral iron. Patient's  EGD showed esophagitis and erosive gastropathy and  colonoscopy had 6 mm polyp. She was discharged on Protonix,oral iron and also started on  Eliquis.    Medications  .  metoprolol tartrate (LOPRESSOR) 25 MG tablet, Take one tablet daily .  acetaminophen (TYLENOL) 325 MG tablet, Take 2 tablets (650 mg total) by mouth every 6 (six) hours as needed for mild pain (or Fever >/= 101). Marland Kitchen  apixaban (ELIQUIS) 5 MG TABS tablet, Take 1 tablet 2 x /day for A Fib  .  Cholecalciferol 25 MCG (1000 UT) capsule, Take 1,000 Units by mouth daily. Takes variable doses, 2-6 daily .  diazepam (VALIUM) 5 MG tablet, Take 1/2 to 1 tab tid prn for anxiety or VERTIGO .  Misc Natural Products (T-RELIEF CBD+13 SL), Place under the tongue. Takes CBD oil twice a day .  Multiple Vitamins-Minerals (CENTRUM SILVER 50+WOMEN PO), Take 1 tablet by mouth daily.  .  pantoprazole (PROTONIX) 40 MG tablet, One pill daily to heal your stomach and prevent bleeding .   pneumococcal 13-valent conjugate vaccine (PREVNAR 13) injection 0.5 mL  Problem list She has Abnormal glucose; Vitamin D deficiency; Asthma; Essential hypertension; Migraines; Hyperlipidemia, mixed; Medication management; Vertigo due to brain injury [facility]); Obesity (BMI 30.0-34.9); Atrial fibrillation ([place]); Aortic aneurysm ([place]); Senile purpura ([place]); History of pulmonary embolism (01/2018); Gastroesophageal reflux disease with esophagitis; and Varicose vein of leg on their problem list.   Observations/Objective:  BP 110/70   Pulse 64   Temp (!) 97.2 F (36.2 C)   Resp 16   Ht 5\' 4"  (1.626 m)   Wt 174 lb (78.9 kg)   BMI 29.87 kg/m   HEENT - WNL. Neck - supple.  Chest - Clear equal BS. Cor - Nl HS. RRR w/o sig MGR. PP 1(+). No edema. MS- Generalized decrease in muscle power, tone & bulk. FROM w/o deformities.  Hip ROM's Nl.  Broad based Gait supported with a walker Neuro -  CN - WNL. Slight decreased pinprick & vibratory perception to the distal Rt leg relative to the Right. DTR's - LE absent. moderate difficulty arising to standing from sitting position due to weakness & pain.   Assessment and Plan:  1. Spinal stenosis of lumbar region, unspecified whether neurogenic claudication present  - MR Lumbar Spine Wo Contrast; Future  2. DDD (degenerative disc disease), lumbar  -  MR Lumbar Spine Wo Contrast; Future  3. Bilateral leg weakness  - predniSONE (DELTASONE) 20 MG tablet; 1 tab 3 x day for 3 days, then 1 tab 2 x day for 3 days, then 1 tab 1 x day for 5 days  Dispense: 20 tablet  - MR Lumbar Spine Wo Contrast; Future  Follow Up Instructions: pending MRI result , anticipate referral to a spinal surgeon       I discussed the assessment and treatment plan with the patient. The patient was provided an opportunity to ask questions and all were answered. The patient agreed with the plan and demonstrated an understanding of the instructions. About 28 minutes of exam, counseling,  chart review, and critical decision making was performed   The patient was advised to call back or seek an in-person evaluation if the symptoms worsen or if the condition fails to improve as anticipated.   Kirtland Bouchard, MD

## 2018-09-20 ENCOUNTER — Ambulatory Visit (INDEPENDENT_AMBULATORY_CARE_PROVIDER_SITE_OTHER): Payer: PPO | Admitting: Internal Medicine

## 2018-09-20 ENCOUNTER — Other Ambulatory Visit: Payer: Self-pay

## 2018-09-20 ENCOUNTER — Encounter: Payer: Self-pay | Admitting: Internal Medicine

## 2018-09-20 VITALS — BP 110/70 | HR 64 | Temp 97.2°F | Resp 16 | Ht 64.0 in | Wt 174.0 lb

## 2018-09-20 DIAGNOSIS — M5136 Other intervertebral disc degeneration, lumbar region: Secondary | ICD-10-CM | POA: Diagnosis not present

## 2018-09-20 DIAGNOSIS — R29898 Other symptoms and signs involving the musculoskeletal system: Secondary | ICD-10-CM

## 2018-09-20 DIAGNOSIS — M48061 Spinal stenosis, lumbar region without neurogenic claudication: Secondary | ICD-10-CM | POA: Diagnosis not present

## 2018-09-20 MED ORDER — PREDNISONE 20 MG PO TABS: ORAL_TABLET | ORAL | 0 refills | Status: DC

## 2018-10-03 ENCOUNTER — Other Ambulatory Visit: Payer: Self-pay | Admitting: Physician Assistant

## 2018-10-10 ENCOUNTER — Other Ambulatory Visit: Payer: Self-pay | Admitting: Physician Assistant

## 2018-10-10 ENCOUNTER — Other Ambulatory Visit: Payer: Self-pay | Admitting: Internal Medicine

## 2018-10-10 DIAGNOSIS — R42 Dizziness and giddiness: Secondary | ICD-10-CM

## 2018-10-10 DIAGNOSIS — D649 Anemia, unspecified: Secondary | ICD-10-CM

## 2018-10-13 ENCOUNTER — Telehealth: Payer: Self-pay | Admitting: *Deleted

## 2018-10-13 NOTE — Telephone Encounter (Signed)
Patient called and states she thinks she is missing one of her medications, but she is not sure which one. According to the tech at [facility], it appears she bought all the medications sent to their pharmacy. The tech volunteered to help the patient with the RX's, if she would bring all her medications to the pharmacy. A message was left to inform the patient.  Also, the patient asked about a medication for pain.  Per Dr Melford Aase, the patient can try 2 Ibuprofen 200 mg tablets along with 2 tylenol 500 mg 3 to 4 times a day, as needed.

## 2018-10-15 ENCOUNTER — Ambulatory Visit
Admission: RE | Admit: 2018-10-15 | Discharge: 2018-10-15 | Disposition: A | Discharge status: Home / Self Care | Payer: PPO | Source: Ambulatory Visit | Attending: Internal Medicine | Admitting: Internal Medicine

## 2018-10-15 ENCOUNTER — Other Ambulatory Visit: Payer: Self-pay | Admitting: Internal Medicine

## 2018-10-15 ENCOUNTER — Other Ambulatory Visit: Payer: Self-pay

## 2018-10-15 DIAGNOSIS — M48061 Spinal stenosis, lumbar region without neurogenic claudication: Secondary | ICD-10-CM

## 2018-10-15 DIAGNOSIS — R29898 Other symptoms and signs involving the musculoskeletal system: Secondary | ICD-10-CM

## 2018-10-15 DIAGNOSIS — G834 Cauda equina syndrome: Secondary | ICD-10-CM

## 2018-10-15 DIAGNOSIS — M5136 Other intervertebral disc degeneration, lumbar region: Secondary | ICD-10-CM

## 2018-10-21 DIAGNOSIS — M48062 Spinal stenosis, lumbar region with neurogenic claudication: Secondary | ICD-10-CM | POA: Diagnosis not present

## 2018-10-27 DIAGNOSIS — M5116 Intervertebral disc disorders with radiculopathy, lumbar region: Secondary | ICD-10-CM | POA: Diagnosis not present

## 2018-10-27 DIAGNOSIS — M5416 Radiculopathy, lumbar region: Secondary | ICD-10-CM | POA: Diagnosis not present

## 2018-11-02 DIAGNOSIS — M545 Low back pain: Secondary | ICD-10-CM | POA: Diagnosis not present

## 2018-11-02 DIAGNOSIS — M48062 Spinal stenosis, lumbar region with neurogenic claudication: Secondary | ICD-10-CM | POA: Diagnosis not present

## 2018-11-02 DIAGNOSIS — R262 Difficulty in walking, not elsewhere classified: Secondary | ICD-10-CM | POA: Diagnosis not present

## 2018-11-02 DIAGNOSIS — R531 Weakness: Secondary | ICD-10-CM | POA: Diagnosis not present

## 2018-11-07 DIAGNOSIS — M48062 Spinal stenosis, lumbar region with neurogenic claudication: Secondary | ICD-10-CM | POA: Diagnosis not present

## 2018-11-07 DIAGNOSIS — R531 Weakness: Secondary | ICD-10-CM | POA: Diagnosis not present

## 2018-11-07 DIAGNOSIS — M545 Low back pain: Secondary | ICD-10-CM | POA: Diagnosis not present

## 2018-11-07 DIAGNOSIS — R262 Difficulty in walking, not elsewhere classified: Secondary | ICD-10-CM | POA: Diagnosis not present

## 2018-11-15 DIAGNOSIS — M545 Low back pain: Secondary | ICD-10-CM | POA: Diagnosis not present

## 2018-11-15 DIAGNOSIS — M48062 Spinal stenosis, lumbar region with neurogenic claudication: Secondary | ICD-10-CM | POA: Diagnosis not present

## 2018-11-15 DIAGNOSIS — R531 Weakness: Secondary | ICD-10-CM | POA: Diagnosis not present

## 2018-11-15 DIAGNOSIS — R262 Difficulty in walking, not elsewhere classified: Secondary | ICD-10-CM | POA: Diagnosis not present

## 2018-11-24 DIAGNOSIS — M545 Low back pain: Secondary | ICD-10-CM | POA: Diagnosis not present

## 2018-11-24 DIAGNOSIS — R262 Difficulty in walking, not elsewhere classified: Secondary | ICD-10-CM | POA: Diagnosis not present

## 2018-11-24 DIAGNOSIS — R531 Weakness: Secondary | ICD-10-CM | POA: Diagnosis not present

## 2018-11-24 DIAGNOSIS — M48062 Spinal stenosis, lumbar region with neurogenic claudication: Secondary | ICD-10-CM | POA: Diagnosis not present

## 2018-12-02 DIAGNOSIS — M48062 Spinal stenosis, lumbar region with neurogenic claudication: Secondary | ICD-10-CM | POA: Diagnosis not present

## 2018-12-04 ENCOUNTER — Encounter: Payer: Self-pay | Admitting: Internal Medicine

## 2018-12-04 NOTE — Patient Instructions (Signed)
Vit D  & Vit C 1,000 mg   are recommended to help protect  against the Covid-19 and other Corona viruses.    Also it's recommended  to take  Zinc 50 mg  to help  protect against the Covid-19   and best place to get  is also on [employer]  and don't pay more than 6-8 cents /pill !   ===================================== Coronavirus (COVID-19) Are you at risk?  Are you at risk for the Coronavirus (COVID-19)?  To be considered HIGH RISK for Coronavirus (COVID-19), you have to meet the following criteria:  . Traveled to [place], [place], [place], [place] or [place]; or in the [place] to [place], [place], [place]  . or [place]; and have fever, cough, and shortness of breath within the last 2 weeks of travel OR . Been in close contact with a person diagnosed with COVID-19 within the last 2 weeks and have  . fever, cough,and shortness of breath .  . IF YOU DO NOT MEET THESE CRITERIA, YOU ARE CONSIDERED LOW RISK FOR COVID-19.  What to do if you are HIGH RISK for COVID-19?  . If you are having a medical emergency, call 911. . Seek medical care right away. Before you go to a doctor's office, urgent care or emergency department, .  call ahead and tell them about your recent travel, contact with someone diagnosed with COVID-19  .  and your symptoms.  . You should receive instructions from your physician's office regarding next steps of care.  . When you arrive at healthcare provider, tell the healthcare staff immediately you have returned from  . visiting [place], [place], [place], [place] or [place]; or traveled in the [place] to [place], [place],  . [place] or [place] in the last two weeks or you have been in close contact with a person diagnosed with  . COVID-19 in the last 2 weeks.   . Tell the health care staff about your symptoms: fever, cough and shortness of breath. . After you have been seen by a medical provider, you will be either: o Tested for  (COVID-19) and discharged home on quarantine except to seek medical care if  o symptoms worsen, and asked to  - Stay home and avoid contact with others until you get your results (4-5 days)  - Avoid travel on public transportation if possible (such as bus, train, or airplane) or o Sent to the Emergency Department by EMS for evaluation, COVID-19 testing  and  o possible admission depending on your condition and test results.  What to do if you are LOW RISK for COVID-19?  Reduce your risk of any infection by using the same precautions used for avoiding the common cold or flu:  . Wash your hands often with soap and warm water for at least 20 seconds.  If soap and water are not readily available,  . use an alcohol-based hand sanitizer with at least 60% alcohol.  . If coughing or sneezing, cover your mouth and nose by coughing or sneezing into the elbow areas of your shirt or coat, .  into a tissue or into your sleeve (not your hands). . Avoid shaking hands with others and consider head nods or verbal greetings only. . Avoid touching your eyes, nose, or mouth with unwashed hands.  . Avoid close contact with people who are sick. . Avoid places or events with large numbers of people in one location, like concerts or sporting events. .   Carefully consider travel plans you have or are making. . If you are planning any travel outside or inside the [place], visit the CDC's Travelers' Health webpage for the latest health notices. . If you have some symptoms but not all symptoms, continue to monitor at home and seek medical attention  . if your symptoms worsen. . If you are having a medical emergency, call 911.   ++++++++++++++++++++++++++++++++ Recommend Adult Low Dose Aspirin or  coated  Aspirin 81 mg daily  To reduce risk of Colon Cancer 40 %,  Skin Cancer 26 % ,  Melanoma 46%  and  Pancreatic cancer 60% ++++++++++++++++++++++++++++++++ Vitamin D goal  is between 70-100.  Please make sure that  you are taking your Vitamin D as directed.  It is very important as a natural anti-inflammatory  helping hair, skin, and nails, as well as reducing stroke and heart attack risk.  It helps your bones and helps with mood. It also decreases numerous cancer risks so please take it as directed.  Low Vit D is associated with a 200-300% higher risk for CANCER  and 200-300% higher risk for HEART   ATTACK  &  STROKE.   ...................................... It is also associated with higher death rate at younger ages,  autoimmune diseases like Rheumatoid arthritis, Lupus, Multiple Sclerosis.    Also many other serious conditions, like depression, Alzheimer's Dementia, infertility, muscle aches, fatigue, fibromyalgia - just to name a few. ++++++++++++++++++++ Recommend the book "The END of DIETING" by Dr Joel Fuhrman  & the book "The END of DIABETES " by Dr Joel Fuhrman At [employer] - get book & Audio CD's    Being diabetic has a  300% increased risk for heart attack, stroke, cancer, and alzheimer- type vascular dementia. It is very important that you work harder with diet by avoiding all foods that are white. Avoid white rice (brown & wild rice is OK), white potatoes (sweetpotatoes in moderation is OK), White bread or wheat bread or anything made out of white flour like bagels, donuts, rolls, buns, biscuits, cakes, pastries, cookies, pizza crust, and pasta (made from white flour & egg whites) - vegetarian pasta or spinach or wheat pasta is OK. Multigrain breads like Arnold's or Pepperidge [employer], or multigrain sandwich thins or flatbreads.  Diet, exercise and weight loss can reverse and cure diabetes in the early stages.  Diet, exercise and weight loss is very important in the control and prevention of complications of diabetes which affects every system in your body, ie. Brain - dementia/stroke, eyes - glaucoma/blindness, heart - heart attack/heart failure, kidneys - dialysis, stomach - gastric paralysis,  intestines - malabsorption, nerves - severe painful neuritis, circulation - gangrene & loss of a leg(s), and finally cancer and Alzheimers.    I recommend avoid fried & greasy foods,  sweets/candy, white rice (brown or wild rice or Quinoa is OK), white potatoes (sweet potatoes are OK) - anything made from white flour - bagels, doughnuts, rolls, buns, biscuits,white and wheat breads, pizza crust and traditional pasta made of white flour & egg white(vegetarian pasta or spinach or wheat pasta is OK).  Multi-grain bread is OK - like multi-grain flat bread or sandwich thins. Avoid alcohol in excess. Exercise is also important.    Eat all the vegetables you want - avoid meat, especially red meat and dairy - especially cheese.  Cheese is the most concentrated form of trans-fats which is the worst thing to clog up our arteries. Veggie cheese is OK which can be   found in the fresh produce section at [employer] or [employer] or [employer]  +++++++++++++++++++++ DASH Eating Plan  DASH stands for "Dietary Approaches to Stop Hypertension."   The DASH eating plan is a healthy eating plan that has been shown to reduce high blood pressure (hypertension). Additional health benefits may include reducing the risk of type 2 diabetes mellitus, heart disease, and stroke. The DASH eating plan may also help with weight loss. WHAT DO I NEED TO KNOW ABOUT THE DASH EATING PLAN? For the DASH eating plan, you will follow these general guidelines:  Choose foods with a percent daily value for sodium of less than 5% (as listed on the food label).  Use salt-free seasonings or herbs instead of table salt or sea salt.  Check with your health care provider or pharmacist before using salt substitutes.  Eat lower-sodium products, often labeled as "lower sodium" or "no salt added."  Eat fresh foods.  Eat more vegetables, fruits, and low-fat dairy products.  Choose whole grains. Look for the word "whole" as the first word in  the ingredient list.  Choose fish   Limit sweets, desserts, sugars, and sugary drinks.  Choose heart-healthy fats.  Eat veggie cheese   Eat more home-cooked food and less restaurant, buffet, and fast food.  Limit fried foods.  Cook foods using methods other than frying.  Limit canned vegetables. If you do use them, rinse them well to decrease the sodium.  When eating at a restaurant, ask that your food be prepared with less salt, or no salt if possible.                      WHAT FOODS CAN I EAT? Read Dr Joel Fuhrman's books on The End of Dieting & The End of Diabetes  Grains Whole grain or whole wheat bread. Brown rice. Whole grain or whole wheat pasta. Quinoa, bulgur, and whole grain cereals. Low-sodium cereals. Corn or whole wheat flour tortillas. Whole grain cornbread. Whole grain crackers. Low-sodium crackers.  Vegetables Fresh or frozen vegetables (raw, steamed, roasted, or grilled). Low-sodium or reduced-sodium tomato and vegetable juices. Low-sodium or reduced-sodium tomato sauce and paste. Low-sodium or reduced-sodium canned vegetables.   Fruits All fresh, canned (in natural juice), or frozen fruits.  Protein Products  All fish and seafood.  Dried beans, peas, or lentils. Unsalted nuts and seeds. Unsalted canned beans.  Dairy Low-fat dairy products, such as skim or 1% milk, 2% or reduced-fat cheeses, low-fat ricotta or cottage cheese, or plain low-fat yogurt. Low-sodium or reduced-sodium cheeses.  Fats and Oils Tub margarines without trans fats. Light or reduced-fat mayonnaise and salad dressings (reduced sodium). Avocado. Safflower, olive, or canola oils. Natural peanut or almond butter.  Other Unsalted popcorn and pretzels. The items listed above may not be a complete list of recommended foods or beverages. Contact your dietitian for more options.  +++++++++++++++  WHAT FOODS ARE NOT RECOMMENDED? Grains/ White flour or wheat flour White bread. White pasta.  White rice. Refined cornbread. Bagels and croissants. Crackers that contain trans fat.  Vegetables  Creamed or fried vegetables. Vegetables in a . Regular canned vegetables. Regular canned tomato sauce and paste. Regular tomato and vegetable juices.  Fruits Dried fruits. Canned fruit in light or heavy syrup. Fruit juice.  Meat and Other Protein Products Meat in general - RED meat & White meat.  Fatty cuts of meat. Ribs, chicken wings, all processed meats as bacon, sausage, bologna, salami, fatback, hot dogs, bratwurst and packaged   luncheon meats.  Dairy Whole or 2% milk, cream, half-and-half, and cream cheese. Whole-fat or sweetened yogurt. Full-fat cheeses or blue cheese. Non-dairy creamers and whipped toppings. Processed cheese, cheese spreads, or cheese curds.  Condiments Onion and garlic salt, seasoned salt, table salt, and sea salt. Canned and packaged gravies. Worcestershire sauce. Tartar sauce. Barbecue sauce. Teriyaki sauce. Soy sauce, including reduced sodium. Steak sauce. Fish sauce. Oyster sauce. Cocktail sauce. Horseradish. Ketchup and mustard. Meat flavorings and tenderizers. Bouillon cubes. Hot sauce. Tabasco sauce. Marinades. Taco seasonings. Relishes.  Fats and Oils Butter, stick margarine, lard, shortening and bacon fat. Coconut, palm kernel, or palm oils. Regular salad dressings.  Pickles and olives. Salted popcorn and pretzels.  The items listed above may not be a complete list of foods and beverages to avoid.  ++++++++++++++++++++++++++++++++++  Bleeding Precautions When on Anticoagulant Therapy  Anticoagulant therapy, also called blood thinner therapy, is medicine that helps to prevent and treat blood clots. The medicine works by stopping blood clots from forming or growing. Blood clots that form in your blood vessels can be dangerous. They can break loose and travel to the heart, lungs, or brain. This increases the risk of a heart attack, stroke, or blocked lung  artery (pulmonary embolism). Anticoagulants also increase the risk of bleeding. Try to protect yourself from cuts and other injuries that can cause bleeding. It is important to take anticoagulants exactly as told by your health care provider.  Why do I need to be on anticoagulant therapy? You may need this medicine if you are at risk of developing a blood clot. Conditions that increase your risk of a blood clot include:  Being born with heart disease or a heart malformation (congenital heart disease).  Developing heart disease.  Having had surgery, such as valve replacement.  Having had a serious accident or other type of severe injury (trauma).  Having certain types of cancer.  Having certain diseases that can increase blood clotting.  Having a high risk of stroke or heart attack.  Having atrial fibrillation (AF).   What are the common anticoagulant medicines? There are several types of anticoagulant medicines. The most common types are:  Medicines that you take by mouth (oral medicines), such as: ?  ? Novel oral anticoagulants (NOACs), such as: ? Direct thrombin inhibitors (dabigatran). ? Factor Xa inhibitors (apixaban, edoxaban, and rivaroxaban).  Injections, such as: ?  These anticoagulants work in different ways to prevent blood clots. They also have different risks and side effects  What do I need to remember while on anticoagulant therapy?  Taking anticoagulants   Take your medicine at the same time every day. If you forget to take your medicine, take it as soon as you remember. Do not double your dosage of medicine if you miss a whole day. Take your normal dose and call your health care provider.  Do not stop taking your medicine unless your health care provider approves. Stopping the medicine can increase your risk of developing a blood clot.   Taking other medicines   Take over-the-counter and prescriptions medicines only as told by your health care  provider.  Do not take over-the-counter NSAIDs, including aspirin and ibuprofen, while you are on anticoagulant therapy. These medicines increase your risk of dangerous bleeding.  Get approval from your health care provider before you start taking any new medicines, vitamins, or herbal products. Some of these could interfere with your therapy.   General instructions   Keep all follow-up visits as told by your  health care provider. This is important.  If you are pregnant or trying to get pregnant, talk with a health care provider about anticoagulants. Some of these medicines are not safe to take during pregnancy.  Tell all health care providers, including your dentist, that you are on anticoagulant therapy. It is especially important to tell providers before you have any surgery, medical procedures, or dental work done.   What precautions should I take?   Be very careful when using knives, scissors, or other sharp objects.  Use an electric razor instead of a blade.  Do not use toothpicks.  Use a soft-bristled toothbrush. Brush your teeth gently.  Always wear shoes outdoors and wear slippers indoors.  Be careful when cutting your fingernails and toenails.  Place bath mats in the bathroom. If possible, install handrails as well.  Wear gloves while you do yard work.  Wear your seat belt.  Prevent falls by removing loose rugs and extension cords from areas where you walk. Use a cane or walker if you need it.  Avoid constipation by: ? Drinking enough fluid to keep your urine clear or pale yellow. ? Eating foods that are high in fiber, such as fresh fruits and vegetables, whole grains, and beans. ? Limiting foods that are high in fat and processed sugars, such as fried and sweet foods.  Do not play contact sports or participate in other activities that have a high risk for injury.  What are some questions to ask my health care provider?   Why do I need anticoagulant  therapy?  What is the best anticoagulant therapy for my condition?  How long will I need anticoagulant therapy?  What are the side effects of anticoagulant therapy?  When should I take my medicine? What should I do if I forget to take it?  Will I need to have regular blood tests?  Do I need to change my diet? Are there foods or drinks that I should avoid?  What activities are safe for me?   Contact a health care provider if:   You miss a dose of medicine: ? And you are not sure what to do. ? For more than one day.  You have: ? Menstrual bleeding that is heavier than normal. ? Bloody or brown urine. ? Easy bruising. ? Black and tarry stool or bright red stool. ? Side effects from your medicine.  You feel weak or dizzy.   Get help right away if:  You have bleeding that will not stop within 20 minutes from: ? The nose. ? The gums. ? A cut on the skin.  You have a severe headache or stomachache.  You vomit or cough up blood.  You fall or hit your head.   Summary   Anticoagulant therapy, also called blood thinner therapy, is medicine that helps to prevent and treat blood clots.  Anticoagulants work in different ways to prevent blood clots. They also have different risks and side effects.  Talk with your health care provider about any precautions that you should take while on anticoagulant therapy.

## 2018-12-04 NOTE — Progress Notes (Signed)
History of Present Illness:      This very nice 78 y.o.  DWF presents for 6 month follow up with HTN, cAfib, HLD, Pre-Diabetes and Vitamin D Deficiency.       In August 2020, Lumbar MRI showed severe spinal stenosis & she was referred to Neurosurgery - Dr Ellene Route, who recommended initial conservative approach with EDSI's.       Patient was hospitalized in Nov 2019 with Afib, PE & severe Anemia requiring 4 u pRBC Tx. EGD found severe esophagitis/gastritis & Colonoscopy found a small 6 mm polyp. She was discharged on Protonix, oral iron & Eliquis.       Patient is treated for HTN (2009) & BP has been controlled at home. Today's BP is at goal - 120/76. In 2009 she had a False (+) Myoview and a Negative Heart Cath.  Patient has had no complaints of any cardiac type chest pain, palpitations, dyspnea / orthopnea / PND, dizziness, claudication, or dependent edema.      Hyperlipidemia is controlled with diet & meds. Patient denies myalgias or other med SE's. Last Lipids were at goal:  Lab Results  Component Value Date   CHOL 148 09/01/2018   HDL 45 (L) 09/01/2018   LDLCALC 80 09/01/2018   TRIG 126 09/01/2018   CHOLHDL 3.3 09/01/2018        Also, the patient has history of PreDiabetes (A1c 5.9% / 2013)and has had no symptoms of reactive hypoglycemia, diabetic polys, paresthesias or visual blurring.  Last A1c was Normal & at goal:  Lab Results  Component Value Date   HGBA1C 5.4 09/01/2018           Further, the patient also has history of Vitamin D Deficiency ("11" / 2008) and supplements vitamin D without any suspected side-effects. Last vitamin D was at goal:  Lab Results  Component Value Date   VD25OH 95 09/01/2018    Current Outpatient Medications on File Prior to Visit  Medication Sig  . acetaminophen (TYLENOL) 325 MG tablet Take 2 tablets (650 mg total) by mouth every 6 (six) hours as needed for mild pain (or Fever >/= 101).  Marland Kitchen apixaban (ELIQUIS) 5 MG TABS tablet Take 1  tablet 2 x /day for Afib & to Prevent Blood Clots  . Cholecalciferol 25 MCG (1000 UT) capsule Take 1,000 Units by mouth daily. Takes variable doses, 2-6 daily  . diazepam (VALIUM) 5 MG tablet Take 1/2 to 1 tablet 2 to 3 x /day for Dizziness / Vertigo  . ferrous sulfate 325 (65 FE) MG EC tablet Take 325 mg by mouth daily.  . metoprolol tartrate (LOPRESSOR) 25 MG tablet TAKE 1 TABLET BY MOUTH DAILY  . Misc Natural Products (T-RELIEF CBD+13 SL) Place under the tongue. Takes CBD oil twice a day  . Multiple Vitamins-Minerals (CENTRUM SILVER 50+WOMEN PO) Take 1 tablet by mouth daily.   Marland Kitchen OVER THE COUNTER MEDICATION Takes OTC Aleve occasionally for pain.  . pantoprazole (PROTONIX) 40 MG tablet Take 1 tablet Daily for Indigestion & Bleeding Ulcers  . Turmeric 500 MG TABS Take 1 tablet by mouth daily.   No Known Allergies  PMHx:   Past Medical History:  Diagnosis Date  . Arthritis   . Asthma   . Benign neoplasm of sigmoid colon   . Hyperlipidemia   . Hypertension   . Migraines   . Pericardial effusion 02/04/2018  . Prediabetes   . Vertigo   . Vitamin D deficiency  Immunization History  Administered Date(s) Administered  . DT 01/24/2014  . Pneumococcal Conjugate-13 05/26/2018  . Pneumococcal Polysaccharide-23 10/06/2011  . Td 04/23/2003   Past Surgical History:  Procedure Laterality Date  . BIOPSY  02/05/2018   Procedure: BIOPSY;  Surgeon: Ladene Artist, MD;  Location: [facility];  Service: Endoscopy;;  . CATARACT EXTRACTION W/ INTRAOCULAR LENS  IMPLANT, BILATERAL  2009  . CEREBRAL ANEURYSM REPAIR    . COLONOSCOPY N/A 02/05/2018   Procedure: COLONOSCOPY;  Surgeon: Ladene Artist, MD;  Location: [facility];  Service: Endoscopy;  Laterality: N/A;  . ESOPHAGOGASTRODUODENOSCOPY N/A 02/05/2018   Procedure: ESOPHAGOGASTRODUODENOSCOPY (EGD);  Surgeon: Ladene Artist, MD;  Location: [facility];  Service: Endoscopy;  Laterality: N/A;  . FOOT SURGERY  1993  . POLYPECTOMY   02/05/2018   Procedure: POLYPECTOMY;  Surgeon: Ladene Artist, MD;  Location: [facility];  Service: Endoscopy;;  . TUBAL LIGATION     FHx:    Reviewed / unchanged  SHx:    Reviewed / unchanged   Systems Review:  Constitutional: Denies fever, chills, wt changes, headaches, insomnia, fatigue, night sweats, change in appetite. Eyes: Denies redness, blurred vision, diplopia, discharge, itchy, watery eyes.  ENT: Denies discharge, congestion, post nasal drip, epistaxis, sore throat, earache, hearing loss, dental pain, tinnitus, vertigo, sinus pain, snoring.  CV: Denies chest pain, palpitations, irregular heartbeat, syncope, dyspnea, diaphoresis, orthopnea, PND, claudication or edema. Respiratory: denies cough, dyspnea, DOE, pleurisy, hoarseness, laryngitis, wheezing.  Gastrointestinal: Denies dysphagia, odynophagia, heartburn, reflux, water brash, abdominal pain or cramps, nausea, vomiting, bloating, diarrhea, constipation, hematemesis, melena, hematochezia  or hemorrhoids. Genitourinary: Denies dysuria, frequency, urgency, nocturia, hesitancy, discharge, hematuria or flank pain. Musculoskeletal: Denies arthralgias, myalgias, stiffness, jt. swelling, pain, limping or strain/sprain.  Skin: Denies pruritus, rash, hives, warts, acne, eczema or change in skin lesion(s). Neuro: No weakness, tremor, incoordination, spasms, paresthesia or pain. Psychiatric: Denies confusion, memory loss or sensory loss. Endo: Denies change in weight, skin or hair change.  Heme/Lymph: No excessive bleeding, bruising or enlarged lymph nodes.  Physical Exam  BP 120/76   Pulse 60   Temp (!) 97 F (36.1 C)   Ht 5\' 4"  (1.626 m)   Wt 170 lb (77.1 kg)   SpO2 98%   BMI 29.18 kg/m   Appears  well nourished, well groomed  and in no distress.  Eyes: PERRLA, EOMs, conjunctiva no swelling or erythema. Sinuses: No frontal/maxillary tenderness ENT/Mouth: EAC's clear, TM's nl w/o erythema, bulging. Nares clear w/o  erythema, swelling, exudates. Oropharynx clear without erythema or exudates. Oral hygiene is good. Tongue normal, non obstructing. Hearing intact.  Neck: Supple. Thyroid not palpable. Car 2+/2+ without bruits, nodes or JVD. Chest: Respirations nl with BS clear & equal w/o rales, rhonchi, wheezing or stridor.  Cor: Heart sounds normal w/ regular rate and rhythm without sig. murmurs, gallops, clicks or rubs. Peripheral pulses normal and equal  without edema.  Abdomen: Soft & bowel sounds normal. Non-tender w/o guarding, rebound, hernias, masses or organomegaly.  Lymphatics: Unremarkable.  Musculoskeletal: Full ROM all peripheral extremities, joint stability, 5/5 strength and normal gait.  Skin: Warm, dry without exposed rashes, lesions or ecchymosis apparent.  Neuro: Cranial nerves intact, reflexes equal bilaterally. Sensory-motor testing grossly intact. Tendon reflexes grossly intact.  Pysch: Alert & oriented x 3.  Insight and judgement nl & appropriate. No ideations.  Assessment and Plan:  1. Essential hypertension  - Continue medication, monitor blood pressure at home.  - Continue DASH diet.  Reminder to go  to the ER if any CP,  SOB, nausea, dizziness, severe HA, changes vision/speech.  - CBC with Differential/Platelet - COMPLETE METABOLIC PANEL WITH GFR - Magnesium - TSH  2. Hyperlipidemia, mixed  - Continue diet/meds, exercise,& lifestyle modifications.  - Continue monitor periodic cholesterol/liver & renal functions   - Lipid panel - TSH  3. Abnormal glucose  - Hemoglobin A1c - Insulin, random  4. Vitamin D deficiency  - Continue diet, exercise  - Lifestyle modifications.  - Monitor appropriate labs. - Continue supplementation.  - VITAMIN D 25 Hydroxyl  5. Paroxysmal atrial fibrillation ([name])  - TSH  6. Medication management  - CBC with Differential/Platelet - COMPLETE METABOLIC PANEL WITH GFR - Magnesium - Lipid panel - TSH - Hemoglobin A1c - Insulin,  random - VITAMIN D 25 Hydroxyl        Discussed  regular exercise, BP monitoring, weight control to achieve/maintain BMI less than 25 and discussed med and SE's. Recommended labs to assess and monitor clinical status with further disposition pending results of labs.  I discussed the assessment and treatment plan with the patient. The patient was provided an opportunity to ask questions and all were answered. The patient agreed with the plan and demonstrated an understanding of the instructions.  I provided over 30 minutes of exam, counseling, chart review and  complex critical decision making.  Kirtland Bouchard, MD

## 2018-12-05 ENCOUNTER — Encounter: Payer: Self-pay | Admitting: Internal Medicine

## 2018-12-05 ENCOUNTER — Other Ambulatory Visit: Payer: Self-pay

## 2018-12-05 ENCOUNTER — Ambulatory Visit (INDEPENDENT_AMBULATORY_CARE_PROVIDER_SITE_OTHER): Payer: PPO | Admitting: Internal Medicine

## 2018-12-05 VITALS — BP 120/76 | HR 60 | Temp 97.0°F | Ht 64.0 in | Wt 170.0 lb

## 2018-12-05 DIAGNOSIS — E782 Mixed hyperlipidemia: Secondary | ICD-10-CM | POA: Diagnosis not present

## 2018-12-05 DIAGNOSIS — I10 Essential (primary) hypertension: Secondary | ICD-10-CM | POA: Diagnosis not present

## 2018-12-05 DIAGNOSIS — I480 Paroxysmal atrial fibrillation: Secondary | ICD-10-CM | POA: Diagnosis not present

## 2018-12-05 DIAGNOSIS — Z79899 Other long term (current) drug therapy: Secondary | ICD-10-CM | POA: Diagnosis not present

## 2018-12-05 DIAGNOSIS — E559 Vitamin D deficiency, unspecified: Secondary | ICD-10-CM | POA: Diagnosis not present

## 2018-12-05 DIAGNOSIS — R7309 Other abnormal glucose: Secondary | ICD-10-CM | POA: Diagnosis not present

## 2018-12-06 LAB — CBC WITH DIFFERENTIAL/PLATELET
Absolute Monocytes: 812 cells/uL (ref 200–950)
Basophils Absolute: 41 cells/uL (ref 0–200)
Basophils Relative: 0.5 %
Eosinophils Absolute: 41 cells/uL (ref 15–500)
Eosinophils Relative: 0.5 %
HCT: 40.4 % (ref 35.0–45.0)
Hemoglobin: 13.4 g/dL (ref 11.7–15.5)
Lymphs Abs: 1107 cells/uL (ref 850–3900)
MCH: 30.9 pg (ref 27.0–33.0)
MCHC: 33.2 g/dL (ref 32.0–36.0)
MCV: 93.1 fL (ref 80.0–100.0)
MPV: 11.4 fL (ref 7.5–12.5)
Monocytes Relative: 9.9 %
Neutro Abs: 6199 cells/uL (ref 1500–7800)
Neutrophils Relative %: 75.6 %
Platelets: 354 10*3/uL (ref 140–400)
RBC: 4.34 10*6/uL (ref 3.80–5.10)
RDW: 13.2 % (ref 11.0–15.0)
Total Lymphocyte: 13.5 %
WBC: 8.2 10*3/uL (ref 3.8–10.8)

## 2018-12-06 LAB — COMPLETE METABOLIC PANEL WITH GFR
AG Ratio: 1.7 (calc) (ref 1.0–2.5)
ALT: 10 U/L (ref 6–29)
AST: 16 U/L (ref 10–35)
Albumin: 3.8 g/dL (ref 3.6–5.1)
Alkaline phosphatase (APISO): 95 U/L (ref 37–153)
BUN: 23 mg/dL (ref 7–25)
CO2: 30 mmol/L (ref 20–32)
Calcium: 9.6 mg/dL (ref 8.6–10.4)
Chloride: 106 mmol/L (ref 98–110)
Creat: 0.83 mg/dL (ref 0.60–0.93)
GFR, Est African American: 79 mL/min/{1.73_m2} (ref >=60)
GFR, Est Non African American: 68 mL/min/{1.73_m2} (ref >=60)
Globulin: 2.2 g/dL (calc) (ref 1.9–3.7)
Glucose, Bld: 101 mg/dL — ABNORMAL HIGH (ref 65–99)
Potassium: 4.6 mmol/L (ref 3.5–5.3)
Sodium: 146 mmol/L (ref 135–146)
Total Bilirubin: 0.5 mg/dL (ref 0.2–1.2)
Total Protein: 6 g/dL — ABNORMAL LOW (ref 6.1–8.1)

## 2018-12-06 LAB — MAGNESIUM: Magnesium: 1.9 mg/dL (ref 1.5–2.5)

## 2018-12-06 LAB — HEMOGLOBIN A1C
Hgb A1c MFr Bld: 5.3 % of total Hgb (ref <5.7)
Mean Plasma Glucose: 105 (calc)
eAG (mmol/L): 5.8 (calc)

## 2018-12-06 LAB — VITAMIN D 25 HYDROXY (VIT D DEFICIENCY, FRACTURES): Vit D, 25-Hydroxy: 68 ng/mL (ref 30–100)

## 2018-12-06 LAB — LIPID PANEL
Cholesterol: 156 mg/dL (ref <200)
HDL: 45 mg/dL — ABNORMAL LOW (ref >=50)
LDL Cholesterol (Calc): 90 mg/dL (calc)
Non-HDL Cholesterol (Calc): 111 mg/dL (calc) (ref <130)
Total CHOL/HDL Ratio: 3.5 (calc) (ref <5.0)
Triglycerides: 112 mg/dL (ref <150)

## 2018-12-06 LAB — INSULIN, RANDOM: Insulin: 2 u[IU]/mL

## 2018-12-06 LAB — TSH: TSH: 2.41 mIU/L (ref 0.40–4.50)

## 2018-12-26 NOTE — Progress Notes (Signed)
Assessment and Plan:  Lori [name] was seen today for arthritis and urinary frequency.  Diagnoses and all orders for this visit:  Increased frequency of urination Increase water intake, stop liquids at least one hour prior to going to sleep. -     Urinalysis w microscopic + reflex cultur -     Urine Culture  Osteoarthritis of both hands Warm moist heat in mornings with mobility exercises May use tylenol 1,000mg  every 8 hours Do not exceed 3,000mg  in 24hour period -     diclofenac sodium (VOLTAREN) 1 % GEL; Apply 4 g topically 4 (four) times daily. Discussed further work up with possible RA.  Patient does not want testing or referral at this time related to current health costs.  Paroxysmal atrial fibrillation ([name]) Discussed importance of medication and proper dosing No concerns of bleeding, no falls Continue Eliquis 5mg  BID Contact Eliquis patient assistance for help with medication costs. Contact office if not able to get this medication affordably.   Further disposition pending results of labs. Discussed med's effects and SE's.   Over 30 minutes of interview, exam, counseling, chart review, and critical decision making was performed.   Future Appointments  Date Time Provider [place]  03/07/2019  3:30 PM [name], NP [facility] None  06/28/2019 10:00 AM Unk Pinto, MD [facility] None  10/02/2019 11:15 AM [name], NP [facility] None    ------------------------------------------------------------------------------------------------------------------   HPI Lori [name] presents for evaluation frequency of urination and pain in her hands.  She is accompanied with a friend today and has given verbal permission to discuss her healthcare with her around.   Urinary frequency started about two to three weeks ago.  She has been getting up every two hours or every hour to get up to go to the bathroom.   She takes her medication at 6:30pm then she goes to bed.   She mostly drinks water and reports she drinks a total of 6 cups of water a day.  She was having an upset stomach in the mornings, nausea.  Denies any emesis.  She started drinking ensure with her evening medications.  This has reduced her stomach upset.   She reports that her wrists and her hands have gradually been hurting more.  She has stiffness in the mornings that last 30 min or more.  She is able to move her hands but it is painful.  She reports it is hard for her to grasp or complete any fine motor activities until and hour or so after waking up.  She has been taking tylenol 1,000mg  once a day and reports this is not helping.  She had a steroid injection from Lori [name] approx two weeks ago?  She uses tramadol 50mg  prn, and uses this anywhere from 1 to 3 times a week.  She only uses this IF the tylenol or ibuprofen does not work.  She is taking Eliquis 5mg  BID for atrial fibrilation.  Reports that the price of the medication has skyrocketed and she is not able to afford this medication and wanting to take it once a day.  Discussed proper dosing BID, not candidate for 2.5mg  dosing, age, weight, kidney function does not qualify.   Past Medical History:  Diagnosis Date  . Arthritis   . Asthma   . Benign neoplasm of sigmoid colon   . Hyperlipidemia   . Hypertension   . Migraines   . Pericardial effusion 02/04/2018  . Prediabetes   . Vertigo   . Vitamin D  deficiency      No Known Allergies  Current Outpatient Medications on File Prior to Visit  Medication Sig  . acetaminophen (TYLENOL) 325 MG tablet Take 2 tablets (650 mg total) by mouth every 6 (six) hours as needed for mild pain (or Fever >/= 101).  Marland Kitchen apixaban (ELIQUIS) 5 MG TABS tablet Take 1 tablet 2 x /day for Afib & to Prevent Blood Clots  . Cholecalciferol 25 MCG (1000 UT) capsule Take 1,000 Units by mouth daily. Takes variable doses, 2-6 daily  . diazepam (VALIUM) 5 MG tablet Take 1/2 to 1 tablet 2 to 3 x /day for Dizziness /  Vertigo  . ferrous sulfate 325 (65 FE) MG EC tablet Take 325 mg by mouth daily.  Marland Kitchen ibuprofen (ADVIL) 200 MG tablet Take 200 mg by mouth every 6 (six) hours as needed. Take 2 tablets daily  . metoprolol tartrate (LOPRESSOR) 25 MG tablet TAKE 1 TABLET BY MOUTH DAILY  . Multiple Vitamins-Minerals (CENTRUM SILVER 50+WOMEN PO) Take 1 tablet by mouth daily.   . pantoprazole (PROTONIX) 40 MG tablet Take 1 tablet Daily for Indigestion & Bleeding Ulcers  . traMADol (ULTRAM) 50 MG tablet Take by mouth every 6 (six) hours as needed.  . Turmeric 500 MG TABS Take 1 tablet by mouth daily.  . Misc Natural Products (T-RELIEF CBD+13 SL) Place under the tongue. Takes CBD oil twice a day  . OVER THE COUNTER MEDICATION Takes OTC Aleve occasionally for pain.   No current facility-administered medications on file prior to visit.     ROS: Review of Systems  Constitutional: Negative for chills, diaphoresis, fever, malaise/fatigue and weight loss.  HENT: Negative for congestion, ear discharge, ear pain, hearing loss, nosebleeds, sinus pain, sore throat and tinnitus.   Eyes: Negative for blurred vision, double vision, photophobia, pain, discharge and redness.  Respiratory: Negative for cough, hemoptysis, sputum production, shortness of breath, wheezing and stridor.   Cardiovascular: Negative for chest pain, palpitations, orthopnea, claudication, leg swelling and PND.  Gastrointestinal: Negative for abdominal pain, blood in stool, constipation, diarrhea, heartburn, melena, nausea and vomiting.  Genitourinary: Positive for frequency. Negative for dysuria, flank pain, hematuria and urgency.  Musculoskeletal: Negative for back pain, falls, joint pain, myalgias and neck pain.  Skin: Negative for itching and rash.  Neurological: Negative for dizziness, tingling, tremors, sensory change, speech change, focal weakness, seizures, loss of consciousness, weakness and headaches.  Endo/Heme/Allergies: Negative for environmental  allergies and polydipsia. Does not bruise/bleed easily.  Psychiatric/Behavioral: Negative for depression, hallucinations, memory loss, substance abuse and suicidal ideas. The patient is not nervous/anxious and does not have insomnia.     Physical Exam:  BP 116/72   Pulse 88   Temp (!) 96.6 F (35.9 C)   Ht 5\' 4"  (1.626 m)   Wt 170 lb (77.1 kg)   SpO2 95%   BMI 29.18 kg/m   General Appearance: Well nourished, in no apparent distress. Eyes: PERRLA, EOMs, conjunctiva no swelling or erythema Sinuses: No Frontal/maxillary tenderness ENT/Mouth: Ext aud canals clear, TMs without erythema, bulging. No erythema, swelling, or exudate on post pharynx.  Tonsils not swollen or erythematous. Hearing normal.  Neck: Supple, thyroid normal.  Respiratory: Respiratory effort normal, BS equal bilaterally without rales, rhonchi, wheezing or stridor.  Cardio: RRR with no MRGs. Brisk peripheral pulses without edema.  Abdomen: Soft, + BS.  Non tender, no guarding, rebound, hernias, masses. Lymphatics: Non tender without lymphadenopathy.  Musculoskeletal: Full ROM, 5/5 strength, normal gait using rolling walker for stability.  DIP, PIP, MCP enlargement with some edema bilaterally Heber-den and Bouchards nodes.  Tender to all joints, no erythema or heat to joints, bilaterally. Tenderness to bilateral wrist.  Negative Phalens. Skin: Warm, dry without rashes, lesions, ecchymosis.  Neuro: Cranial nerves intact. Normal muscle tone, no cerebellar symptoms. Sensation intact.  Psych: Awake and oriented X 3, normal affect, Insight and Judgment appropriate.     Garnet Sierras, NP 4:35 PM [facility]

## 2018-12-27 ENCOUNTER — Other Ambulatory Visit: Payer: Self-pay

## 2018-12-27 ENCOUNTER — Ambulatory Visit (INDEPENDENT_AMBULATORY_CARE_PROVIDER_SITE_OTHER): Payer: PPO | Admitting: Adult Health Nurse Practitioner

## 2018-12-27 ENCOUNTER — Encounter: Payer: Self-pay | Admitting: Adult Health Nurse Practitioner

## 2018-12-27 VITALS — BP 116/72 | HR 88 | Temp 96.6°F | Ht 64.0 in | Wt 170.0 lb

## 2018-12-27 DIAGNOSIS — R350 Frequency of micturition: Secondary | ICD-10-CM

## 2018-12-27 DIAGNOSIS — M19041 Primary osteoarthritis, right hand: Secondary | ICD-10-CM

## 2018-12-27 DIAGNOSIS — I480 Paroxysmal atrial fibrillation: Secondary | ICD-10-CM | POA: Diagnosis not present

## 2018-12-27 DIAGNOSIS — M19042 Primary osteoarthritis, left hand: Secondary | ICD-10-CM | POA: Diagnosis not present

## 2018-12-27 MED ORDER — DICLOFENAC SODIUM 1 % TD GEL: 4.0000 g | Freq: Four times a day (QID) | TRANSDERMAL | 3 refills | Status: DC

## 2018-12-27 NOTE — Patient Instructions (Addendum)
Get  Voltaren gel over the counter to use for your hands.  I sent in a prescription but as pharmacists to help locate the over the counter version.  Increase Tylenol to 1,000mg  every 8 hours as needed for arthritis pain.    ELIQUIS IS COVERED FOR OVER 90% OF PATIENTS WITH COMMERCIAL & MEDICARE PART D PLANS As of March 18, 2018  See if you're covered by calling 1-855-ELIQUIS [phone]). Learn more about Prescription Coverage Assistance.   We checked a urine today for bacteria.  I will have the results in 3 days.  Continue to watch how much sodium, salt you are eating during the day.   2,000mg  a day, look at labels to ensure.  Too much salt in your diet can cause swelling then increased urination.  Use Warm Moist heal to your hands in the mornings to help get your hands moving.    Rheumatoid Arthritis Rheumatoid arthritis (RA) is a long-term (chronic) disease. RA causes inflammation in your joints. Your joints may feel painful, stiff, swollen, and warm. RA may start slowly. It most often affects the small joints of the hands and feet. It can also affect other parts of the body. Symptoms of RA often come and go. There is no cure for RA, but medicines can help your symptoms. What are the causes?  RA is an autoimmune disease. This means that your body's defense system (immune system) attacks healthy parts of your body by mistake. The exact cause of RA is not known. What increases the risk?  Being a woman.  Having a family history of RA or other diseases like RA.  Smoking.  Being overweight.  Being exposed to pollutants or chemicals. What are the signs or symptoms?  Morning stiffness that lasts longer than 30 minutes. This is often the first symptom.  Symptoms start slowly. They are often worse in the morning.  As RA gets worse, symptoms may include: ? Pain, stiffness, swelling, warmth, and tenderness in joints on both sides of your body. ? Loss of energy. ? Not feeling  hungry. ? Weight loss. ? A low fever. ? Dry eyes and a dry mouth. ? Firm lumps that grow under your skin. ? Changes in the way your joints look. ? Changes in the way your joints work.  Symptoms vary and they: ? Often come and go. ? Sometimes get worse for a period of time. These are called flares. How is this treated?   Treatment may include: ? Taking good care of yourself. Be sure to rest as needed, eat a healthy diet, and exercise. ? Medicines. These may include:  Pain relievers.  Medicines to help with inflammation.  Disease-modifying antirheumatic drugs (DMARDs).  Medicines called biologic response modifiers. ? Physical therapy and occupational therapy. ? Surgery, if joint damage is very bad. Your doctor will work with you to find the best treatments. Follow these instructions at home: Activity  Return to your normal activities as told by your doctor. Ask your doctor what activities are safe for you.  Rest when you have a flare.  Exercise as told by your doctor. General instructions  Take over-the-counter and prescription medicines only as told by your doctor.  Keep all follow-up visits as told by your doctor. This is important. Where to find more information  [employer] of Rheumatology: www.rheumatology.Home: www.arthritis.org Contact a doctor if:  You have a flare.  You have a fever.  You have problems because of your medicines. Get help right  away if:  You have chest pain.  You have trouble breathing.  You get a hot, painful joint all of a sudden, and it is worse than your normal joint aches. Summary  RA is a long-term disease.  Symptoms of RA start slowly. They are often worse in the morning.  RA causes inflammation in your joints. This information is not intended to replace advice given to you by your health care provider. Make sure you discuss any questions you have with your health care provider. Document  Released: 05/25/2011 Document Revised: 11/03/2017 Document Reviewed: 11/03/2017 Elsevier Patient Education  2020 [employer].

## 2018-12-28 NOTE — Addendum Note (Signed)
Addended byGarnet Sierras A on: 12/28/2018 05:10 PM   Modules accepted: Orders

## 2018-12-29 LAB — URINE CULTURE
MICRO NUMBER:: 986063
SPECIMEN QUALITY:: ADEQUATE

## 2019-01-02 ENCOUNTER — Telehealth: Payer: Self-pay | Admitting: *Deleted

## 2019-01-02 ENCOUNTER — Other Ambulatory Visit: Payer: Self-pay | Admitting: Internal Medicine

## 2019-01-02 DIAGNOSIS — M19042 Primary osteoarthritis, left hand: Secondary | ICD-10-CM

## 2019-01-02 DIAGNOSIS — M19041 Primary osteoarthritis, right hand: Secondary | ICD-10-CM

## 2019-01-02 MED ORDER — CELECOXIB 200 MG PO CAPS: ORAL_CAPSULE | ORAL | 0 refills | Status: DC

## 2019-01-02 NOTE — Telephone Encounter (Signed)
Patient called and reported she is having pain and swelling in both hands. She was advised to stop Ibuprofen by Garnet Sierras at her last OV. An RX for Voltaren gel or OTC sent to her pharmacy, but patient was not aware. Dr Melford Aase sent an RX for Celebrex 1 capsule daily and advised the patient to take Tylenol 1000 mg 3 to 4 times a day, with food, for pain also. He advised her to not take Ibuprofen, due to possible GI bleeding, since the patient also takes Eliquis.  Patient is aware of the information.

## 2019-01-12 DIAGNOSIS — H524 Presbyopia: Secondary | ICD-10-CM | POA: Diagnosis not present

## 2019-01-12 DIAGNOSIS — Z961 Presence of intraocular lens: Secondary | ICD-10-CM | POA: Diagnosis not present

## 2019-01-12 DIAGNOSIS — H52223 Regular astigmatism, bilateral: Secondary | ICD-10-CM | POA: Diagnosis not present

## 2019-01-19 ENCOUNTER — Telehealth: Payer: Self-pay | Admitting: *Deleted

## 2019-01-19 NOTE — Telephone Encounter (Signed)
Patient called and requested a referral to Dr Dossie Der or Dr Kathlene November for arthritis pain and swelling in hands. Petr Dr Melford Aase, the patient was notified the referral would be started.

## 2019-02-01 DIAGNOSIS — M4800 Spinal stenosis, site unspecified: Secondary | ICD-10-CM | POA: Diagnosis not present

## 2019-02-01 DIAGNOSIS — M25561 Pain in right knee: Secondary | ICD-10-CM | POA: Diagnosis not present

## 2019-02-01 DIAGNOSIS — M549 Dorsalgia, unspecified: Secondary | ICD-10-CM | POA: Diagnosis not present

## 2019-02-01 DIAGNOSIS — I4891 Unspecified atrial fibrillation: Secondary | ICD-10-CM | POA: Diagnosis not present

## 2019-02-01 DIAGNOSIS — E785 Hyperlipidemia, unspecified: Secondary | ICD-10-CM | POA: Diagnosis not present

## 2019-02-01 DIAGNOSIS — M79643 Pain in unspecified hand: Secondary | ICD-10-CM | POA: Diagnosis not present

## 2019-02-01 DIAGNOSIS — I10 Essential (primary) hypertension: Secondary | ICD-10-CM | POA: Diagnosis not present

## 2019-02-01 DIAGNOSIS — M79642 Pain in left hand: Secondary | ICD-10-CM | POA: Diagnosis not present

## 2019-02-01 DIAGNOSIS — M79641 Pain in right hand: Secondary | ICD-10-CM | POA: Diagnosis not present

## 2019-02-01 DIAGNOSIS — M25562 Pain in left knee: Secondary | ICD-10-CM | POA: Diagnosis not present

## 2019-02-01 DIAGNOSIS — M1990 Unspecified osteoarthritis, unspecified site: Secondary | ICD-10-CM | POA: Diagnosis not present

## 2019-02-01 DIAGNOSIS — M25569 Pain in unspecified knee: Secondary | ICD-10-CM | POA: Diagnosis not present

## 2019-02-08 ENCOUNTER — Other Ambulatory Visit: Payer: Self-pay | Admitting: Internal Medicine

## 2019-02-08 ENCOUNTER — Telehealth: Payer: Self-pay | Admitting: *Deleted

## 2019-02-08 MED ORDER — GABAPENTIN 400 MG PO CAPS: ORAL_CAPSULE | ORAL | 0 refills | Status: DC

## 2019-02-08 NOTE — Telephone Encounter (Signed)
Patient called and requested a pain medication for her arthritis.Dr Melford Aase sent an RX for Gabapentin 400 mg 1 capsule 3 times a day. Patient is aware.

## 2019-03-07 ENCOUNTER — Ambulatory Visit: Payer: PPO | Admitting: Adult Health Nurse Practitioner

## 2019-03-07 ENCOUNTER — Other Ambulatory Visit: Payer: Self-pay

## 2019-03-07 ENCOUNTER — Ambulatory Visit (INDEPENDENT_AMBULATORY_CARE_PROVIDER_SITE_OTHER): Payer: PPO | Admitting: Adult Health

## 2019-03-07 ENCOUNTER — Encounter: Payer: Self-pay | Admitting: Adult Health

## 2019-03-07 VITALS — BP 118/60 | HR 77 | Temp 97.5°F | Wt 174.0 lb

## 2019-03-07 DIAGNOSIS — I10 Essential (primary) hypertension: Secondary | ICD-10-CM | POA: Diagnosis not present

## 2019-03-07 DIAGNOSIS — Z79899 Other long term (current) drug therapy: Secondary | ICD-10-CM

## 2019-03-07 DIAGNOSIS — M19042 Primary osteoarthritis, left hand: Secondary | ICD-10-CM

## 2019-03-07 DIAGNOSIS — R7309 Other abnormal glucose: Secondary | ICD-10-CM

## 2019-03-07 DIAGNOSIS — J45909 Unspecified asthma, uncomplicated: Secondary | ICD-10-CM | POA: Diagnosis not present

## 2019-03-07 DIAGNOSIS — E782 Mixed hyperlipidemia: Secondary | ICD-10-CM

## 2019-03-07 DIAGNOSIS — I714 Abdominal aortic aneurysm, without rupture, unspecified: Secondary | ICD-10-CM

## 2019-03-07 DIAGNOSIS — D692 Other nonthrombocytopenic purpura: Secondary | ICD-10-CM | POA: Diagnosis not present

## 2019-03-07 DIAGNOSIS — E669 Obesity, unspecified: Secondary | ICD-10-CM

## 2019-03-07 DIAGNOSIS — E559 Vitamin D deficiency, unspecified: Secondary | ICD-10-CM

## 2019-03-07 DIAGNOSIS — M19041 Primary osteoarthritis, right hand: Secondary | ICD-10-CM | POA: Diagnosis not present

## 2019-03-07 DIAGNOSIS — S069XAA Unspecified intracranial injury with loss of consciousness status unknown, initial encounter: Secondary | ICD-10-CM

## 2019-03-07 DIAGNOSIS — R42 Dizziness and giddiness: Secondary | ICD-10-CM

## 2019-03-07 DIAGNOSIS — Z86711 Personal history of pulmonary embolism: Secondary | ICD-10-CM | POA: Diagnosis not present

## 2019-03-07 DIAGNOSIS — I480 Paroxysmal atrial fibrillation: Secondary | ICD-10-CM | POA: Diagnosis not present

## 2019-03-07 MED ORDER — CELECOXIB 200 MG PO CAPS: ORAL_CAPSULE | ORAL | 0 refills | Status: DC

## 2019-03-07 NOTE — Patient Instructions (Signed)
If your blood coutns today are normal - STOP iron - then in a few weeks do hemoccult card and send in     Can continue tylenol extra strength 1-2 tabs every 8 hours as needed  Try over the counter voltaren gel for hands and knees  Ice and rest for when knees or hands are swollen/"hot"   Can refer to ortho if you'd like at any point     Osteoarthritis  Osteoarthritis is a type of arthritis that affects tissue that covers the ends of bones in joints (cartilage). Cartilage acts as a cushion between the bones and helps them move smoothly. Osteoarthritis results when cartilage in the joints gets worn down. Osteoarthritis is sometimes called "wear and tear" arthritis. Osteoarthritis is the most common form of arthritis. It often occurs in older people. It is a condition that gets worse over time (a progressive condition). Joints that are most often affected by this condition are in:  Fingers.  Toes.  Hips.  Knees.  Spine, including neck and lower back. What are the causes? This condition is caused by age-related wearing down of cartilage that covers the ends of bones. What increases the risk? The following factors may make you more likely to develop this condition:  Older age.  Being overweight or obese.  Overuse of joints, such as in athletes.  Past injury of a joint.  Past surgery on a joint.  Family history of osteoarthritis. What are the signs or symptoms? The main symptoms of this condition are pain, swelling, and stiffness in the joint. The joint may lose its shape over time. Small pieces of bone or cartilage may break off and float inside of the joint, which may cause more pain and damage to the joint. Small deposits of bone (osteophytes) may grow on the edges of the joint. Other symptoms may include:  A grating or scraping feeling inside the joint when you move it.  Popping or creaking sounds when you move. Symptoms may affect one or more joints. Osteoarthritis  in a major joint, such as your knee or hip, can make it painful to walk or exercise. If you have osteoarthritis in your hands, you might not be able to grip items, twist your hand, or control small movements of your hands and fingers (fine motor skills). How is this diagnosed? This condition may be diagnosed based on:  Your medical history.  A physical exam.  Your symptoms.  X-rays of the affected joint(s).  Blood tests to rule out other types of arthritis. How is this treated? There is no cure for this condition, but treatment can help to control pain and improve joint function. Treatment plans may include:  A prescribed exercise program that allows for rest and joint relief. You may work with a physical therapist.  A weight control plan.  Pain relief techniques, such as: ? Applying heat and cold to the joint. ? Electric pulses delivered to nerve endings under the skin (transcutaneous electrical nerve stimulation, or TENS). ? Massage. ? Certain nutritional supplements.  NSAIDs or prescription medicines to help relieve pain.  Medicine to help relieve pain and inflammation (corticosteroids). This can be given by mouth (orally) or as an injection.  Assistive devices, such as a brace, wrap, splint, specialized glove, or cane.  Surgery, such as: ? An osteotomy. This is done to reposition the bones and relieve pain or to remove loose pieces of bone and cartilage. ? Joint replacement surgery. You may need this surgery if you have  very bad (advanced) osteoarthritis. Follow these instructions at home: Activity  Rest your affected joints as directed by your health care provider.  Do not drive or use heavy machinery while taking prescription pain medicine.  Exercise as directed. Your health care provider or physical therapist may recommend specific types of exercise, such as: ? Strengthening exercises. These are done to strengthen the muscles that support joints that are affected by  arthritis. They can be performed with weights or with exercise bands to add resistance. ? Aerobic activities. These are exercises, such as brisk walking or water aerobics, that get your heart pumping. ? Range-of-motion activities. These keep your joints easy to move. ? Balance and agility exercises. Managing pain, stiffness, and swelling      If directed, apply heat to the affected area as often as told by your health care provider. Use the heat source that your health care provider recommends, such as a moist heat pack or a heating pad. ? If you have a removable assistive device, remove it as told by your health care provider. ? Place a towel between your skin and the heat source. If your health care provider tells you to keep the assistive device on while you apply heat, place a towel between the assistive device and the heat source. ? Leave the heat on for 20-30 minutes. ? Remove the heat if your skin turns bright red. This is especially important if you are unable to feel pain, heat, or cold. You may have a greater risk of getting burned.  If directed, put ice on the affected joint: ? If you have a removable assistive device, remove it as told by your health care provider. ? Put ice in a plastic bag. ? Place a towel between your skin and the bag. If your health care provider tells you to keep the assistive device on during icing, place a towel between the assistive device and the bag. ? Leave the ice on for 20 minutes, 2-3 times a day. General instructions  Take over-the-counter and prescription medicines only as told by your health care provider.  Maintain a healthy weight. Follow instructions from your health care provider for weight control. These may include dietary restrictions.  Do not use any products that contain nicotine or tobacco, such as cigarettes and e-cigarettes. These can delay bone healing. If you need help quitting, ask your health care provider.  Use assistive  devices as directed by your health care provider.  Keep all follow-up visits as told by your health care provider. This is important. Where to find more information  [employer] of Arthritis and Musculoskeletal and Skin Diseases: www.niams.SouthExposed.es  [employer] on Aging: http://kim-miller.com/  American College of Rheumatology: www.rheumatology.org Contact a health care provider if:  Your skin turns red.  You develop a rash.  You have pain that gets worse.  You have a fever along with joint or muscle aches. Get help right away if:  You lose a lot of weight.  You suddenly lose your appetite.  You have night sweats. Summary  Osteoarthritis is a type of arthritis that affects tissue covering the ends of bones in joints (cartilage).  This condition is caused by age-related wearing down of cartilage that covers the ends of bones.  The main symptom of this condition is pain, swelling, and stiffness in the joint.  There is no cure for this condition, but treatment can help to control pain and improve joint function. This information is not intended to replace advice  given to you by your health care provider. Make sure you discuss any questions you have with your health care provider. Document Released: 03/02/2005 Document Revised: 02/12/2017 Document Reviewed: 11/04/2015 Elsevier Patient Education  2020 [employer].

## 2019-03-07 NOTE — Progress Notes (Signed)
3 MONTH FOLLOW UP  Assessment:    Essential hypertension - continue medications, DASH diet, exercise and monitor at home. Call if greater than 130/80.  -     CBC with Differential/Platelet -     COMPLETE METABOLIC PANEL WITH GFR -     TSH  Chronic atrial fibrillation ([name]) rate controlled, on NOAC and metoprolol   History of PE On elequis; continue due to a. 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   Medication management  Symptomatic anemia CBC/iron resolved to baseline - stop supplement Continue to monitor CBC  GERD with esophagitis Continue PPI daily indefinitely per GI; monitor for iron def anemia closely, anticipate chronic GI bleed due to cameron lesions Discussed diet, avoiding triggers and other lifestyle changes  Abnormal glucose Recent A1Cs at goal Discussed diet/exercise, weight management  Defer A1C; check CMP  Vitamin D deficiency Continue supplement   Overweight  - long discussion about weight loss, diet, and exercise -recommended diet heavy in fruits and veggies and low in animal meats, cheeses, and dairy products  Senile purpura ([name]) R/t NOAC and age Reassured, protect skin, sunscreen  Osteoarthritis of bilateral wrists/hands Negative autoimmune workup by Dr. Dossie Der Recommended ortho referral; patient declines at this time due to financial constraints Reviewed pain medication options; encouraged voltaren gel which she has not been utilizing  Patient declines most exam/vaccines- states want to enjoy what times he has and would declines any treatments.   Over 40 minutes of exam, counseling, chart review and critical decision making was performed Future Appointments  Date Time Provider [place]  06/28/2019 10:00 AM Unk Pinto, MD [facility] None  10/02/2019  11:15 AM [name], NP [facility] None     Subjective:  Lori [name] is a 78 y.o. female who presents for 3 month follow up.   Patient was hospitalized in Nov 2019  with a severe GI bleed (hgb 5 gm%) requiring  pRBC transfusions and found to have esophagitis, erosive gastropathy and a small colon polyp & was initiated on PPI. During hospitalization, she had pAfib w/RVR and was started on Eliquis & metoprolol. She was also found to have PE/pericadial effusion. She had prior hx/o pAfib in Jan 2019 and admantly refused Coumadin/NOAC prior to this episode opting to take LD bASA. (CHADsVASc=3). CBCs and iron levels have since resolved to normal range.   She was recently referred to Dr. Dossie Der due to widespread arthritis, stiffness and pain worst in bil hands and wrists, also bil knee pain, unremarkable autoimmune lab workup and was recommend follow up with ortho. She declines this today due to specialist copay cost. Wrist pain aggravated by walker use. She was prescribed celebrex but never started, some concern with concurrent elequis and recent hx of GI bleed. She is prescribed voltaren gel but hasn't been using, taking tumeric 500 mg tabs, gabapentin 400 mg TID PRN, also prescribe tramadol 50 mg via Dr. Ellene Route though hasn't refilled since 12/23/2018.  She has spinal stenosis - follows with Dr. Ellene Route, getting spine injections, uses walker. Denies any recent falls. Did PT but not very helpful.   Lab Results  Component Value Date   WBC 8.2 12/05/2018   HGB 13.4 12/05/2018   HCT 40.4 12/05/2018   MCV 93.1 12/05/2018   PLT 354 12/05/2018   Lab Results  Component Value Date   IRON 115  05/26/2018   TIBC 270 05/26/2018   FERRITIN 81 05/26/2018   She takes CBD oil PRN anxiety and LE pain, uses valium rarely and reports she is managing well for vertigo and anxiety.   BMI is Body mass index is 29.87 kg/m., she has not been working on diet. She isn't watching her diet much. Eats whatever she  wants.  Wt Readings from Last 3 Encounters:  03/07/19 174 lb (78.9 kg)  12/27/18 170 lb (77.1 kg)  12/05/18 170 lb (77.1 kg)   In 2009, she had a false (+) Myoview confirmed by a Negative Heart Cath. She has history of Afib, CHA2DS2VASC 3, declined anticoag/NOAC for many years until recent PE and doing well on elequis.  Her blood pressure has been controlled at home, today their BP is BP: 118/60  She does not workout but is very active with 8 dogs and goats. She denies chest pain, shortness of breath, dizziness.   She is not on cholesterol medication and denies myalgias. Her cholesterol is at goal. The cholesterol last visit was:   Lab Results  Component Value Date   CHOL 156 12/05/2018   HDL 45 (L) 12/05/2018   LDLCALC 90 12/05/2018   TRIG 112 12/05/2018   CHOLHDL 3.5 12/05/2018    She has been working on diet and exercise for glucose management, and denies polydipsia, polyuria and visual disturbances. Last A1C in the office was:  Lab Results  Component Value Date   HGBA1C 5.3 12/05/2018   Lab Results  Component Value Date   GFRNONAA 68 12/05/2018   Patient is on Vitamin D supplement.   Lab Results  Component Value Date   VD25OH 68 12/05/2018      Medication Review: Current Outpatient Medications on File Prior to Visit  Medication Sig Dispense Refill  . acetaminophen (TYLENOL) 325 MG tablet Take 2 tablets (650 mg total) by mouth every 6 (six) hours as needed for mild pain (or Fever >/= 101). 30 tablet 0  . apixaban (ELIQUIS) 5 MG TABS tablet Take 1 tablet 2 x /day for Afib & to Prevent Blood Clots 180 tablet 3  . Cholecalciferol 25 MCG (1000 UT) capsule Take 1,000 Units by mouth daily. Takes variable doses, 2-6 daily    . diazepam (VALIUM) 5 MG tablet Take 1/2 to 1 tablet 2 to 3 x /day for Dizziness / Vertigo 90 tablet 0  . ferrous sulfate 325 (65 FE) MG EC tablet Take 325 mg by mouth daily.    Marland Kitchen gabapentin (NEURONTIN) 400 MG capsule Take 1 capsule 3 x /day  as needed for  Pain 90 capsule 0  . metoprolol tartrate (LOPRESSOR) 25 MG tablet TAKE 1 TABLET BY MOUTH DAILY 90 tablet 1  . pantoprazole (PROTONIX) 40 MG tablet Take 1 tablet Daily for Indigestion & Bleeding Ulcers 90 tablet 3  . Turmeric 500 MG TABS Take 1 tablet by mouth daily.    . diclofenac sodium (VOLTAREN) 1 % GEL Apply 4 g topically 4 (four) times daily. (Patient not taking: Reported on 03/07/2019) 100 g 3  . OVER THE COUNTER MEDICATION Takes OTC Aleve occasionally for pain.    . traMADol (ULTRAM) 50 MG tablet Take by mouth every 6 (six) hours as needed.     No current facility-administered medications on file prior to visit.    No Known Allergies  Current Problems (verified) Patient Active Problem List   Diagnosis Date Noted  . Varicose vein of leg 09/01/2018  . Gastroesophageal reflux disease with esophagitis   .  History of pulmonary embolism (01/2018) 02/04/2018  . Senile purpura ([place]) 07/20/2017  . Aortic aneurysm ([place]) 06/30/2017  . Atrial fibrillation ([place]) 06/16/2017  . 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 Obesity (BMI 30.0-34.9) 02/06/2015  . Medication management 11/18/2013  . Hyperlipidemia, mixed   . Abnormal glucose   . Vitamin D deficiency   . Asthma   . Essential hypertension   . Migraines     Patient Care Team: Unk Pinto, MD as PCP - General (Internal Medicine) Unk Pinto, MD as PCP - Internal Medicine (Internal Medicine) Clent Jacks, MD as Consulting Physician (Ophthalmology) Ladene Artist, MD as Consulting Physician (Gastroenterology)  SURGICAL HISTORY She  has a past surgical history that includes Tubal ligation; Cerebral aneurysm repair; Cataract extraction w/ intraocular lens  implant, bilateral (2009); Foot surgery (1993); Colonoscopy (N/A, 02/05/2018); Esophagogastroduodenoscopy (N/A, 02/05/2018); biopsy (02/05/2018); and polypectomy (02/05/2018). FAMILY HISTORY Her family history includes Cancer in her father and mother; Diabetes  in her mother; Hypertension in her mother. SOCIAL HISTORY She  reports that she quit smoking about 28 years ago. She has never used smokeless tobacco. She reports that she does not drink alcohol or use drugs.   Review of Systems  Constitutional: Negative for malaise/fatigue and weight loss.  HENT: Negative for hearing loss and tinnitus.   Eyes: Negative for blurred vision and double vision.  Respiratory: Negative for cough, shortness of breath and wheezing.   Cardiovascular: Negative for chest pain, palpitations, orthopnea, claudication and leg swelling.  Gastrointestinal: Negative for abdominal pain, blood in stool, constipation, diarrhea, heartburn, melena, nausea and vomiting.  Genitourinary: Negative.   Musculoskeletal: Positive for back pain, joint pain and myalgias. Negative for falls.  Skin: Negative for rash.  Neurological: Positive for dizziness (takes valium PRN ). Negative for tingling, sensory change, weakness and headaches.  Endo/Heme/Allergies: Negative for polydipsia.  Psychiatric/Behavioral: Negative.   All other systems reviewed and are negative.    Objective:     Today's Vitals   03/07/19 1535  BP: 118/60  Pulse: 77  Temp: (!) 97.5 F (36.4 C)  SpO2: 98%  Weight: 174 lb (78.9 kg)   Body mass index is 29.87 kg/m.  General appearance: alert, no distress, WD/WN, female HEENT: normocephalic, sclerae anicteric, TMs pearly, nares patent, no discharge or erythema, mask in place, oral exam deferred Neck: supple, no lymphadenopathy, no thyromegaly, no masses Heart: Irregularly irregular, normal S1, S2, no murmurs Lungs: CTA bilaterally, no wheezes, rhonchi, or rales Abdomen: +bs, soft, non tender, non distended, no masses, no hepatomegaly, no splenomegaly Musculoskeletal: She has mild swelling in bil wrists, through PIP joints bil hands, left knee Extremities: no edema, no cyanosis, no clubbing, numerous superficial varicose veins of bilateral lower legs Pulses:  2+ symmetric, upper and lower extremities, normal cap refill Skin: warm/dry, intact; scattered small ecchymoses to bil upper extremities; no rash or lesions Neurological: alert, oriented x 3,  strength normal upper extremities and lower extremities, sensation normal throughout,  no cerebellar signs, gait slow with walker Psychiatric: normal affect, behavior normal, pleasant     Izora Ribas, NP   03/07/2019

## 2019-03-08 ENCOUNTER — Other Ambulatory Visit: Payer: Self-pay | Admitting: Adult Health

## 2019-03-08 DIAGNOSIS — D500 Iron deficiency anemia secondary to blood loss (chronic): Secondary | ICD-10-CM | POA: Insufficient documentation

## 2019-03-08 DIAGNOSIS — Z862 Personal history of diseases of the blood and blood-forming organs and certain disorders involving the immune mechanism: Secondary | ICD-10-CM

## 2019-03-08 DIAGNOSIS — K2101 Gastro-esophageal reflux disease with esophagitis, with bleeding: Secondary | ICD-10-CM

## 2019-03-08 HISTORY — DX: Iron deficiency anemia secondary to blood loss (chronic): D50.0

## 2019-03-08 LAB — LIPID PANEL
Cholesterol: 153 mg/dL (ref <200)
HDL: 37 mg/dL — ABNORMAL LOW (ref >=50)
LDL Cholesterol (Calc): 88 mg/dL (calc)
Non-HDL Cholesterol (Calc): 116 mg/dL (calc) (ref <130)
Total CHOL/HDL Ratio: 4.1 (calc) (ref <5.0)
Triglycerides: 179 mg/dL — ABNORMAL HIGH (ref <150)

## 2019-03-08 LAB — CBC WITH DIFFERENTIAL/PLATELET
Absolute Monocytes: 706 cells/uL (ref 200–950)
Basophils Absolute: 59 cells/uL (ref 0–200)
Basophils Relative: 0.9 %
Eosinophils Absolute: 33 cells/uL (ref 15–500)
Eosinophils Relative: 0.5 %
HCT: 38.6 % (ref 35.0–45.0)
Hemoglobin: 12.7 g/dL (ref 11.7–15.5)
Lymphs Abs: 1228 cells/uL (ref 850–3900)
MCH: 29.1 pg (ref 27.0–33.0)
MCHC: 32.9 g/dL (ref 32.0–36.0)
MCV: 88.3 fL (ref 80.0–100.0)
MPV: 12.3 fL (ref 7.5–12.5)
Monocytes Relative: 10.7 %
Neutro Abs: 4574 cells/uL (ref 1500–7800)
Neutrophils Relative %: 69.3 %
Platelets: 258 10*3/uL (ref 140–400)
RBC: 4.37 10*6/uL (ref 3.80–5.10)
RDW: 13.5 % (ref 11.0–15.0)
Total Lymphocyte: 18.6 %
WBC: 6.6 10*3/uL (ref 3.8–10.8)

## 2019-03-08 LAB — COMPLETE METABOLIC PANEL WITH GFR
AG Ratio: 1.9 (calc) (ref 1.0–2.5)
ALT: 6 U/L (ref 6–29)
AST: 9 U/L — ABNORMAL LOW (ref 10–35)
Albumin: 3.8 g/dL (ref 3.6–5.1)
Alkaline phosphatase (APISO): 104 U/L (ref 37–153)
BUN: 18 mg/dL (ref 7–25)
CO2: 27 mmol/L (ref 20–32)
Calcium: 9.4 mg/dL (ref 8.6–10.4)
Chloride: 109 mmol/L (ref 98–110)
Creat: 0.81 mg/dL (ref 0.60–0.93)
GFR, Est African American: 82 mL/min/{1.73_m2} (ref >=60)
GFR, Est Non African American: 71 mL/min/{1.73_m2} (ref >=60)
Globulin: 2 g/dL (calc) (ref 1.9–3.7)
Glucose, Bld: 92 mg/dL (ref 65–99)
Potassium: 4.4 mmol/L (ref 3.5–5.3)
Sodium: 144 mmol/L (ref 135–146)
Total Bilirubin: 0.3 mg/dL (ref 0.2–1.2)
Total Protein: 5.8 g/dL — ABNORMAL LOW (ref 6.1–8.1)

## 2019-03-08 LAB — MAGNESIUM: Magnesium: 2.2 mg/dL (ref 1.5–2.5)

## 2019-03-08 LAB — TSH: TSH: 2.54 mIU/L (ref 0.40–4.50)

## 2019-03-18 ENCOUNTER — Other Ambulatory Visit: Payer: Self-pay | Admitting: Internal Medicine

## 2019-03-28 DIAGNOSIS — M5136 Other intervertebral disc degeneration, lumbar region: Secondary | ICD-10-CM | POA: Diagnosis not present

## 2019-04-03 ENCOUNTER — Other Ambulatory Visit: Payer: Self-pay | Admitting: Internal Medicine

## 2019-04-20 ENCOUNTER — Encounter: Payer: Self-pay | Admitting: Physician Assistant

## 2019-04-20 ENCOUNTER — Ambulatory Visit (INDEPENDENT_AMBULATORY_CARE_PROVIDER_SITE_OTHER): Payer: PPO | Admitting: Physician Assistant

## 2019-04-20 ENCOUNTER — Other Ambulatory Visit: Payer: Self-pay

## 2019-04-20 VITALS — BP 124/72 | HR 70 | Temp 97.3°F | Ht 64.0 in | Wt 172.0 lb

## 2019-04-20 DIAGNOSIS — D6859 Other primary thrombophilia: Secondary | ICD-10-CM

## 2019-04-20 DIAGNOSIS — M19042 Primary osteoarthritis, left hand: Secondary | ICD-10-CM | POA: Diagnosis not present

## 2019-04-20 DIAGNOSIS — M48061 Spinal stenosis, lumbar region without neurogenic claudication: Secondary | ICD-10-CM

## 2019-04-20 DIAGNOSIS — M19041 Primary osteoarthritis, right hand: Secondary | ICD-10-CM | POA: Diagnosis not present

## 2019-04-20 DIAGNOSIS — G8929 Other chronic pain: Secondary | ICD-10-CM | POA: Diagnosis not present

## 2019-04-20 HISTORY — DX: Other primary thrombophilia: D68.59

## 2019-04-20 HISTORY — DX: Spinal stenosis, lumbar region without neurogenic claudication: M48.061

## 2019-04-20 NOTE — Patient Instructions (Signed)
Dr. Nicholaus Bloom Dr. Normajean [place]  Pain management doctors that I would like to refer you to  [facility] for you to take the voltern gel- up to 4 x a day  If this does not help we can do the tramadol as needed for up to 5 days.    Can call your insurance to see if they cover accupunture Here are two places in [place] that are good [facility]  [address]   [phone]  [place]   Acupuncture clinic  [place]   [phone]

## 2019-04-20 NOTE — Progress Notes (Signed)
Assessment and Plan:  Arthritis of both hands Spinal stenosis of lumbar region, unspecified whether neurogenic claudication present Chronic pain - try voltern gel, offered to refill 5 day course of tramadol but explained that we are unable to refill continously here. Offered referral to pain management for procedures and medications management since the patient has severe pain that affects ADL's and quality of life, she declines at this time but will consider it.   Thrombophilia ([place]) On eliquis- avoid NSAIDS  Future Appointments  Date Time Provider [place]  06/28/2019 10:00 AM Unk Pinto, MD [facility] None  10/02/2019 11:15 AM [name], NP [facility] None     HPI 79 [name] presents for follow up joint pain.  She had a follow up with Dr. Dossie Der due to bilateral joint pain/wide spread OA, had a negative autoimmune work up.   She is on eliquis, hx of GI bleed, trying to avoid oral NSAIDS.  She is prescribed voltaren gel but hasn't been using, states the pharmacist said it can mess with her eliquis so she did not take it , taking tumeric 500 mg tabs, gabapentin 400 mg TID, also prescribe tramadol 50 mg via Dr. Ellene Route though hasn't refilled since 12/23/2018. States it does help the pain when she has it.   She has spinal stenosis - follows with Dr. Ellene Route, getting spine injections, uses walker. Denies any recent falls. Did PT but not very helpful.     Lab Results  Component Value Date   GFRNONAA 71 03/07/2019    Patient Active Problem List   Diagnosis Date Noted  . History of iron deficiency anemia 03/08/2019  . Varicose vein of leg 09/01/2018  . Gastroesophageal reflux disease with esophagitis   . History of pulmonary embolism (01/2018) 02/04/2018  . Senile purpura ([place]) 07/20/2017  . Atrial fibrillation ([place]) 06/16/2017  . 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 Obesity (BMI 30.0-34.9) 02/06/2015  . Medication management 11/18/2013  .  Hyperlipidemia, mixed   . Abnormal glucose   . Vitamin D deficiency   . Asthma   . Essential hypertension   . Migraines       Current Outpatient Medications (Cardiovascular):  .  metoprolol tartrate (LOPRESSOR) 25 MG tablet, TAKE 1 TABLET BY MOUTH ONCE DAILY   Current Outpatient Medications (Analgesics):  .  acetaminophen (TYLENOL) 325 MG tablet, Take 2 tablets (650 mg total) by mouth every 6 (six) hours as needed for mild pain (or Fever >/= 101). .  traMADol (ULTRAM) 50 MG tablet, Take by mouth every 6 (six) hours as needed.  Current Outpatient Medications (Hematological):  .  apixaban (ELIQUIS) 5 MG TABS tablet, Take 1 tablet 2 x /day for Afib & to Prevent Blood Clots .  ferrous sulfate 325 (65 FE) MG EC tablet, Take 325 mg by mouth daily.  Current Outpatient Medications (Other):  Marland Kitchen  Cholecalciferol 25 MCG (1000 UT) capsule, Take 1,000 Units by mouth daily. Takes variable doses, 2-6 daily .  diazepam (VALIUM) 5 MG tablet, Take 1/2 to 1 tablet 2 to 3 x /day for Dizziness / Vertigo .  gabapentin (NEURONTIN) 400 MG capsule, Take 1 capsule 3 x /day for Pain .  pantoprazole (PROTONIX) 40 MG tablet, Take 1 tablet Daily for Indigestion & Bleeding Ulcers .  Turmeric 500 MG TABS, Take 1 tablet by mouth daily. .  diclofenac sodium (VOLTAREN) 1 % GEL, Apply 4 g topically 4 (four) times daily. (Patient not taking: Reported on 03/07/2019)  No Known Allergies  ROS:  all negative except above.   Physical Exam: Filed Weights   04/20/19 1441  Weight: 172 lb (78 kg)   BP 124/72   Pulse 70   Temp (!) 97.3 F (36.3 C)   Ht 5\' 4"  (1.626 m)   Wt 172 lb (78 kg)   SpO2 95%   BMI 29.52 kg/m   General appearance: alert, no distress, WD/WN, female HEENT: normocephalic, sclerae anicteric, TMs pearly, nares patent, no discharge or erythema, mask in place, oral exam deferred Neck: supple, no lymphadenopathy, no thyromegaly, no masses Heart: Irregularly irregular, normal S1, S2, no  murmurs Lungs: CTA bilaterally, no wheezes, rhonchi, or rales Abdomen: +bs, soft, non tender, non distended, no masses, no hepatomegaly, no splenomegaly Musculoskeletal: She has mild swelling in bil wrists, through PIP joints bil hands, left knee, bilateral feet some swelling into the ankles.  Extremities: no edema, no cyanosis, no clubbing, numerous superficial varicose veins of bilateral lower legs Pulses: 2+ symmetric, upper and lower extremities, normal cap refill Skin: warm/dry, intact; scattered small ecchymoses to bil upper extremities; no rash or lesions Neurological: alert, oriented x 3, strength normal upper extremities and decreased slightly in bilateral lower extremities,  no cerebellar signs, gait slow with two canes Psychiatric: normal affect, behavior normal, pleasant   Vicie Mutters, PA-C 3:00 PM [facility]

## 2019-05-10 ENCOUNTER — Ambulatory Visit (INDEPENDENT_AMBULATORY_CARE_PROVIDER_SITE_OTHER): Payer: PPO | Admitting: Internal Medicine

## 2019-05-10 ENCOUNTER — Encounter: Payer: Self-pay | Admitting: Internal Medicine

## 2019-05-10 ENCOUNTER — Other Ambulatory Visit: Payer: Self-pay

## 2019-05-10 VITALS — BP 128/68 | HR 76 | Temp 97.2°F | Resp 16 | Ht 64.0 in | Wt 171.2 lb

## 2019-05-10 DIAGNOSIS — R609 Edema, unspecified: Secondary | ICD-10-CM

## 2019-05-10 DIAGNOSIS — I10 Essential (primary) hypertension: Secondary | ICD-10-CM

## 2019-05-10 MED ORDER — TRIAMTERENE-HCTZ 37.5-25 MG PO CAPS: ORAL_CAPSULE | ORAL | 3 refills | Status: DC

## 2019-05-10 NOTE — Progress Notes (Addendum)
   History of Present Illness:      This very nice 79 y.o.  DWF presents for  follow up with HTN, cAfib, HLD, Pre-Diabetes and Vitamin D Deficiency. Today, she relates concerns of dependent type edema that worsens throughout the day especially after prolonged standing. She denies sx's of CP, palpitations dyspnea, orthopnea, orthopnea , cough or chest congestion.   Patient was hospitalized in Nov 2019 with Afib, PE & severe Anemia requiring 4 u pRBC Tx. EGD found severe esophagitis/gastritis & Colonoscopy found a small 6 mm polyp.   Wt Readings from Last 3 Encounters:  05/10/19 171 lb 3.2 oz (77.7 kg)  04/20/19 172 lb (78 kg)  03/07/19 174 lb (78.9 kg)   Medications  .  metoprolol tartrate (LOPRESSOR) 25 MG tablet, TAKE 1 TABLET DAILY .  aceaminophen (TYLENOL) 325 MG tablet, Take 2 tablets (650 mg total) by mouth every 6 (six) hours as needed for mild pain (or Fever >/= 101). Marland Kitchen  apixaban (ELIQUIS) 5 MG TABS tablet, Take 1 tablet 2 x /day for Afib & to Prevent Blood Clots .  ferrous sulfate 325 (65 FE) MG EC tablet, Take 325 mg by mouth daily.  .  Cholecalciferol 25 MCG (1000 UT) caps, Takes variable doses, 2-6 daily .  diazepam (VALIUM) 5 MG tablet, Take 1/2 to 1 tablet 2 to 3 x /day for Dizziness / Vertigo .  diclofenac sodium (VOLTAREN) 1 % GEL, Apply 4 g topically 4 (four) times daily. Marland Kitchen  gabapentin (NEURONTIN) 400 MG capsule, Take 1 capsule 3 x /day for Pain .  pantoprazole (PROTONIX) 40 MG tablet, Take 1 tablet Daily for Indigestion & Bleeding Ulcers .  Turmeric 500 MG TABS, Take 1 tablet by mouth daily.  Problem list She has Abnormal glucose; Vitamin D deficiency; Asthma; Essential hypertension; Migraines; Hyperlipidemia, mixed; Medication management; Vertigo due to brain injury [facility]); Obesity (BMI 30.0-34.9); Atrial fibrillation ([place]); Senile purpura ([place]); History of pulmonary embolism (01/2018); Gastroesophageal reflux disease with esophagitis; Varicose vein of leg; History of iron  deficiency anemia; Thrombophilia ([place]); Spinal stenosis of lumbar region; and Arthritis of both hands on their problem list.   Observations/Objective:  BP 128/68   Pulse 76   Temp (!) 97.2 F (36.2 C)   Resp 16   Ht 5\' 4"  (1.626 m)   Wt 171 lb 3.2 oz (77.7 kg)   BMI 29.39 kg/m   General : Well sounding patient in no apparent distress HEENT: no hoarseness, no cough for duration of visit Lungs: speaks in complete sentences, no audible wheezing, no apparent distress Neurological: alert, oriented x 3 Psychiatric: pleasant, judgement appropriate   Assessment and Plan:  1. Essential hypertension  - triamterene-hydrochlorothiazide (DYAZIDE) 37.5-25 MG capsule; Take 1 capsule Daily for Fluid Retention & Ankle Swelling  Dispense: 90 capsule; Refill: 3  2. Dependent edema  - triamterene-hydrochlorothiazide (DYAZIDE) 37.5-25 MG capsule; Take 1 capsule Daily for Fluid Retention & Ankle Swelling  Dispense: 90 capsule; Refill: 3       I discussed the assessment and treatment plan with the patient. The patient was provided an opportunity to ask questions and all were answered. The patient agreed with the plan and demonstrated an understanding of the instructions. The patient was advised to call back or seek an in-person evaluation if the symptoms worsen or if the condition fails to improve as anticipated.   Kirtland Bouchard, MD

## 2019-06-02 ENCOUNTER — Encounter: Payer: Self-pay | Admitting: Internal Medicine

## 2019-06-28 ENCOUNTER — Other Ambulatory Visit: Payer: Self-pay

## 2019-06-28 ENCOUNTER — Encounter: Payer: Self-pay | Admitting: Internal Medicine

## 2019-06-28 ENCOUNTER — Ambulatory Visit (INDEPENDENT_AMBULATORY_CARE_PROVIDER_SITE_OTHER): Payer: PPO | Admitting: Internal Medicine

## 2019-06-28 VITALS — BP 102/80 | HR 72 | Temp 97.8°F | Resp 16 | Ht 64.0 in | Wt 155.4 lb

## 2019-06-28 DIAGNOSIS — E782 Mixed hyperlipidemia: Secondary | ICD-10-CM

## 2019-06-28 DIAGNOSIS — Z136 Encounter for screening for cardiovascular disorders: Secondary | ICD-10-CM | POA: Diagnosis not present

## 2019-06-28 DIAGNOSIS — Z79899 Other long term (current) drug therapy: Secondary | ICD-10-CM | POA: Diagnosis not present

## 2019-06-28 DIAGNOSIS — E559 Vitamin D deficiency, unspecified: Secondary | ICD-10-CM

## 2019-06-28 DIAGNOSIS — Z8249 Family history of ischemic heart disease and other diseases of the circulatory system: Secondary | ICD-10-CM | POA: Diagnosis not present

## 2019-06-28 DIAGNOSIS — S069XAA Unspecified intracranial injury with loss of consciousness status unknown, initial encounter: Secondary | ICD-10-CM

## 2019-06-28 DIAGNOSIS — I10 Essential (primary) hypertension: Secondary | ICD-10-CM

## 2019-06-28 DIAGNOSIS — R7309 Other abnormal glucose: Secondary | ICD-10-CM

## 2019-06-28 DIAGNOSIS — R42 Dizziness and giddiness: Secondary | ICD-10-CM

## 2019-06-28 DIAGNOSIS — Z87891 Personal history of nicotine dependence: Secondary | ICD-10-CM

## 2019-06-28 DIAGNOSIS — Z0000 Encounter for general adult medical examination without abnormal findings: Secondary | ICD-10-CM

## 2019-06-28 DIAGNOSIS — I480 Paroxysmal atrial fibrillation: Secondary | ICD-10-CM | POA: Diagnosis not present

## 2019-06-28 DIAGNOSIS — Z0001 Encounter for general adult medical examination with abnormal findings: Secondary | ICD-10-CM

## 2019-06-28 DIAGNOSIS — K2101 Gastro-esophageal reflux disease with esophagitis, with bleeding: Secondary | ICD-10-CM

## 2019-06-28 NOTE — Patient Instructions (Addendum)

## 2019-06-28 NOTE — Progress Notes (Signed)
Annual Screening/Preventative Visit & Comprehensive Evaluation &  Examination     This very nice 79 y.o. DWF presents for a Screening /Preventative Visit & comprehensive evaluation and management of multiple medical co-morbidities.  Patient has been followed for HTN, HLD, Prediabetes  and Vitamin D Deficiency.  In 2001, she had surgery for a Brain Aneurysm and has had  intermittant vertigo since then.    Patient was hospitalized in Nov 2019 with Afib, PE & severe Anemia requiring 4 u pRBC Tx. EGD found severe esophagitis/gastritis & Colonoscopy found a small 6 mm polyp. She was discharged on Protonix, oral iron & Eliquis. She had prior hx/o pAfib in Jan 2019 and admantly refused Coumadin/NOAC opting to take LD bASA. (CHADsVASc=3).           In August 2020, Lumbar MRI showed severe spinal stenosis & she was referred to Neurosurgery - Dr Ellene Route, who recommended initial conservative approach with EDSI's.       HTN predates since 2009.  In 2009 she had a False (+) Myoview and a Negative Heart Cath. Patient's BP has been controlled at home and patient denies any cardiac symptoms as chest pain, palpitations, shortness of breath, dizziness or ankle swelling. Today's BP is at goal - 102/80.      Patient's hyperlipidemia is controlled with diet and medications. Patient denies myalgias or other medication SE's. Last lipids were at goal except elevated Trig's:  Lab Results  Component Value Date   CHOL 153 03/07/2019   HDL 37 (L) 03/07/2019   LDLCALC 88 03/07/2019   TRIG 179 (H) 03/07/2019   CHOLHDL 4.1 03/07/2019       Patient has hx/o prediabetes(A1c 5.9% / 2013) and patient denies reactive hypoglycemic symptoms, visual blurring, diabetic polys or paresthesias. Last A1c was Normal & at goal:  Lab Results  Component Value Date   HGBA1C 5.3 12/05/2018       Finally, patient has history of Vitamin D Deficiency ("11" / 2008) and last Vitamin D was at goal:  Lab Results  Component Value Date    VD25OH 68 12/05/2018    Current Outpatient Medications on File Prior to Visit  Medication Sig  . acetaminophen (TYLENOL) 325 MG tablet Take 2 tablets (650 mg total) by mouth every 6 (six) hours as needed for mild pain (or Fever >/= 101).  Marland Kitchen apixaban (ELIQUIS) 5 MG TABS tablet Take 1 tablet 2 x /day for Afib & to Prevent Blood Clots  . Cholecalciferol 25 MCG (1000 UT) capsule Take 1,000 Units by mouth daily. Takes variable doses, 2-6 daily  . diazepam (VALIUM) 5 MG tablet Take 1/2 to 1 tablet 2 to 3 x /day for Dizziness / Vertigo  . diclofenac sodium (VOLTAREN) 1 % GEL Apply 4 g topically 4 (four) times daily.  . ferrous sulfate 325 (65 FE) MG EC tablet Take 325 mg by mouth daily.  Marland Kitchen gabapentin (NEURONTIN) 400 MG capsule Take 1 capsule 3 x /day for Pain  . metoprolol tartrate (LOPRESSOR) 25 MG tablet TAKE 1 TABLET BY MOUTH ONCE DAILY  . pantoprazole (PROTONIX) 40 MG tablet Take 1 tablet Daily for Indigestion & Bleeding Ulcers  . triamterene-hydrochlorothiazide (DYAZIDE) 37.5-25 MG capsule Take 1 capsule Daily for Fluid Retention & Ankle Swelling  . Turmeric 500 MG TABS Take 1 tablet by mouth daily.   No current facility-administered medications on file prior to visit.   No Known Allergies   Past Medical History:  Diagnosis Date  . Arthritis   . Asthma   .  Benign neoplasm of sigmoid colon   . Hyperlipidemia   . Hypertension   . Migraines   . Pericardial effusion 02/04/2018  . Prediabetes   . Vertigo   . Vitamin D deficiency    Health Maintenance  Topic Date Due  . INFLUENZA VACCINE  10/15/2019  . TETANUS/TDAP  01/25/2024  . DEXA SCAN  Completed  . PNA vac Low Risk Adult  Completed   Immunization History  Administered Date(s) Administered  . DT (Pediatric) 01/24/2014  . Pneumococcal Conjugate-13 05/26/2018  . Pneumococcal Polysaccharide-23 10/06/2011  . Td 04/23/2003     Last EGD & Colon - 02/05/2018 - Dr Fuller Plan - No f/u due to age.  Last MGM - 09/15/2012 - refuses  further MGM.  Past Surgical History:  Procedure Laterality Date  . BIOPSY  02/05/2018   Procedure: BIOPSY;  Surgeon: Ladene Artist, MD;  Location: [facility];  Service: Endoscopy;;  . CATARACT EXTRACTION W/ INTRAOCULAR LENS  IMPLANT, BILATERAL  2009  . CEREBRAL ANEURYSM REPAIR    . COLONOSCOPY N/A 02/05/2018   Procedure: COLONOSCOPY;  Surgeon: Ladene Artist, MD;  Location: [facility];  Service: Endoscopy;  Laterality: N/A;  . ESOPHAGOGASTRODUODENOSCOPY N/A 02/05/2018   Procedure: ESOPHAGOGASTRODUODENOSCOPY (EGD);  Surgeon: Ladene Artist, MD;  Location: [facility];  Service: Endoscopy;  Laterality: N/A;  . FOOT SURGERY  1993  . POLYPECTOMY  02/05/2018   Procedure: POLYPECTOMY;  Surgeon: Ladene Artist, MD;  Location: [facility];  Service: Endoscopy;;  . TUBAL LIGATION     Family History  Problem Relation Age of Onset  . Hypertension Mother   . Diabetes Mother   . Cancer Mother        Pancreatic  . Cancer Father        Skin   Social History   Tobacco Use  . Smoking status: Former Smoker    Quit date: 08/07/1990    Years since quitting: 28.9  . Smokeless tobacco: Never Used  Substance Use Topics  . Alcohol use: No    Alcohol/week: 0.0 standard drinks  . Drug use: No    ROS Constitutional: Denies fever, chills, weight loss/gain, headaches, insomnia,  night sweats, and change in appetite. Does c/o fatigue. Eyes: Denies redness, blurred vision, diplopia, discharge, itchy, watery eyes.  ENT: Denies discharge, congestion, post nasal drip, epistaxis, sore throat, earache, hearing loss, dental pain, Tinnitus, Vertigo, Sinus pain, snoring.  Cardio: Denies chest pain, palpitations, irregular heartbeat, syncope, dyspnea, diaphoresis, orthopnea, PND, claudication, edema Respiratory: denies cough, dyspnea, DOE, pleurisy, hoarseness, laryngitis, wheezing.  Gastrointestinal: Denies dysphagia, heartburn, reflux, water brash, pain, cramps, nausea, vomiting, bloating,  diarrhea, constipation, hematemesis, melena, hematochezia, jaundice, hemorrhoids Genitourinary: Denies dysuria, frequency, urgency, nocturia, hesitancy, discharge, hematuria, flank pain Breast: Breast lumps, nipple discharge, bleeding.  Musculoskeletal: Denies arthralgia, myalgia, stiffness, Jt. Swelling, pain, limp, and strain/sprain. Denies falls. Skin: Denies puritis, rash, hives, warts, acne, eczema, changing in skin lesion Neuro: No weakness, tremor, incoordination, spasms, paresthesia, pain Psychiatric: Denies confusion, memory loss, sensory loss. Denies Depression. Endocrine: Denies change in weight, skin, hair change, nocturia, and paresthesia, diabetic polys, visual blurring, hyper / hypo glycemic episodes.  Heme/Lymph: No excessive bleeding, bruising, enlarged lymph nodes.  Physical Exam  BP 102/80   Pulse 72   Temp 97.8 F (36.6 C)   Resp 16   Ht 5\' 4"  (1.626 m)   Wt 155 lb 6.4 oz (70.5 kg)   BMI 26.67 kg/m   General Appearance: Well nourished, well groomed and in no apparent  distress.  Eyes: PERRLA, EOMs, conjunctiva no swelling or erythema, normal fundi and vessels. Sinuses: No frontal/maxillary tenderness ENT/Mouth: EACs patent / TMs  nl. Nares clear without erythema, swelling, mucoid exudates. Oral hygiene is good. No erythema, swelling, or exudate. Tongue normal, non-obstructing. Tonsils not swollen or erythematous. Hearing normal.  Neck: Supple, thyroid not palpable. No bruits, nodes or JVD. Respiratory: Respiratory effort normal.  BS equal and clear bilateral without rales, rhonci, wheezing or stridor. Cardio: Heart sounds are normal with regular rate and rhythm and no murmurs, rubs or gallops. Peripheral pulses are normal and equal bilaterally without edema. No aortic or femoral bruits. Chest: symmetric with normal excursions and percussion. Breasts: Symmetric, without lumps, nipple discharge, retractions, or fibrocystic changes.  Abdomen: Flat, soft with bowel  sounds active. Nontender, no guarding, rebound, hernias, masses, or organomegaly.  Lymphatics: Non tender without lymphadenopathy.  Genitourinary:  Musculoskeletal: Full ROM all peripheral extremities, joint stability, 5/5 strength, and normal gait. Skin: Warm and dry without rashes, lesions, cyanosis, clubbing or  ecchymosis.  Neuro: Cranial nerves intact, reflexes equal bilaterally. Normal muscle tone, no cerebellar symptoms. Sensation intact.  Pysch: Alert and oriented X 3, normal affect, Insight and Judgment appropriate.   Assessment and Plan  1. Annual Preventative Screening Examination  2. Essential hypertension  - EKG 12-Lead - Urinalysis, Routine w reflex microscopic - Microalbumin / creatinine urine ratio - CBC with Differential/Platelet - COMPLETE METABOLIC PANEL WITH GFR - Magnesium  3. Hyperlipidemia, mixed  - EKG 12-Lead - Lipid panel - TSH  4. Abnormal glucose  - EKG 12-Lead - Hemoglobin A1c - Insulin, random  5. Vitamin D deficiency  - VITAMIN D 25 Hydroxy  6. Paroxysmal atrial fibrillation ([name])  - EKG 12-Lead - TSH  7. Gastroesophageal reflux disease  - CBC with Differential/Platelet  8. Vertigo due to brain injury ([place])   9. FH: hypertension  - EKG 12-Lead  10. Former smoker  - EKG 12-Lead  11. Medication management - Urinalysis, Routine w reflex microscopic - Microalbumin / creatinine urine ratio - COMPLETE METABOLIC PANEL WITH GFR - Magnesium - Lipid panel - TSH - Hemoglobin A1c - Insulin, random - VITAMIN D 25 Hydroxy        Patient was counseled in prudent diet to achieve/maintain BMI less than 25 for weight control, BP monitoring, regular exercise and medications. Discussed med's effects and SE's. Screening labs and tests as requested with regular follow-up as recommended. Over 40 minutes of exam, counseling, chart review and high complex critical decision making was performed.   Kirtland Bouchard, MD

## 2019-06-29 ENCOUNTER — Other Ambulatory Visit: Payer: Self-pay | Admitting: Internal Medicine

## 2019-06-29 DIAGNOSIS — N6325 Unspecified lump in the left breast, overlapping quadrants: Secondary | ICD-10-CM

## 2019-06-29 DIAGNOSIS — Z803 Family history of malignant neoplasm of breast: Secondary | ICD-10-CM

## 2019-06-29 LAB — CBC WITH DIFFERENTIAL/PLATELET
Absolute Monocytes: 971 cells/uL — ABNORMAL HIGH (ref 200–950)
Basophils Absolute: 50 cells/uL (ref 0–200)
Basophils Relative: 0.6 %
Eosinophils Absolute: 58 cells/uL (ref 15–500)
Eosinophils Relative: 0.7 %
HCT: 40 % (ref 35.0–45.0)
Hemoglobin: 12.6 g/dL (ref 11.7–15.5)
Lymphs Abs: 1469 cells/uL (ref 850–3900)
MCH: 27 pg (ref 27.0–33.0)
MCHC: 31.5 g/dL — ABNORMAL LOW (ref 32.0–36.0)
MCV: 85.7 fL (ref 80.0–100.0)
MPV: 11.9 fL (ref 7.5–12.5)
Monocytes Relative: 11.7 %
Neutro Abs: 5752 cells/uL (ref 1500–7800)
Neutrophils Relative %: 69.3 %
Platelets: 432 10*3/uL — ABNORMAL HIGH (ref 140–400)
RBC: 4.67 10*6/uL (ref 3.80–5.10)
RDW: 15.3 % — ABNORMAL HIGH (ref 11.0–15.0)
Total Lymphocyte: 17.7 %
WBC: 8.3 10*3/uL (ref 3.8–10.8)

## 2019-06-29 LAB — HEMOGLOBIN A1C
Hgb A1c MFr Bld: 5.9 % of total Hgb — ABNORMAL HIGH (ref <5.7)
Mean Plasma Glucose: 123 (calc)
eAG (mmol/L): 6.8 (calc)

## 2019-06-29 LAB — URINALYSIS, ROUTINE W REFLEX MICROSCOPIC
Bilirubin Urine: NEGATIVE
Glucose, UA: NEGATIVE
Hgb urine dipstick: NEGATIVE
Nitrite: NEGATIVE
Specific Gravity, Urine: 1.021 (ref 1.001–1.03)
pH: <=5 (ref 5.0–8.0)

## 2019-06-29 LAB — COMPLETE METABOLIC PANEL WITH GFR
AG Ratio: 1.5 (calc) (ref 1.0–2.5)
ALT: 17 U/L (ref 6–29)
AST: 15 U/L (ref 10–35)
Albumin: 4 g/dL (ref 3.6–5.1)
Alkaline phosphatase (APISO): 113 U/L (ref 37–153)
BUN/Creatinine Ratio: 17 (calc) (ref 6–22)
BUN: 32 mg/dL — ABNORMAL HIGH (ref 7–25)
CO2: 28 mmol/L (ref 20–32)
Calcium: 9.7 mg/dL (ref 8.6–10.4)
Chloride: 100 mmol/L (ref 98–110)
Creat: 1.85 mg/dL — ABNORMAL HIGH (ref 0.60–0.93)
GFR, Est African American: 30 mL/min/{1.73_m2} — ABNORMAL LOW (ref >=60)
GFR, Est Non African American: 26 mL/min/{1.73_m2} — ABNORMAL LOW (ref >=60)
Globulin: 2.7 g/dL (calc) (ref 1.9–3.7)
Glucose, Bld: 59 mg/dL — ABNORMAL LOW (ref 65–99)
Potassium: 4.5 mmol/L (ref 3.5–5.3)
Sodium: 140 mmol/L (ref 135–146)
Total Bilirubin: 0.4 mg/dL (ref 0.2–1.2)
Total Protein: 6.7 g/dL (ref 6.1–8.1)

## 2019-06-29 LAB — MICROALBUMIN / CREATININE URINE RATIO
Creatinine, Urine: 218 mg/dL (ref 20–275)
Microalb Creat Ratio: 16 mcg/mg creat (ref <30)
Microalb, Ur: 3.4 mg/dL

## 2019-06-29 LAB — LIPID PANEL
Cholesterol: 150 mg/dL (ref <200)
HDL: 35 mg/dL — ABNORMAL LOW (ref >=50)
LDL Cholesterol (Calc): 85 mg/dL (calc)
Non-HDL Cholesterol (Calc): 115 mg/dL (calc) (ref <130)
Total CHOL/HDL Ratio: 4.3 (calc) (ref <5.0)
Triglycerides: 209 mg/dL — ABNORMAL HIGH (ref <150)

## 2019-06-29 LAB — INSULIN, RANDOM: Insulin: 16.2 u[IU]/mL

## 2019-06-29 LAB — TSH: TSH: 3.88 mIU/L (ref 0.40–4.50)

## 2019-06-29 LAB — MAGNESIUM: Magnesium: 2.2 mg/dL (ref 1.5–2.5)

## 2019-06-29 LAB — VITAMIN D 25 HYDROXY (VIT D DEFICIENCY, FRACTURES): Vit D, 25-Hydroxy: 56 ng/mL (ref 30–100)

## 2019-06-30 MED ORDER — CIPROFLOXACIN HCL 250 MG PO TABS: ORAL_TABLET | ORAL | 0 refills | Status: DC

## 2019-07-02 ENCOUNTER — Encounter: Payer: Self-pay | Admitting: Internal Medicine

## 2019-07-04 ENCOUNTER — Telehealth (INDEPENDENT_AMBULATORY_CARE_PROVIDER_SITE_OTHER): Payer: Self-pay

## 2019-08-04 DIAGNOSIS — M25551 Pain in right hip: Secondary | ICD-10-CM | POA: Diagnosis not present

## 2019-08-04 DIAGNOSIS — M48062 Spinal stenosis, lumbar region with neurogenic claudication: Secondary | ICD-10-CM | POA: Diagnosis not present

## 2019-08-04 DIAGNOSIS — M25561 Pain in right knee: Secondary | ICD-10-CM | POA: Diagnosis not present

## 2019-08-04 DIAGNOSIS — M79661 Pain in right lower leg: Secondary | ICD-10-CM | POA: Diagnosis not present

## 2019-08-16 DIAGNOSIS — M545 Low back pain: Secondary | ICD-10-CM | POA: Diagnosis not present

## 2019-08-16 DIAGNOSIS — M25551 Pain in right hip: Secondary | ICD-10-CM | POA: Diagnosis not present

## 2019-08-17 ENCOUNTER — Encounter: Payer: Self-pay | Admitting: Cardiology

## 2019-08-17 ENCOUNTER — Ambulatory Visit: Payer: PPO | Admitting: Cardiology

## 2019-08-17 ENCOUNTER — Other Ambulatory Visit: Payer: Self-pay

## 2019-08-17 VITALS — BP 153/79 | HR 85 | Resp 16 | Ht 64.0 in | Wt 158.0 lb

## 2019-08-17 DIAGNOSIS — I480 Paroxysmal atrial fibrillation: Secondary | ICD-10-CM | POA: Diagnosis not present

## 2019-08-17 DIAGNOSIS — I10 Essential (primary) hypertension: Secondary | ICD-10-CM | POA: Diagnosis not present

## 2019-08-17 DIAGNOSIS — N179 Acute kidney failure, unspecified: Secondary | ICD-10-CM

## 2019-08-17 DIAGNOSIS — Z01810 Encounter for preprocedural cardiovascular examination: Secondary | ICD-10-CM

## 2019-08-17 DIAGNOSIS — I2729 Other secondary pulmonary hypertension: Secondary | ICD-10-CM | POA: Diagnosis not present

## 2019-08-17 MED ORDER — APIXABAN 5 MG PO TABS: 5.0000 mg | ORAL_TABLET | Freq: Two times a day (BID) | ORAL | 3 refills | Status: DC

## 2019-08-17 NOTE — Progress Notes (Signed)
Primary Physician/Referring:  Unk Pinto, MD  Patient ID: Lori [name], female    DOB: 04-22-1940, 79 y.o.   MRN: [id number]  Chief Complaint  Patient presents with  . Shortness of Breath  . History of  A-Fib    Maternal Grandmother  . New Patient (Initial Visit)    Surgery Clearance   HPI:    Lori [name]  is a 79 y.o. Caucasian female with multiple medical comorbidity, has hypertension, hyperlipidemia, prediabetes mellitus, history of cerebral aneurysm SP coiling in 2001, paroxysmal atrial fibrillation, history of pulmonary embolism in 2019 and GI bleed related to severe esophagitis and gastritis from NSAID use.  She was discharged home on Eliquis after it was felt appropriate in view of cardioembolic risk.  Referred to [place] by Dr. Essie Christine for surgical clearance for right hip arthroplasty to be performed by Amada Jupiter, MD. Patient's activity level is markedly reduced due to arthritis.  She denies chest pain, dyspnea, PND or orthopnea or leg edema.  She has not had any recurrence of GI bleed.  She has discontinued Eliquis due to cost and presently on aspirin 81 mg twice daily.  Accompanied by her husband.  Past Medical History:  Diagnosis Date  . Arthritis   . Asthma   . Benign neoplasm of sigmoid colon   . Hyperlipidemia   . Hypertension   . Migraines   . Pericardial effusion 02/04/2018  . Prediabetes   . Vertigo   . Vitamin D deficiency    Past Surgical History:  Procedure Laterality Date  . BIOPSY  02/05/2018   Procedure: BIOPSY;  Surgeon: Ladene Artist, MD;  Location: [facility];  Service: Endoscopy;;  . CATARACT EXTRACTION W/ INTRAOCULAR LENS  IMPLANT, BILATERAL  2009  . CEREBRAL ANEURYSM REPAIR    . COLONOSCOPY N/A 02/05/2018   Procedure: COLONOSCOPY;  Surgeon: Ladene Artist, MD;  Location: [facility];  Service: Endoscopy;  Laterality: N/A;  . ESOPHAGOGASTRODUODENOSCOPY N/A 02/05/2018   Procedure: ESOPHAGOGASTRODUODENOSCOPY (EGD);  Surgeon: Ladene Artist, MD;  Location: [facility];  Service: Endoscopy;  Laterality: N/A;  . FOOT SURGERY  1993  . POLYPECTOMY  02/05/2018   Procedure: POLYPECTOMY;  Surgeon: Ladene Artist, MD;  Location: [facility];  Service: Endoscopy;;  . TUBAL LIGATION     Family History  Problem Relation Age of Onset  . Hypertension Mother   . Diabetes Mother   . Cancer Mother        Pancreatic  . Cancer Father        Skin    Social History   Tobacco Use  . Smoking status: Former Smoker    Quit date: 08/07/1990    Years since quitting: 29.0  . Smokeless tobacco: Never Used  Substance Use Topics  . Alcohol use: No    Alcohol/week: 0.0 standard drinks   Marital Status: Divorced  ROS  Review of Systems  Cardiovascular: Negative for dyspnea on exertion, leg swelling and syncope.  Musculoskeletal: Positive for back pain and joint pain.  Gastrointestinal: Negative for melena.   Objective  Blood pressure (!) 153/79, pulse 85, resp. rate 16, height 5\' 4"  (1.626 m), weight 158 lb (71.7 kg), SpO2 97 %.  Vitals with BMI 08/17/2019 06/28/2019 05/10/2019  Height 5\' 4"  5\' 4"  5\' 4"   Weight 158 lbs 155 lbs 6 oz 171 lbs 3 oz  BMI 27.11 [id number] 0000000  Systolic 0000000 A999333 0000000  Diastolic 79 80 68  Pulse 85 72 76  Physical Exam  Constitutional: She appears well-developed and well-nourished. No distress.  Cardiovascular: Normal rate, regular rhythm and intact distal pulses. Exam reveals no gallop.  No murmur heard. No leg edema, no JVD.   Pulmonary/Chest: Effort normal and breath sounds normal. No accessory muscle usage.  Abdominal: Soft. Bowel sounds are normal.   Laboratory examination:   Recent Labs    12/05/18 1225 03/07/19 1620 06/28/19 1337  NA 146 144 140  K 4.6 4.4 4.5  CL 106 109 100  CO2 30 27 28   GLUCOSE 101* 92 59*  BUN 23 18 32*  CREATININE 0.83 0.81 1.85*  CALCIUM 9.6 9.4 9.7  GFRNONAA 68 71 26*  GFRAA 79 82 30*   CrCl cannot be calculated (Patient's most recent lab result is  older than the maximum 21 days allowed.).  CMP Latest Ref Rng & Units 06/28/2019 03/07/2019 12/05/2018  Glucose 65 - 99 mg/dL 59(L) 92 101(H)  BUN 7 - 25 mg/dL 32(H) 18 23  Creatinine 0.60 - 0.93 mg/dL 1.85(H) 0.81 0.83  Sodium 135 - 146 mmol/L 140 144 146  Potassium 3.5 - 5.3 mmol/L 4.5 4.4 4.6  Chloride 98 - 110 mmol/L 100 109 106  CO2 20 - 32 mmol/L 28 27 30   Calcium 8.6 - 10.4 mg/dL 9.7 9.4 9.6  Total Protein 6.1 - 8.1 g/dL 6.7 5.8(L) 6.0(L)  Total Bilirubin 0.2 - 1.2 mg/dL 0.4 0.3 0.5  Alkaline Phos 33 - 130 U/L - - -  AST 10 - 35 U/L 15 9(L) 16  ALT 6 - 29 U/L 17 6 10    CBC Latest Ref Rng & Units 06/28/2019 03/07/2019 12/05/2018  WBC 3.8 - 10.8 Thousand/uL 8.3 6.6 8.2  Hemoglobin 11.7 - 15.5 g/dL 12.6 12.7 13.4  Hematocrit 35.0 - 45.0 % 40.0 38.6 40.4  Platelets 140 - 400 Thousand/uL 432(H) 258 354    Lipid Panel Lab Results  Component Value Date   CHOL 150 06/28/2019   CHOL 153 03/07/2019   CHOL 156 12/05/2018   Lab Results  Component Value Date   HDL 35 (L) 06/28/2019   HDL 37 (L) 03/07/2019   HDL 45 (L) 12/05/2018   Lab Results  Component Value Date   LDLCALC 85 06/28/2019   LDLCALC 88 03/07/2019   LDLCALC 90 12/05/2018   Lab Results  Component Value Date   TRIG 209 (H) 06/28/2019   TRIG 179 (H) 03/07/2019   TRIG 112 12/05/2018   Lab Results  Component Value Date   CHOLHDL 4.3 06/28/2019   CHOLHDL 4.1 03/07/2019   CHOLHDL 3.5 12/05/2018   No results found for: LDLDIRECT     Component Value Date/Time   CHOL 150 06/28/2019 1337   TRIG 209 (H) 06/28/2019 1337   HDL 35 (L) 06/28/2019 1337   CHOLHDL 4.3 06/28/2019 1337   VLDL 20 10/06/2016 1145   LDLCALC 85 06/28/2019 1337   Lipid Panel Recent Labs    12/05/18 1225 03/07/19 1620 06/28/19 1337  CHOL 156 153 150  TRIG 112 179* 209*  LDLCALC 90 88 85  HDL 45* 37* 35*  CHOLHDL 3.5 4.1 4.3    HEMOGLOBIN A1C Lab Results  Component Value Date   HGBA1C 5.9 (H) 06/28/2019   MPG 123 06/28/2019     TSH Recent Labs    12/05/18 1225 03/07/19 1620 06/28/19 1337  TSH 2.41 2.54 3.88    Medications and allergies  No Known Allergies   Current Outpatient Medications  Medication Instructions  . acetaminophen (TYLENOL) 650 mg,  Oral, Every 6 hours PRN  . [START ON 09/04/2019] apixaban (ELIQUIS) 5 mg, Oral, 2 times daily, Start Post hip replacement. Stop Aspirin after starting Eliquis  . aspirin EC 81 mg, Oral, 2 times daily  . Cholecalciferol 1,000 Units, Oral, Daily, Takes variable doses, 2-6 daily   . diazepam (VALIUM) 5 MG tablet Take 1/2 to 1 tablet 2 to 3 x /day for Dizziness / Vertigo  . ferrous sulfate 325 mg, Oral, Daily  . metoprolol tartrate (LOPRESSOR) 25 MG tablet TAKE 1 TABLET BY MOUTH ONCE DAILY  . pantoprazole (PROTONIX) 40 MG tablet Take 1 tablet Daily for Indigestion & Bleeding Ulcers   Radiology:    CT Chest 02/03/2018: 1. There is a an acute segmental pulmonary embolus within the left lower lobe pulmonary arteries. 2. No large area of pulmonary consolidation. 3. No mediastinal or hilar mass.  Cardiac Studies:   Lower Venous Study 02/04/2018: Indications: Pulmonary embolism. Right: There is no evidence of deep vein thrombosis in the lower extremity. No cystic structure found in the popliteal fossa.  Left: There is no evidence of deep vein thrombosis in the lower extremity. No cystic structure found in the popliteal fossa.   Echocardiogram 02/04/2018: - Left ventricle: The cavity size was normal. Wall thickness wasincreased in a pattern of mild LVH. Systolic function was normal.  The estimated ejection fraction was in the range of 60% to 65%.  Wall motion was normal; there were no regional wall motionabnormalities. Features are consistent with a pseudonormal left ventricular filling pattern, with concomitant abnormal relaxation and increased filling pressure (grade 2 diastolic dysfunction).  - Mitral valve: Calcified annulus. Moderately thickened leaflets.   There was mild regurgitation.  - Left atrium: The atrium was moderately dilated.  - Right ventricle: The cavity size was normal. Wall thickness was normal. Systolic function was normal.  - Right atrium: The atrium was moderately dilated.  - Tricuspid valve: There was mild regurgitation.  - Pulmonary arteries: Systolic pressure was moderately increased. PA peak pressure: 53 mm Hg (S).  - Pericardium, extracardiac: A trivial pericardial effusion was identified posterior to the heart.   EKG  EKG 08/17/2019: Normal sinus rhythm at rate of 82 bpm, left atrial enlargement, normal axis.  No evidence of ischemia.     Assessment     ICD-10-CM   1. Paroxysmal atrial fibrillation ([place]). CHA2DS2-VASc Score is 4.  Yearly risk of stroke: 4% (A, HTN, F).     I48.0 EKG 12-Lead    CBC    Basic metabolic panel    PCV MYOCARDIAL PERFUSION WITH LEXISCAN    apixaban (ELIQUIS) 5 MG TABS tablet  2. Pre-operative cardiovascular examination  Z01.810 CBC    PCV MYOCARDIAL PERFUSION WITH LEXISCAN  3. Essential hypertension  I10 PCV MYOCARDIAL PERFUSION WITH LEXISCAN  4. AKI (acute kidney injury) ([place])  N17.9   5. Other secondary pulmonary hypertension ([place])  I27.29 PCV ECHOCARDIOGRAM COMPLETE     Recommendations:   Lori [name]  is a 79 y.o. Caucasian female with multiple medical comorbidity, has hypertension, hyperlipidemia, prediabetes mellitus, history of cerebral aneurysm SP coiling in 2001, paroxysmal atrial fibrillation, history of pulmonary embolism in 2019 and GI bleed related to severe esophagitis and gastritis.  Referred to [place] for surgical clearance for right hip arthroplasty to be performed by Amada Jupiter.  I reviewed her extensive medical records from the hospitalization to now, her GI bleed was related to excessive use of NSAIDs due to arthritis.  She has not had any recurrence  of GI bleed, in view of high cardioembolic risk, she should be on anticoagulation.  After discussions with her and her  husband regarding risks and benefits, she agrees to go back on Eliquis.  However I noticed that her renal function recently had deteriorated suggestive of acute renal insufficiency probably due to NSAID use again due to arthritis, she needs to recheck her BMP today and I will also obtain a CBC as a preop evaluation.    I have not sent any prescription for Eliquis, once I have renal function I will decide upon anticoagulation.  Also if her surgery is going to be next few days, I rather wait before initiating anticoagulation therapy and we can start this postoperatively. (Rx sent but not to start until further instructions).  With regard to hypertension, blood pressure is elevated, but blood pressure x2 has been normal at PCPs office, suspect it may also be related to her arthritis and pain.  Hence I did not make any changes to her medications.  If she indeed needs antihypertensives, amlodipine will be the dose in view of renal insufficiency.  Schedule for a Lexiscan nuclear stress test to evaluate for myocardial ischemia. Patient unable to do treadmill stress testing due to arthritis. Will schedule for an echocardiogram specifically to follow-up on moderate to severe pulmonary hypertension that she had after pulmonary embolism.  This was a 65 minutes of office visit encounter including evaluation of medical records and making complex decisions.  Adrian Prows, MD, [facility] 08/17/2019, 6:06 PM [facility] [place] Pager: [phone] Office: [phone]  CC: Amada Jupiter, MD; Unk Pinto, MD

## 2019-08-21 ENCOUNTER — Other Ambulatory Visit: Payer: PPO

## 2019-08-21 ENCOUNTER — Other Ambulatory Visit: Payer: Self-pay | Admitting: Internal Medicine

## 2019-08-21 ENCOUNTER — Telehealth: Payer: Self-pay | Admitting: *Deleted

## 2019-08-21 DIAGNOSIS — Z79899 Other long term (current) drug therapy: Secondary | ICD-10-CM

## 2019-08-21 DIAGNOSIS — I10 Essential (primary) hypertension: Secondary | ICD-10-CM

## 2019-08-21 LAB — CBC WITH DIFFERENTIAL/PLATELET
Absolute Monocytes: 631 cells/uL (ref 200–950)
Basophils Absolute: 69 cells/uL (ref 0–200)
Basophils Relative: 0.9 %
Eosinophils Absolute: 31 cells/uL (ref 15–500)
Eosinophils Relative: 0.4 %
HCT: 42.9 % (ref 35.0–45.0)
Hemoglobin: 13.6 g/dL (ref 11.7–15.5)
Lymphs Abs: 1309 cells/uL (ref 850–3900)
MCH: 28.2 pg (ref 27.0–33.0)
MCHC: 31.7 g/dL — ABNORMAL LOW (ref 32.0–36.0)
MCV: 89 fL (ref 80.0–100.0)
MPV: 12.8 fL — ABNORMAL HIGH (ref 7.5–12.5)
Monocytes Relative: 8.2 %
Neutro Abs: 5660 cells/uL (ref 1500–7800)
Neutrophils Relative %: 73.5 %
Platelets: 303 10*3/uL (ref 140–400)
RBC: 4.82 10*6/uL (ref 3.80–5.10)
RDW: 15.8 % — ABNORMAL HIGH (ref 11.0–15.0)
Total Lymphocyte: 17 %
WBC: 7.7 10*3/uL (ref 3.8–10.8)

## 2019-08-21 LAB — BASIC METABOLIC PANEL WITH GFR
BUN/Creatinine Ratio: 22 (calc) (ref 6–22)
BUN: 24 mg/dL (ref 7–25)
CO2: 28 mmol/L (ref 20–32)
Calcium: 9.8 mg/dL (ref 8.6–10.4)
Chloride: 107 mmol/L (ref 98–110)
Creat: 1.07 mg/dL — ABNORMAL HIGH (ref 0.60–0.93)
GFR, Est African American: 58 mL/min/{1.73_m2} — ABNORMAL LOW (ref >=60)
GFR, Est Non African American: 50 mL/min/{1.73_m2} — ABNORMAL LOW (ref >=60)
Glucose, Bld: 169 mg/dL — ABNORMAL HIGH (ref 65–99)
Potassium: 4.5 mmol/L (ref 3.5–5.3)
Sodium: 142 mmol/L (ref 135–146)

## 2019-08-21 NOTE — Telephone Encounter (Signed)
Patient has upcoming hip replacement. She asked if Dr Melford Aase would order a BMP and CBC. She saw Dr Einar Gip for a cardio approval for the surgery, but was not able to go to [place] for the labs. Due to limited mobility, the patient asked to come here for her labs.  Dr Melford Aase approved the labs.

## 2019-08-22 ENCOUNTER — Encounter: Payer: Self-pay | Admitting: *Deleted

## 2019-08-30 ENCOUNTER — Ambulatory Visit: Payer: PPO

## 2019-08-30 ENCOUNTER — Other Ambulatory Visit: Payer: Self-pay

## 2019-08-30 DIAGNOSIS — Z01810 Encounter for preprocedural cardiovascular examination: Secondary | ICD-10-CM | POA: Diagnosis not present

## 2019-08-30 DIAGNOSIS — I480 Paroxysmal atrial fibrillation: Secondary | ICD-10-CM

## 2019-08-30 DIAGNOSIS — I10 Essential (primary) hypertension: Secondary | ICD-10-CM | POA: Diagnosis not present

## 2019-09-02 NOTE — Progress Notes (Signed)
Normal stress test

## 2019-09-04 ENCOUNTER — Encounter: Payer: Self-pay | Admitting: Cardiology

## 2019-09-13 ENCOUNTER — Other Ambulatory Visit: Payer: Self-pay

## 2019-09-13 ENCOUNTER — Ambulatory Visit: Payer: PPO

## 2019-09-13 DIAGNOSIS — I2729 Other secondary pulmonary hypertension: Secondary | ICD-10-CM | POA: Diagnosis not present

## 2019-09-26 ENCOUNTER — Encounter: Payer: Self-pay | Admitting: Orthopedic Surgery

## 2019-09-26 DIAGNOSIS — M25551 Pain in right hip: Secondary | ICD-10-CM | POA: Diagnosis not present

## 2019-09-26 DIAGNOSIS — M1611 Unilateral primary osteoarthritis, right hip: Secondary | ICD-10-CM | POA: Diagnosis present

## 2019-09-26 HISTORY — DX: Unilateral primary osteoarthritis, right hip: M16.11

## 2019-09-26 NOTE — H&P (Signed)
TOTAL HIP ADMISSION H&P  Patient is admitted for right total hip arthroplasty.  Subjective:  Chief Complaint: right hip pain  HPI: Lori [name], 79 y.o. female, has a history of pain and functional disability in the right hip(s) due to arthritis and patient has failed non-surgical conservative treatments for greater than 12 weeks to include NSAID's and/or analgesics, corticosteriod injections, flexibility and strengthening excercises, supervised PT with diminished ADL's post treatment, use of assistive devices and activity modification.  Onset of symptoms was gradual starting 9 years ago with gradually worsening course since that time.The patient noted no past surgery on the right hip(s).  Patient currently rates pain in the right hip at 10 out of 10 with activity. Patient has night pain, worsening of pain with activity and weight bearing, trendelenberg gait, pain that interfers with activities of daily living, crepitus and joint swelling. Patient has evidence of subchondral sclerosis, periarticular osteophytes and joint space narrowing by imaging studies. This condition presents safety issues increasing the risk of falls..  There is no current active infection.  Patient Active Problem List   Diagnosis Date Noted  . Primary localized osteoarthritis of right hip 09/26/2019  . Thrombophilia ([place]) 04/20/2019  . Spinal stenosis of lumbar region 04/20/2019  . Arthritis of both hands 04/20/2019  . History of iron deficiency anemia 03/08/2019  . Varicose vein of leg 09/01/2018  . Gastroesophageal reflux disease with esophagitis   . History of pulmonary embolism (01/2018) 02/04/2018  . Senile purpura ([place]) 07/20/2017  . Atrial fibrillation ([place]) 06/16/2017  . Vertigo due to brain injury ([place]) 02/06/2015  . Obesity (BMI 30.0-34.9) 02/06/2015  . Medication management 11/18/2013  . Hyperlipidemia, mixed   . Abnormal glucose   . Vitamin D deficiency   . Asthma   . Essential hypertension   .  Migraines    Past Medical History:  Diagnosis Date  . Arthritis   . Asthma   . Benign neoplasm of sigmoid colon   . Hyperlipidemia   . Hypertension   . Migraines   . Pericardial effusion 02/04/2018  . Prediabetes   . Primary localized osteoarthritis of right hip 09/26/2019  . Vertigo   . Vitamin D deficiency     Past Surgical History:  Procedure Laterality Date  . BIOPSY  02/05/2018   Procedure: BIOPSY;  Surgeon: Ladene Artist, MD;  Location: [facility];  Service: Endoscopy;;  . CATARACT EXTRACTION W/ INTRAOCULAR LENS  IMPLANT, BILATERAL  2009  . CEREBRAL ANEURYSM REPAIR    . COLONOSCOPY N/A 02/05/2018   Procedure: COLONOSCOPY;  Surgeon: Ladene Artist, MD;  Location: [facility];  Service: Endoscopy;  Laterality: N/A;  . ESOPHAGOGASTRODUODENOSCOPY N/A 02/05/2018   Procedure: ESOPHAGOGASTRODUODENOSCOPY (EGD);  Surgeon: Ladene Artist, MD;  Location: [facility];  Service: Endoscopy;  Laterality: N/A;  . FOOT SURGERY  1993  . POLYPECTOMY  02/05/2018   Procedure: POLYPECTOMY;  Surgeon: Ladene Artist, MD;  Location: [facility];  Service: Endoscopy;;  . TUBAL LIGATION      No current facility-administered medications for this encounter.   Current Outpatient Medications  Medication Sig Dispense Refill Last Dose  . acetaminophen (TYLENOL) 500 MG tablet Take 1,000 mg by mouth every 6 (six) hours as needed for mild pain or moderate pain.     Marland Kitchen apixaban (ELIQUIS) 5 MG TABS tablet Take 1 tablet (5 mg total) by mouth 2 (two) times daily. Start Post hip replacement. Stop Aspirin after starting Eliquis 60 tablet 3   . aspirin EC 81  MG tablet Take 81 mg by mouth daily.      . Cholecalciferol 25 MCG (1000 UT) capsule Take 1,000 Units by mouth daily.      . diazepam (VALIUM) 5 MG tablet Take 1/2 to 1 tablet 2 to 3 x /day for Dizziness / Vertigo (Patient taking differently: Take 5 mg by mouth daily as needed (Vertigo). for Dizziness / Vertigo) 90 tablet 0   . diclofenac Sodium  (VOLTAREN) 1 % GEL Apply 2 g topically daily as needed (pain).     . ferrous sulfate 325 (65 FE) MG EC tablet Take 325 mg by mouth 3 (three) times a week.      . metoprolol tartrate (LOPRESSOR) 25 MG tablet TAKE 1 TABLET BY MOUTH ONCE DAILY (Patient taking differently: Take 25 mg by mouth daily. ) 90 tablet 1   . pantoprazole (PROTONIX) 40 MG tablet Take 1 tablet Daily for Indigestion & Bleeding Ulcers (Patient taking differently: Take 40 mg by mouth daily. for Indigestion & Bleeding Ulcers) 90 tablet 3    No Known Allergies  Social History   Tobacco Use  . Smoking status: Former Smoker    Quit date: 08/07/1990    Years since quitting: 29.1  . Smokeless tobacco: Never Used  Substance Use Topics  . Alcohol use: No    Alcohol/week: 0.0 standard drinks    Family History  Problem Relation Age of Onset  . Hypertension Mother   . Diabetes Mother   . Cancer Mother        Pancreatic  . Cancer Father        Skin     Review of Systems  Constitutional: Negative.   HENT: Negative.   Eyes: Negative.   Respiratory: Negative.   Cardiovascular: Negative.   Gastrointestinal: Negative.   Endocrine: Negative.   Genitourinary: Negative.   Musculoskeletal: Positive for arthralgias, back pain, gait problem and joint swelling.  Allergic/Immunologic: Negative.   Hematological: Negative.   Psychiatric/Behavioral: Negative.     Objective:  Physical Exam Constitutional:      Appearance: Normal appearance. She is normal weight.  HENT:     Head: Normocephalic and atraumatic.     Right Ear: Tympanic membrane and external ear normal.     Left Ear: Tympanic membrane normal.     Nose: Nose normal.     Mouth/Throat:     Mouth: Mucous membranes are moist.     Pharynx: Oropharynx is clear.  Eyes:     Pupils: Pupils are equal, round, and reactive to light.  Cardiovascular:     Rate and Rhythm: Normal rate and regular rhythm.     Pulses: Normal pulses.  Pulmonary:     Effort: Pulmonary effort  is normal.     Breath sounds: Normal breath sounds.  Abdominal:     General: Bowel sounds are normal.     Palpations: Abdomen is soft.  Genitourinary:    Comments: Not pertinent to current symptomatology therefore not examined. Musculoskeletal:     Cervical back: Neck supple.     Comments: The right lower extremity has positive Stinchfield.  Pain with range of motion of the hip.  Neurovascularly intact.    Skin:    General: Skin is warm and dry.     Capillary Refill: Capillary refill takes less than 2 seconds.  Neurological:     General: No focal deficit present.     Mental Status: She is alert.  Psychiatric:        Mood and Affect:  Mood normal.        Behavior: Behavior normal.     Vital signs in last 24 hours: Temp:  [97.8 F (36.6 C)] 97.8 F (36.6 C) (07/13 1500) Pulse Rate:  [76] 76 (07/13 1500) BP: (170)/(83) 170/83 (07/13 1500) SpO2:  [98 %] 98 % (07/13 1500) Weight:  [71.7 kg] 71.7 kg (07/13 1500)  Labs:   Estimated body mass index is 27.12 kg/m as calculated from the following:   Height as of this encounter: 5\' 4"  (1.626 m).   Weight as of this encounter: 71.7 kg.   Imaging Review Plain radiographs demonstrate severe degenerative joint disease of the right hip(s). The bone quality appears to be good for age and reported activity level.      Assessment/Plan:  End stage arthritis, right hip(s)  The patient history, physical examination, clinical judgement of the provider and imaging studies are consistent with end stage degenerative joint disease of the right hip(s) and total hip arthroplasty is deemed medically necessary. The treatment options including medical management, injection therapy, arthroscopy and arthroplasty were discussed at length. The risks and benefits of total hip arthroplasty were presented and reviewed. The risks due to aseptic loosening, infection, stiffness, dislocation/subluxation,  thromboembolic complications and other imponderables  were discussed.  The patient acknowledged the explanation, agreed to proceed with the plan and consent was signed. Patient is being admitted for inpatient treatment for surgery, pain control, PT, OT, prophylactic antibiotics, VTE prophylaxis, progressive ambulation and ADL's and discharge planning.The patient is planning to be discharged home with home health services    Patient's anticipated LOS is less than 2 midnights, meeting these requirements: - Younger than 5 - Lives within 1 hour of care - Has a competent adult at home to recover with post-op recover - NO history of  - Chronic pain requiring opiods  - Diabetes  - Coronary Artery Disease  - Heart failure  - Heart attack  - Stroke  - DVT/VTE  - Cardiac arrhythmia  - Respiratory Failure/COPD  - Renal failure  - Anemia  - Advanced Liver disease

## 2019-09-28 ENCOUNTER — Encounter: Payer: Self-pay | Admitting: Cardiology

## 2019-09-28 ENCOUNTER — Other Ambulatory Visit: Payer: Self-pay | Admitting: Cardiology

## 2019-09-28 ENCOUNTER — Ambulatory Visit: Payer: PPO | Admitting: Cardiology

## 2019-09-28 ENCOUNTER — Other Ambulatory Visit: Payer: Self-pay

## 2019-09-28 VITALS — BP 127/68 | HR 66 | Resp 16 | Ht 64.0 in | Wt 160.0 lb

## 2019-09-28 DIAGNOSIS — Z01810 Encounter for preprocedural cardiovascular examination: Secondary | ICD-10-CM

## 2019-09-28 DIAGNOSIS — I480 Paroxysmal atrial fibrillation: Secondary | ICD-10-CM | POA: Diagnosis not present

## 2019-09-28 DIAGNOSIS — I10 Essential (primary) hypertension: Secondary | ICD-10-CM

## 2019-09-28 DIAGNOSIS — N1831 Chronic kidney disease, stage 3a: Secondary | ICD-10-CM

## 2019-09-28 DIAGNOSIS — I2781 Cor pulmonale (chronic): Secondary | ICD-10-CM

## 2019-09-28 NOTE — Progress Notes (Signed)
Primary Physician/Referring:  Unk Pinto, MD  Patient ID: Lori [name], female    DOB: [birth year], 79 y.o.   MRN: [id number]  Chief Complaint  Patient presents with  . Hypertension  . Atrial Fibrillation  . Follow-up    6 week   HPI:    Lori [name]  is a 79 y.o. Caucasian female with multiple medical comorbidity, has hypertension, hyperlipidemia, prediabetes mellitus, history of cerebral aneurysm SP coiling in 2001, paroxysmal atrial fibrillation jan 2019, history of pulmonary embolism in Nov 2019 and GI bleed related to severe esophagitis and gastritis from NSAID use.  She was discharged home on Eliquis after it was felt appropriate in view of cardioembolic risk.  Referred to [place] by Dr. Essie Christine for surgical clearance for right hip arthroplasty to be performed by Amada Jupiter, MD. she presents here after having undergone stress test and echocardiogram.  No new symptomatology.  Remains asymptomatic except for degenerative joint disease.  No leg edema, no dyspnea, no chest pain.  Past Medical History:  Diagnosis Date  . Arthritis   . Asthma   . Benign neoplasm of sigmoid colon   . Hyperlipidemia   . Hypertension   . Migraines   . Pericardial effusion 02/04/2018  . Prediabetes   . Primary localized osteoarthritis of right hip 09/26/2019  . Vertigo   . Vitamin D deficiency    Past Surgical History:  Procedure Laterality Date  . BIOPSY  02/05/2018   Procedure: BIOPSY;  Surgeon: Ladene Artist, MD;  Location: [facility];  Service: Endoscopy;;  . CATARACT EXTRACTION W/ INTRAOCULAR LENS  IMPLANT, BILATERAL  2009  . CEREBRAL ANEURYSM REPAIR    . COLONOSCOPY N/A 02/05/2018   Procedure: COLONOSCOPY;  Surgeon: Ladene Artist, MD;  Location: [facility];  Service: Endoscopy;  Laterality: N/A;  . ESOPHAGOGASTRODUODENOSCOPY N/A 02/05/2018   Procedure: ESOPHAGOGASTRODUODENOSCOPY (EGD);  Surgeon: Ladene Artist, MD;  Location: [facility];  Service: Endoscopy;  Laterality:  N/A;  . FOOT SURGERY  1993  . POLYPECTOMY  02/05/2018   Procedure: POLYPECTOMY;  Surgeon: Ladene Artist, MD;  Location: [facility];  Service: Endoscopy;;  . TUBAL LIGATION     Family History  Problem Relation Age of Onset  . Hypertension Mother   . Diabetes Mother   . Cancer Mother        Pancreatic  . Cancer Father        Skin    Social History   Tobacco Use  . Smoking status: Former Smoker    Types: Cigarettes    Quit date: 08/07/1990    Years since quitting: 29.1  . Smokeless tobacco: Never Used  Substance Use Topics  . Alcohol use: No    Alcohol/week: 0.0 standard drinks   Marital Status: Divorced   ROS  Review of Systems  Cardiovascular: Negative for dyspnea on exertion, leg swelling and syncope.  Musculoskeletal: Positive for back pain and joint pain.  Gastrointestinal: Negative for melena.   Objective  Blood pressure 127/68, pulse 66, resp. rate 16, height 5\' 4"  (1.626 m), weight 160 lb (72.6 kg), SpO2 95 %.  Vitals with BMI 09/28/2019 09/26/2019 08/17/2019  Height 5\' 4"  5\' 4"  5\' 4"   Weight 160 lbs 158 lbs 158 lbs  BMI 27.45 82.99 37.16  Systolic 967 893 810  Diastolic 68 83 79  Pulse 66 76 85     Physical Exam Constitutional:      General: She is not in acute distress.    Appearance:  She is well-developed.  Cardiovascular:     Rate and Rhythm: Normal rate and regular rhythm.     Pulses: Intact distal pulses.     Heart sounds: No murmur heard.  No gallop.      Comments: No leg edema, no JVD.  Pulmonary:     Effort: Pulmonary effort is normal. No accessory muscle usage.     Breath sounds: Normal breath sounds.  Abdominal:     General: Bowel sounds are normal.     Palpations: Abdomen is soft.    Laboratory examination:   Recent Labs    03/07/19 1620 06/28/19 1337 08/21/19 1430  NA 144 140 142  K 4.4 4.5 4.5  CL 109 100 107  CO2 27 28 28   GLUCOSE 92 59* 169*  BUN 18 32* 24  CREATININE 0.81 1.85* 1.07*  CALCIUM 9.4 9.7 9.8  GFRNONAA 71  26* 50*  GFRAA 82 30* 58*   CrCl cannot be calculated (Patient's most recent lab result is older than the maximum 21 days allowed.).  CMP Latest Ref Rng & Units 08/21/2019 06/28/2019 03/07/2019  Glucose 65 - 99 mg/dL 169(H) 59(L) 92  BUN 7 - 25 mg/dL 24 32(H) 18  Creatinine 0.60 - 0.93 mg/dL 1.07(H) 1.85(H) 0.81  Sodium 135 - 146 mmol/L 142 140 144  Potassium 3.5 - 5.3 mmol/L 4.5 4.5 4.4  Chloride 98 - 110 mmol/L 107 100 109  CO2 20 - 32 mmol/L 28 28 27   Calcium 8.6 - 10.4 mg/dL 9.8 9.7 9.4  Total Protein 6.1 - 8.1 g/dL - 6.7 5.8(L)  Total Bilirubin 0.2 - 1.2 mg/dL - 0.4 0.3  Alkaline Phos 33 - 130 U/L - - -  AST 10 - 35 U/L - 15 9(L)  ALT 6 - 29 U/L - 17 6   CBC Latest Ref Rng & Units 08/21/2019 06/28/2019 03/07/2019  WBC 3.8 - 10.8 Thousand/uL 7.7 8.3 6.6  Hemoglobin 11.7 - 15.5 g/dL 13.6 12.6 12.7  Hematocrit 35 - 45 % 42.9 40.0 38.6  Platelets 140 - 400 Thousand/uL 303 432(H) 258   Lipid Panel Recent Labs    12/05/18 1225 03/07/19 1620 06/28/19 1337  CHOL 156 153 150  TRIG 112 179* 209*  LDLCALC 90 88 85  HDL 45* 37* 35*  CHOLHDL 3.5 4.1 4.3    HEMOGLOBIN A1C Lab Results  Component Value Date   HGBA1C 5.9 (H) 06/28/2019   MPG 123 06/28/2019   TSH Recent Labs    12/05/18 1225 03/07/19 1620 06/28/19 1337  TSH 2.41 2.54 3.88    Medications and allergies  No Known Allergies   Current Outpatient Medications  Medication Instructions  . acetaminophen (TYLENOL) 1,000 mg, Oral, Every 6 hours PRN  . apixaban (ELIQUIS) 5 mg, Oral, 2 times daily, Start Post hip replacement. Stop Aspirin after starting Eliquis  . aspirin EC 81 mg, Oral, Daily  . Cholecalciferol 1,000 Units, Oral, Daily  . diazepam (VALIUM) 5 MG tablet Take 1/2 to 1 tablet 2 to 3 x /day for Dizziness / Vertigo  . ferrous sulfate 325 mg, Oral, 3 times weekly  . metoprolol tartrate (LOPRESSOR) 25 MG tablet TAKE 1 TABLET BY MOUTH ONCE DAILY  . pantoprazole (PROTONIX) 40 MG tablet Take 1 tablet Daily for  Indigestion & Bleeding Ulcers   Radiology:    CT Chest 02/03/2018: 1. There is a an acute segmental pulmonary embolus within the left lower lobe pulmonary arteries. 2. No large area of pulmonary consolidation. 3. No mediastinal or hilar  mass.  Cardiac Studies:   Lower Venous Study 02/04/2018: Indications: Pulmonary embolism. Right: There is no evidence of deep vein thrombosis in the lower extremity. No cystic structure found in the popliteal fossa.  Left: There is no evidence of deep vein thrombosis in the lower extremity. No cystic structure found in the popliteal fossa.   Lexiscan Tetrofosmin stress test 08/30/2019: Lexiscan nuclear stress test performed using 1-day protocol. Stress EKG is non-diagnostic, as this is pharmacological stress test. Normal myocardial perfusion. Stress LVEF 58%. Low risk study.   Echocardiogram 09/13/2019:  Normal LV systolic function with EF 69%. Left ventricle cavity is normal  in size. Moderate concentric hypertrophy of the left ventricle. Normal  global wall motion. Doppler evidence of grade II (pseudonormal) diastolic  dysfunction, elevated LAP. Calculated EF 69%.  Left atrial cavity is severely dilated  by volume.  Right atrial cavity is moderately dilated.  Right ventricle cavity is mildly dilated. Mild concentric hypertrophy of  the right ventricle. Normal right ventricular function.  Structurally normal tricuspid valve.  Moderate to severe tricuspid  regurgitation. Moderate pulmonary hypertension. RVSP measures 59 mmHg.  Insignificant pericardial effusion with clear fluids.  IVC is dilated with blunted respiratory response. CVP estimated at 15 mm  Hg.  No significant change from 02/04/2018 report.    EKG  EKG 08/17/2019: Normal sinus rhythm at rate of 82 bpm, left atrial enlargement, normal axis.  No evidence of ischemia.     EKG 04/09/2017: Atrial fibrillation with rapid ventricular response.  Nonspecific ST-T abnormality.  Assessment      ICD-10-CM   1. Paroxysmal atrial fibrillation ([place]). CHA2DS2-VASc Score is 4.  Yearly risk of stroke: 4% (A, HTN, F).     I48.0   2. Pre-operative cardiovascular examination  Z01.810   3. Essential hypertension  I10   4. Stage 3a chronic kidney disease  N18.31   5. Cor pulmonale (chronic) ([name])  I27.81 PCV ECHOCARDIOGRAM COMPLETE     Recommendations:   Lori [name]  is a 79 y.o. Caucasian female with multiple medical comorbidity, has hypertension, hyperlipidemia, prediabetes mellitus, history of cerebral aneurysm SP coiling in 2001, paroxysmal atrial fibrillation first documented in January 2019, history of pulmonary embolism in 2019 and GI bleed related to severe esophagitis and gastritis from NSAID use.  Referred to [place] for surgical clearance for right hip arthroplasty to be performed by Amada Jupiter.   She has been set up for right hip arthroplasty on 10/17/2019.  She does have stage III chronic kidney disease that has remained stable.  I advised her that she should be on anticoagulation in view of high cardioembolic risk however as she has not been on Eliquis for several months, advised her to restart this after her surgery.  Other option is to restart now and to hold it 2 days to 3 days before her surgery.  Her blood pressure is slightly elevated today but overall much improved from last time.  She is presently on metoprolol 25 mg p.o. twice daily, continue the same.  I reviewed her echocardiogram, she does have moderate pulmonary hypertension, suspect this is related to cor pulmonale from prior tobacco use.  Today there is no clinical evidence of right-sided heart failure.  I would like to see her back in 1 year for follow-up of atrial fibrillation and also pulmonary hypertension and cor pulmonale and if she remains stable I will see her back on a as needed basis. I will repeat Echo in a year   Adrian Prows, MD,  [facility] 09/28/2019, 3:56 PM Office: [phone]   CC: Amada Jupiter, MD; Unk Pinto, MD

## 2019-09-29 NOTE — Progress Notes (Signed)
MEDICARE ANNUAL WELLNESS VISIT AND 3 MONTH FOLLOW UP  Assessment:   Encounter for Medicare annual wellness exam 1 YEAR  Essential hypertension - continue medications, DASH diet, exercise and monitor at home. Call if greater than 130/80.  -     CBC with Differential/Platelet -     COMPLETE METABOLIC PANEL WITH GFR -     TSH  Chronic atrial fibrillation ([name])/ thrombophilia ([name]) rate controlled, on NOAC for chadsvasc of 4, and metoprolol  Dr. Einar Gip following  History of PE On elequis (restart after surgery); continue due to a. Fib; no concerns with excess bleeding  Vertigo due to brain injury ([place]) Valium PRN  Mixed hyperlipidemia -   -continue medications, check lipids, decrease fatty foods, increase activity.   Medication management  Iron deficiency due to chronic blood loss  Monitor CBC, iron panel; anticipate chronic iron replacement needs secondary to esophagitis and cameron's lesion  Abnormal glucose Monitor  Vitamin D deficiency Continue supplement   Overweight  - long discussion about weight loss, diet, and exercise -recommended diet heavy in fruits and veggies and low in animal meats, cheeses, and dairy products  Senile purpura ([place]) R/t elequis and age; Discussed process, protect skin, sunscreen  Varicose veins - weight loss discussed, continue compression stockings and elevation  GERD/cameron lesions Anticipate PPI and iron supplement indefinitely per GI Monitor magnesium, vitamin D Avoid NSAIDs  Goals of care Patient declines most exam/vaccines- states want to enjoy what times he has and would declines any treatments. Advanced directive/living will paperwork provided today.   Over 40 minutes of exam, counseling, chart review and critical decision making was performed Future Appointments  Date Time Provider [place]  10/06/2019 10:00 AM WL-PADML PAT 1 WL-PADML None  01/02/2020  2:30 PM Unk Pinto, MD GA[facility]-GAAIM None  07/02/2020   2:00 PM Unk Pinto, MD GAAM-GAAIM None  09/24/2020 11:30 AM PCV-ECHO/VAS 1 PCV-IMG None  10/03/2020  1:00 PM Adrian Prows, MD PCV-PCV None     Plan:   During the course of the visit the patient was educated and counseled about appropriate screening and preventive services including:    Pneumococcal vaccine   Prevnar 13  Influenza vaccine  Td vaccine  Screening electrocardiogram  Bone densitometry screening  Colorectal cancer screening  Diabetes screening  Glaucoma screening  Nutrition counseling   Advanced directives: requested   Subjective:  Lori [name] is a 79 y.o. female who presents for Medicare Annual Wellness Visit and 3 month follow up  She has R hip TAH planned with Dr. Percell Miller 10/17/2019. Uses cane to walk.  Patient was hospitalized in Nov 2019  with a severe GI bleed (hgb 5 gm%) requiring  pRBC transfusions and found to have esophagitis, erosive gastropathy, Cameron lesion and a small colon polyp & was initiated on PPI. CBCs and iron levels have since resolved to normal range, continues on daily iron.   She takes CBD oil PRN anxiety and LE pain, uses valium rarelly, tylenol as well for pain with aleve occasionally and reports she is managing well for vertigo and anxiety.   BMI is Body mass index is 27.15 kg/m., she has not been working on diet and exercise. Wt Readings from Last 3 Encounters:  10/02/19 158 lb 3.2 oz (71.8 kg)  09/28/19 160 lb (72.6 kg)  08/17/19 158 lb (71.7 kg)   Her blood pressure has been controlled at home, today their BP is BP: 136/74  She does not workout but is very active with 35 dogs and  goats. She denies chest pain, shortness of breath, dizziness. In 2009, she had a false (+) Myoview confirmed by a Negative Heart Cath.  She has history of Afib, CHA2DS2VASC 4, declined anticoag/NOAC for many years until recent PE and now doing well on elequis, but is off due to upcoming hip replacement per discussion with Dr. Einar Gip.    She is  not on cholesterol medication and denies myalgias. Her LDL cholesterol is at goal. The cholesterol last visit was:   Lab Results  Component Value Date   CHOL 150 06/28/2019   HDL 35 (L) 06/28/2019   LDLCALC 85 06/28/2019   TRIG 209 (H) 06/28/2019   CHOLHDL 4.3 06/28/2019    She has been working on diet and exercise for prediabetes, and denies polydipsia, polyuria and visual disturbances. Last A1C in the office was:  Lab Results  Component Value Date   HGBA1C 5.9 (H) 06/28/2019   Recently had episode of AKI that did improve with cessation of diuretic/NSAID and advised increased water, admits 2-3 bottles of water, 2 bottles of pepsi  Lab Results  Component Value Date   GFRNONAA 50 (L) 08/21/2019   GFRNONAA 26 (L) 06/28/2019   GFRNONAA 71 03/07/2019   Lab Results  Component Value Date   CREATININE 1.07 (H) 08/21/2019   CREATININE 1.85 (H) 06/28/2019   CREATININE 0.81 03/07/2019   Patient is on Vitamin D supplement.   Lab Results  Component Value Date   VD25OH 56 06/28/2019      Medication Review: Current Outpatient Medications on File Prior to Visit  Medication Sig Dispense Refill   acetaminophen (TYLENOL) 500 MG tablet Take 1,000 mg by mouth every 6 (six) hours as needed for mild pain or moderate pain.     apixaban (ELIQUIS) 5 MG TABS tablet Take 1 tablet (5 mg total) by mouth 2 (two) times daily. Start Post hip replacement. Stop Aspirin after starting Eliquis 60 tablet 3   Cholecalciferol 25 MCG (1000 UT) capsule Take 1,000 Units by mouth daily.      diazepam (VALIUM) 5 MG tablet Take 1/2 to 1 tablet 2 to 3 x /day for Dizziness / Vertigo (Patient taking differently: Take 5 mg by mouth daily as needed (Vertigo). for Dizziness / Vertigo) 90 tablet 0   ferrous sulfate 325 (65 FE) MG EC tablet Take 325 mg by mouth 3 (three) times a week.      metoprolol tartrate (LOPRESSOR) 25 MG tablet TAKE 1 TABLET BY MOUTH ONCE DAILY (Patient taking differently: Take 25 mg by mouth daily.  ) 90 tablet 1   pantoprazole (PROTONIX) 40 MG tablet Take 1 tablet Daily for Indigestion & Bleeding Ulcers (Patient taking differently: Take 40 mg by mouth daily. for Indigestion & Bleeding Ulcers) 90 tablet 3   aspirin EC 81 MG tablet Take 81 mg by mouth daily.  (Patient not taking: Reported on 10/02/2019)     No current facility-administered medications on file prior to visit.    No Known Allergies  Current Problems (verified) Patient Active Problem List   Diagnosis Date Noted   Primary localized osteoarthritis of right hip 09/26/2019   Thrombophilia ([place]) 04/20/2019   Spinal stenosis of lumbar region 04/20/2019   Arthritis of both hands 04/20/2019   Iron deficiency anemia due to chronic blood loss 03/08/2019   Varicose vein of leg 09/01/2018   Gastroesophageal reflux disease with esophagitis    History of pulmonary embolism (01/2018) 02/04/2018   Senile purpura ([place]) 07/20/2017   Atrial fibrillation ([place]) 06/16/2017  Vertigo due to brain injury ([place]) 02/06/2015   Obesity (BMI 30.0-34.9) 02/06/2015   Medication management 11/18/2013   Hyperlipidemia, mixed    Abnormal glucose    Vitamin D deficiency    Essential hypertension    Screening Tests Immunization History  Administered Date(s) Administered   DT (Pediatric) 01/24/2014   Pneumococcal Conjugate-13 05/26/2018   Pneumococcal Polysaccharide-23 10/06/2011   Td 04/23/2003   Preventative care: Last colonoscopy: 01/2018 no further follow up secondary to age per Dr. Fuller Plan Last EGD: 01/2018 - esophagitis, cameron lesions, anticipate chronic iron loss Last mammogram: 09/2012 - she states if she did have cancer she would not want treatment Last pap smear/pelvic exam: 2009 DEXA: 2011 normal, declines further  Prior vaccinations: TD  2015  Influenza: declines Pneumococcal: 2013 Prevnar13: 2020 Shingles/Zostavax: declines Covid 19: declines   Hep C: screening done today with patient  permission  Names of Other Physician/Practitioners you currently use: 1.  [facility] here for primary care 2. [place], eye doctor, last visit June 2020, has scheduled in 04/2020 3. Needs one, dentist, remote, encouraged to schedule   Patient Care Team: Unk Pinto, MD as PCP - General (Internal Medicine) Unk Pinto, MD as PCP - Internal Medicine (Internal Medicine) Clent Jacks, MD as Consulting Physician (Ophthalmology) Ladene Artist, MD as Consulting Physician (Gastroenterology)  SURGICAL HISTORY She  has a past surgical history that includes Tubal ligation; Cerebral aneurysm repair; Cataract extraction w/ intraocular lens  implant, bilateral (2009); Foot surgery (1993); Colonoscopy (N/A, 02/05/2018); Esophagogastroduodenoscopy (N/A, 02/05/2018); biopsy (02/05/2018); and polypectomy (02/05/2018). FAMILY HISTORY Her family history includes Cancer in her father and mother; Diabetes in her mother; Hypertension in her mother. SOCIAL HISTORY She  reports that she quit smoking about 29 years ago. Her smoking use included cigarettes. She has never used smokeless tobacco. She reports that she does not drink alcohol and does not use drugs.   MEDICARE WELLNESS OBJECTIVES: Physical activity: Current Exercise Habits: Home exercise routine, Type of exercise: stretching, Time (Minutes): 10, Frequency (Times/Week): 4, Weekly Exercise (Minutes/Week): 40, Intensity: Mild, Exercise limited by: orthopedic condition(s) Cardiac risk factors: Cardiac Risk Factors include: advanced age (>78men, >22 women);dyslipidemia;hypertension;sedentary lifestyle;smoking/ tobacco exposure Depression/mood screen:   Depression screen [facility] 2/9 10/02/2019  Decreased Interest 0  Down, Depressed, Hopeless 0  PHQ - 2 Score 0    ADLs:  In your present state of health, do you have any difficulty performing the following activities: 10/02/2019 07/02/2019  Hearing? N N  Comment  R ear deafness, manages -  Vision? N N  Difficulty concentrating or making decisions? N N  Walking or climbing stairs? N N  Comment due for hip replacement, walking with cane -  Dressing or bathing? N N  Doing errands, shopping? N N  Some recent data might be hidden     Cognitive Testing  Alert? Yes  Normal Appearance?Yes  Oriented to person? Yes  Place? Yes   Time? Yes  Recall of three objects?  Yes  Can perform simple calculations? Yes  Displays appropriate judgment?Yes  Can read the correct time from a watch face?Yes  EOL planning: Does Patient Have a Medical Advance Directive?: No Would patient like information on creating a medical advance directive?: Yes (MAU/Ambulatory/Procedural Areas - Information given)  Review of Systems  Constitutional: Negative.  Negative for malaise/fatigue and weight loss.  HENT: Negative.  Negative for hearing loss and tinnitus.   Eyes: Negative.  Negative for blurred vision and double vision.  Respiratory: Negative.  Negative  for cough, sputum production, shortness of breath and wheezing.   Cardiovascular: Negative.  Negative for chest pain, palpitations, orthopnea, claudication, leg swelling and PND.  Gastrointestinal: Negative.  Negative for abdominal pain, blood in stool, constipation, diarrhea, heartburn, melena, nausea and vomiting.  Genitourinary: Negative.   Musculoskeletal: Positive for joint pain (R hip, bil wrists using canes). Negative for falls and myalgias.  Skin: Negative.  Negative for rash.  Neurological: Negative for dizziness, tingling, sensory change, weakness and headaches.  Endo/Heme/Allergies: Negative for polydipsia.  Psychiatric/Behavioral: Negative.  Negative for depression, memory loss, substance abuse and suicidal ideas. The patient is not nervous/anxious and does not have insomnia.   All other systems reviewed and are negative.    Objective:     Today's Vitals   10/02/19 1117  BP: 136/74  Pulse: 63  Temp: (!)  97.3 F (36.3 C)  SpO2: 96%  Weight: 158 lb 3.2 oz (71.8 kg)  Height: 5\' 4"  (1.626 m)   Body mass index is 27.15 kg/m.  General appearance: alert, no distress, WD/WN, female HEENT: normocephalic, sclerae anicteric, TMs pearly, nares patent, no discharge or erythema, pharynx normal Oral cavity: MMM, no lesions Neck: supple, no lymphadenopathy, no thyromegaly, no masses Heart: RRR, normal S1, S2, no murmurs Lungs: CTA bilaterally, no wheezes, rhonchi, or rales Abdomen: +bs, soft, non tender, non distended, no masses, no hepatomegaly, no splenomegaly Musculoskeletal: nontender, no swelling, very slow sitting to standing, slow antalgic gait, bil wrists with bony enlargement without effusion Extremities: no edema, no cyanosis, no clubbing, numerous superficial varicose veins of bilateral lower legs Pulses: 2+ symmetric, upper and lower extremities, normal cap refill Neurological: alert, oriented x 3, CN2-12 intact, strength normal upper extremities and lower extremities, sensation normal throughout, DTRs 2+ throughout, no cerebellar signs, gait antalgic Psychiatric: normal affect, behavior normal, pleasant   Medicare Attestation I have personally reviewed: The patient's medical and social history Their use of alcohol, tobacco or illicit drugs Their current medications and supplements The patient's functional ability including ADLs,fall risks, home safety risks, cognitive, and hearing and visual impairment Diet and physical activities Evidence for depression or mood disorders  The patient's weight, height, BMI, and visual acuity have been recorded in the chart.  I have made referrals, counseling, and provided education to the patient based on review of the above and I have provided the patient with a written personalized care plan for preventive services.     Izora Ribas, NP   10/02/2019

## 2019-10-02 ENCOUNTER — Ambulatory Visit (INDEPENDENT_AMBULATORY_CARE_PROVIDER_SITE_OTHER): Payer: PPO | Admitting: Adult Health

## 2019-10-02 ENCOUNTER — Other Ambulatory Visit: Payer: Self-pay

## 2019-10-02 ENCOUNTER — Encounter: Payer: Self-pay | Admitting: Adult Health

## 2019-10-02 VITALS — BP 136/74 | HR 63 | Temp 97.3°F | Ht 64.0 in | Wt 158.2 lb

## 2019-10-02 DIAGNOSIS — I83813 Varicose veins of bilateral lower extremities with pain: Secondary | ICD-10-CM

## 2019-10-02 DIAGNOSIS — M48061 Spinal stenosis, lumbar region without neurogenic claudication: Secondary | ICD-10-CM

## 2019-10-02 DIAGNOSIS — E782 Mixed hyperlipidemia: Secondary | ICD-10-CM

## 2019-10-02 DIAGNOSIS — R7309 Other abnormal glucose: Secondary | ICD-10-CM | POA: Diagnosis not present

## 2019-10-02 DIAGNOSIS — M19041 Primary osteoarthritis, right hand: Secondary | ICD-10-CM | POA: Diagnosis not present

## 2019-10-02 DIAGNOSIS — Z86711 Personal history of pulmonary embolism: Secondary | ICD-10-CM | POA: Diagnosis not present

## 2019-10-02 DIAGNOSIS — Z0001 Encounter for general adult medical examination with abnormal findings: Secondary | ICD-10-CM

## 2019-10-02 DIAGNOSIS — D500 Iron deficiency anemia secondary to blood loss (chronic): Secondary | ICD-10-CM

## 2019-10-02 DIAGNOSIS — K2101 Gastro-esophageal reflux disease with esophagitis, with bleeding: Secondary | ICD-10-CM

## 2019-10-02 DIAGNOSIS — D692 Other nonthrombocytopenic purpura: Secondary | ICD-10-CM | POA: Diagnosis not present

## 2019-10-02 DIAGNOSIS — I10 Essential (primary) hypertension: Secondary | ICD-10-CM | POA: Diagnosis not present

## 2019-10-02 DIAGNOSIS — E669 Obesity, unspecified: Secondary | ICD-10-CM

## 2019-10-02 DIAGNOSIS — E559 Vitamin D deficiency, unspecified: Secondary | ICD-10-CM | POA: Diagnosis not present

## 2019-10-02 DIAGNOSIS — I480 Paroxysmal atrial fibrillation: Secondary | ICD-10-CM

## 2019-10-02 DIAGNOSIS — Z0000 Encounter for general adult medical examination without abnormal findings: Secondary | ICD-10-CM

## 2019-10-02 DIAGNOSIS — D6859 Other primary thrombophilia: Secondary | ICD-10-CM | POA: Diagnosis not present

## 2019-10-02 DIAGNOSIS — R42 Dizziness and giddiness: Secondary | ICD-10-CM

## 2019-10-02 DIAGNOSIS — Z1159 Encounter for screening for other viral diseases: Secondary | ICD-10-CM

## 2019-10-02 DIAGNOSIS — R6889 Other general symptoms and signs: Secondary | ICD-10-CM | POA: Diagnosis not present

## 2019-10-02 DIAGNOSIS — Z5320 Procedure and treatment not carried out because of patient's decision for unspecified reasons: Secondary | ICD-10-CM

## 2019-10-02 DIAGNOSIS — Z79899 Other long term (current) drug therapy: Secondary | ICD-10-CM | POA: Diagnosis not present

## 2019-10-02 NOTE — Patient Instructions (Addendum)
  Lori [name] , Thank you for taking time to come for your Medicare Wellness Visit. I appreciate your ongoing commitment to your health goals. Please review the following plan we discussed and let me know if I can assist you in the future.   These are the goals we discussed: Goals    . DIET - INCREASE WATER INTAKE     4+ bottles daily, reduce pepsi    . Exercise 150 min/wk Moderate Activity    . Weight (lb) < 165 lb (74.8 kg)       This is a list of the screening recommended for you and due dates:  Health Maintenance  Topic Date Due  .  Hepatitis C: One time screening is recommended by Center for Disease Control  (CDC) for  adults born from [birth year] through 1963.   Never done  . COVID-19 Vaccine (1) 10/18/2019*  . Flu Shot  10/15/2019  . Tetanus Vaccine  01/25/2024  . DEXA scan (bone density measurement)  Completed  . Pneumonia vaccines  Completed  *Topic was postponed. The date shown is not the original due date.    Please be sure to restart eqiquis following hip surgery as recommended by Dr. Einar Gip to prevent clots - stroke, clot in lung

## 2019-10-03 LAB — COMPLETE METABOLIC PANEL WITH GFR
AG Ratio: 2 (calc) (ref 1.0–2.5)
ALT: 7 U/L (ref 6–29)
AST: 12 U/L (ref 10–35)
Albumin: 4.3 g/dL (ref 3.6–5.1)
Alkaline phosphatase (APISO): 89 U/L (ref 37–153)
BUN/Creatinine Ratio: 19 (calc) (ref 6–22)
BUN: 19 mg/dL (ref 7–25)
CO2: 28 mmol/L (ref 20–32)
Calcium: 9.7 mg/dL (ref 8.6–10.4)
Chloride: 110 mmol/L (ref 98–110)
Creat: 0.99 mg/dL — ABNORMAL HIGH (ref 0.60–0.93)
GFR, Est African American: 64 mL/min/{1.73_m2} (ref >=60)
GFR, Est Non African American: 55 mL/min/{1.73_m2} — ABNORMAL LOW (ref >=60)
Globulin: 2.1 g/dL (calc) (ref 1.9–3.7)
Glucose, Bld: 108 mg/dL — ABNORMAL HIGH (ref 65–99)
Potassium: 4 mmol/L (ref 3.5–5.3)
Sodium: 146 mmol/L (ref 135–146)
Total Bilirubin: 0.4 mg/dL (ref 0.2–1.2)
Total Protein: 6.4 g/dL (ref 6.1–8.1)

## 2019-10-03 LAB — CBC WITH DIFFERENTIAL/PLATELET
Absolute Monocytes: 574 cells/uL (ref 200–950)
Basophils Absolute: 52 cells/uL (ref 0–200)
Basophils Relative: 0.9 %
Eosinophils Absolute: 41 cells/uL (ref 15–500)
Eosinophils Relative: 0.7 %
HCT: 41 % (ref 35.0–45.0)
Hemoglobin: 13.5 g/dL (ref 11.7–15.5)
Lymphs Abs: 1253 cells/uL (ref 850–3900)
MCH: 29.5 pg (ref 27.0–33.0)
MCHC: 32.9 g/dL (ref 32.0–36.0)
MCV: 89.7 fL (ref 80.0–100.0)
MPV: 12.9 fL — ABNORMAL HIGH (ref 7.5–12.5)
Monocytes Relative: 9.9 %
Neutro Abs: 3880 cells/uL (ref 1500–7800)
Neutrophils Relative %: 66.9 %
Platelets: 217 10*3/uL (ref 140–400)
RBC: 4.57 10*6/uL (ref 3.80–5.10)
RDW: 14.5 % (ref 11.0–15.0)
Total Lymphocyte: 21.6 %
WBC: 5.8 10*3/uL (ref 3.8–10.8)

## 2019-10-03 LAB — MAGNESIUM: Magnesium: 2.1 mg/dL (ref 1.5–2.5)

## 2019-10-03 LAB — TSH: TSH: 2.65 mIU/L (ref 0.40–4.50)

## 2019-10-03 LAB — LIPID PANEL
Cholesterol: 162 mg/dL (ref <200)
HDL: 44 mg/dL — ABNORMAL LOW (ref >=50)
LDL Cholesterol (Calc): 92 mg/dL (calc)
Non-HDL Cholesterol (Calc): 118 mg/dL (calc) (ref <130)
Total CHOL/HDL Ratio: 3.7 (calc) (ref <5.0)
Triglycerides: 165 mg/dL — ABNORMAL HIGH (ref <150)

## 2019-10-03 LAB — HEPATITIS C ANTIBODY
Hepatitis C Ab: NONREACTIVE
SIGNAL TO CUT-OFF: 0 (ref <1.00)

## 2019-10-03 LAB — IRON,TIBC AND FERRITIN PANEL
%SAT: 23 % (calc) (ref 16–45)
Ferritin: 74 ng/mL (ref 16–288)
Iron: 67 ug/dL (ref 45–160)
TIBC: 287 mcg/dL (calc) (ref 250–450)

## 2019-10-04 NOTE — Progress Notes (Addendum)
COVID Vaccine Completed: Date COVID Vaccine completed: COVID vaccine manufacturer: [place]   PCP - Dr. Unk Pinto, MD Cardiologist - Adrian Prows, MD last OV 09-28-19  Cardiac Clearance in Epic note dated 09-28-19  Chest x-ray -  EKG - 08-17-19 Stress Test - 08-30-19 ECHO - 09-13-19.  Cardiac Cath -   Sleep Study -  CPAP -   Fasting Blood Sugar -  Checks Blood Sugar _____ times a day  Blood Thinner Instructions: Eliquis 5 mg.  Per note Dr. Einar Gip 09-28-19 patient has not been taking for several months.  Recommend to resume after surgery Aspirin Instructions: ASA 81 Last Dose:  Anesthesia review:  Hx of Afib, HTN, CKD  Patient denies shortness of breath, fever, cough and chest pain at PAT appointment   Patient verbalized understanding of instructions that were given to them at the PAT appointment. Patient was also instructed that they will need to review over the PAT instructions again at home before surgery.

## 2019-10-04 NOTE — Patient Instructions (Signed)
DUE TO COVID-19 ONLY ONE VISITOR ARE ALLOWED TO COME WITH YOU AND STAY IN THE WAITING ROOM ONLY DURING PRE OP AND PROCEDURE. THEN TWO VISITORS MAY VISIT WITH YOU IN YOUR PRIVATE ROOM DURING VISITING HOURS ONLY!! (10AM-8PM)   COVID SWAB TESTING MUST BE COMPLETED ON:    10-13-19 @ [address], [place] -[facility] enter pre surgical testing line (Must self quarantine after testing. Follow instructions on handout.)               Your procedure is scheduled on: 10-17-19   Report to [facility] Main  Entrance    Report to admitting at 7:30 AM   Call this number if you have problems the morning of surgery [phone]   Do not eat food  :After Midnight.   May have liquids until 7 AM day of surgery    CLEAR LIQUID DIET  Foods Allowed                                                                     Foods Excluded  Water, Black Coffee and tea, regular and decaf           liquids that you cannot  Plain Jell-O in any flavor  (No red)                                  see through such as: Fruit ices (not with fruit pulp)                                      milk, soups, orange juice  Iced Popsicles (No red)                                    All solid food                                   Apple juices Sports drinks like Gatorade (No red) Lightly seasoned clear broth or consume(fat free) Sugar, honey syrup   Sample Menu Breakfast                                Lunch                                     Supper Cranberry juice                    Beef broth                            Chicken broth Jell-O  Grape juice                           Apple juice Coffee or tea                        Jell-O                                      Popsicle                                                Coffee or tea                        Coffee or tea    Complete one Ensure drink the morning of surgery at  7 AM the day of  surgery.    Oral Hygiene is also important to reduce your risk of infection.                                     Remember - BRUSH YOUR TEETH THE MORNING OF SURGERY WITH YOUR REGULAR TOOTHPASTE   Do NOT smoke after Midnight   Take these medicines the morning of surgery with A SIP OF WATER:  Metoprolol (Lopressor), Pantoprazole (Protonix), Diazepam (Valium)                              You may not have any metal on your body including hair pins, jewelry, and body piercings              Do not wear make-up, lotions, powders, perfumes, or deodorant              Do not wear nail polish.  Do not shave  48 hours prior to surgery.    Do not bring valuables to the hospital. [place].   Contacts, dentures or bridgework may not be worn into surgery.   Bring small overnight bag day of surgery.      Special Instructions: Bring a copy of your healthcare power of attorney and living will documents the day of surgery if you haven't scanned them in  before.              Please read over the following fact sheets you were given: IF YOU HAVE QUESTIONS ABOUT YOUR PRE OP INSTRUCTIONS PLEASE CALL [phone]   [place] - Preparing for Surgery Before surgery, you can play an important role.  Because skin is not sterile, your skin needs to be as free of germs as possible.  You can reduce the number of germs on your skin by washing with CHG (chlorahexidine gluconate) soap before surgery.  CHG is an antiseptic cleaner which kills germs and bonds with the skin to continue killing germs even after washing. Please DO NOT use if you have an allergy to CHG or antibacterial soaps.  If your skin becomes reddened/irritated stop using the CHG and inform your nurse when you  arrive at Short Stay. Do not shave (including legs and underarms) for at least 48 hours prior to the first CHG shower.  You may shave your face/neck.  Please follow these instructions  carefully:  1.  Shower with CHG Soap the night before surgery and the  morning of surgery.  2.  If you choose to wash your hair, wash your hair first as usual with your normal  shampoo.  3.  After you shampoo, rinse your hair and body thoroughly to remove the shampoo.                             4.  Use CHG as you would any other liquid soap.  You can apply chg directly to the skin and wash.  Gently with a scrungie or clean washcloth.  5.  Apply the CHG Soap to your body ONLY FROM THE NECK DOWN.   Do not use on face/ open                       Wound or open sores. Avoid contact with eyes, ears mouth and genitals (private parts).                   Wash face,  Genitals (private parts) with your normal soap.             6.  Wash thoroughly, paying special attention to the area where your surgery  will be performed.  7.  Thoroughly rinse your body with warm water from the neck down.  8.  DO NOT shower/wash with your normal soap after using and rinsing off the CHG Soap.             9.  Pat yourself dry with a clean towel.            10.  Wear clean pajamas.            11.  Place clean sheets on your bed the night of your first shower and do not  sleep with pets. Day of Surgery : Do not apply any lotions/deodorants the morning of surgery.  Please wear clean clothes to the hospital/surgery center.  FAILURE TO FOLLOW THESE INSTRUCTIONS MAY RESULT IN THE CANCELLATION OF YOUR SURGERY  PATIENT SIGNATURE_________________________________  NURSE SIGNATURE__________________________________  ________________________________________________________________________   Lori [name]  An incentive spirometer is a tool that can help keep your lungs clear and active. This tool measures how well you are filling your lungs with each breath. Taking long deep breaths may help reverse or decrease the chance of developing breathing (pulmonary) problems (especially infection) following:  A long period of time  when you are unable to move or be active. BEFORE THE PROCEDURE   If the spirometer includes an indicator to show your best effort, your nurse or respiratory therapist will set it to a desired goal.  If possible, sit up straight or lean slightly forward. Try not to slouch.  Hold the incentive spirometer in an upright position. INSTRUCTIONS FOR USE  1. Sit on the edge of your bed if possible, or sit up as far as you can in bed or on a chair. 2. Hold the incentive spirometer in an upright position. 3. Breathe out normally. 4. Place the mouthpiece in your mouth and seal your lips tightly around it. 5. Breathe in slowly and as deeply as possible, raising the piston or the ball toward the top  of the column. 6. Hold your breath for 3-5 seconds or for as long as possible. Allow the piston or ball to fall to the bottom of the column. 7. Remove the mouthpiece from your mouth and breathe out normally. 8. Rest for a few seconds and repeat Steps 1 through 7 at least 10 times every 1-2 hours when you are awake. Take your time and take a few normal breaths between deep breaths. 9. The spirometer may include an indicator to show your best effort. Use the indicator as a goal to work toward during each repetition. 10. After each set of 10 deep breaths, practice coughing to be sure your lungs are clear. If you have an incision (the cut made at the time of surgery), support your incision when coughing by placing a pillow or rolled up towels firmly against it. Once you are able to get out of bed, walk around indoors and cough well. You may stop using the incentive spirometer when instructed by your caregiver.  RISKS AND COMPLICATIONS  Take your time so you do not get dizzy or light-headed.  If you are in pain, you may need to take or ask for pain medication before doing incentive spirometry. It is harder to take a deep breath if you are having pain. AFTER USE  Rest and breathe slowly and easily.  It can be  helpful to keep track of a log of your progress. Your caregiver can provide you with a simple table to help with this. If you are using the spirometer at home, follow these instructions: [place] IF:   You are having difficultly using the spirometer.  You have trouble using the spirometer as often as instructed.  Your pain medication is not giving enough relief while using the spirometer.  You develop fever of 100.5 F (38.1 C) or higher. SEEK IMMEDIATE MEDICAL CARE IF:   You cough up bloody sputum that had not been present before.  You develop fever of 102 F (38.9 C) or greater.  You develop worsening pain at or near the incision site. MAKE SURE YOU:   Understand these instructions.  Will watch your condition.  Will get help right away if you are not doing well or get worse. Document Released: 07/13/2006 Document Revised: 05/25/2011 Document Reviewed: 09/13/2006 Exit[employer] Patient Information 2014 [employer], [place].   ________________________________________________________________________  WHAT IS A BLOOD TRANSFUSION? Blood Transfusion Information  A transfusion is the replacement of blood or some of its parts. Blood is made up of multiple cells which provide different functions.  Red blood cells carry oxygen and are used for blood loss replacement.  White blood cells fight against infection.  Platelets control bleeding.  Plasma helps clot blood.  Other blood products are available for specialized needs, such as hemophilia or other clotting disorders. BEFORE THE TRANSFUSION  Who gives blood for transfusions?   Healthy volunteers who are fully evaluated to make sure their blood is safe. This is blood bank blood. Transfusion therapy is the safest it has ever been in the practice of medicine. Before blood is taken from a donor, a complete history is taken to make sure that person has no history of diseases nor engages in risky social behavior (examples are  intravenous drug use or sexual activity with multiple partners). The donor's travel history is screened to minimize risk of transmitting infections, such as malaria. The donated blood is tested for signs of infectious diseases, such as HIV and hepatitis. The blood is then tested to  be sure it is compatible with you in order to minimize the chance of a transfusion reaction. If you or a relative donates blood, this is often done in anticipation of surgery and is not appropriate for emergency situations. It takes many days to process the donated blood. RISKS AND COMPLICATIONS Although transfusion therapy is very safe and saves many lives, the main dangers of transfusion include:   Getting an infectious disease.  Developing a transfusion reaction. This is an allergic reaction to something in the blood you were given. Every precaution is taken to prevent this. The decision to have a blood transfusion has been considered carefully by your caregiver before blood is given. Blood is not given unless the benefits outweigh the risks. AFTER THE TRANSFUSION  Right after receiving a blood transfusion, you will usually feel much better and more energetic. This is especially true if your red blood cells have gotten low (anemic). The transfusion raises the level of the red blood cells which carry oxygen, and this usually causes an energy increase.  The nurse administering the transfusion will monitor you carefully for complications. HOME CARE INSTRUCTIONS  No special instructions are needed after a transfusion. You may find your energy is better. Speak with your caregiver about any limitations on activity for underlying diseases you may have. SEEK MEDICAL CARE IF:   Your condition is not improving after your transfusion.  You develop redness or irritation at the intravenous (IV) site. SEEK IMMEDIATE MEDICAL CARE IF:  Any of the following symptoms occur over the next 12 hours:  Shaking chills.  You have a  temperature by mouth above 102 F (38.9 C), not controlled by medicine.  Chest, back, or muscle pain.  People around you feel you are not acting correctly or are confused.  Shortness of breath or difficulty breathing.  Dizziness and fainting.  You get a rash or develop hives.  You have a decrease in urine output.  Your urine turns a dark color or changes to pink, red, or brown. Any of the following symptoms occur over the next 10 days:  You have a temperature by mouth above 102 F (38.9 C), not controlled by medicine.  Shortness of breath.  Weakness after normal activity.  The white part of the eye turns yellow (jaundice).  You have a decrease in the amount of urine or are urinating less often.  Your urine turns a dark color or changes to pink, red, or brown. Document Released: 02/28/2000 Document Revised: 05/25/2011 Document Reviewed: 10/17/2007 [facility] Patient Information 2014 [place], [place].  _______________________________________________________________________

## 2019-10-06 ENCOUNTER — Encounter (HOSPITAL_COMMUNITY): Payer: Self-pay

## 2019-10-06 ENCOUNTER — Other Ambulatory Visit: Payer: Self-pay | Admitting: Internal Medicine

## 2019-10-06 ENCOUNTER — Encounter (HOSPITAL_COMMUNITY)
Admission: RE | Admit: 2019-10-06 | Discharge: 2019-10-06 | Disposition: A | Discharge status: Home / Self Care | Payer: PPO | Source: Ambulatory Visit | Attending: Orthopedic Surgery | Admitting: Orthopedic Surgery

## 2019-10-06 ENCOUNTER — Other Ambulatory Visit: Payer: Self-pay

## 2019-10-06 ENCOUNTER — Other Ambulatory Visit: Payer: Self-pay | Admitting: Adult Health

## 2019-10-06 DIAGNOSIS — D649 Anemia, unspecified: Secondary | ICD-10-CM

## 2019-10-06 DIAGNOSIS — Z01812 Encounter for preprocedural laboratory examination: Secondary | ICD-10-CM | POA: Diagnosis not present

## 2019-10-06 HISTORY — DX: Anemia, unspecified: D64.9

## 2019-10-06 HISTORY — DX: Other pulmonary embolism without acute cor pulmonale: I26.99

## 2019-10-06 HISTORY — DX: Cardiac arrhythmia, unspecified: I49.9

## 2019-10-06 HISTORY — DX: Dyspnea, unspecified: R06.00

## 2019-10-06 HISTORY — DX: Unspecified hearing loss, unspecified ear: H91.90

## 2019-10-06 HISTORY — DX: Personal history of peptic ulcer disease: Z87.11

## 2019-10-06 HISTORY — DX: Prediabetes: R73.03

## 2019-10-06 LAB — HEMOGLOBIN A1C
Hgb A1c MFr Bld: 5.5 % (ref 4.8–5.6)
Mean Plasma Glucose: 111.15 mg/dL

## 2019-10-06 LAB — TYPE AND SCREEN
ABO/RH(D): A POS
Antibody Screen: NEGATIVE

## 2019-10-06 LAB — PROTIME-INR
INR: 1 (ref 0.8–1.2)
Prothrombin Time: 12.6 seconds (ref 11.4–15.2)

## 2019-10-06 LAB — SURGICAL PCR SCREEN
MRSA, PCR: NEGATIVE
Staphylococcus aureus: NEGATIVE

## 2019-10-06 LAB — APTT: aPTT: 28 seconds (ref 24–36)

## 2019-10-06 NOTE — Progress Notes (Signed)
CBC/DIFf/CMP done 09/28/19- results in epic.   LOV with PCP 09/28/19 _ DR Melford Aase  Patient reports has been off Aspirin for a week and off Eliquis for over a month.  Patient states Eliquis to be restarted after surgery per Dr Einar Gip.

## 2019-10-06 NOTE — Progress Notes (Signed)
DUE TO COVID-19 ONLY ONE VISITOR IS ALLOWED TO COME WITH YOU AND STAY IN THE WAITING ROOM ONLY DURING PRE OP AND PROCEDURE DAY OF SURGERY. THE 1 VISITOR MAY VISIT WITH YOU AFTER SURGERY IN YOUR PRIVATE ROOM DURING VISITING HOURS ONLY!  YOU NEED TO HAVE A COVID 19 TEST ON_______ @_______ , THIS TEST MUST BE DONE BEFORE SURGERY, COME  [place], [place] , [address].  ([place]) ONCE YOUR COVID TEST IS COMPLETED, PLEASE BEGIN THE QUARANTINE INSTRUCTIONS AS OUTLINED IN YOUR HANDOUT.                Lori [name]  10/06/2019   Your procedure is scheduled on:    Report to [facility] Main  Entrance   Report to admitting at AM     Call this number if you have problems the morning of surgery [phone]    Remember: Do not eat food    :After Midnight. BRUSH YOUR TEETH MORNING OF SURGERY AND RINSE YOUR MOUTH OUT, NO CHEWING GUM CANDY OR MINTS.     Take these medicines the morning of surgery with A SIP OF WATER:  DO NOT TAKE ANY DIABETIC MEDICATIONS DAY OF YOUR SURGERY           Metoprolol ( Lopressor) , Pantoprazole ( Protonix) , diazepam ( Valium) if needed                     You may not have any metal on your body including hair pins and              piercings  Do not wear jewelry, make-up, lotions, powders or perfumes, deodorant             Do not wear nail polish on your fingernails.  Do not shave  48 hours prior to surgery.     Do not bring valuables to the hospital. [place].  Contacts, dentures or bridgework may not be worn into surgery.  Leave suitcase in the car. After surgery it may be brought to your room.                      Please read over the following fact sheets you were given: _____________________________________________________________________             NO SOLID FOOD AFTER MIDNIGHT THE NIGHT PRIOR TO SURGERY. NOTHING BY MOUTH EXCEPT CLEAR LIQUIDS UNTIL    0700am  . PLEASE  FINISH G2 DRINK PER SURGEON ORDER  WHICH NEEDS TO BE COMPLETED AT0700am  .   CLEAR LIQUID DIET   Foods Allowed                                                                      Coffee and tea, regular and decaf  Fruit ices (not with fruit pulp)                                     Iced Popsicles                                    Carbonated beverages, regular and diet                                    Cranberry, grape and apple juices Sports drinks like Gatorade Lightly seasoned clear broth or consume(fat free) Sugar, honey syrup    _____________________________________________________________________  [facility] - Preparing for Surgery Before surgery, you can play an important role.  Because skin is not sterile, your skin needs to be as free of germs as possible.  You can reduce the number of germs on your skin by washing with CHG (chlorahexidine gluconate) soap before surgery.  CHG is an antiseptic cleaner which kills germs and bonds with the skin to continue killing germs even after washing. Please DO NOT use if you have an allergy to CHG or antibacterial soaps.  If your skin becomes reddened/irritated stop using the CHG and inform your nurse when you arrive at Short Stay. Do not shave (including legs and underarms) for at least 48 hours prior to the first CHG shower.  You may shave your face/neck. Please follow these instructions carefully:  1.  Shower with CHG Soap the night before surgery and the  morning of Surgery.  2.  If you choose to wash your hair, wash your hair first as usual with your  normal  shampoo.  3.  After you shampoo, rinse your hair and body thoroughly to remove the  shampoo.                           4.  Use CHG as you would any other liquid soap.  You can apply chg directly  to the skin and wash                       Gently with a scrungie or clean washcloth.  5.  Apply the CHG Soap to your  body ONLY FROM THE NECK DOWN.   Do not use on face/ open                           Wound or open sores. Avoid contact with eyes, ears mouth and genitals (private parts).                       Wash face,  Genitals (private parts) with your normal soap.             6.  Wash thoroughly, paying special attention to the area where your surgery  will be performed.  7.  Thoroughly rinse your body with warm water from the neck down.  8.  DO NOT shower/wash with your normal soap after using and rinsing off  the CHG Soap.                9.  Pat yourself dry with a clean towel.  10.  Wear clean pajamas.            11.  Place clean sheets on your bed the night of your first shower and do not  sleep with pets. Day of Surgery : Do not apply any lotions/deodorants the morning of surgery.  Please wear clean clothes to the hospital/surgery center.  FAILURE TO FOLLOW THESE INSTRUCTIONS MAY RESULT IN THE CANCELLATION OF YOUR SURGERY PATIENT SIGNATURE_________________________________  NURSE SIGNATURE__________________________________  ________________________________________________________________________   Lori [name]  An incentive spirometer is a tool that can help keep your lungs clear and active. This tool measures how well you are filling your lungs with each breath. Taking long deep breaths may help reverse or decrease the chance of developing breathing (pulmonary) problems (especially infection) following:  A long period of time when you are unable to move or be active. BEFORE THE PROCEDURE   If the spirometer includes an indicator to show your best effort, your nurse or respiratory therapist will set it to a desired goal.  If possible, sit up straight or lean slightly forward. Try not to slouch.  Hold the incentive spirometer in an upright position. INSTRUCTIONS FOR USE  1. Sit on the edge of your bed if possible, or sit up as far as you can in bed or on a chair. 2. Hold the  incentive spirometer in an upright position. 3. Breathe out normally. 4. Place the mouthpiece in your mouth and seal your lips tightly around it. 5. Breathe in slowly and as deeply as possible, raising the piston or the ball toward the top of the column. 6. Hold your breath for 3-5 seconds or for as long as possible. Allow the piston or ball to fall to the bottom of the column. 7. Remove the mouthpiece from your mouth and breathe out normally. 8. Rest for a few seconds and repeat Steps 1 through 7 at least 10 times every 1-2 hours when you are awake. Take your time and take a few normal breaths between deep breaths. 9. The spirometer may include an indicator to show your best effort. Use the indicator as a goal to work toward during each repetition. 10. After each set of 10 deep breaths, practice coughing to be sure your lungs are clear. If you have an incision (the cut made at the time of surgery), support your incision when coughing by placing a pillow or rolled up towels firmly against it. Once you are able to get out of bed, walk around indoors and cough well. You may stop using the incentive spirometer when instructed by your caregiver.  RISKS AND COMPLICATIONS  Take your time so you do not get dizzy or light-headed.  If you are in pain, you may need to take or ask for pain medication before doing incentive spirometry. It is harder to take a deep breath if you are having pain. AFTER USE  Rest and breathe slowly and easily.  It can be helpful to keep track of a log of your progress. Your caregiver can provide you with a simple table to help with this. If you are using the spirometer at home, follow these instructions: [place] IF:   You are having difficultly using the spirometer.  You have trouble using the spirometer as often as instructed.  Your pain medication is not giving enough relief while using the spirometer.  You develop fever of 100.5 F (38.1 C) or  higher. SEEK IMMEDIATE MEDICAL CARE IF:   You cough up bloody  sputum that had not been present before.  You develop fever of 102 F (38.9 C) or greater.  You develop worsening pain at or near the incision site. MAKE SURE YOU:   Understand these instructions.  Will watch your condition.  Will get help right away if you are not doing well or get worse. Document Released: 07/13/2006 Document Revised: 05/25/2011 Document Reviewed: 09/13/2006 ExitCare Patient Information 2014 ExitCare, [place].   ________________________________________________________________________  WHAT IS A BLOOD TRANSFUSION? Blood Transfusion Information  A transfusion is the replacement of blood or some of its parts. Blood is made up of multiple cells which provide different functions.  Red blood cells carry oxygen and are used for blood loss replacement.  White blood cells fight against infection.  Platelets control bleeding.  Plasma helps clot blood.  Other blood products are available for specialized needs, such as hemophilia or other clotting disorders. BEFORE THE TRANSFUSION  Who gives blood for transfusions?   Healthy volunteers who are fully evaluated to make sure their blood is safe. This is blood bank blood. Transfusion therapy is the safest it has ever been in the practice of medicine. Before blood is taken from a donor, a complete history is taken to make sure that person has no history of diseases nor engages in risky social behavior (examples are intravenous drug use or sexual activity with multiple partners). The donor's travel history is screened to minimize risk of transmitting infections, such as malaria. The donated blood is tested for signs of infectious diseases, such as HIV and hepatitis. The blood is then tested to be sure it is compatible with you in order to minimize the chance of a transfusion reaction. If you or a relative donates blood, this is often done in anticipation of surgery  and is not appropriate for emergency situations. It takes many days to process the donated blood. RISKS AND COMPLICATIONS Although transfusion therapy is very safe and saves many lives, the main dangers of transfusion include:   Getting an infectious disease.  Developing a transfusion reaction. This is an allergic reaction to something in the blood you were given. Every precaution is taken to prevent this. The decision to have a blood transfusion has been considered carefully by your caregiver before blood is given. Blood is not given unless the benefits outweigh the risks. AFTER THE TRANSFUSION  Right after receiving a blood transfusion, you will usually feel much better and more energetic. This is especially true if your red blood cells have gotten low (anemic). The transfusion raises the level of the red blood cells which carry oxygen, and this usually causes an energy increase.  The nurse administering the transfusion will monitor you carefully for complications. HOME CARE INSTRUCTIONS  No special instructions are needed after a transfusion. You may find your energy is better. Speak with your caregiver about any limitations on activity for underlying diseases you may have. SEEK MEDICAL CARE IF:   Your condition is not improving after your transfusion.  You develop redness or irritation at the intravenous (IV) site. SEEK IMMEDIATE MEDICAL CARE IF:  Any of the following symptoms occur over the next 12 hours:  Shaking chills.  You have a temperature by mouth above 102 F (38.9 C), not controlled by medicine.  Chest, back, or muscle pain.  People around you feel you are not acting correctly or are confused.  Shortness of breath or difficulty breathing.  Dizziness and fainting.  You get a rash or develop hives.  You have a decrease  in urine output.  Your urine turns a dark color or changes to pink, red, or brown. Any of the following symptoms occur over the next 10  days:  You have a temperature by mouth above 102 F (38.9 C), not controlled by medicine.  Shortness of breath.  Weakness after normal activity.  The white part of the eye turns yellow (jaundice).  You have a decrease in the amount of urine or are urinating less often.  Your urine turns a dark color or changes to pink, red, or brown. Document Released: 02/28/2000 Document Revised: 05/25/2011 Document Reviewed: 10/17/2007 [facility] Patient Information 2014 [place], [place].  _______________________________________________________________________

## 2019-10-07 LAB — URINE CULTURE: Culture: <10000 — AB

## 2019-10-09 NOTE — Progress Notes (Signed)
UA culture sent to Dr. Percell Miller for review.

## 2019-10-11 NOTE — Anesthesia Preprocedure Evaluation (Addendum)
Anesthesia Evaluation  Patient identified by MRN, date of birth, ID band Patient awake    Reviewed: Allergy & Precautions, NPO status , Patient's Chart, lab work & pertinent test results, reviewed documented beta blocker date and time   Airway Mallampati: I       Dental no notable dental hx.    Pulmonary former smoker,    Pulmonary exam normal        Cardiovascular hypertension, Pt. on home beta blockers + dysrhythmias Atrial Fibrillation  Rhythm:Irregular Rate:Normal     Neuro/Psych negative neurological ROS  negative psych ROS   GI/Hepatic   Endo/Other  negative endocrine ROS  Renal/GU Renal InsufficiencyRenal diseaseCKD III  negative genitourinary   Musculoskeletal  (+) Arthritis , Osteoarthritis,    Abdominal Normal abdominal exam  (+)   Peds  Hematology  (+) Blood dyscrasia, anemia ,   Anesthesia Other Findings Echocardiogram 09/13/2019:  Normal LV systolic function with EF 69%. Left ventricle cavity is normal  in size. Moderate concentric hypertrophy of the left ventricle. Normal  global wall motion. Doppler evidence of grade II (pseudonormal) diastolic  dysfunction, elevated LAP. Calculated EF 69%.  Left atrial cavity is severely dilated by volume.  Right atrial cavity is moderately dilated.  Right ventricle cavity is mildly dilated. Mild concentric hypertrophy of  the right ventricle. Normal right ventricular function.  Structurally normal tricuspid valve. Moderate to severe tricuspid  regurgitation. Moderate pulmonary hypertension. RVSP measures 59 mmHg.  Insignificant pericardial effusion with clear fluids.  IVC is dilated with blunted respiratory response. CVP estimated at 15 mm  Hg.  No significant change from 02/04/2018 report.  Imaging Info  PCV ECHOCARDIOGRAM COMPLETE (Order #[id number]) on 09/13/19 Study History    Reproductive/Obstetrics                           Anesthesia Physical Anesthesia Plan  ASA: II  Anesthesia Plan: Spinal   Post-op Pain Management:    Induction:   PONV Risk Score and Plan: Ondansetron  Airway Management Planned: Natural Airway and Simple Face Mask  Additional Equipment: None  Intra-op Plan:   Post-operative Plan:   Informed Consent: I have reviewed the patients History and Physical, chart, labs and discussed the procedure including the risks, benefits and alternatives for the proposed anesthesia with the patient or authorized representative who has indicated his/her understanding and acceptance.       Plan Discussed with: CRNA  Anesthesia Plan Comments: (See PAT note 10/06/2019, Konrad Felix, PA-C)      Anesthesia Quick Evaluation

## 2019-10-11 NOTE — Progress Notes (Signed)
Anesthesia Chart Review   Case: [id number] Date/Time: 10/17/19 0951   Procedure: TOTAL HIP ARTHROPLASTY ANTERIOR APPROACH (Right Hip)   Anesthesia type: Choice   Pre-op diagnosis: OA RIGHT HIP   Location: [place] [facility] / [facility]   Surgeons: Renette Butters, MD      DISCUSSION:79 y.o. former smoker (quit 08/07/90) with h/o HTN, HLD, atrial fibrillation (Eliquis), cerebral aneurysm s/p coiling in 2001, PE 01/2018, pre-diabetes, Moderate pulmonary hypertension. RVSP measures 59 mmHg, OA right hip scheduled for above procedure 10/17/2019 with Dr. Edmonia Lynch.   Pt last seen by cardiology 09/28/2019.  Per OV note, "She has been set up for right hip arthroplasty on 10/17/2019.  She does have stage III chronic kidney disease that has remained stable.  I advised her that she should be on anticoagulation in view of high cardioembolic risk however as she has not been on Eliquis for several months, advised her to restart this after her surgery.  Other option is to restart now and to hold it 2 days to 3 days before her surgery. Her blood pressure is slightly elevated today but overall much improved from last time.  She is presently on metoprolol 25 mg p.o. twice daily, continue the same. I reviewed her echocardiogram, she does have moderate pulmonary hypertension, suspect this is related to cor pulmonale from prior tobacco use.  Today there is no clinical evidence of right-sided heart failure.  I would like to see her back in 1 year for follow-up of atrial fibrillation and also pulmonary hypertension and cor pulmonale and if she remains stable I will see her back on a as needed basis. I will repeat Echo in a year"  Anticipate pt can proceed with planned procedure barring acute status change.   VS: BP (!) 143/66   Pulse 53   Temp 36.7 C (Oral)   Resp 16   Ht 5\' 4"  (1.626 m)   SpO2 99%   BMI 27.15 kg/m   PROVIDERS: Unk Pinto, MD is PCP   Adrian Prows, MD is Cardiologist  LABS: Labs reviewed:  Acceptable for surgery. (all labs ordered are listed, but only abnormal results are displayed)  Labs Reviewed  URINE CULTURE - Abnormal; Notable for the following components:      Result Value   Culture   (*)    Value: <10,000 COLONIES/mL INSIGNIFICANT GROWTH Performed at [facility], [address], [place], [place] [address]    All other components within normal limits  SURGICAL PCR SCREEN  HEMOGLOBIN A1C  APTT  PROTIME-INR  TYPE AND SCREEN     IMAGES:   EKG: EKG 08/17/2019: Normal sinus rhythm at rate of 82 bpm, left atrial enlargement, normal axis.  No evidence of ischemia.     CV: Echocardiogram 09/13/2019:  Normal LV systolic function with EF 69%. Left ventricle cavity is normal  in size. Moderate concentric hypertrophy of the left ventricle. Normal  global wall motion. Doppler evidence of grade II (pseudonormal) diastolic  dysfunction, elevated LAP. Calculated EF 69%.  Left atrial cavity is severely dilated by volume.  Right atrial cavity is moderately dilated.  Right ventricle cavity is mildly dilated. Mild concentric hypertrophy of  the right ventricle. Normal right ventricular function.  Structurally normal tricuspid valve. Moderate to severe tricuspid  regurgitation. Moderate pulmonary hypertension. RVSP measures 59 mmHg.  Insignificant pericardial effusion with clear fluids.  IVC is dilated with blunted respiratory response. CVP estimated at 15 mm  Hg.  No significant change from 02/04/2018  report.  Past Medical History:  Diagnosis Date  . Anemia   . Arthritis   . Benign neoplasm of sigmoid colon   . Deafness    left ear per pt   . Dyspnea    pt reports with exertion due to hip and knees   . Dysrhythmia    afib   . History of bleeding ulcers    2019 received 3-4 units of blood   . Hyperlipidemia   . Hypertension   . Pericardial effusion 02/04/2018  . Pre-diabetes    per Dr Einar Gip note of 09/28/19 pt denies at preop visit on 10/06/19   .  Prediabetes   . Primary localized osteoarthritis of right hip 09/26/2019  . Pulmonary embolism ([place])    hx of 2019   . Vertigo   . Vitamin D deficiency     Past Surgical History:  Procedure Laterality Date  . BIOPSY  02/05/2018   Procedure: BIOPSY;  Surgeon: Ladene Artist, MD;  Location: [facility];  Service: Endoscopy;;  . CATARACT EXTRACTION W/ INTRAOCULAR LENS  IMPLANT, BILATERAL  2009  . CEREBRAL ANEURYSM REPAIR    . COLONOSCOPY N/A 02/05/2018   Procedure: COLONOSCOPY;  Surgeon: Ladene Artist, MD;  Location: [facility];  Service: Endoscopy;  Laterality: N/A;  . ESOPHAGOGASTRODUODENOSCOPY N/A 02/05/2018   Procedure: ESOPHAGOGASTRODUODENOSCOPY (EGD);  Surgeon: Ladene Artist, MD;  Location: [facility];  Service: Endoscopy;  Laterality: N/A;  . FOOT SURGERY  1993  . POLYPECTOMY  02/05/2018   Procedure: POLYPECTOMY;  Surgeon: Ladene Artist, MD;  Location: [facility];  Service: Endoscopy;;  . TUBAL LIGATION      MEDICATIONS: . acetaminophen (TYLENOL) 500 MG tablet  . apixaban (ELIQUIS) 5 MG TABS tablet  . aspirin EC 81 MG tablet  . Cholecalciferol 25 MCG (1000 UT) capsule  . diazepam (VALIUM) 5 MG tablet  . ferrous sulfate 325 (65 FE) MG EC tablet  . metoprolol tartrate (LOPRESSOR) 25 MG tablet  . pantoprazole (PROTONIX) 40 MG tablet   No current facility-administered medications for this encounter.     Konrad Felix, PA-C [facility] Pre-Surgical Testing [phone]

## 2019-10-13 ENCOUNTER — Other Ambulatory Visit (HOSPITAL_COMMUNITY)
Admission: RE | Admit: 2019-10-13 | Discharge: 2019-10-13 | Disposition: A | Discharge status: Home / Self Care | Payer: PPO | Source: Ambulatory Visit | Attending: Orthopedic Surgery | Admitting: Orthopedic Surgery

## 2019-10-13 DIAGNOSIS — Z01812 Encounter for preprocedural laboratory examination: Secondary | ICD-10-CM | POA: Diagnosis not present

## 2019-10-13 DIAGNOSIS — Z20822 Contact with and (suspected) exposure to covid-19: Secondary | ICD-10-CM | POA: Insufficient documentation

## 2019-10-13 LAB — SARS CORONAVIRUS 2 (TAT 6-24 HRS): SARS Coronavirus 2: NEGATIVE

## 2019-10-17 ENCOUNTER — Ambulatory Visit (HOSPITAL_COMMUNITY): Payer: PPO | Admitting: Anesthesiology

## 2019-10-17 ENCOUNTER — Encounter (HOSPITAL_COMMUNITY)
Admission: RE | Disposition: A | Discharge status: Home / Self Care | Payer: Self-pay | Source: Home / Self Care | Attending: Orthopedic Surgery

## 2019-10-17 ENCOUNTER — Observation Stay (HOSPITAL_COMMUNITY)
Admission: RE | Admit: 2019-10-17 | Discharge: 2019-10-18 | Disposition: A | Discharge status: Home / Self Care | Payer: PPO | Attending: Orthopedic Surgery | Admitting: Orthopedic Surgery

## 2019-10-17 ENCOUNTER — Observation Stay (HOSPITAL_COMMUNITY): Payer: PPO

## 2019-10-17 ENCOUNTER — Other Ambulatory Visit: Payer: Self-pay

## 2019-10-17 ENCOUNTER — Ambulatory Visit (HOSPITAL_COMMUNITY): Payer: PPO | Admitting: Physician Assistant

## 2019-10-17 ENCOUNTER — Encounter (HOSPITAL_COMMUNITY): Payer: Self-pay | Admitting: Orthopedic Surgery

## 2019-10-17 DIAGNOSIS — Z7901 Long term (current) use of anticoagulants: Secondary | ICD-10-CM | POA: Insufficient documentation

## 2019-10-17 DIAGNOSIS — R7303 Prediabetes: Secondary | ICD-10-CM | POA: Diagnosis not present

## 2019-10-17 DIAGNOSIS — M48061 Spinal stenosis, lumbar region without neurogenic claudication: Secondary | ICD-10-CM | POA: Diagnosis present

## 2019-10-17 DIAGNOSIS — E782 Mixed hyperlipidemia: Secondary | ICD-10-CM | POA: Diagnosis present

## 2019-10-17 DIAGNOSIS — Z471 Aftercare following joint replacement surgery: Secondary | ICD-10-CM | POA: Diagnosis not present

## 2019-10-17 DIAGNOSIS — J45909 Unspecified asthma, uncomplicated: Secondary | ICD-10-CM | POA: Insufficient documentation

## 2019-10-17 DIAGNOSIS — E66811 Obesity, class 1: Secondary | ICD-10-CM | POA: Diagnosis present

## 2019-10-17 DIAGNOSIS — I4891 Unspecified atrial fibrillation: Secondary | ICD-10-CM | POA: Diagnosis not present

## 2019-10-17 DIAGNOSIS — M1611 Unilateral primary osteoarthritis, right hip: Secondary | ICD-10-CM | POA: Diagnosis not present

## 2019-10-17 DIAGNOSIS — K2100 Gastro-esophageal reflux disease with esophagitis, without bleeding: Secondary | ICD-10-CM | POA: Diagnosis present

## 2019-10-17 DIAGNOSIS — Z87891 Personal history of nicotine dependence: Secondary | ICD-10-CM | POA: Diagnosis not present

## 2019-10-17 DIAGNOSIS — I10 Essential (primary) hypertension: Secondary | ICD-10-CM | POA: Diagnosis present

## 2019-10-17 DIAGNOSIS — Z7982 Long term (current) use of aspirin: Secondary | ICD-10-CM | POA: Diagnosis not present

## 2019-10-17 DIAGNOSIS — Z419 Encounter for procedure for purposes other than remedying health state, unspecified: Secondary | ICD-10-CM

## 2019-10-17 DIAGNOSIS — Z79899 Other long term (current) drug therapy: Secondary | ICD-10-CM | POA: Insufficient documentation

## 2019-10-17 DIAGNOSIS — Z86010 Personal history of colonic polyps: Secondary | ICD-10-CM | POA: Insufficient documentation

## 2019-10-17 DIAGNOSIS — Z86711 Personal history of pulmonary embolism: Secondary | ICD-10-CM | POA: Diagnosis present

## 2019-10-17 DIAGNOSIS — M25551 Pain in right hip: Secondary | ICD-10-CM | POA: Diagnosis present

## 2019-10-17 DIAGNOSIS — E669 Obesity, unspecified: Secondary | ICD-10-CM | POA: Diagnosis present

## 2019-10-17 DIAGNOSIS — D500 Iron deficiency anemia secondary to blood loss (chronic): Secondary | ICD-10-CM

## 2019-10-17 DIAGNOSIS — Z96641 Presence of right artificial hip joint: Secondary | ICD-10-CM | POA: Diagnosis not present

## 2019-10-17 HISTORY — PX: TOTAL HIP ARTHROPLASTY: SHX124

## 2019-10-17 SURGERY — ARTHROPLASTY, HIP, TOTAL, ANTERIOR APPROACH
Anesthesia: Spinal | Site: Hip | Laterality: Right

## 2019-10-17 MED ORDER — ONDANSETRON HCL 4 MG/2ML IJ SOLN
INTRAMUSCULAR | Status: DC | PRN
  Administered 2019-10-17: 4 mg via INTRAVENOUS

## 2019-10-17 MED ORDER — PROMETHAZINE HCL 25 MG/ML IJ SOLN: 6.2500 mg | INTRAMUSCULAR | Status: DC | PRN

## 2019-10-17 MED ORDER — BUPIVACAINE IN DEXTROSE 0.75-8.25 % IT SOLN
INTRATHECAL | Status: DC | PRN
Start: 2019-10-17 — End: 2019-10-17
  Administered 2019-10-17: 1.6 mL via INTRATHECAL

## 2019-10-17 MED ORDER — ONDANSETRON HCL 4 MG PO TABS: 4.0000 mg | ORAL_TABLET | Freq: Two times a day (BID) | ORAL | 0 refills | Status: AC

## 2019-10-17 MED ORDER — DEXAMETHASONE SODIUM PHOSPHATE 10 MG/ML IJ SOLN
INTRAMUSCULAR | Status: AC
  Filled 2019-10-17: qty 1

## 2019-10-17 MED ORDER — MIDAZOLAM HCL 5 MG/5ML IJ SOLN
INTRAMUSCULAR | Status: DC | PRN
  Administered 2019-10-17: 1 mg via INTRAVENOUS

## 2019-10-17 MED ORDER — BISACODYL 10 MG RE SUPP: 10.0000 mg | Freq: Every day | RECTAL | Status: DC | PRN

## 2019-10-17 MED ORDER — HYDROCODONE-ACETAMINOPHEN 5-325 MG PO TABS: 1.0000 | ORAL_TABLET | ORAL | 0 refills | Status: AC | PRN

## 2019-10-17 MED ORDER — KETOROLAC TROMETHAMINE 15 MG/ML IJ SOLN
15.0000 mg | Freq: Four times a day (QID) | INTRAMUSCULAR | Status: AC
  Administered 2019-10-17 – 2019-10-18 (×4): 15 mg via INTRAVENOUS
  Filled 2019-10-17 (×4): qty 1

## 2019-10-17 MED ORDER — DOCUSATE SODIUM 100 MG PO CAPS
100.0000 mg | ORAL_CAPSULE | Freq: Two times a day (BID) | ORAL | Status: DC
  Administered 2019-10-17 – 2019-10-18 (×2): 100 mg via ORAL
  Filled 2019-10-17 (×2): qty 1

## 2019-10-17 MED ORDER — PROPOFOL 500 MG/50ML IV EMUL
INTRAVENOUS | Status: DC | PRN
  Administered 2019-10-17: 25 ug/kg/min via INTRAVENOUS

## 2019-10-17 MED ORDER — ACETAMINOPHEN 10 MG/ML IV SOLN
1000.0000 mg | Freq: Once | INTRAVENOUS | Status: DC | PRN
  Administered 2019-10-17: 1000 mg via INTRAVENOUS

## 2019-10-17 MED ORDER — TRANEXAMIC ACID-NACL 1000-0.7 MG/100ML-% IV SOLN
1000.0000 mg | INTRAVENOUS | Status: AC
  Administered 2019-10-17: 1000 mg via INTRAVENOUS

## 2019-10-17 MED ORDER — FENTANYL CITRATE (PF) 100 MCG/2ML IJ SOLN
INTRAMUSCULAR | Status: DC | PRN
  Administered 2019-10-17 (×3): 25 ug via INTRAVENOUS

## 2019-10-17 MED ORDER — FENTANYL CITRATE (PF) 100 MCG/2ML IJ SOLN: 25.0000 ug | INTRAMUSCULAR | Status: DC | PRN

## 2019-10-17 MED ORDER — ALBUMIN HUMAN 5 % IV SOLN
INTRAVENOUS | Status: AC
  Filled 2019-10-17: qty 250

## 2019-10-17 MED ORDER — ACETAMINOPHEN 500 MG PO TABS
500.0000 mg | ORAL_TABLET | Freq: Four times a day (QID) | ORAL | Status: AC
  Administered 2019-10-17 – 2019-10-18 (×3): 500 mg via ORAL
  Filled 2019-10-17 (×3): qty 1

## 2019-10-17 MED ORDER — ONDANSETRON HCL 4 MG/2ML IJ SOLN
INTRAMUSCULAR | Status: AC
  Filled 2019-10-17: qty 2

## 2019-10-17 MED ORDER — FENTANYL CITRATE (PF) 100 MCG/2ML IJ SOLN
INTRAMUSCULAR | Status: AC
  Filled 2019-10-17: qty 2

## 2019-10-17 MED ORDER — POVIDONE-IODINE 10 % EX SWAB: 2.0000 "application " | Freq: Once | CUTANEOUS | Status: DC

## 2019-10-17 MED ORDER — MORPHINE SULFATE (PF) 4 MG/ML IV SOLN: 0.5000 mg | INTRAVENOUS | Status: DC | PRN

## 2019-10-17 MED ORDER — CEFAZOLIN SODIUM-DEXTROSE 2-4 GM/100ML-% IV SOLN
2.0000 g | INTRAVENOUS | Status: AC
  Administered 2019-10-17: 2 g via INTRAVENOUS

## 2019-10-17 MED ORDER — ONDANSETRON HCL 4 MG PO TABS: 4.0000 mg | ORAL_TABLET | Freq: Three times a day (TID) | ORAL | Status: DC | PRN

## 2019-10-17 MED ORDER — LACTATED RINGERS IV SOLN: INTRAVENOUS | Status: DC

## 2019-10-17 MED ORDER — POVIDONE-IODINE 7.5 % EX SOLN: Freq: Once | CUTANEOUS | Status: DC

## 2019-10-17 MED ORDER — ACETAMINOPHEN 325 MG PO TABS: 325.0000 mg | ORAL_TABLET | Freq: Four times a day (QID) | ORAL | Status: DC | PRN

## 2019-10-17 MED ORDER — MIDAZOLAM HCL 2 MG/2ML IJ SOLN
INTRAMUSCULAR | Status: AC
  Filled 2019-10-17: qty 2

## 2019-10-17 MED ORDER — EPHEDRINE 5 MG/ML INJ
INTRAVENOUS | Status: AC
  Filled 2019-10-17: qty 10

## 2019-10-17 MED ORDER — DEXAMETHASONE SODIUM PHOSPHATE 10 MG/ML IJ SOLN
8.0000 mg | Freq: Once | INTRAMUSCULAR | Status: AC
  Administered 2019-10-17: 8 mg via INTRAVENOUS

## 2019-10-17 MED ORDER — ACETAMINOPHEN 10 MG/ML IV SOLN
INTRAVENOUS | Status: AC
  Filled 2019-10-17: qty 100

## 2019-10-17 MED ORDER — MEPERIDINE HCL 50 MG/ML IJ SOLN: 6.2500 mg | INTRAMUSCULAR | Status: DC | PRN

## 2019-10-17 MED ORDER — HYDROCODONE-ACETAMINOPHEN 5-325 MG PO TABS: 1.0000 | ORAL_TABLET | ORAL | Status: DC | PRN

## 2019-10-17 MED ORDER — WATER FOR IRRIGATION, STERILE IR SOLN
Status: DC | PRN
  Administered 2019-10-17: 2000 mL

## 2019-10-17 MED ORDER — ORAL CARE MOUTH RINSE: 15.0000 mL | Freq: Once | OROMUCOSAL | Status: AC

## 2019-10-17 MED ORDER — BUPIVACAINE LIPOSOME 1.3 % IJ SUSP
10.0000 mL | Freq: Once | INTRAMUSCULAR | Status: AC
  Administered 2019-10-17: 10 mL
  Filled 2019-10-17: qty 10

## 2019-10-17 MED ORDER — ALBUMIN HUMAN 5 % IV SOLN: INTRAVENOUS | Status: DC | PRN

## 2019-10-17 MED ORDER — 0.9 % SODIUM CHLORIDE (POUR BTL) OPTIME
TOPICAL | Status: DC | PRN
  Administered 2019-10-17: 1000 mL

## 2019-10-17 MED ORDER — BACLOFEN 10 MG PO TABS
10.0000 mg | ORAL_TABLET | Freq: Two times a day (BID) | ORAL | 1 refills | Status: DC
Start: 2019-10-17 — End: 2020-07-02

## 2019-10-17 MED ORDER — TRANEXAMIC ACID-NACL 1000-0.7 MG/100ML-% IV SOLN
INTRAVENOUS | Status: AC
  Filled 2019-10-17: qty 100

## 2019-10-17 MED ORDER — PROPOFOL 500 MG/50ML IV EMUL
INTRAVENOUS | Status: AC
  Filled 2019-10-17: qty 50

## 2019-10-17 MED ORDER — LIDOCAINE 2% (20 MG/ML) 5 ML SYRINGE
INTRAMUSCULAR | Status: AC
  Filled 2019-10-17: qty 5

## 2019-10-17 MED ORDER — POVIDONE-IODINE 10 % EX SWAB
2.0000 "application " | Freq: Once | CUTANEOUS | Status: AC
  Administered 2019-10-17: 2 via TOPICAL

## 2019-10-17 MED ORDER — METOPROLOL TARTRATE 25 MG PO TABS
25.0000 mg | ORAL_TABLET | Freq: Once | ORAL | Status: AC
  Administered 2019-10-17: 25 mg via ORAL
  Filled 2019-10-17: qty 1

## 2019-10-17 MED ORDER — POLYETHYLENE GLYCOL 3350 17 G PO PACK: 17.0000 g | PACK | Freq: Every day | ORAL | Status: DC | PRN

## 2019-10-17 MED ORDER — CHLORHEXIDINE GLUCONATE 0.12 % MT SOLN
15.0000 mL | Freq: Once | OROMUCOSAL | Status: AC
  Administered 2019-10-17: 15 mL via OROMUCOSAL

## 2019-10-17 MED ORDER — SODIUM CHLORIDE (PF) 0.9 % IJ SOLN
INTRAMUSCULAR | Status: AC
  Filled 2019-10-17: qty 20

## 2019-10-17 MED ORDER — CEFAZOLIN SODIUM-DEXTROSE 2-4 GM/100ML-% IV SOLN
INTRAVENOUS | Status: AC
  Filled 2019-10-17: qty 100

## 2019-10-17 MED ORDER — EPHEDRINE SULFATE 50 MG/ML IJ SOLN
INTRAMUSCULAR | Status: DC | PRN
  Administered 2019-10-17 (×2): 10 mg via INTRAVENOUS
  Administered 2019-10-17: 7 mg via INTRAVENOUS

## 2019-10-17 MED ORDER — SODIUM CHLORIDE FLUSH 0.9 % IV SOLN
INTRAVENOUS | Status: DC | PRN
  Administered 2019-10-17: 20 mL

## 2019-10-17 SURGICAL SUPPLY — 49 items
APL PRP STRL LF DISP 70% ISPRP (MISCELLANEOUS) ×1
BAG SPEC THK2 15X12 ZIP CLS (MISCELLANEOUS)
BAG ZIPLOCK 12X15 (MISCELLANEOUS) IMPLANT
BLADE SAG 18X100X1.27 (BLADE) ×3 IMPLANT
BLADE SURG SZ10 CARB STEEL (BLADE) ×6 IMPLANT
CHLORAPREP W/TINT 26 (MISCELLANEOUS) ×3 IMPLANT
CLOSURE STERI-STRIP 1/2X4 (GAUZE/BANDAGES/DRESSINGS) ×1
CLSR STERI-STRIP ANTIMIC 1/2X4 (GAUZE/BANDAGES/DRESSINGS) ×2 IMPLANT
COVER PERINEAL POST (MISCELLANEOUS) ×3 IMPLANT
COVER SURGICAL LIGHT HANDLE (MISCELLANEOUS) ×3 IMPLANT
COVER WAND RF STERILE (DRAPES) ×2 IMPLANT
DECANTER SPIKE VIAL GLASS SM (MISCELLANEOUS) ×6 IMPLANT
DRAPE IMP U-DRAPE 54X76 (DRAPES) ×3 IMPLANT
DRAPE STERI IOBAN 125X83 (DRAPES) ×3 IMPLANT
DRAPE U-SHAPE 47X51 STRL (DRAPES) ×6 IMPLANT
DRSG MEPILEX BORDER 4X8 (GAUZE/BANDAGES/DRESSINGS) ×3 IMPLANT
ELECT BLADE TIP CTD 4 INCH (ELECTRODE) ×3 IMPLANT
ELECT REM PT RETURN 15FT ADLT (MISCELLANEOUS) ×3 IMPLANT
GLOVE BIO SURGEON STRL SZ7.5 (GLOVE) ×6 IMPLANT
GLOVE BIOGEL PI IND STRL 7.5 (GLOVE) ×1 IMPLANT
GLOVE BIOGEL PI IND STRL 8 (GLOVE) ×1 IMPLANT
GLOVE BIOGEL PI INDICATOR 7.5 (GLOVE) ×2
GLOVE BIOGEL PI INDICATOR 8 (GLOVE) ×2
GOWN STRL REUS W/TWL LRG LVL3 (GOWN DISPOSABLE) ×3 IMPLANT
GOWN STRL REUS W/TWL XL LVL3 (GOWN DISPOSABLE) ×3 IMPLANT
HEAD BIOLOX HIP 36/-2.5 (Joint) IMPLANT
HIP BIOLOX HD 36/-2.5 (Joint) ×3 IMPLANT
HOLDER FOLEY CATH W/STRAP (MISCELLANEOUS) ×2 IMPLANT
INSERT 0 DEGREE 36 (Miscellaneous) ×2 IMPLANT
KIT TURNOVER KIT A (KITS) IMPLANT
MANIFOLD NEPTUNE II (INSTRUMENTS) ×3 IMPLANT
NS IRRIG 1000ML POUR BTL (IV SOLUTION) ×3 IMPLANT
PACK ANTERIOR HIP CUSTOM (KITS) ×3 IMPLANT
PROTECTOR NERVE ULNAR (MISCELLANEOUS) ×3 IMPLANT
SCREW HEX LP 6.5X20 (Screw) ×2 IMPLANT
SHELL TRIDENT II CLUST 50 (Shell) ×2 IMPLANT
STEM ACCOLADE SZ 6 (Hips) ×2 IMPLANT
SUT MNCRL AB 4-0 PS2 18 (SUTURE) ×3 IMPLANT
SUT STRATAFIX 0 PDS 27 VIOLET (SUTURE) ×3
SUT VIC AB 0 CT1 36 (SUTURE) ×3 IMPLANT
SUT VIC AB 1 CT1 36 (SUTURE) ×3 IMPLANT
SUT VIC AB 2-0 CT1 27 (SUTURE) ×6
SUT VIC AB 2-0 CT1 TAPERPNT 27 (SUTURE) ×2 IMPLANT
SUTURE STRATFX 0 PDS 27 VIOLET (SUTURE) ×1 IMPLANT
TAPE STRIPS DRAPE STRL (GAUZE/BANDAGES/DRESSINGS) ×2 IMPLANT
TRAY FOLEY MTR SLVR 14FR STAT (SET/KITS/TRAYS/PACK) ×2 IMPLANT
TRAY FOLEY MTR SLVR 16FR STAT (SET/KITS/TRAYS/PACK) IMPLANT
WATER STERILE IRR 1000ML POUR (IV SOLUTION) ×6 IMPLANT
YANKAUER SUCT BULB TIP 10FT TU (MISCELLANEOUS) ×3 IMPLANT

## 2019-10-17 NOTE — Anesthesia Procedure Notes (Signed)
Spinal  Patient location during procedure: OR Start time: 10/17/2019 10:41 AM End time: 10/17/2019 10:47 AM Staffing Performed: resident/CRNA  Resident/CRNA: Garrel Ridgel, CRNA Preanesthetic Checklist Completed: patient identified, IV checked, site marked, risks and benefits discussed, surgical consent, monitors and equipment checked, pre-op evaluation and timeout performed Spinal Block Patient position: sitting Prep: DuraPrep Patient monitoring: heart rate, cardiac monitor, continuous pulse ox and blood pressure Approach: midline Location: L3-4 Injection technique: single-shot Needle Needle type: Sprotte  Needle gauge: 24 G Needle length: 9 cm Needle insertion depth: 5 cm Assessment Sensory level: T8 Additional Notes IV functioning, monitors applied to pt. Expiration date of kit checked and confirmed to be in date. Sterile prep and drape, hand hygiene and sterile gloved used. Pt was positioned and spine was prepped in sterile fashion. Skin was anesthetized with lidocaine. Free flow of clear CSF obtained prior to injecting local anesthetic into CSF x 1 attempt. Spinal needle aspirated freely following injection. Needle was carefully withdrawn, and pt tolerated procedure well. Loss of motor and sensory on exam post injection.

## 2019-10-17 NOTE — Anesthesia Postprocedure Evaluation (Signed)
Anesthesia Post Note  Patient: Lori [name]  Procedure(s) Performed: TOTAL HIP ARTHROPLASTY ANTERIOR APPROACH (Right Hip)     Patient location during evaluation: PACU Anesthesia Type: Spinal Level of consciousness: awake Pain management: pain level controlled Vital Signs Assessment: post-procedure vital signs reviewed and stable Respiratory status: spontaneous breathing Cardiovascular status: stable Postop Assessment: no headache, no backache, spinal receding, patient able to bend at knees and no apparent nausea or vomiting Anesthetic complications: no   No complications documented.  Last Vitals:  Vitals:   10/17/19 1330 10/17/19 1345  BP: (!) 152/69 (!) 158/76  Pulse: (!) 53 (!) 58  Resp: 15 17  Temp:    SpO2: 100% 100%    Last Pain:  Vitals:   10/17/19 1330  TempSrc:   PainSc: 0-No pain                 Huston Foley

## 2019-10-17 NOTE — Interval H&P Note (Signed)
History and Physical Interval Note:  10/17/2019 9:27 AM  Lori [name]  has presented today for surgery, with the diagnosis of OA RIGHT HIP.  The various methods of treatment have been discussed with the patient and family. After consideration of risks, benefits and other options for treatment, the patient has consented to  Procedure(s): TOTAL HIP ARTHROPLASTY ANTERIOR APPROACH (Right) as a surgical intervention.  The patient's history has been reviewed, patient examined, no change in status, stable for surgery.  I have reviewed the patient's chart and labs.  Questions were answered to the patient's satisfaction.     Renette Butters

## 2019-10-17 NOTE — Discharge Instructions (Signed)
INSTRUCTIONS AFTER JOINT REPLACEMENT   o Remove items at home which could result in a fall. This includes throw rugs or furniture in walking pathways o ICE to the affected joint every three hours while awake for 30 minutes at a time, for at least the first 3-5 days, and then as needed for pain and swelling.  Continue to use ice for pain and swelling. You may notice swelling that will progress down to the foot and ankle.  This is normal after surgery.  Elevate your leg when you are not up walking on it.   o Continue to use the breathing machine you got in the hospital (incentive spirometer) which will help keep your temperature down.  It is common for your temperature to cycle up and down following surgery, especially at night when you are not up moving around and exerting yourself.  The breathing machine keeps your lungs expanded and your temperature down.   DIET:  As you were doing prior to hospitalization, we recommend a well-balanced diet.  DRESSING / WOUND CARE / SHOWERING  You may shower 3 days after surgery, but keep the wounds dry during showering.  You may use an occlusive plastic wrap (Press'n Seal for example), NO SOAKING/SUBMERGING IN THE BATHTUB.  If the bandage gets wet, change with a clean dry gauze.  If the incision gets wet, pat the wound dry with a clean towel.  ACTIVITY  o Increase activity slowly as tolerated, but follow the weight bearing instructions below.   o No driving for 6 weeks or until further direction given by your physician.  You cannot drive while taking narcotics.  o No lifting or carrying greater than 10 lbs. until further directed by your surgeon. o Avoid periods of inactivity such as sitting longer than an hour when not asleep. This helps prevent blood clots.  o You may return to work once you are authorized by your doctor.     WEIGHT BEARING   Weight bearing as tolerated with assist device (walker, cane, etc) as directed, use it as long as suggested by  your surgeon or therapist, typically at least 4-6 weeks.   EXERCISES  Results after joint replacement surgery are often greatly improved when you follow the exercise, range of motion and muscle strengthening exercises prescribed by your doctor. Safety measures are also important to protect the joint from further injury. Any time any of these exercises cause you to have increased pain or swelling, decrease what you are doing until you are comfortable again and then slowly increase them. If you have problems or questions, call your caregiver or physical therapist for advice.   Rehabilitation is important following a joint replacement. After just a few days of immobilization, the muscles of the leg can become weakened and shrink (atrophy).  These exercises are designed to build up the tone and strength of the thigh and leg muscles and to improve motion. Often times heat used for twenty to thirty minutes before working out will loosen up your tissues and help with improving the range of motion but do not use heat for the first two weeks following surgery (sometimes heat can increase post-operative swelling).   These exercises can be done on a training (exercise) mat, on the floor, on a table or on a bed. Use whatever works the best and is most comfortable for you.    Use music or television while you are exercising so that the exercises are a pleasant break in your day. This will   make your life better with the exercises acting as a break in your routine that you can look forward to.   Perform all exercises about fifteen times, three times per day or as directed.  You should exercise both the operative leg and the other leg as well.  Exercises include:   . Quad Sets - Tighten up the muscle on the front of the thigh (Quad) and hold for 5-10 seconds.   . Straight Leg Raises - With your knee straight (if you were given a brace, keep it on), lift the leg to 60 degrees, hold for 3 seconds, and slowly lower the  leg.  Perform this exercise against resistance later as your leg gets stronger.  . Leg Slides: Lying on your back, slowly slide your foot toward your buttocks, bending your knee up off the floor (only go as far as is comfortable). Then slowly slide your foot back down until your leg is flat on the floor again.  . Angel Wings: Lying on your back spread your legs to the side as far apart as you can without causing discomfort.  . Hamstring Strength:  Lying on your back, push your heel against the floor with your leg straight by tightening up the muscles of your buttocks.  Repeat, but this time bend your knee to a comfortable angle, and push your heel against the floor.  You may put a pillow under the heel to make it more comfortable if necessary.   A rehabilitation program following joint replacement surgery can speed recovery and prevent re-injury in the future due to weakened muscles. Contact your doctor or a physical therapist for more information on knee rehabilitation.    CONSTIPATION  Constipation is defined medically as fewer than three stools per week and severe constipation as less than one stool per week.  Even if you have a regular bowel pattern at home, your normal regimen is likely to be disrupted due to multiple reasons following surgery.  Combination of anesthesia, postoperative narcotics, change in appetite and fluid intake all can affect your bowels.   YOU MUST use at least one of the following options; they are listed in order of increasing strength to get the job done.  They are all available over the counter, and you may need to use some, POSSIBLY even all of these options:    Drink plenty of fluids (prune juice may be helpful) and high fiber foods Colace 100 mg by mouth twice a day  Senokot for constipation as directed and as needed Dulcolax (bisacodyl), take with full glass of water  Miralax (polyethylene glycol) once or twice a day as needed.  If you have tried all these things  and are unable to have a bowel movement in the first 3-4 days after surgery call either your surgeon or your primary doctor.    If you experience loose stools or diarrhea, hold the medications until you stool forms back up.  If your symptoms do not get better within 1 week or if they get worse, check with your doctor.  If you experience "the worst abdominal pain ever" or develop nausea or vomiting, please contact the office immediately for further recommendations for treatment.   ITCHING:  If you experience itching with your medications, try taking only a single pain pill, or even half a pain pill at a time.  You can also use Benadryl over the counter for itching or also to help with sleep.   TED HOSE STOCKINGS:  Use stockings   on both legs until for at least 2 weeks or as directed by physician office. They may be removed at night for sleeping.  MEDICATIONS:  See your medication summary on the "After Visit Summary" that nursing will review with you.  You may have some home medications which will be placed on hold until you complete the course of blood thinner medication.  It is important for you to complete the blood thinner medication as prescribed.  PRECAUTIONS:  If you experience chest pain or shortness of breath - call 911 immediately for transfer to the hospital emergency department.   If you develop a fever greater that 101 F, purulent drainage from wound, increased redness or drainage from wound, foul odor from the wound/dressing, or calf pain - CONTACT YOUR SURGEON.                                                   FOLLOW-UP APPOINTMENTS:  If you do not already have a post-op appointment, please call the office for an appointment to be seen by your surgeon.  Guidelines for how soon to be seen are listed in your "After Visit Summary", but are typically between 1-4 weeks after surgery.  OTHER INSTRUCTIONS:   Knee Replacement:  Do not place pillow under knee, focus on keeping the knee straight  while resting. CPM instructions: 0-90 degrees, 2 hours in the morning, 2 hours in the afternoon, and 2 hours in the evening. Place foam block, curve side up under heel at all times except when in CPM or when walking.  DO NOT modify, tear, cut, or change the foam block in any way.   DENTAL ANTIBIOTICS:  In most cases prophylactic antibiotics for Dental procdeures after total joint surgery are not necessary.  Exceptions are as follows:  1. History of prior total joint infection  2. Severely immunocompromised (Organ Transplant, cancer chemotherapy, Rheumatoid biologic meds such as Humera)  3. Poorly controlled diabetes (A1C &gt; 8.0, blood glucose over 200)  If you have one of these conditions, contact your surgeon for an antibiotic prescription, prior to your dental procedure.   MAKE SURE YOU:  . Understand these instructions.  . Get help right away if you are not doing well or get worse.    Thank you for letting us be a part of your medical care team.  It is a privilege we respect greatly.  We hope these instructions will help you stay on track for a fast and full recovery!    

## 2019-10-17 NOTE — Transfer of Care (Signed)
Immediate Anesthesia Transfer of Care Note  Patient: Lori [name]  Procedure(s) Performed: TOTAL HIP ARTHROPLASTY ANTERIOR APPROACH (Right Hip)  Patient Location: PACU  Anesthesia Type:Spinal  Level of Consciousness: awake, alert , oriented and patient cooperative  Airway & Oxygen Therapy: Patient Spontanous Breathing and Patient connected to face mask oxygen  Post-op Assessment: Report given to RN and Post -op Vital signs reviewed and stable  Post vital signs: Reviewed and stable  Last Vitals:  Vitals Value Taken Time  BP 135/78 10/17/19 1300  Temp    Pulse 58 10/17/19 1301  Resp 15 10/17/19 1301  SpO2 100 % 10/17/19 1301  Vitals shown include unvalidated device data.  Last Pain:  Vitals:   10/17/19 0757  TempSrc: Oral      Patients Stated Pain Goal: 4 (78/67/67 2094)  Complications: No complications documented.

## 2019-10-17 NOTE — Op Note (Signed)
10/17/2019  1:31 PM  PATIENT:  Lori [name]   MRN: [id number]  PRE-OPERATIVE DIAGNOSIS:  OA RIGHT HIP  POST-OPERATIVE DIAGNOSIS:  OA RIGHT HIP  PROCEDURE:  Procedure(s): TOTAL HIP ARTHROPLASTY ANTERIOR APPROACH  PREOPERATIVE INDICATIONS:    Lori [name] is an 79 y.o. female who has a diagnosis of Primary localized osteoarthritis of right hip and elected for surgical management after failing conservative treatment.  The risks benefits and alternatives were discussed with the patient including but not limited to the risks of nonoperative treatment, versus surgical intervention including infection, bleeding, nerve injury, periprosthetic fracture, the need for revision surgery, dislocation, leg length discrepancy, blood clots, cardiopulmonary complications, morbidity, mortality, among others, and they were willing to proceed.     OPERATIVE REPORT     SURGEON:   Renette Butters, MD    ASSISTANT:  Margy Clarks, PA-C, he was present and scrubbed throughout the case, critical for completion in a timely fashion, and for retraction, instrumentation, and closure.     ANESTHESIA:  General    COMPLICATIONS:  None.     COMPONENTS:  Stryker acolade fit femur size 6 with a 36 mm -2.5 head ball and an acetabular shell size 50 with a  polyethylene liner    PROCEDURE IN DETAIL:   The patient was met in the holding area and  identified.  The appropriate hip was identified and marked at the operative site.  The patient was then transported to the OR  and  placed under anesthesia per that record.  At that point, the patient was  placed in the supine position and  secured to the operating room table and all bony prominences padded. He received pre-operative antibiotics    The operative lower extremity was prepped from the iliac crest to the distal leg.  Sterile draping was performed.  Time out was performed prior to incision.      Skin incision was made just 2 cm lateral to the ASIS  extending in  line with the tensor fascia lata. Electrocautery was used to control all bleeders. I dissected down sharply to the fascia of the tensor fascia lata was confirmed that the muscle fibers beneath were running posteriorly. I then incised the fascia over the superficial tensor fascia lata in line with the incision. The fascia was elevated off the anterior aspect of the muscle the muscle was retracted posteriorly and protected throughout the case. I then used electrocautery to incise the tensor fascia lata fascia control and all bleeders. Immediately visible was the fat over top of the anterior neck and capsule.  I removed the anterior fat from the capsule and elevated the rectus muscle off of the anterior capsule. I then removed a large time of capsule. The retractors were then placed over the anterior acetabulum as well as around the superior and inferior neck.  I then made a femoral neck cut. Then used the power corkscrew to remove the femoral head from the acetabulum and thoroughly irrigated the acetabulum. I sized the femoral head.    I then exposed the deep acetabulum, cleared out any tissue including the ligamentum teres.   After adequate visualization, I excised the labrum, and then sequentially reamed.  I then impacted the acetabular implant into place using fluoroscopy for guidance.  Appropriate version and inclination was confirmed clinically matching their bony anatomy, and with fluoroscopy.  I placed a 20 mm screw in the posterior/superio position with an excellent bite.    I then placed the polyethylene  liner in place  I then adducted the leg and released the external rotators from the posterior femur allowing it to be easily delivered up lateral and anterior to the acetabulum for preparation of the femoral canal.    I then prepared the proximal femur using the cookie-cutter and then sequentially reamed and broached.  A trial broach, neck, and head was utilized, and I reduced the hip and used  floroscopy to assess the neck length and femoral implant.  I then impacted the femoral prosthesis into place into the appropriate version. The hip was then reduced and fluoroscopy confirmed appropriate position. Leg lengths were restored.  I then irrigated the hip copiously again with, and repaired the fascia with Vicryl, followed by monocryl for the subcutaneous tissue, Monocryl for the skin, Steri-Strips and sterile gauze. The patient was then awakened and returned to PACU in stable and satisfactory condition. There were no complications.  POST OPERATIVE PLAN: WBAT, DVT px: SCD's/TED, ambulation and chemical dvt px  Edmonia Lynch, MD Orthopedic Surgeon [phone]

## 2019-10-17 NOTE — Anesthesia Procedure Notes (Signed)
Spinal  Patient location during procedure: OR Start time: 10/17/2019 10:47 AM Staffing Performed: resident/CRNA  Resident/CRNA: Garrel Ridgel, CRNA Preanesthetic Checklist Completed: patient identified, IV checked, site marked, risks and benefits discussed, surgical consent, monitors and equipment checked, pre-op evaluation and timeout performed Spinal Block Patient position: sitting Prep: DuraPrep Patient monitoring: heart rate, cardiac monitor, continuous pulse ox and blood pressure Approach: midline Location: L3-4 Injection technique: single-shot Needle Needle type: Sprotte  Needle gauge: 24 G Needle length: 9 cm Assessment Sensory level: T4 Additional Notes IV functioning, monitors applied to pt. Expiration date of kit checked and confirmed to be in date. Sterile prep and drape, hand hygiene and sterile gloved used. Pt was positioned and spine was prepped in sterile fashion. Skin was anesthetized with lidocaine. Free flow of clear CSF obtained prior to injecting local anesthetic into CSF x 1 attempt. Spinal needle aspirated freely following injection. Needle was carefully withdrawn, and pt tolerated procedure well. Loss of motor and sensory on exam post injection.

## 2019-10-17 NOTE — Evaluation (Signed)
Physical Therapy Evaluation Patient Details Name: Lori [name] MRN: [id number] DOB: 10/23/1940 Today's Date: 10/17/2019   History of Present Illness  79 y.o. female admitted 10/17/19 for R AA-THA. PMH includes spinal stenosis, pulmonary embolism, a fib, vertigo 2* brain injury.  Clinical Impression  Pt is s/p THA resulting in the deficits listed below (see PT Problem List). Pt ambulated 78' with RW, distance limited by pain. Initiated THA HEP. Good progress expected.  Pt will benefit from skilled PT to increase their independence and safety with mobility to allow discharge to the venue listed below.      Follow Up Recommendations Follow surgeon's recommendation for DC plan and follow-up therapies    Equipment Recommendations  None recommended by PT    Recommendations for Other Services       Precautions / Restrictions Precautions Precautions: Fall Restrictions Weight Bearing Restrictions: No Other Position/Activity Restrictions: WBAT      Mobility  Bed Mobility Overal bed mobility: Needs Assistance Bed Mobility: Supine to Sit     Supine to sit: Mod assist     General bed mobility comments: mod A to raise trunk  Transfers Overall transfer level: Needs assistance Equipment used: Rolling walker (2 wheeled) Transfers: Sit to/from Stand Sit to Stand: Min assist;From elevated surface         General transfer comment: VCs hand placement  Ambulation/Gait Ambulation/Gait assistance: Min guard Gait Distance (Feet): 36 Feet Assistive device: Rolling walker (2 wheeled) Gait Pattern/deviations: Step-to pattern;Decreased step length - right;Decreased step length - left;Decreased stance time - right Gait velocity: decr   General Gait Details: VCs sequencing, no loss of balance, distance limited by pain  Stairs            Wheelchair Mobility    Modified Rankin (Stroke Patients Only)       Balance Overall balance assessment: Modified Independent                                            Pertinent Vitals/Pain Pain Assessment: Faces Faces Pain Scale: Hurts whole lot Pain Location: R hip with walking Pain Descriptors / Indicators: Burning Pain Intervention(s): Limited activity within patient's tolerance;Monitored during session;Premedicated before session;Ice applied    Home Living Family/patient expects to be discharged to:: Private residence Living Arrangements: Alone Available Help at Discharge: Friend(s);Available 24 hours/day Type of Home: House Home Access: Stairs to enter Entrance Stairs-Rails: Right;Left;Can reach both Entrance Stairs-Number of Steps: 3 Home Layout: Two level;Able to live on main level with bedroom/bathroom Home Equipment: Gilford Rile - 2 wheels;Cane - quad;Toilet riser;Cane - single point Additional Comments: walked with quad cane and SPC PTA; drives    Prior Function Level of Independence: Independent with assistive device(s)               Hand Dominance        Extremity/Trunk Assessment   Upper Extremity Assessment Upper Extremity Assessment: Overall WFL for tasks assessed    Lower Extremity Assessment Lower Extremity Assessment: RLE deficits/detail RLE Deficits / Details: R hip flexion AAROM ~45*, abduction ~ 15* RLE Sensation: WNL RLE Coordination: WNL    Cervical / Trunk Assessment Cervical / Trunk Assessment: Normal  Communication   Communication: No difficulties  Cognition Arousal/Alertness: Awake/alert Behavior During Therapy: WFL for tasks assessed/performed Overall Cognitive Status: Within Functional Limits for tasks assessed  General Comments      Exercises Total Joint Exercises Ankle Circles/Pumps: AROM;Both;10 reps;Supine Heel Slides: AAROM;Right;5 reps;Supine Hip ABduction/ADduction: AAROM;Right;5 reps;Supine   Assessment/Plan    PT Assessment Patient needs continued PT services  PT Problem List  Decreased strength;Decreased mobility;Decreased activity tolerance;Pain       PT Treatment Interventions Gait training;Therapeutic activities;Therapeutic exercise;Functional mobility training;Stair training;Patient/family education    PT Goals (Current goals can be found in the Care Plan section)  Acute Rehab PT Goals Patient Stated Goal: return to living independently PT Goal Formulation: With patient Time For Goal Achievement: 10/24/19 Potential to Achieve Goals: Good    Frequency 7X/week   Barriers to discharge        Co-evaluation               AM-PAC PT "6 Clicks" Mobility  Outcome Measure Help needed turning from your back to your side while in a flat bed without using bedrails?: A Little Help needed moving from lying on your back to sitting on the side of a flat bed without using bedrails?: A Little Help needed moving to and from a bed to a chair (including a wheelchair)?: A Little Help needed standing up from a chair using your arms (e.g., wheelchair or bedside chair)?: A Little Help needed to walk in hospital room?: A Little Help needed climbing 3-5 steps with a railing? : A Lot 6 Click Score: 17    End of Session Equipment Utilized During Treatment: Gait belt Activity Tolerance: Patient tolerated treatment well;Patient limited by pain Patient left: in chair;with call bell/phone within reach;with chair alarm set Nurse Communication: Mobility status PT Visit Diagnosis: Difficulty in walking, not elsewhere classified (R26.2);Pain Pain - Right/Left: Right Pain - part of body: Hip    Time: 2336-1224 PT Time Calculation (min) (ACUTE ONLY): 20 min   Charges:   PT Evaluation $PT Eval Low Complexity: 1 Low          Blondell Reveal Kistler PT 10/17/2019  Acute Rehabilitation Services Pager [phone] Office [phone]

## 2019-10-18 ENCOUNTER — Encounter (HOSPITAL_COMMUNITY): Payer: Self-pay | Admitting: Orthopedic Surgery

## 2019-10-18 MED ORDER — ONDANSETRON HCL 4 MG PO TABS: 4.0000 mg | ORAL_TABLET | Freq: Three times a day (TID) | ORAL | 0 refills | Status: DC | PRN

## 2019-10-18 MED ORDER — HYDROCODONE-ACETAMINOPHEN 5-325 MG PO TABS: 1.0000 | ORAL_TABLET | ORAL | 0 refills | Status: DC | PRN

## 2019-10-18 MED ORDER — APIXABAN 5 MG PO TABS
5.0000 mg | ORAL_TABLET | Freq: Two times a day (BID) | ORAL | Status: DC
  Administered 2019-10-18: 5 mg via ORAL
  Filled 2019-10-18: qty 1

## 2019-10-18 MED ORDER — ACETAMINOPHEN 325 MG PO TABS: 325.0000 mg | ORAL_TABLET | Freq: Four times a day (QID) | ORAL | 0 refills | Status: DC | PRN

## 2019-10-18 NOTE — Progress Notes (Signed)
Physical Therapy Treatment Patient Details Name: Lori [name] MRN: [id number] DOB: [birth year] Today's Date: 10/18/2019    History of Present Illness 79 y.o. female admitted 10/17/19 for R AA-THA. PMH includes spinal stenosis, pulmonary embolism, a fib, vertigo 2* brain injury.    PT Comments    POD # 1 pm session Assisted with amb a greater distance in hallway, practiced stairs one last time Then returned to room to perform some TE's following HEP handout.  Instructed on proper tech, freq as well as use of ICE.   Addressed all mobility questions, discussed appropriate activity, educated on use of ICE.  Pt ready for D/C to home.   Follow Up Recommendations  Follow surgeon's recommendation for DC plan and follow-up therapies (HEP)     Equipment Recommendations  None recommended by PT    Recommendations for Other Services       Precautions / Restrictions Precautions Precautions: Fall Restrictions Weight Bearing Restrictions: No Other Position/Activity Restrictions: WBAT    Mobility  Bed Mobility               General bed mobility comments: OOB in recliner  Transfers Overall transfer level: Needs assistance Equipment used: Rolling walker (2 wheeled) Transfers: Sit to/from Stand Sit to Stand: Supervision         General transfer comment: increased time and good use of hands to steady self  Ambulation/Gait Ambulation/Gait assistance: Supervision;Min guard Gait Distance (Feet): 63 Feet Assistive device: Rolling walker (2 wheeled) Gait Pattern/deviations: Step-to pattern;Decreased step length - right;Decreased step length - left;Decreased stance time - right Gait velocity: decr   General Gait Details: <25% VCs sequencing, no loss of balance, tolerated an increased distance   Stairs Stairs: Yes Stairs assistance: Min guard;Min assist Stair Management: Two rails;Step to pattern;Forwards Number of Stairs: 2 General stair comments: 50% VC's on proper sequencing  and safety   Wheelchair Mobility    Modified Rankin (Stroke Patients Only)       Balance                                            Cognition Arousal/Alertness: Awake/alert Behavior During Therapy: WFL for tasks assessed/performed Overall Cognitive Status: Within Functional Limits for tasks assessed                                 General Comments: AxO x 3 motivated      Exercises   Total Hip Replacement TE's following HEP Handout 10 reps ankle pumps 05 reps knee presses 05 reps heel slides 05 reps SAQ's 05 reps ABD Instructed how to use a belt loop to assist  Followed by ICE     General Comments        Pertinent Vitals/Pain Pain Assessment: 0-10 Pain Score: 7  Pain Location: R hip with walking Pain Descriptors / Indicators: Burning Pain Intervention(s): Monitored during session;Premedicated before session;Repositioned;Ice applied    Home Living                      Prior Function            PT Goals (current goals can now be found in the care plan section) Progress towards PT goals: Progressing toward goals    Frequency    7X/week      PT  Plan Current plan remains appropriate    Co-evaluation              AM-PAC PT "6 Clicks" Mobility   Outcome Measure  Help needed turning from your back to your side while in a flat bed without using bedrails?: None Help needed moving from lying on your back to sitting on the side of a flat bed without using bedrails?: None Help needed moving to and from a bed to a chair (including a wheelchair)?: A Little Help needed standing up from a chair using your arms (e.g., wheelchair or bedside chair)?: A Little Help needed to walk in hospital room?: A Little Help needed climbing 3-5 steps with a railing? : A Little 6 Click Score: 20    End of Session Equipment Utilized During Treatment: Gait belt Activity Tolerance: Patient tolerated treatment well;Patient limited  by pain Patient left: in chair;with call bell/phone within reach;with chair alarm set Nurse Communication: Mobility status PT Visit Diagnosis: Difficulty in walking, not elsewhere classified (R26.2);Pain Pain - Right/Left: Right Pain - part of body: Hip     Time: 1100-1116 PT Time Calculation (min) (ACUTE ONLY): 16 min  Charges:  $Gait Training: 8-22 mins                      Rica Koyanagi  PTA Acute  Rehabilitation Services Pager      [phone] Office      [phone]

## 2019-10-18 NOTE — Evaluation (Signed)
Occupational Therapy Evaluation Patient Details Name: Lori [name] MRN: [id number] DOB: [birth year] Today's Date: 10/18/2019    History of Present Illness 79 y.o. female admitted 10/17/19 for R AA-THA. PMH includes spinal stenosis, pulmonary embolism, a fib, vertigo 2* brain injury.   Clinical Impression   Patient with functional deficits listed below impacting safety and independence with self care. Patient supervision level for functional transfer to toilet, peri care and LB dressing to don underwear. Patient utilizes her single point cane to assist with LB dressing "I've been doing this at home for quite some time." Recommend continued acute OT services for further transfer and ADL training as patient reports a friend will be staying with her initially, but typically lives alone.     Follow Up Recommendations  Follow surgeons recommendation for DC plan and follow-up therapies    Equipment Recommendations  None recommended by OT       Precautions / Restrictions Precautions Precautions: Fall Restrictions Weight Bearing Restrictions: Yes Other Position/Activity Restrictions: WBAT      Mobility Bed Mobility Overal bed mobility: Needs Assistance Bed Mobility: Supine to Sit     Supine to sit: Supervision     General bed mobility comments: increased time, no physical assistance  Transfers Overall transfer level: Needs assistance Equipment used: Rolling walker (2 wheeled) Transfers: Sit to/from Stand Sit to Stand: Supervision         General transfer comment: increased time in static stand due to burning sensation with weight bearing    Balance Overall balance assessment: Needs assistance Sitting-balance support: Feet supported Sitting balance-Leahy Scale: Good     Standing balance support: No upper extremity supported;During functional activity Standing balance-Leahy Scale: Fair Standing balance comment: patient able to stand without support of walker to pull up  underwear                           ADL either performed or assessed with clinical judgement   ADL Overall ADL's : Needs assistance/impaired     Grooming: Wash/dry hands;Supervision/safety;Standing   Upper Body Bathing: Set up;Sitting   Lower Body Bathing: Minimal assistance;Sitting/lateral leans Lower Body Bathing Details (indicate cue type and reason): to reach feet Upper Body Dressing : Set up;Sitting   Lower Body Dressing: Minimal assistance;Sit to/from stand;Sitting/lateral leans Lower Body Dressing Details (indicate cue type and reason): patient unable to doff socks, reports she does not wear them at home. with supervision patient uses SPC to don underwear  Toilet Transfer: Supervision/safety;Ambulation;Regular Toilet;RW Toilet Transfer Details (indicate cue type and reason): patient squats over toilet able to maintain balance with walker Toileting- Clothing Manipulation and Hygiene: Supervision/safety;Sit to/from stand       Functional mobility during ADLs: Supervision/safety;Rolling walker General ADL Comments: patient has developed compensatory strategies at home due to hx of hip pain, uses SPC as AE to assist with LB dressing                  Pertinent Vitals/Pain Pain Assessment: Faces Faces Pain Scale: Hurts even more Pain Location: R hip with walking Pain Descriptors / Indicators: Burning Pain Intervention(s): Monitored during session     Hand Dominance  (did not specify)   Extremity/Trunk Assessment Upper Extremity Assessment Upper Extremity Assessment: Overall WFL for tasks assessed           Communication Communication Communication: No difficulties   Cognition Arousal/Alertness: Awake/alert Behavior During Therapy: WFL for tasks assessed/performed Overall Cognitive Status: Within Functional Limits for tasks  assessed                                                Home Living Family/patient expects to be  discharged to:: Private residence Living Arrangements: Alone Available Help at Discharge: Friend(s);Available 24 hours/day Type of Home: House Home Access: Stairs to enter [employer] of Steps: 3 Entrance Stairs-Rails: Right;Left;Can reach both Home Layout: Two level;Able to live on main level with bedroom/bathroom     Bathroom Shower/Tub: Teacher, early years/pre: Standard     Home Equipment: Environmental consultant - 2 wheels;Cane - quad;Toilet riser;Cane - single point;Shower seat   Additional Comments: walked with quad cane and SPC PTA; drives      Prior Functioning/Environment Level of Independence: Independent with assistive device(s)                 OT Problem List: Decreased activity tolerance;Impaired balance (sitting and/or standing);Pain;Decreased safety awareness      OT Treatment/Interventions: Self-care/ADL training;DME and/or AE instruction;Balance training;Patient/family education    OT Goals(Current goals can be found in the care plan section) Acute Rehab OT Goals Patient Stated Goal: return to living independently OT Goal Formulation: With patient Time For Goal Achievement: 11/01/19 Potential to Achieve Goals: Good  OT Frequency: Min 2X/week    AM-PAC OT "6 Clicks" Daily Activity     Outcome Measure Help from another person eating meals?: None Help from another person taking care of personal grooming?: A Little Help from another person toileting, which includes using toliet, bedpan, or urinal?: A Little Help from another person bathing (including washing, rinsing, drying)?: A Little Help from another person to put on and taking off regular upper body clothing?: A Little Help from another person to put on and taking off regular lower body clothing?: A Little 6 Click Score: 19   End of Session Equipment Utilized During Treatment: Rolling walker Nurse Communication: Mobility status  Activity Tolerance: Patient tolerated treatment well Patient  left: in chair;with call bell/phone within reach;with chair alarm set  OT Visit Diagnosis: Pain;Other abnormalities of gait and mobility (R26.89) Pain - Right/Left: Right Pain - part of body: Hip                Time: 0459-9774 OT Time Calculation (min): 23 min Charges:  OT General Charges $OT Visit: 1 Visit OT Evaluation $OT Eval Low Complexity: 1 Low OT Treatments $Self Care/Home Management : 8-22 mins  Delbert Phenix OT OT pager: [place] 10/18/2019, 8:56 AM

## 2019-10-18 NOTE — Progress Notes (Signed)
Physical Therapy Treatment Patient Details Name: Lori [name] MRN: [id number] DOB: Jul 29, 1940 Today's Date: 10/18/2019    History of Present Illness 79 y.o. female admitted 10/17/19 for R AA-THA. PMH includes spinal stenosis, pulmonary embolism, a fib, vertigo 2* brain injury.    PT Comments    POD # 1 am session Pt OOB in recliner.  Assisted with amb in hallway, practiced stairs then returned to room to perform all standing TE's following HEP handout.  Pt will need another PT session to complete HEP and practice stairs one more time before D/C to home.   Follow Up Recommendations  Follow surgeon's recommendation for DC plan and follow-up therapies (HEP)     Equipment Recommendations  None recommended by PT    Recommendations for Other Services       Precautions / Restrictions Precautions Precautions: Fall Restrictions Weight Bearing Restrictions: No Other Position/Activity Restrictions: WBAT    Mobility  Bed Mobility               General bed mobility comments: OOB in recliner  Transfers Overall transfer level: Needs assistance Equipment used: Rolling walker (2 wheeled) Transfers: Sit to/from Stand Sit to Stand: Supervision         General transfer comment: increased time and good use of hands to steady self  Ambulation/Gait Ambulation/Gait assistance: Supervision;Min guard Gait Distance (Feet): 50 Feet Assistive device: Rolling walker (2 wheeled) Gait Pattern/deviations: Step-to pattern;Decreased step length - right;Decreased step length - left;Decreased stance time - right Gait velocity: decr   General Gait Details: <25% VCs sequencing, no loss of balance, tolerated an increased distance   Stairs Stairs: Yes Stairs assistance: Min guard;Min assist Stair Management: Two rails;Step to pattern;Forwards Number of Stairs: 2 General stair comments: 50% VC's on proper sequencing and safety   Wheelchair Mobility    Modified Rankin (Stroke Patients  Only)       Balance                                            Cognition Arousal/Alertness: Awake/alert Behavior During Therapy: WFL for tasks assessed/performed Overall Cognitive Status: Within Functional Limits for tasks assessed                                 General Comments: AxO x 3 motivated      Exercises  10 reps all standing TE's following HEP handout    General Comments        Pertinent Vitals/Pain Pain Assessment: 0-10 Pain Score: 7  Pain Location: R hip with walking Pain Descriptors / Indicators: Burning Pain Intervention(s): Monitored during session;Premedicated before session;Repositioned;Ice applied    Home Living                      Prior Function            PT Goals (current goals can now be found in the care plan section) Progress towards PT goals: Progressing toward goals    Frequency    7X/week      PT Plan Current plan remains appropriate    Co-evaluation              AM-PAC PT "6 Clicks" Mobility   Outcome Measure  Help needed turning from your back to your side while in a flat  bed without using bedrails?: None Help needed moving from lying on your back to sitting on the side of a flat bed without using bedrails?: None Help needed moving to and from a bed to a chair (including a wheelchair)?: A Little Help needed standing up from a chair using your arms (e.g., wheelchair or bedside chair)?: A Little Help needed to walk in hospital room?: A Little Help needed climbing 3-5 steps with a railing? : A Little 6 Click Score: 20    End of Session Equipment Utilized During Treatment: Gait belt Activity Tolerance: Patient tolerated treatment well;Patient limited by pain Patient left: in chair;with call bell/phone within reach;with chair alarm set Nurse Communication: Mobility status PT Visit Diagnosis: Difficulty in walking, not elsewhere classified (R26.2);Pain Pain - Right/Left:  Right Pain - part of body: Hip     Time: 0920-0945 PT Time Calculation (min) (ACUTE ONLY): 25 min  Charges:  $Gait Training: 8-22 mins $Therapeutic Exercise: 8-22 mins                     Rica Koyanagi  PTA Acute  Rehabilitation Services Pager      [phone] Office      [phone]

## 2019-10-18 NOTE — Discharge Summary (Signed)
Discharge Summary  Patient ID: Lori [name] MRN: [id number] DOB/AGE: 1940-03-21 79 y.o.  Admit date: 10/17/2019 Discharge date: 10/18/2019  Admission Diagnoses:  Primary localized osteoarthritis of right hip  Discharge Diagnoses:  Principal Problem:   Primary localized osteoarthritis of right hip Active Problems:   Essential hypertension   Hyperlipidemia, mixed   Obesity (BMI 30.0-34.9)   Atrial fibrillation ([place])   History of pulmonary embolism (01/2018)   Gastroesophageal reflux disease with esophagitis   Iron deficiency anemia due to chronic blood loss   Spinal stenosis of lumbar region   Primary localized osteoarthrosis of the hip, right   Past Medical History:  Diagnosis Date  . Anemia   . Arthritis   . Benign neoplasm of sigmoid colon   . Deafness    left ear per pt   . Dyspnea    pt reports with exertion due to hip and knees   . Dysrhythmia    afib   . History of bleeding ulcers    2019 received 3-4 units of blood   . Hyperlipidemia   . Hypertension   . Pericardial effusion 02/04/2018  . Pre-diabetes    per Dr Einar Gip note of 09/28/19 pt denies at preop visit on 10/06/19   . Prediabetes   . Primary localized osteoarthritis of right hip 09/26/2019  . Pulmonary embolism ([place])    hx of 2019   . Vertigo   . Vitamin D deficiency     Surgeries: Procedure(s): TOTAL HIP ARTHROPLASTY ANTERIOR APPROACH on 10/17/2019   Consultants (if any):   Discharged Condition: Improved  Hospital Course: Lori [name] is an 79 y.o. female who was admitted 10/17/2019 with a diagnosis of Primary localized osteoarthritis of right hip and went to the operating room on 10/17/2019 and underwent the above named procedures.     She was given perioperative antibiotics:  Anti-infectives (From admission, onward)   Start     Dose/Rate Route Frequency Ordered Stop   10/17/19 0749  ceFAZolin (ANCEF) 2-4 GM/100ML-% IVPB       Note to Pharmacy: Randa Evens  : cabinet override      10/17/19  0749 10/17/19 1048   10/17/19 0745  ceFAZolin (ANCEF) IVPB 2g/100 mL premix        2 g 200 mL/hr over 30 Minutes Intravenous On call to O.R. 10/17/19 9323 10/17/19 1045    .  She was given sequential compression devices, early ambulation, and Eliquis  for DVT prophylaxis.  She benefited maximally from the hospital stay and there were no complications.    Recent vital signs:  Vitals:   10/18/19 0152 10/18/19 0533  BP: 128/72 (!) 159/82  Pulse: 75 69  Resp: 16 16  Temp: 98.2 F (36.8 C) 97.7 F (36.5 C)  SpO2: 97% 98%    Recent laboratory studies:  Lab Results  Component Value Date   HGB 13.5 10/02/2019   HGB 13.6 08/21/2019   HGB 12.6 06/28/2019   Lab Results  Component Value Date   WBC 5.8 10/02/2019   PLT 217 10/02/2019   Lab Results  Component Value Date   INR 1.0 10/06/2019   Lab Results  Component Value Date   NA 146 10/02/2019   K 4.0 10/02/2019   CL 110 10/02/2019   CO2 28 10/02/2019   BUN 19 10/02/2019   CREATININE 0.99 (H) 10/02/2019   GLUCOSE 108 (H) 10/02/2019    Discharge Medications:   Allergies as of 10/18/2019   No Known Allergies  Medication List    STOP taking these medications   aspirin EC 81 MG tablet     TAKE these medications   acetaminophen 500 MG tablet Commonly known as: TYLENOL Take 1,000 mg by mouth every 6 (six) hours as needed for mild pain or moderate pain. What changed: Another medication with the same name was added. Make sure you understand how and when to take each.   acetaminophen 325 MG tablet Commonly known as: TYLENOL Take 1-2 tablets (325-650 mg total) by mouth every 6 (six) hours as needed for mild pain (pain score 1-3 or temp > 100.5). What changed: You were already taking a medication with the same name, and this prescription was added. Make sure you understand how and when to take each.   apixaban 5 MG Tabs tablet Commonly known as: Eliquis Take 1 tablet (5 mg total) by mouth 2 (two) times daily. Start  Post hip replacement. Stop Aspirin after starting Eliquis   baclofen 10 MG tablet Commonly known as: LIORESAL Take 1 tablet (10 mg total) by mouth 2 (two) times daily.   Cholecalciferol 25 MCG (1000 UT) capsule Take 1,000 Units by mouth daily.   diazepam 5 MG tablet Commonly known as: VALIUM Take 1/2 to 1 tablet 2 to 3 x /day for Dizziness / Vertigo What changed:   how much to take  how to take this  when to take this  reasons to take this  additional instructions   ferrous sulfate 325 (65 FE) MG EC tablet Take 325 mg by mouth 3 (three) times a week.   HYDROcodone-acetaminophen 5-325 MG tablet Commonly known as: NORCO/VICODIN Take 1 tablet by mouth every 4 (four) hours as needed for up to 5 days for moderate pain.   HYDROcodone-acetaminophen 5-325 MG tablet Commonly known as: NORCO/VICODIN Take 1-2 tablets by mouth every 4 (four) hours as needed for moderate pain (pain score 4-6).   metoprolol tartrate 25 MG tablet Commonly known as: LOPRESSOR Take 1 tablet Daily for BP   ondansetron 4 MG tablet Commonly known as: Zofran Take 1 tablet (4 mg total) by mouth 2 (two) times daily for 7 days.   ondansetron 4 MG tablet Commonly known as: ZOFRAN Take 1 tablet (4 mg total) by mouth every 8 (eight) hours as needed for nausea or vomiting.   pantoprazole 40 MG tablet Commonly known as: PROTONIX Take 1 tablet Daily for Heartburn & Indigestion       Diagnostic Studies: DG C-Arm 1-60 Min-No Report  Result Date: 10/17/2019 Fluoroscopy was utilized by the requesting physician.  No radiographic interpretation.   DG HIP OPERATIVE UNILAT W OR W/O PELVIS RIGHT  Result Date: 10/17/2019 CLINICAL DATA:  Right hip arthroplasty anterior approach EXAM: OPERATIVE right HIP (WITH PELVIS IF PERFORMED) 2  VIEWS TECHNIQUE: Fluoroscopic spot image(s) were submitted for interpretation post-operatively. COMPARISON:  pelvis 08/04/2019 FINDINGS: Right hip replacement in satisfactory position  and alignment. No fracture or complication IMPRESSION: Satisfactory right hip replacement Electronically Signed   By: Franchot Gallo M.D.   On: 10/17/2019 12:58    Disposition: Discharge disposition: 01-Home or Self Care       Discharge Instructions    Call MD / Call 911   Complete by: As directed    If you experience chest pain or shortness of breath, CALL 911 and be transported to the hospital emergency room.  If you develope a fever above 101 F, pus (white drainage) or increased drainage or redness at the wound, or calf pain, call  your surgeon's office.   Constipation Prevention   Complete by: As directed    Drink plenty of fluids.  Prune juice may be helpful.  You may use a stool softener, such as Colace (over the counter) 100 mg twice a day.  Use MiraLax (over the counter) for constipation as needed.   Diet - low sodium heart healthy   Complete by: As directed    Discharge instructions   Complete by: As directed    INSTRUCTIONS AFTER JOINT REPLACEMENT   Remove items at home which could result in a fall. This includes throw rugs or furniture in walking pathways ICE to the affected joint every three hours while awake for 30 minutes at a time, for at least the first 3-5 days, and then as needed for pain and swelling.  Continue to use ice for pain and swelling. You may notice swelling that will progress down to the foot and ankle.  This is normal after surgery.  Elevate your leg when you are not up walking on it.   Continue to use the breathing machine you got in the hospital (incentive spirometer) which will help keep your temperature down.  It is common for your temperature to cycle up and down following surgery, especially at night when you are not up moving around and exerting yourself.  The breathing machine keeps your lungs expanded and your temperature down.   DIET:  As you were doing prior to hospitalization, we recommend a well-balanced diet.  DRESSING / WOUND CARE /  SHOWERING  You may shower 3 days after surgery, but keep the wounds dry during showering.  You may use an occlusive plastic wrap (Press'n Seal for example), NO SOAKING/SUBMERGING IN THE BATHTUB.  If the bandage gets wet, change with a clean dry gauze.  If the incision gets wet, pat the wound dry with a clean towel.  ACTIVITY  Increase activity slowly as tolerated, but follow the weight bearing instructions below.   No driving for 6 weeks or until further direction given by your physician.  You cannot drive while taking narcotics.  No lifting or carrying greater than 10 lbs. until further directed by your surgeon. Avoid periods of inactivity such as sitting longer than an hour when not asleep. This helps prevent blood clots.  You may return to work once you are authorized by your doctor.     WEIGHT BEARING   Weight bearing as tolerated with assist device (walker, cane, etc) as directed, use it as long as suggested by your surgeon or therapist, typically at least 4-6 weeks.   EXERCISES  Results after joint replacement surgery are often greatly improved when you follow the exercise, range of motion and muscle strengthening exercises prescribed by your doctor. Safety measures are also important to protect the joint from further injury. Any time any of these exercises cause you to have increased pain or swelling, decrease what you are doing until you are comfortable again and then slowly increase them. If you have problems or questions, call your caregiver or physical therapist for advice.   Rehabilitation is important following a joint replacement. After just a few days of immobilization, the muscles of the leg can become weakened and shrink (atrophy).  These exercises are designed to build up the tone and strength of the thigh and leg muscles and to improve motion. Often times heat used for twenty to thirty minutes before working out will loosen up your tissues and help with improving the range  of motion  but do not use heat for the first two weeks following surgery (sometimes heat can increase post-operative swelling).   These exercises can be done on a training (exercise) mat, on the floor, on a table or on a bed. Use whatever works the best and is most comfortable for you.    Use music or television while you are exercising so that the exercises are a pleasant break in your day. This will make your life better with the exercises acting as a break in your routine that you can look forward to.   Perform all exercises about fifteen times, three times per day or as directed.  You should exercise both the operative leg and the other leg as well.  Exercises include:   Quad Sets - Tighten up the muscle on the front of the thigh (Quad) and hold for 5-10 seconds.   Straight Leg Raises - With your knee straight (if you were given a brace, keep it on), lift the leg to 60 degrees, hold for 3 seconds, and slowly lower the leg.  Perform this exercise against resistance later as your leg gets stronger.  Leg Slides: Lying on your back, slowly slide your foot toward your buttocks, bending your knee up off the floor (only go as far as is comfortable). Then slowly slide your foot back down until your leg is flat on the floor again.  Angel Wings: Lying on your back spread your legs to the side as far apart as you can without causing discomfort.  Hamstring Strength:  Lying on your back, push your heel against the floor with your leg straight by tightening up the muscles of your buttocks.  Repeat, but this time bend your knee to a comfortable angle, and push your heel against the floor.  You may put a pillow under the heel to make it more comfortable if necessary.   A rehabilitation program following joint replacement surgery can speed recovery and prevent re-injury in the future due to weakened muscles. Contact your doctor or a physical therapist for more information on knee rehabilitation.     CONSTIPATION  Constipation is defined medically as fewer than three stools per week and severe constipation as less than one stool per week.  Even if you have a regular bowel pattern at home, your normal regimen is likely to be disrupted due to multiple reasons following surgery.  Combination of anesthesia, postoperative narcotics, change in appetite and fluid intake all can affect your bowels.   YOU MUST use at least one of the following options; they are listed in order of increasing strength to get the job done.  They are all available over the counter, and you may need to use some, POSSIBLY even all of these options:    Drink plenty of fluids (prune juice may be helpful) and high fiber foods Colace 100 mg by mouth twice a day  Senokot for constipation as directed and as needed Dulcolax (bisacodyl), take with full glass of water  Miralax (polyethylene glycol) once or twice a day as needed.  If you have tried all these things and are unable to have a bowel movement in the first 3-4 days after surgery call either your surgeon or your primary doctor.    If you experience loose stools or diarrhea, hold the medications until you stool forms back up.  If your symptoms do not get better within 1 week or if they get worse, check with your doctor.  If you experience "the worst abdominal  pain ever" or develop nausea or vomiting, please contact the office immediately for further recommendations for treatment.   ITCHING:  If you experience itching with your medications, try taking only a single pain pill, or even half a pain pill at a time.  You can also use Benadryl over the counter for itching or also to help with sleep.   TED HOSE STOCKINGS:  Use stockings on both legs until for at least 2 weeks or as directed by physician office. They may be removed at night for sleeping.  MEDICATIONS:  See your medication summary on the "After Visit Summary" that nursing will review with you.  You may have some  home medications which will be placed on hold until you complete the course of blood thinner medication.  It is important for you to complete the blood thinner medication as prescribed.  PRECAUTIONS:  If you experience chest pain or shortness of breath - call 911 immediately for transfer to the hospital emergency department.   If you develop a fever greater that 101 F, purulent drainage from wound, increased redness or drainage from wound, foul odor from the wound/dressing, or calf pain - CONTACT YOUR SURGEON.                                                   FOLLOW-UP APPOINTMENTS:  If you do not already have a post-op appointment, please call the office for an appointment to be seen by your surgeon.  Guidelines for how soon to be seen are listed in your "After Visit Summary", but are typically between 1-4 weeks after surgery.  OTHER INSTRUCTIONS:   Knee Replacement:  Do not place pillow under knee, focus on keeping the knee straight while resting. CPM instructions: 0-90 degrees, 2 hours in the morning, 2 hours in the afternoon, and 2 hours in the evening. Place foam block, curve side up under heel at all times except when in CPM or when walking.  DO NOT modify, tear, cut, or change the foam block in any way.   DENTAL ANTIBIOTICS:  In most cases prophylactic antibiotics for Dental procdeures after total joint surgery are not necessary.  Exceptions are as follows:  1. History of prior total joint infection  2. Severely immunocompromised (Organ Transplant, cancer chemotherapy, Rheumatoid biologic meds such as [place])  3. Poorly controlled diabetes (A1C > 8.0, blood glucose over 200)  If you have one of these conditions, contact your surgeon for an antibiotic prescription, prior to your dental procedure.   MAKE SURE YOU:  Understand these instructions.  Get help right away if you are not doing well or get worse.    Thank you for letting us be a part of your medical care team.  It is  a privilege we respect greatly.  We hope these instructions will help you stay on track for a fast and full recovery!   Driving restrictions   Complete by: As directed    No driving for 6 weeks   Increase activity slowly as tolerated   Complete by: As directed    Lifting restrictions   Complete by: As directed    No lifting over 10 lbs for 6 weeks   TED hose   Complete by: As directed    Use stockings (TED hose) for 6 weeks on both leg(s).  You may remove them  at night for sleeping.       Follow-up Information    Renette Butters, MD In 2 weeks.   Specialty: Orthopedic Surgery Contact information: [address] [place] [address] [phone]                Signed: Rachael Fee PA-C 10/18/2019, 7:35 AM

## 2019-10-18 NOTE — Progress Notes (Signed)
    Subjective: Patient reports pain as mild.  Tolerating diet.  Urinating.  +Flatus.  No CP, SOB.  paraesthesia  OOB with PT.  Objective:   VITALS:   Vitals:   10/17/19 1858 10/17/19 2029 10/18/19 0152 10/18/19 0533  BP: 135/72 (!) 156/65 128/72 (!) 159/82  Pulse: 78 80 75 69  Resp: 16 15 16 16   Temp: 97.7 F (36.5 C) 98.1 F (36.7 C) 98.2 F (36.8 C) 97.7 F (36.5 C)  TempSrc: Oral Oral Oral Oral  SpO2: 97% 98% 97% 98%  Weight:      Height:       CBC Latest Ref Rng & Units 10/02/2019 08/21/2019 06/28/2019  WBC 3.8 - 10.8 Thousand/uL 5.8 7.7 8.3  Hemoglobin 11.7 - 15.5 g/dL 13.5 13.6 12.6  Hematocrit 35 - 45 % 41.0 42.9 40.0  Platelets 140 - 400 Thousand/uL 217 303 432(H)   BMP Latest Ref Rng & Units 10/02/2019 08/21/2019 06/28/2019  Glucose 65 - 99 mg/dL 108(H) 169(H) 59(L)  BUN 7 - 25 mg/dL 19 24 32(H)  Creatinine 0.60 - 0.93 mg/dL 0.99(H) 1.07(H) 1.85(H)  BUN/Creat Ratio 6 - 22 (calc) 19 22 17   Sodium 135 - 146 mmol/L 146 142 140  Potassium 3.5 - 5.3 mmol/L 4.0 4.5 4.5  Chloride 98 - 110 mmol/L 110 107 100  CO2 20 - 32 mmol/L 28 28 28   Calcium 8.6 - 10.4 mg/dL 9.7 9.8 9.7   Intake/Output      08/03 0701 - 08/04 0700 08/04 0701 - 08/05 0700   P.O. 840    I.V. (mL/kg) 2598.3 (36.2)    IV Piggyback 350    Total Intake(mL/kg) 3788.3 (52.8)    Urine (mL/kg/hr) 1800    Blood 100    Total Output 1900    Net +1888.3            Physical Exam: General: NAD.  AAO x4, Resting in bed Resp: No increased wob Cardio: regular rate and rhythm ABD soft Neurologically intact MSK Neurovascularly intact Sensation intact distally Intact pulses distally Dorsiflexion/Plantar flexion intact Incision: dressing C/D/I Neurologically intact Intact pulses distally Dorsiflexion/Plantar flexion intact Incision: no drainage Compartment soft  Assessment: 1 Day Post-Op  S/P Procedure(s) (LRB): TOTAL HIP ARTHROPLASTY ANTERIOR APPROACH (Right) by Dr. Ernesta Amble. Murphy on  10/17/2019  Principal Problem:   Primary localized osteoarthritis of right hip Active Problems:   Essential hypertension   Hyperlipidemia, mixed   Obesity (BMI 30.0-34.9)   Atrial fibrillation ([place])   History of pulmonary embolism (01/2018)   Gastroesophageal reflux disease with esophagitis   Iron deficiency anemia due to chronic blood loss   Spinal stenosis of lumbar region   Primary localized osteoarthrosis of the hip, right     Plan:   Discharge home with home health Incentive Spirometry Elevate and Apply ice  Weightbearing: WBAT RLE Insicional and dressing care: Dressings left intact until follow-up Orthopedic device(s): None Showering: Keep dressing dry VTE prophylaxis: Start back on her Eliquis, SCDs, ambulation Pain control: Norco, Tylenol, Celebrex Follow - up plan: 2 weeks Contact information:  Edmonia Lynch MD, Roxan Hockey PA-C  Dispo: Home today  Rachael Fee, PA-C 10/18/2019, 7:28 AM

## 2019-10-20 DIAGNOSIS — Z7901 Long term (current) use of anticoagulants: Secondary | ICD-10-CM | POA: Diagnosis not present

## 2019-10-20 DIAGNOSIS — E669 Obesity, unspecified: Secondary | ICD-10-CM | POA: Diagnosis not present

## 2019-10-20 DIAGNOSIS — D500 Iron deficiency anemia secondary to blood loss (chronic): Secondary | ICD-10-CM | POA: Diagnosis not present

## 2019-10-20 DIAGNOSIS — Z96641 Presence of right artificial hip joint: Secondary | ICD-10-CM | POA: Diagnosis not present

## 2019-10-20 DIAGNOSIS — E782 Mixed hyperlipidemia: Secondary | ICD-10-CM | POA: Diagnosis not present

## 2019-10-20 DIAGNOSIS — J45909 Unspecified asthma, uncomplicated: Secondary | ICD-10-CM | POA: Diagnosis not present

## 2019-10-20 DIAGNOSIS — Z86711 Personal history of pulmonary embolism: Secondary | ICD-10-CM | POA: Diagnosis not present

## 2019-10-20 DIAGNOSIS — Z87891 Personal history of nicotine dependence: Secondary | ICD-10-CM | POA: Diagnosis not present

## 2019-10-20 DIAGNOSIS — M48061 Spinal stenosis, lumbar region without neurogenic claudication: Secondary | ICD-10-CM | POA: Diagnosis not present

## 2019-10-20 DIAGNOSIS — M19041 Primary osteoarthritis, right hand: Secondary | ICD-10-CM | POA: Diagnosis not present

## 2019-10-20 DIAGNOSIS — I4891 Unspecified atrial fibrillation: Secondary | ICD-10-CM | POA: Diagnosis not present

## 2019-10-20 DIAGNOSIS — Z86010 Personal history of colonic polyps: Secondary | ICD-10-CM | POA: Diagnosis not present

## 2019-10-20 DIAGNOSIS — Z6825 Body mass index (BMI) 25.0-25.9, adult: Secondary | ICD-10-CM | POA: Diagnosis not present

## 2019-10-20 DIAGNOSIS — K2100 Gastro-esophageal reflux disease with esophagitis, without bleeding: Secondary | ICD-10-CM | POA: Diagnosis not present

## 2019-10-20 DIAGNOSIS — H9190 Unspecified hearing loss, unspecified ear: Secondary | ICD-10-CM | POA: Diagnosis not present

## 2019-10-20 DIAGNOSIS — I10 Essential (primary) hypertension: Secondary | ICD-10-CM | POA: Diagnosis not present

## 2019-10-20 DIAGNOSIS — E559 Vitamin D deficiency, unspecified: Secondary | ICD-10-CM | POA: Diagnosis not present

## 2019-10-20 DIAGNOSIS — Z471 Aftercare following joint replacement surgery: Secondary | ICD-10-CM | POA: Diagnosis not present

## 2019-10-20 DIAGNOSIS — M19042 Primary osteoarthritis, left hand: Secondary | ICD-10-CM | POA: Diagnosis not present

## 2019-10-20 DIAGNOSIS — G43909 Migraine, unspecified, not intractable, without status migrainosus: Secondary | ICD-10-CM | POA: Diagnosis not present

## 2019-10-23 ENCOUNTER — Other Ambulatory Visit: Payer: Self-pay | Admitting: *Deleted

## 2019-10-23 ENCOUNTER — Telehealth: Payer: Self-pay | Admitting: *Deleted

## 2019-10-23 DIAGNOSIS — I480 Paroxysmal atrial fibrillation: Secondary | ICD-10-CM

## 2019-10-23 MED ORDER — APIXABAN 5 MG PO TABS: 5.0000 mg | ORAL_TABLET | Freq: Two times a day (BID) | ORAL | 3 refills | Status: DC

## 2019-10-23 NOTE — Telephone Encounter (Signed)
Called patient on 10/23/2019 , 5:01 PM in an attempt to reach the patient for a hospital follow up.   Admit date: 10/17/19 Discharge: 10/18/19   She does not have any questions or concerns about medications from the hospital admission. The patient's medications were reviewed over the phone, they were counseled to bring in all current medications to the hospital follow up visit.   I advised the patient to call if any questions or concerns arise about the hospital admission or medications    Home health was started in the hospital. Patient is having PT in her home 3 times this week and 1 time next week. All questions were answered and a follow up appointment was made. Appointment was made with Garnet Sierras, NP, on 10/31/2019.  Prior to Admission medications   Medication Sig Start Date End Date Taking? Authorizing Provider  acetaminophen (TYLENOL) 325 MG tablet Take 1-2 tablets (325-650 mg total) by mouth every 6 (six) hours as needed for mild pain (pain score 1-3 or temp > 100.5). 10/18/19   Rachael Fee, PA-C  acetaminophen (TYLENOL) 500 MG tablet Take 1,000 mg by mouth every 6 (six) hours as needed for mild pain or moderate pain.    [provider]  apixaban (ELIQUIS) 5 MG TABS tablet Take 1 tablet (5 mg total) by mouth 2 (two) times daily. Start Post hip replacement. Stop Aspirin after starting Eliquis 09/04/19   Adrian Prows, MD  baclofen (LIORESAL) 10 MG tablet Take 1 tablet (10 mg total) by mouth 2 (two) times daily. 10/17/19 10/16/20  Rachael Fee, PA-C  Cholecalciferol 25 MCG (1000 UT) capsule Take 1,000 Units by mouth daily.     [provider]  diazepam (VALIUM) 5 MG tablet Take 1/2 to 1 tablet 2 to 3 x /day for Dizziness / Vertigo Patient taking differently: Take 5 mg by mouth daily as needed (Vertigo). for Dizziness / Vertigo 10/10/18   Unk Pinto, MD  ferrous sulfate 325 (65 FE) MG EC tablet Take 325 mg by mouth 3 (three) times a week.     [provider]   HYDROcodone-acetaminophen (NORCO/VICODIN) 5-325 MG tablet Take 1-2 tablets by mouth every 4 (four) hours as needed for moderate pain (pain score 4-6). 10/18/19   Rachael Fee, PA-C  metoprolol tartrate (LOPRESSOR) 25 MG tablet Take 1 tablet Daily for BP 10/06/19   Unk Pinto, MD  ondansetron (ZOFRAN) 4 MG tablet Take 1 tablet (4 mg total) by mouth 2 (two) times daily for 7 days. 10/17/19 10/24/19  Rachael Fee, PA-C  ondansetron (ZOFRAN) 4 MG tablet Take 1 tablet (4 mg total) by mouth every 8 (eight) hours as needed for nausea or vomiting. 10/18/19   Rachael Fee, PA-C  pantoprazole (PROTONIX) 40 MG tablet Take 1 tablet Daily for Heartburn & Indigestion 10/06/19   Unk Pinto, MD

## 2019-10-23 NOTE — Telephone Encounter (Signed)
I left a message for the patient to return my call.

## 2019-10-23 NOTE — Progress Notes (Signed)
Hospital follow up [place]  Virtual Visit via Telephone Note  I connected with Lori [name] on 10/31/19 at 11:00 AM EDT by telephone and verified that I am speaking with the correct person using two identifiers.   I discussed the limitations, risks, security and privacy concerns of performing an evaluation and management service by telephone and the availability of in person appointments. I also discussed with the patient that there may be a patient responsible charge related to this service. The patient expressed understanding and agreed to proceed.  Assessment and Plan: Hospital visit follow up for THR-R  Lori [name] was seen today for hospitalization follow-up.  Diagnoses and all orders for this visit:  Primary localized osteoarthritis of right hip Status post total replacement of right hip TRH-R  Follow up with Ortho in one day Continue D/C recommendations Continue activities and exercises as previously advised. Monitor dressing Discussed safety and driving Discussed safety regarding fall risk animals in the home. Continue Tylenol for pain management, doing well with this  Paroxysmal atrial fibrillation ([name]) Thrombophilia ([place]) No concerns with excessive bleeding  No falls Continue medication: xarelto Discussed hospital precautions with patient Will continue to monitor   Constipation Increase vegetables in diet Increase water intake Improved with lifestyle NO longer using pain medications    All medications were reviewed with patient and family and fully reconciled. All questions answered fully, and patient and family members were encouraged to call the office with any further questions or concerns. Discussed goal to avoid readmission related to this diagnosis.  There are no discontinued medications.  Over 40 minutes of non-face-to-face interview, exam, counseling, chart review, and complex, high/moderate level critical decision making was performed this visit.    Future Appointments  Date Time Provider [place]  01/02/2020  2:30 PM Unk Pinto, MD [facility] None  07/02/2020  2:00 PM Unk Pinto, MD [facility] None  09/24/2020 11:30 AM [facility] 1 [facility] None  10/03/2020  1:00 PM Adrian Prows, MD [facility] None     HPI 79 y.o.female presents for follow up for transition from recent hospitalization. Admit date to the hospital was 10/17/19, patient was discharged from the hospital on 10/18/19 and our clinical staff contacted the office the day after discharge to set up a follow up appointment. The discharge summary, medications, and diagnostic test results were reviewed before meeting with the patient. The patient was admitted for: Primary localized osteoarthritis of right hip with Total hip arthroplasty, anterior approach.  She had pre-op antibiotics.  She is taking Eliquis for DVT prophalaxis, also Afib.Marland Kitchen  Her bASA is held at this time. Reports that she was to have PT, 4 sessions.  One week ago Monday 10/23/19 she was to have her first appointment between 1-2pm.  She said she has been at home and she even waited around until after 3pm and no one showed up.  She then received a phone call the next day letting her know that since she wasn't home for her appointment yesterday they did not have an appointment for her to do the therapy.  She reports she was not happy about this but has been moving around her home with ease.  She takes Tylenol in the morning and at night.  She has been sleeping well and through the night.  Reports that they recommended compression stockings but she is not able to get them on or take them off related to arthritis.  She reports that she does not have any pain and it not taking any  pain medication.  She reports some mild burning with the initiation of walking and it resolves after starting to walk around.  She is doing the exercises from the hospital daily and she walks around the home without any assistive devices.  She  does use a cane when she leaves the home for stability. Reports some constipation after returning home but has since had regular bowel movements.  She is drinking about 3-4 glasses of water a day.  She reports she is has been keeping the bandage on her right hip dry.  She has been washing up.   Follow up appointment tomorrow with [facility].  No Driving for 6 weeks.  Home health is involved, PT with a total of four visits.  Images while in the hospital: No results found.   Follow Up Instructions:  Discussed assessment and treatment plan with the patient. The patient was provided an opportunity to ask questions and all were answered. The patient agrees with the plan of care and demonstrates an understanding of the instructions.   The patient was advised to call back or seek an in-person evaluation if the symptoms worsen or if the condition fails to improve as anticipated.  I provided 20 minutes of non-face-to-face time during this encounter including interview, counseling, chart review, and critical decision making was preformed.   Future Appointments  Date Time Provider [place]  01/02/2020  2:30 PM Unk Pinto, MD [facility] None  07/02/2020  2:00 PM Unk Pinto, MD [facility] None  09/24/2020 11:30 AM [facility]/VAS 1 [facility] None  10/03/2020  1:00 PM Adrian Prows, MD [facility] None      Current Outpatient Medications (Cardiovascular):    metoprolol tartrate (LOPRESSOR) 25 MG tablet, Take 1 tablet Daily for BP   Current Outpatient Medications (Analgesics):    acetaminophen (TYLENOL) 325 MG tablet, Take 1-2 tablets (325-650 mg total) by mouth every 6 (six) hours as needed for mild pain (pain score 1-3 or temp > 100.5).   acetaminophen (TYLENOL) 500 MG tablet, Take 1,000 mg by mouth every 6 (six) hours as needed for mild pain or moderate pain.   HYDROcodone-acetaminophen (NORCO/VICODIN) 5-325 MG tablet, Take 1-2 tablets by mouth every 4 (four) hours as needed for  moderate pain (pain score 4-6).  Current Outpatient Medications (Hematological):    apixaban (ELIQUIS) 5 MG TABS tablet, Take 1 tablet (5 mg total) by mouth 2 (two) times daily. Start Post hip replacement. Stop Aspirin after starting Eliquis   ferrous sulfate 325 (65 FE) MG EC tablet, Take 325 mg by mouth 3 (three) times a week.   Current Outpatient Medications (Other):    baclofen (LIORESAL) 10 MG tablet, Take 1 tablet (10 mg total) by mouth 2 (two) times daily.   Cholecalciferol 25 MCG (1000 UT) capsule, Take 1,000 Units by mouth daily.    diazepam (VALIUM) 5 MG tablet, Take 1/2 to 1 tablet 2 to 3 x /day for Dizziness / Vertigo (Patient taking differently: Take 5 mg by mouth daily as needed (Vertigo). for Dizziness / Vertigo)   ondansetron (ZOFRAN) 4 MG tablet, Take 1 tablet (4 mg total) by mouth every 8 (eight) hours as needed for nausea or vomiting.   pantoprazole (PROTONIX) 40 MG tablet, Take 1 tablet Daily for Heartburn & Indigestion  Past Medical History:  Diagnosis Date   Anemia    Arthritis    Benign neoplasm of sigmoid colon    Deafness    left ear per pt    Dyspnea    pt reports  with exertion due to hip and knees    Dysrhythmia    afib    History of bleeding ulcers    2019 received 3-4 units of blood    Hyperlipidemia    Hypertension    Pericardial effusion 02/04/2018   Pre-diabetes    per Dr Einar Gip note of 09/28/19 pt denies at preop visit on 10/06/19    Prediabetes    Primary localized osteoarthritis of right hip 09/26/2019   Pulmonary embolism ([place])    hx of 2019    Vertigo    Vitamin D deficiency      No Known Allergies  ROS: all negative except above.   Physical Exam: There were no vitals filed for this visit. There were no vitals taken for this visit.  Patient does not have equipment to obtain this.  General : Well sounding patient in no apparent distress HEENT: no hoarseness, no cough for duration of visit Lungs: speaks in complete  sentences, no audible wheezing, no apparent distress Neurological: alert, oriented x 3 Psychiatric: pleasant, judgement appropriate     Garnet Sierras, NP 11:56 AM [facility]

## 2019-10-31 ENCOUNTER — Other Ambulatory Visit: Payer: Self-pay

## 2019-10-31 ENCOUNTER — Encounter: Payer: Self-pay | Admitting: Adult Health Nurse Practitioner

## 2019-10-31 ENCOUNTER — Ambulatory Visit (INDEPENDENT_AMBULATORY_CARE_PROVIDER_SITE_OTHER): Payer: PPO | Admitting: Adult Health Nurse Practitioner

## 2019-10-31 DIAGNOSIS — K5903 Drug induced constipation: Secondary | ICD-10-CM

## 2019-10-31 DIAGNOSIS — Z96641 Presence of right artificial hip joint: Secondary | ICD-10-CM | POA: Diagnosis not present

## 2019-10-31 DIAGNOSIS — I480 Paroxysmal atrial fibrillation: Secondary | ICD-10-CM

## 2019-10-31 DIAGNOSIS — M1611 Unilateral primary osteoarthritis, right hip: Secondary | ICD-10-CM | POA: Diagnosis not present

## 2019-10-31 DIAGNOSIS — D6859 Other primary thrombophilia: Secondary | ICD-10-CM

## 2019-11-01 DIAGNOSIS — M1611 Unilateral primary osteoarthritis, right hip: Secondary | ICD-10-CM | POA: Diagnosis not present

## 2019-11-28 ENCOUNTER — Other Ambulatory Visit: Payer: Self-pay | Admitting: Internal Medicine

## 2019-11-28 DIAGNOSIS — R42 Dizziness and giddiness: Secondary | ICD-10-CM

## 2019-11-28 MED ORDER — DIAZEPAM 5 MG PO TABS: ORAL_TABLET | ORAL | 0 refills | Status: DC

## 2019-12-04 DIAGNOSIS — M1611 Unilateral primary osteoarthritis, right hip: Secondary | ICD-10-CM | POA: Diagnosis not present

## 2020-01-01 DIAGNOSIS — M1611 Unilateral primary osteoarthritis, right hip: Secondary | ICD-10-CM | POA: Diagnosis not present

## 2020-01-02 ENCOUNTER — Other Ambulatory Visit: Payer: Self-pay

## 2020-01-02 ENCOUNTER — Encounter: Payer: Self-pay | Admitting: Internal Medicine

## 2020-01-02 ENCOUNTER — Ambulatory Visit (INDEPENDENT_AMBULATORY_CARE_PROVIDER_SITE_OTHER): Payer: PPO | Admitting: Internal Medicine

## 2020-01-02 VITALS — BP 128/82 | HR 59 | Temp 97.7°F | Resp 16 | Ht 64.0 in | Wt 167.8 lb

## 2020-01-02 DIAGNOSIS — R7309 Other abnormal glucose: Secondary | ICD-10-CM

## 2020-01-02 DIAGNOSIS — Z79899 Other long term (current) drug therapy: Secondary | ICD-10-CM | POA: Diagnosis not present

## 2020-01-02 DIAGNOSIS — I480 Paroxysmal atrial fibrillation: Secondary | ICD-10-CM

## 2020-01-02 DIAGNOSIS — I10 Essential (primary) hypertension: Secondary | ICD-10-CM | POA: Diagnosis not present

## 2020-01-02 DIAGNOSIS — E559 Vitamin D deficiency, unspecified: Secondary | ICD-10-CM | POA: Diagnosis not present

## 2020-01-02 DIAGNOSIS — D6859 Other primary thrombophilia: Secondary | ICD-10-CM

## 2020-01-02 DIAGNOSIS — E782 Mixed hyperlipidemia: Secondary | ICD-10-CM | POA: Diagnosis not present

## 2020-01-02 NOTE — Patient Instructions (Signed)
Due to recent changes in healthcare laws, you may see the results of your imaging and laboratory studies on MyChart before your provider has had a chance to review them.  We understand that in some cases there may be results that are confusing or concerning to you. Not all laboratory results come back in the same time frame and the provider may be waiting for multiple results in order to interpret others.  Please give us 48 hours in order for your provider to thoroughly review all the results before contacting the office for clarification of your results.     ++++++++++++++++++++++++++++++++++  Vit D  & Vit C 1,000 mg   are recommended to help protect  against the Covid-19 and other Corona viruses.    Also it's recommended  to take  Zinc 50 mg  to help  protect against the Covid-19   and best place to get  is also on [employer]  and don't pay more than 6-8 cents /pill !   ===================================== Coronavirus (COVID-19) Are you at risk?  Are you at risk for the Coronavirus (COVID-19)?  To be considered HIGH RISK for Coronavirus (COVID-19), you have to meet the following criteria:  . Traveled to [place], [place], [place], [place] or [place]; or in the [place] to [place], [place], [place]  . or [place]; and have fever, cough, and shortness of breath within the last 2 weeks of travel OR . Been in close contact with a person diagnosed with COVID-19 within the last 2 weeks and have  . fever, cough,and shortness of breath .  . IF YOU DO NOT MEET THESE CRITERIA, YOU ARE CONSIDERED LOW RISK FOR COVID-19.  What to do if you are HIGH RISK for COVID-19?  . If you are having a medical emergency, call 911. . Seek medical care right away. Before you go to a doctor's office, urgent care or emergency department, .  call ahead and tell them about your recent travel, contact with someone diagnosed with COVID-19  .  and your symptoms.  . You should receive instructions  from your physician's office regarding next steps of care.  . When you arrive at healthcare provider, tell the healthcare staff immediately you have returned from  . visiting [place], [place], [place], [place] or [place]; or traveled in the [place] to [place], [place],  . [place] or [place] in the last two weeks or you have been in close contact with a person diagnosed with  . COVID-19 in the last 2 weeks.   . Tell the health care staff about your symptoms: fever, cough and shortness of breath. . After you have been seen by a medical provider, you will be either: o Tested for (COVID-19) and discharged home on quarantine except to seek medical care if  o symptoms worsen, and asked to  - Stay home and avoid contact with others until you get your results (4-5 days)  - Avoid travel on public transportation if possible (such as bus, train, or airplane) or o Sent to the Emergency Department by EMS for evaluation, COVID-19 testing  and  o possible admission depending on your condition and test results.  What to do if you are LOW RISK for COVID-19?  Reduce your risk of any infection by using the same precautions used for avoiding the common cold or flu:  . Wash your hands often with soap and warm water for at least 20 seconds.  If soap and water   are not readily available,  . use an alcohol-based hand sanitizer with at least 60% alcohol.  . If coughing or sneezing, cover your mouth and nose by coughing or sneezing into the elbow areas of your shirt or coat, .  into a tissue or into your sleeve (not your hands). . Avoid shaking hands with others and consider head nods or verbal greetings only. . Avoid touching your eyes, nose, or mouth with unwashed hands.  . Avoid close contact with people who are sick. . Avoid places or events with large numbers of people in one location, like concerts or sporting events. . Carefully consider travel plans you have or are making. . If you are planning  any travel outside or inside the [place], visit the CDC's Travelers' Health webpage for the latest health notices. . If you have some symptoms but not all symptoms, continue to monitor at home and seek medical attention  . if your symptoms worsen. . If you are having a medical emergency, call 911.   ++++++++++++++++++++++++++++++++ Recommend Adult Low Dose Aspirin or  coated  Aspirin 81 mg daily  To reduce risk of Colon Cancer 40 %,  Skin Cancer 26 % ,  Melanoma 46%  and  Pancreatic cancer 60% ++++++++++++++++++++++++++++++++ Vitamin D goal  is between 70-100.  Please make sure that you are taking your Vitamin D as directed.  It is very important as a natural anti-inflammatory  helping hair, skin, and nails, as well as reducing stroke and heart attack risk.  It helps your bones and helps with mood. It also decreases numerous cancer risks so please take it as directed.  Low Vit D is associated with a 200-300% higher risk for CANCER  and 200-300% higher risk for HEART   ATTACK  &  STROKE.   ...................................... It is also associated with higher death rate at younger ages,  autoimmune diseases like Rheumatoid arthritis, Lupus, Multiple Sclerosis.    Also many other serious conditions, like depression, Alzheimer's Dementia, infertility, muscle aches, fatigue, fibromyalgia - just to name a few. ++++++++++++++++++++ Recommend the book "The END of DIETING" by Dr Joel Fuhrman  & the book "The END of DIABETES " by Dr Joel Fuhrman At [employer] - get book & Audio CD's    Being diabetic has a  300% increased risk for heart attack, stroke, cancer, and alzheimer- type vascular dementia. It is very important that you work harder with diet by avoiding all foods that are white. Avoid white rice (brown & wild rice is OK), white potatoes (sweetpotatoes in moderation is OK), White bread or wheat bread or anything made out of white flour like bagels, donuts, rolls, buns, biscuits, cakes,  pastries, cookies, pizza crust, and pasta (made from white flour & egg whites) - vegetarian pasta or spinach or wheat pasta is OK. Multigrain breads like Arnold's or Pepperidge [employer], or multigrain sandwich thins or flatbreads.  Diet, exercise and weight loss can reverse and cure diabetes in the early stages.  Diet, exercise and weight loss is very important in the control and prevention of complications of diabetes which affects every system in your body, ie. Brain - dementia/stroke, eyes - glaucoma/blindness, heart - heart attack/heart failure, kidneys - dialysis, stomach - gastric paralysis, intestines - malabsorption, nerves - severe painful neuritis, circulation - gangrene & loss of a leg(s), and finally cancer and Alzheimers.    I recommend avoid fried & greasy foods,  sweets/candy, white rice (brown or wild rice or Quinoa is OK), white potatoes (  sweet potatoes are OK) - anything made from white flour - bagels, doughnuts, rolls, buns, biscuits,white and wheat breads, pizza crust and traditional pasta made of white flour & egg white(vegetarian pasta or spinach or wheat pasta is OK).  Multi-grain bread is OK - like multi-grain flat bread or sandwich thins. Avoid alcohol in excess. Exercise is also important.    Eat all the vegetables you want - avoid meat, especially red meat and dairy - especially cheese.  Cheese is the most concentrated form of trans-fats which is the worst thing to clog up our arteries. Veggie cheese is OK which can be found in the fresh produce section at [employer] or [employer] or [employer]  +++++++++++++++++++++ DASH Eating Plan  DASH stands for "Dietary Approaches to Stop Hypertension."   The DASH eating plan is a healthy eating plan that has been shown to reduce high blood pressure (hypertension). Additional health benefits may include reducing the risk of type 2 diabetes mellitus, heart disease, and stroke. The DASH eating plan may also help with weight loss. WHAT DO I  NEED TO KNOW ABOUT THE DASH EATING PLAN? For the DASH eating plan, you will follow these general guidelines:  Choose foods with a percent daily value for sodium of less than 5% (as listed on the food label).  Use salt-free seasonings or herbs instead of table salt or sea salt.  Check with your health care provider or pharmacist before using salt substitutes.  Eat lower-sodium products, often labeled as "lower sodium" or "no salt added."  Eat fresh foods.  Eat more vegetables, fruits, and low-fat dairy products.  Choose whole grains. Look for the word "whole" as the first word in the ingredient list.  Choose fish   Limit sweets, desserts, sugars, and sugary drinks.  Choose heart-healthy fats.  Eat veggie cheese   Eat more home-cooked food and less restaurant, buffet, and fast food.  Limit fried foods.  Cook foods using methods other than frying.  Limit canned vegetables. If you do use them, rinse them well to decrease the sodium.  When eating at a restaurant, ask that your food be prepared with less salt, or no salt if possible.                      WHAT FOODS CAN I EAT? Read Dr Joel Fuhrman's books on The End of Dieting & The End of Diabetes  Grains Whole grain or whole wheat bread. Brown rice. Whole grain or whole wheat pasta. Quinoa, bulgur, and whole grain cereals. Low-sodium cereals. Corn or whole wheat flour tortillas. Whole grain cornbread. Whole grain crackers. Low-sodium crackers.  Vegetables Fresh or frozen vegetables (raw, steamed, roasted, or grilled). Low-sodium or reduced-sodium tomato and vegetable juices. Low-sodium or reduced-sodium tomato sauce and paste. Low-sodium or reduced-sodium canned vegetables.   Fruits All fresh, canned (in natural juice), or frozen fruits.  Protein Products  All fish and seafood.  Dried beans, peas, or lentils. Unsalted nuts and seeds. Unsalted canned beans.  Dairy Low-fat dairy products, such as skim or 1% milk, 2% or  reduced-fat cheeses, low-fat ricotta or cottage cheese, or plain low-fat yogurt. Low-sodium or reduced-sodium cheeses.  Fats and Oils Tub margarines without trans fats. Light or reduced-fat mayonnaise and salad dressings (reduced sodium). Avocado. Safflower, olive, or canola oils. Natural peanut or almond butter.  Other Unsalted popcorn and pretzels. The items listed above may not be a complete list of recommended foods or beverages. Contact your dietitian for more   options.  +++++++++++++++  WHAT FOODS ARE NOT RECOMMENDED? Grains/ White flour or wheat flour White bread. White pasta. White rice. Refined cornbread. Bagels and croissants. Crackers that contain trans fat.  Vegetables  Creamed or fried vegetables. Vegetables in a . Regular canned vegetables. Regular canned tomato sauce and paste. Regular tomato and vegetable juices.  Fruits Dried fruits. Canned fruit in light or heavy syrup. Fruit juice.  Meat and Other Protein Products Meat in general - RED meat & White meat.  Fatty cuts of meat. Ribs, chicken wings, all processed meats as bacon, sausage, bologna, salami, fatback, hot dogs, bratwurst and packaged luncheon meats.  Dairy Whole or 2% milk, cream, half-and-half, and cream cheese. Whole-fat or sweetened yogurt. Full-fat cheeses or blue cheese. Non-dairy creamers and whipped toppings. Processed cheese, cheese spreads, or cheese curds.  Condiments Onion and garlic salt, seasoned salt, table salt, and sea salt. Canned and packaged gravies. Worcestershire sauce. Tartar sauce. Barbecue sauce. Teriyaki sauce. Soy sauce, including reduced sodium. Steak sauce. Fish sauce. Oyster sauce. Cocktail sauce. Horseradish. Ketchup and mustard. Meat flavorings and tenderizers. Bouillon cubes. Hot sauce. Tabasco sauce. Marinades. Taco seasonings. Relishes.  Fats and Oils Butter, stick margarine, lard, shortening and bacon fat. Coconut, palm kernel, or palm oils. Regular salad  dressings.  Pickles and olives. Salted popcorn and pretzels.  The items listed above may not be a complete list of foods and beverages to avoid.  ==========================================  Bleeding Precautions When on Anticoagulant Therapy  Anticoagulant therapy, also called blood thinner therapy, is medicine that helps to prevent and treat blood clots. The medicine works by stopping blood clots from forming or growing. Blood clots that form in your blood vessels can be dangerous. They can break loose and travel to the heart, lungs, or brain. This increases the risk of a heart attack, stroke, or blocked lung artery (pulmonary embolism). Anticoagulants also increase the risk of bleeding. Try to protect yourself from cuts and other injuries that can cause bleeding. It is important to take anticoagulants exactly as told by your health care provider. Why do I need to be on anticoagulant therapy? You may need this medicine if you are at risk of developing a blood clot. Conditions that increase your risk of a blood clot include:  Being born with heart disease or a heart malformation (congenital heart disease).  Developing heart disease.  Having had surgery, such as valve replacement.  Having had a serious accident or other type of severe injury (trauma).  Having certain types of cancer.  Having certain diseases that can increase blood clotting.  Having a high risk of stroke or heart attack.  Having atrial fibrillation (AF). What are the common anticoagulant medicines? There are several types of anticoagulant medicines. The most common types are:  Medicines that you take by mouth (oral medicines), such as: ? Warfarin. ? Novel oral anticoagulants (NOACs), such as:  Direct thrombin inhibitors (dabigatran).  Factor Xa inhibitors (apixaban, edoxaban, and rivaroxaban).  Injections, such as: ? Unfractionated heparin. ? Low molecular weight heparin. These anticoagulants work in  different ways to prevent blood clots. They also have different risks and side effects. What do I need to remember while on anticoagulant therapy? Taking anticoagulants  Take your medicine at the same time every day. If you forget to take your medicine, take it as soon as you remember. Do not double your dosage of medicine if you miss a whole day. Take your normal dose and call your health care provider.  Do not stop  taking your medicine unless your health care provider approves. Stopping the medicine can increase your risk of developing a blood clot. Taking other medicines  Take over-the-counter and prescriptions medicines only as told by your health care provider.  Do not take over-the-counter NSAIDs, including aspirin and ibuprofen, while you are on anticoagulant therapy. These medicines increase your risk of dangerous bleeding.  Get approval from your health care provider before you start taking any new medicines, vitamins, or herbal products. Some of these could interfere with your therapy. General instructions  Keep all follow-up visits as told by your health care provider. This is important.  If you are pregnant or trying to get pregnant, talk with a health care provider about anticoagulants. Some of these medicines are not safe to take during pregnancy.  Tell all health care providers, including your dentist, that you are on anticoagulant therapy. It is especially important to tell providers before you have any surgery, medical procedures, or dental work done. What precautions should I take?   Be very careful when using knives, scissors, or other sharp objects.  Use an electric razor instead of a blade.  Do not use toothpicks.  Use a soft-bristled toothbrush. Brush your teeth gently.  Always wear shoes outdoors and wear slippers indoors.  Be careful when cutting your fingernails and toenails.  Place bath mats in the bathroom. If possible, install handrails as well.  Wear  gloves while you do yard work.  Wear your seat belt.  Prevent falls by removing loose rugs and extension cords from areas where you walk. Use a cane or walker if you need it.  Avoid constipation by: ? Drinking enough fluid to keep your urine clear or pale yellow. ? Eating foods that are high in fiber, such as fresh fruits and vegetables, whole grains, and beans. ? Limiting foods that are high in fat and processed sugars, such as fried and sweet foods.  Do not play contact sports or participate in other activities that have a high risk for injury. What other precautions are important if on warfarin therapy? If you are taking a type of anticoagulant called warfarin, make sure you:  Work with a diet and nutrition specialist (dietitian) to make an eating plan. Do not make any sudden changes to your diet after you have started your eating plan.  Do not drink alcohol. It can interfere with your medicine and increase your risk of an injury that causes bleeding.  Get regular blood tests as told by your health care provider. What are some questions to ask my health care provider?  Why do I need anticoagulant therapy?  What is the best anticoagulant therapy for my condition?  How long will I need anticoagulant therapy?  What are the side effects of anticoagulant therapy?  When should I take my medicine? What should I do if I forget to take it?  Will I need to have regular blood tests?  Do I need to change my diet? Are there foods or drinks that I should avoid?  What activities are safe for me?  What should I do if I want to get pregnant? Contact a health care provider if:  You miss a dose of medicine: ? And you are not sure what to do. ? For more than one day.  You have: ? Menstrual bleeding that is heavier than normal. ? Bloody or brown urine. ? Easy bruising. ? Black and tarry stool or bright red stool. ? Side effects from your medicine.  You feel weak or dizzy.  You  become pregnant. Get help right away if:  You have bleeding that will not stop within 20 minutes from: ? The nose. ? The gums. ? A cut on the skin.  You have a severe headache or stomachache.  You vomit or cough up blood.  You fall or hit your head. Summary  Anticoagulant therapy, also called blood thinner therapy, is medicine that helps to prevent and treat blood clots.  Anticoagulants work in different ways to prevent blood clots. They also have different risks and side effects.  Talk with your health care provider about any precautions that you should take while on anticoagulant therapy. This information is not intended to replace advice given to you by your health care provider. Make sure you discuss any questions you have with your health care provider. Document Revised: 06/22/2018 Document Reviewed: 05/19/2016 Elsevier Patient Education  [place].

## 2020-01-02 NOTE — Progress Notes (Signed)
History of Present Illness:       This very nice 79 y.o.  DWF presents for 6 month follow up with HTN, HLD, Pre-Diabetes and Vitamin D Deficiency.  In August, patient underwent Rt hip Replacement by Dr Percell Miller.   She had surgery for a Brain Aneurysm in 2001 andhas hadintermittant vertigo since then.       Patient is treated for HTN (2009) . BP has been controlled at home. Today's BP is at goal - 128/82. In 2009 she had a False (+) Myoview and aNegative Heart Cath. Patient is followed by Dr Einar Gip for pAfib (2019) . Patient has had no complaints of any cardiac type chest pain, palpitations, dyspnea / orthopnea / PND, dizziness, claudication, or dependent edema.       Hyperlipidemia is controlled with diet & meds. Patient denies myalgias or other med SE's. Last Lipids were   Lab Results  Component Value Date   CHOL 162 10/02/2019   HDL 44 (L) 10/02/2019   LDLCALC 92 10/02/2019   TRIG 165 (H) 10/02/2019   CHOLHDL 3.7 10/02/2019    Also, the patient has history of PreDiabetes (A1c 5.9% /2013)and has had no symptoms of reactive hypoglycemia, diabetic polys, paresthesias or visual blurring.  Last A1c was Normal & at goal:  Lab Results  Component Value Date   HGBA1C 5.5 10/06/2019          Further, the patient also has history of Vitamin D Deficiency ("11" /2008)and supplements vitamin D without any suspected side-effects. Last vitamin D was near goal:  Lab Results  Component Value Date   VD25OH 56 06/28/2019    Current Outpatient Medications on File Prior to Visit  Medication Sig  . acetaminophen (TYLENOL) 500 MG tablet Take 1,000 mg by mouth every 6 (six) hours as needed for mild pain or moderate pain.  Marland Kitchen apixaban (ELIQUIS) 5 MG TABS tablet Take 1 tablet  2 times daily  . baclofen  10 MG tablet Take 1 tablet 2 times daily.  . Cholecalciferol 1000 U  Take 1,000 Units  daily.   . diazepam (5 MG tablet Take 1/2 to 1 tablet 2 to 3 x /day for Dizziness / Vertigo  .  ferrous sulfate 325 (65 FE) MG EC tablet Take 325 mg  3  times a week.   . metoprolol tartrate  25 MG tablet Take 1 tablet Daily for BP  . ondansetron  4 MG tablet Take 1 tablet every 8  hours as needed for nausea   . pantoprazole 40 MG tablet Take 1 tablet Daily for Heartburn & Indigestion    No Known Allergies  PMHx:   Past Medical History:  Diagnosis Date  . Anemia   . Arthritis   . Benign neoplasm of sigmoid colon   . Deafness    left ear per pt   . Dyspnea    pt reports with exertion due to hip and knees   . Dysrhythmia    afib   . History of bleeding ulcers    2019 received 3-4 units of blood   . Hyperlipidemia   . Hypertension   . Pericardial effusion 02/04/2018  . Pre-diabetes    per Dr Einar Gip note of 09/28/19 pt denies at preop visit on 10/06/19   . Prediabetes   . Primary localized osteoarthritis of right hip 09/26/2019  . Pulmonary embolism ([place])    hx of 2019   . Vertigo   . Vitamin D deficiency  Immunization History  Administered Date(s) Administered  . DT (Pediatric) 01/24/2014  . Pneumococcal Conjugate-13 05/26/2018  . Pneumococcal Polysaccharide-23 10/06/2011  . Td 04/23/2003    Past Surgical History:  Procedure Laterality Date  . BIOPSY  02/05/2018   Procedure: BIOPSY;  Surgeon: Ladene Artist, MD;  Location: [facility];  Service: Endoscopy;;  . CATARACT EXTRACTION W/ INTRAOCULAR LENS  IMPLANT, BILATERAL  2009  . CEREBRAL ANEURYSM REPAIR    . COLONOSCOPY N/A 02/05/2018   Procedure: COLONOSCOPY;  Surgeon: Ladene Artist, MD;  Location: [facility];  Service: Endoscopy;  Laterality: N/A;  . ESOPHAGOGASTRODUODENOSCOPY N/A 02/05/2018   Procedure: ESOPHAGOGASTRODUODENOSCOPY (EGD);  Surgeon: Ladene Artist, MD;  Location: [facility];  Service: Endoscopy;  Laterality: N/A;  . FOOT SURGERY  1993  . POLYPECTOMY  02/05/2018   Procedure: POLYPECTOMY;  Surgeon: Ladene Artist, MD;  Location: [facility];  Service: Endoscopy;;  . TOTAL HIP  ARTHROPLASTY Right 10/17/2019   Procedure: TOTAL HIP ARTHROPLASTY ANTERIOR APPROACH;  Surgeon: Renette Butters, MD;  Location: [facility];  Service: Orthopedics;  Laterality: Right;  . TUBAL LIGATION      FHx:    Reviewed / unchanged  SHx:    Reviewed / unchanged   Systems Review:  Constitutional: Denies fever, chills, wt changes, headaches, insomnia, fatigue, night sweats, change in appetite. Eyes: Denies redness, blurred vision, diplopia, discharge, itchy, watery eyes.  ENT: Denies discharge, congestion, post nasal drip, epistaxis, sore throat, earache, hearing loss, dental pain, tinnitus, vertigo, sinus pain, snoring.  CV: Denies chest pain, palpitations, irregular heartbeat, syncope, dyspnea, diaphoresis, orthopnea, PND, claudication or edema. Respiratory: denies cough, dyspnea, DOE, pleurisy, hoarseness, laryngitis, wheezing.  Gastrointestinal: Denies dysphagia, odynophagia, heartburn, reflux, water brash, abdominal pain or cramps, nausea, vomiting, bloating, diarrhea, constipation, hematemesis, melena, hematochezia  or hemorrhoids. Genitourinary: Denies dysuria, frequency, urgency, nocturia, hesitancy, discharge, hematuria or flank pain. Musculoskeletal: Denies arthralgias, myalgias, stiffness, jt. swelling, pain, limping or strain/sprain.  Skin: Denies pruritus, rash, hives, warts, acne, eczema or change in skin lesion(s). Neuro: No weakness, tremor, incoordination, spasms, paresthesia or pain. Psychiatric: Denies confusion, memory loss or sensory loss. Endo: Denies change in weight, skin or hair change.  Heme/Lymph: No excessive bleeding, bruising or enlarged lymph nodes.  Physical Exam  BP 128/82   Pulse (!) 59   Temp 97.7 F (36.5 C)   Resp 16   Ht 5\' 4"  (1.626 m)   Wt 167 lb 12.8 oz (76.1 kg)   SpO2 99%   BMI 28.80 kg/m   Appears  well nourished, well groomed  and in no distress.  Eyes: PERRLA, EOMs, conjunctiva no swelling or erythema. Sinuses: No frontal/maxillary  tenderness ENT/Mouth: EAC's clear, TM's nl w/o erythema, bulging. Nares clear w/o erythema, swelling, exudates. Oropharynx clear without erythema or exudates. Oral hygiene is good. Tongue normal, non obstructing. Hearing intact.  Neck: Supple. Thyroid not palpable. Car 2+/2+ without bruits, nodes or JVD. Chest: Respirations nl with BS clear & equal w/o rales, rhonchi, wheezing or stridor.  Cor: Heart sounds normal w/ regular rate and rhythm without sig. murmurs, gallops, clicks or rubs. Peripheral pulses normal and equal  without edema.  Abdomen: Soft & bowel sounds normal. Non-tender w/o guarding, rebound, hernias, masses or organomegaly.  Lymphatics: Unremarkable.  Musculoskeletal: Full ROM all peripheral extremities, joint stability, 5/5 strength and normal gait.  Skin: Warm, dry without exposed rashes, lesions or ecchymosis apparent.  Neuro: Cranial nerves intact, reflexes equal bilaterally. Sensory-motor testing grossly intact. Tendon reflexes grossly intact.  Pysch:  Alert & oriented x 3.  Insight and judgement nl & appropriate. No ideations.  Assessment and Plan:  1. Essential hypertension  - Continue medication, monitor blood pressure at home.  - Continue DASH diet.  Reminder to go to the ER if any CP,  SOB, nausea, dizziness, severe HA, changes vision/speech.  - CBC with Differential/Platelet - COMPLETE METABOLIC PANEL WITH GFR - Magnesium - TSH  2. Hyperlipidemia, mixed  - Continue diet/meds, exercise,& lifestyle modifications.  - Continue monitor periodic cholesterol/liver & renal functions   - Lipid panel - TSH  3. Abnormal glucose  - Continue diet, exercise  - Lifestyle modifications.  - Monitor appropriate labs.  - Hemoglobin A1c - Insulin, random  4. Paroxysmal atrial fibrillation ([name])  - Continue supplementation.  - TSH  5. Vitamin D deficiency  - VITAMIN D 25 Hydroxy   6. Thrombophilia ([name])  - CBC with Differential/Platelet  7. Medication  management  - CBC with Differential/Platelet - COMPLETE METABOLIC PANEL WITH GFR - Magnesium - Lipid panel - TSH - Hemoglobin A1c - Insulin, random - VITAMIN D 25 Hydroxy         Discussed  regular exercise, BP monitoring, weight control to achieve/maintain BMI less than 25 and discussed med and SE's. Recommended labs to assess and monitor clinical status with further disposition pending results of labs.  I discussed the assessment and treatment plan with the patient. The patient was provided an opportunity to ask questions and all were answered. The patient agreed with the plan and demonstrated an understanding of the instructions.  I provided over 30 minutes of exam, counseling, chart review and  complex critical decision making.   Kirtland Bouchard, MD

## 2020-01-03 LAB — COMPLETE METABOLIC PANEL WITH GFR
AG Ratio: 2.1 (calc) (ref 1.0–2.5)
ALT: 7 U/L (ref 6–29)
AST: 13 U/L (ref 10–35)
Albumin: 4.1 g/dL (ref 3.6–5.1)
Alkaline phosphatase (APISO): 99 U/L (ref 37–153)
BUN/Creatinine Ratio: 20 (calc) (ref 6–22)
BUN: 24 mg/dL (ref 7–25)
CO2: 29 mmol/L (ref 20–32)
Calcium: 9.4 mg/dL (ref 8.6–10.4)
Chloride: 107 mmol/L (ref 98–110)
Creat: 1.22 mg/dL — ABNORMAL HIGH (ref 0.60–0.93)
GFR, Est African American: 49 mL/min/{1.73_m2} — ABNORMAL LOW (ref >=60)
GFR, Est Non African American: 43 mL/min/{1.73_m2} — ABNORMAL LOW (ref >=60)
Globulin: 2 g/dL (calc) (ref 1.9–3.7)
Glucose, Bld: 98 mg/dL (ref 65–99)
Potassium: 4.6 mmol/L (ref 3.5–5.3)
Sodium: 143 mmol/L (ref 135–146)
Total Bilirubin: 0.3 mg/dL (ref 0.2–1.2)
Total Protein: 6.1 g/dL (ref 6.1–8.1)

## 2020-01-03 LAB — CBC WITH DIFFERENTIAL/PLATELET
Absolute Monocytes: 510 cells/uL (ref 200–950)
Basophils Absolute: 39 cells/uL (ref 0–200)
Basophils Relative: 0.7 %
Eosinophils Absolute: 39 cells/uL (ref 15–500)
Eosinophils Relative: 0.7 %
HCT: 39.1 % (ref 35.0–45.0)
Hemoglobin: 12.8 g/dL (ref 11.7–15.5)
Lymphs Abs: 1154 cells/uL (ref 850–3900)
MCH: 30 pg (ref 27.0–33.0)
MCHC: 32.7 g/dL (ref 32.0–36.0)
MCV: 91.6 fL (ref 80.0–100.0)
MPV: 13.3 fL — ABNORMAL HIGH (ref 7.5–12.5)
Monocytes Relative: 9.1 %
Neutro Abs: 3858 cells/uL (ref 1500–7800)
Neutrophils Relative %: 68.9 %
Platelets: 220 10*3/uL (ref 140–400)
RBC: 4.27 10*6/uL (ref 3.80–5.10)
RDW: 13.9 % (ref 11.0–15.0)
Total Lymphocyte: 20.6 %
WBC: 5.6 10*3/uL (ref 3.8–10.8)

## 2020-01-03 LAB — HEMOGLOBIN A1C
Hgb A1c MFr Bld: 5.1 % of total Hgb (ref <5.7)
Mean Plasma Glucose: 100 (calc)
eAG (mmol/L): 5.5 (calc)

## 2020-01-03 LAB — LIPID PANEL
Cholesterol: 155 mg/dL (ref <200)
HDL: 46 mg/dL — ABNORMAL LOW (ref >=50)
LDL Cholesterol (Calc): 81 mg/dL (calc)
Non-HDL Cholesterol (Calc): 109 mg/dL (calc) (ref <130)
Total CHOL/HDL Ratio: 3.4 (calc) (ref <5.0)
Triglycerides: 187 mg/dL — ABNORMAL HIGH (ref <150)

## 2020-01-03 LAB — VITAMIN D 25 HYDROXY (VIT D DEFICIENCY, FRACTURES): Vit D, 25-Hydroxy: 67 ng/mL (ref 30–100)

## 2020-01-03 LAB — INSULIN, RANDOM: Insulin: 9 u[IU]/mL

## 2020-01-03 LAB — TSH: TSH: 2.97 mIU/L (ref 0.40–4.50)

## 2020-01-03 LAB — MAGNESIUM: Magnesium: 2.3 mg/dL (ref 1.5–2.5)

## 2020-01-03 NOTE — Progress Notes (Signed)
========================================================== -   Test results slightly outside the reference range are not unusual. If there is anything important, I will review this with you,  otherwise it is considered normal test values.  If you have further questions,  please do not hesitate to contact me at the office or via My Chart.  ==========================================================  -  Kidney functions look worse - a little dehydrated again   -  It's very import to drink lots of fluids - at least 5 bottles (16 oz )of water or liquids /day  ==========================================================  -  Total Chol = 155 -  Excellent   - Very low risk for Heart Attack  / Stroke ==========================================================  - A1c    staying in Normal nonDiabetic range - Great ! ==========================================================  -  Vitamin D = 67- Excellent  ==========================================================  -  All Else - CBC - Kidneys - Electrolytes - Liver - Magnesium & Thyroid    - all  Normal / OK ====================================================   - Keep up the [place] Work  ! ==========================================================

## 2020-01-06 ENCOUNTER — Other Ambulatory Visit: Payer: Self-pay | Admitting: Internal Medicine

## 2020-01-06 DIAGNOSIS — D649 Anemia, unspecified: Secondary | ICD-10-CM

## 2020-03-02 IMAGING — CR DG CHEST 2V
2 series · 2 of 2 positions shown · non-contrast
Comparison: 06/30/2017

CLINICAL DATA: Pt states 1 month of new sob with any type of
walking or activity, she is unaware of any heart disease, hx non
smoker, no meds for htn or diabetes

EXAM:
CHEST - 2 VIEW

[chest pa]
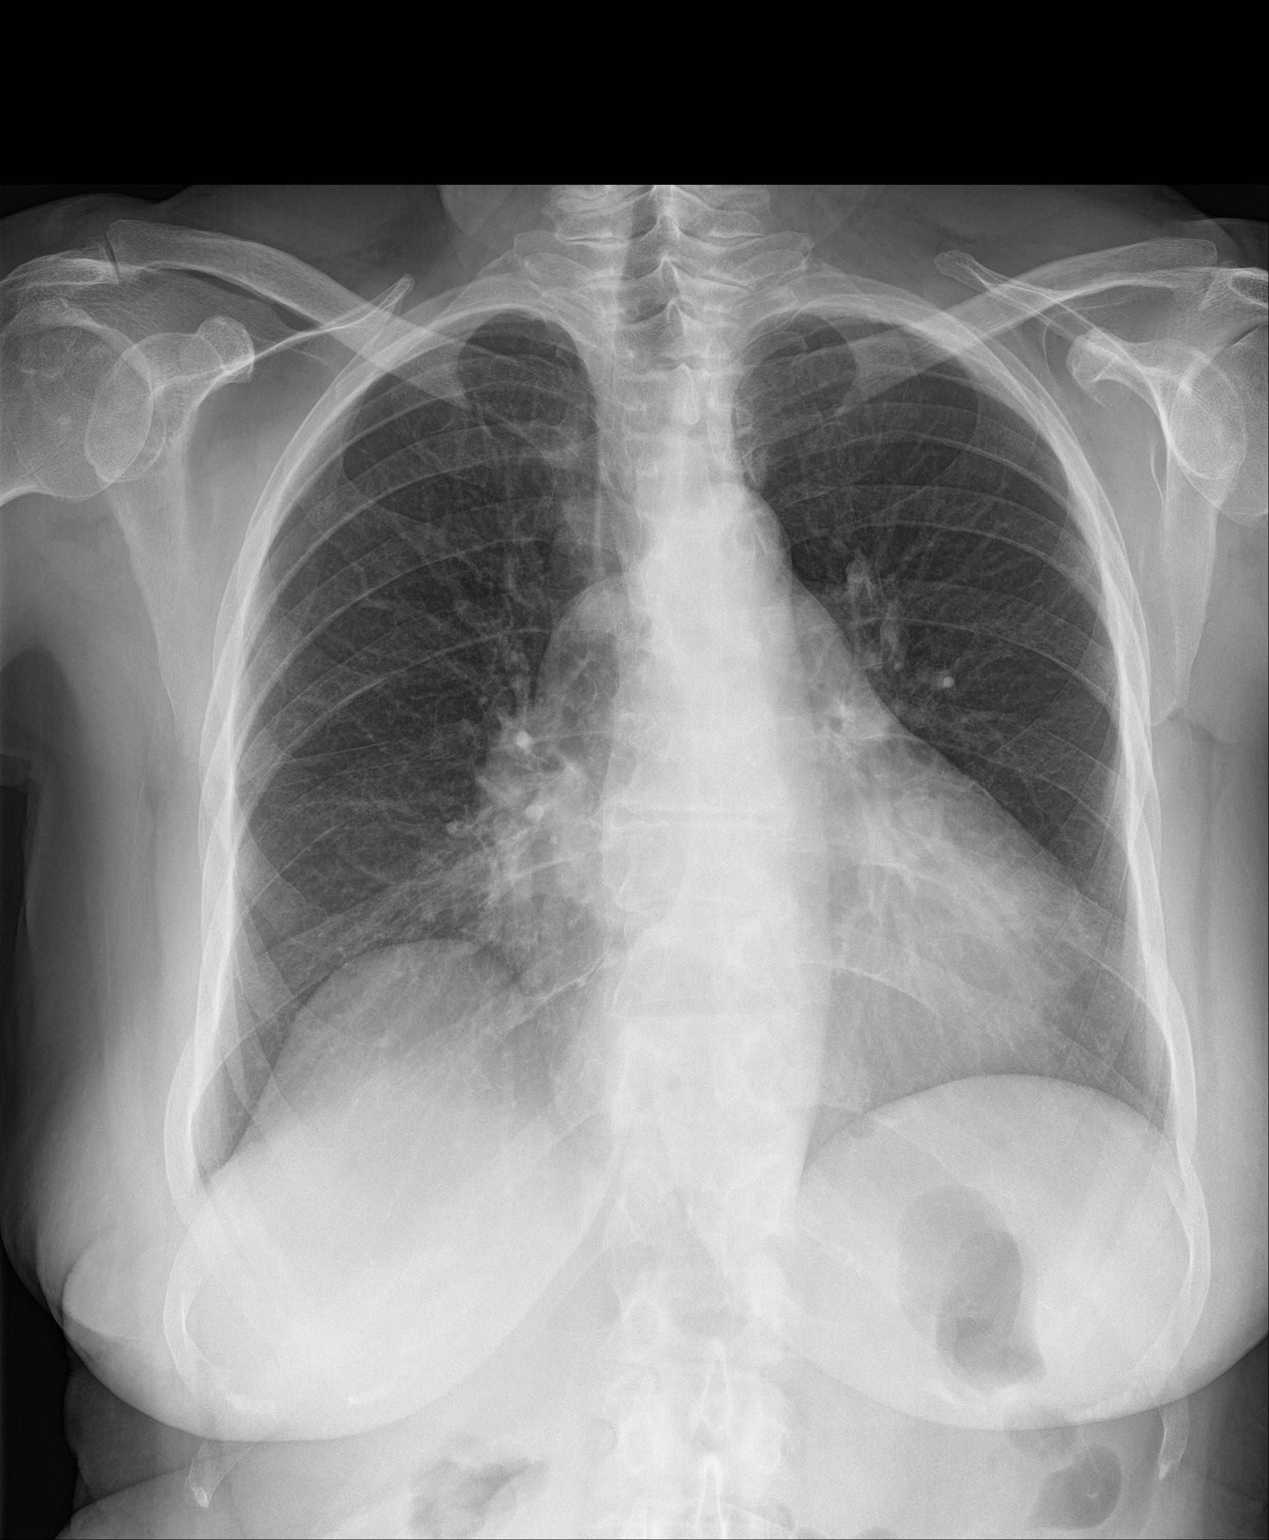

[chest lat]
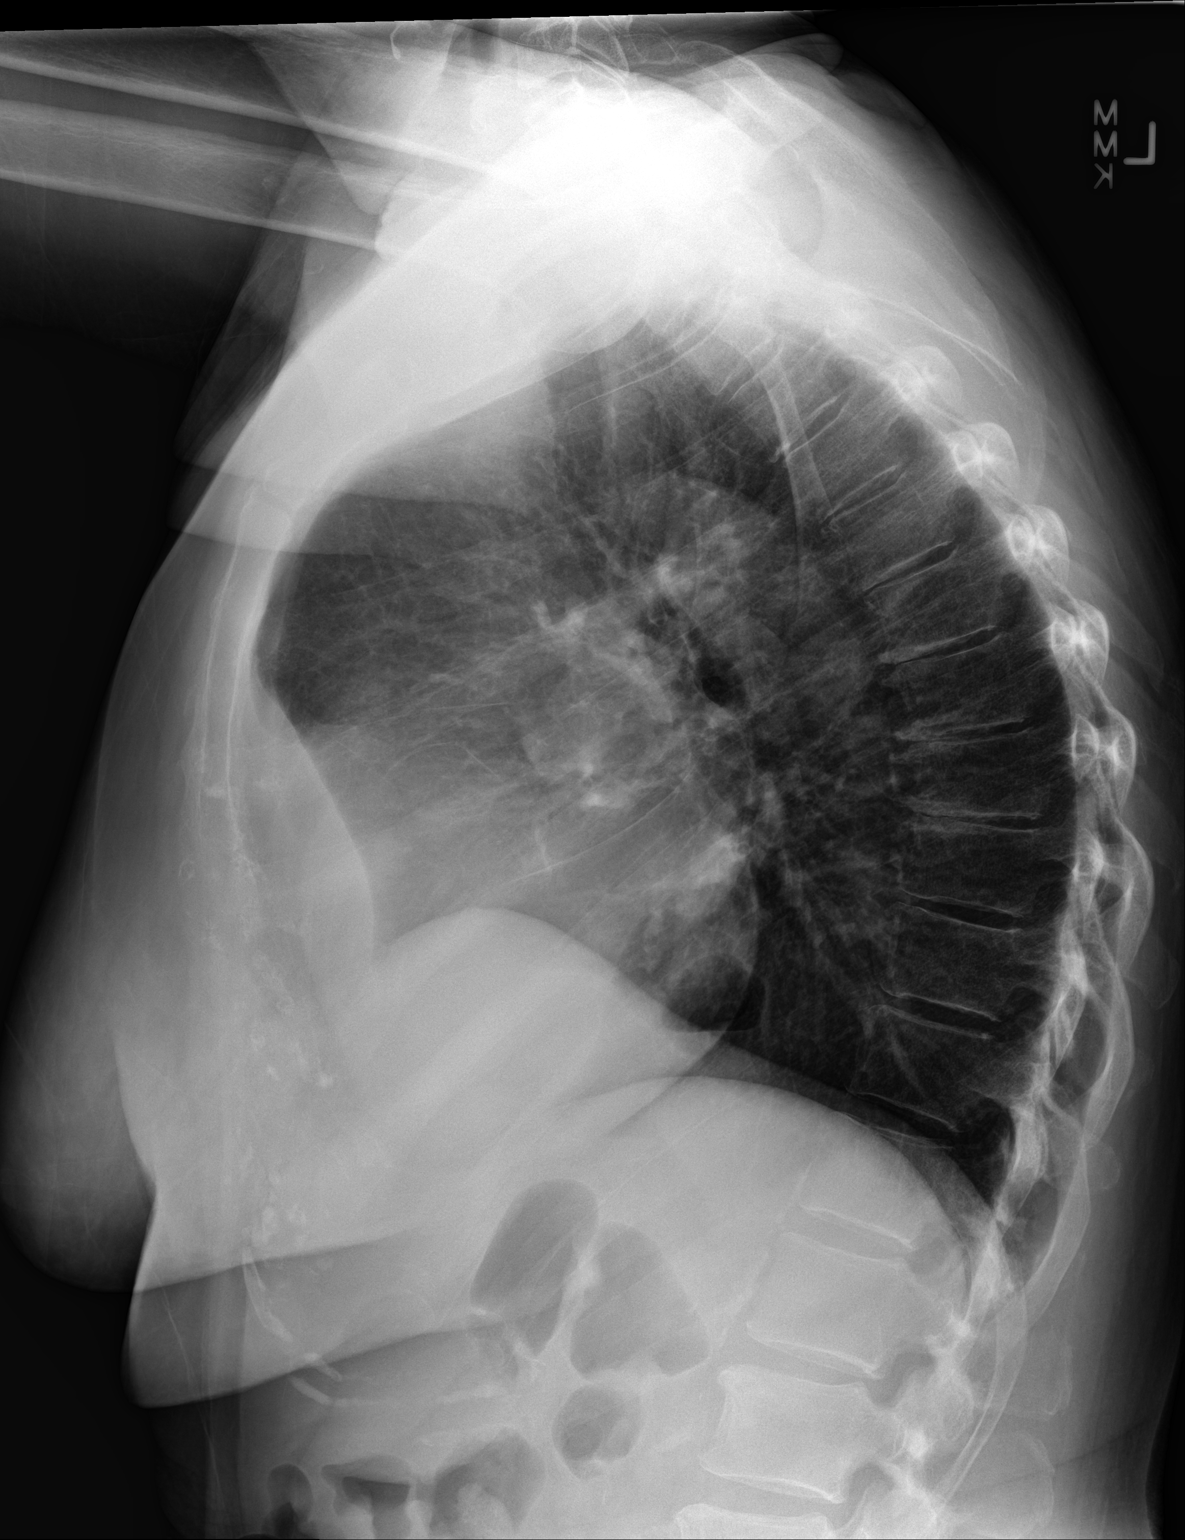

[2 of 2 positions shown; findings below may reference images not displayed]

FINDINGS: The heart is enlarged compared to prior study. There is fullness in
the RIGHT hilar and suprahilar regions, raising the question of
adenopathy. The lungs are clear. No pulmonary edema. Mild
midthoracic spondylosis. Note is made of a hiatal hernia.
IMPRESSION: 1. Enlarged cardiopericardial silhouette. Question of pericardial
effusion.
2. Fullness in the RIGHT hilar/suprahilar regions, raising the
question of adenopathy. Recommend further evaluation with chest CT.
Recommend intravenous contrast unless contraindicated.

These results will be called to the ordering clinician or
representative by the Radiologist Assistant, and communication
documented in the PACS or zVision Dashboard.

## 2020-03-02 IMAGING — CT CT CHEST W/ CM
2 of 3 series · 15 of 36 positions shown, 18 images · IV contrast (omnipaque)
Comparison: Chest radiograph 02/03/2018

CLINICAL DATA: Patient with dry cough for 3 weeks. Evaluate for
possible hilar mass.

EXAM:
CT CHEST WITH CONTRAST
TECHNIQUE: Multidetector CT imaging of the chest was performed during
intravenous contrast administration.
CONTRAST:  75mL OMNIPAQUE IOHEXOL 300 MG/ML  SOLN

[Series 2: axial st · axial · 0.66mm/px · z∈[-181,+63]mm · 12 of 144 slices shown, 15 images]
[im 11/144  mediastinal]
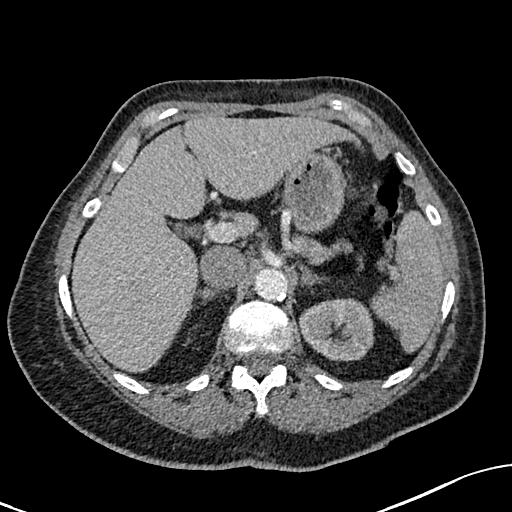
[im 11/144  lung]
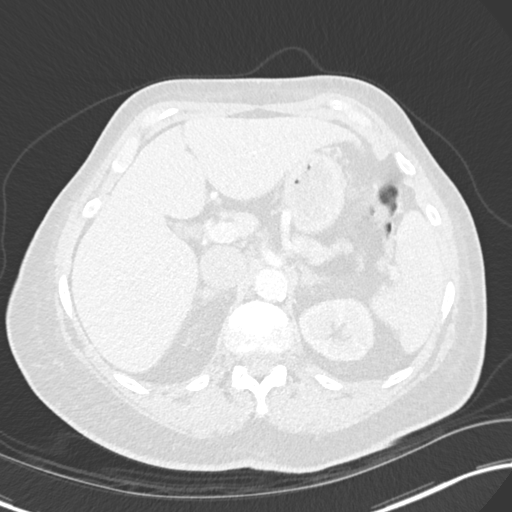
[im 22/144  lung]
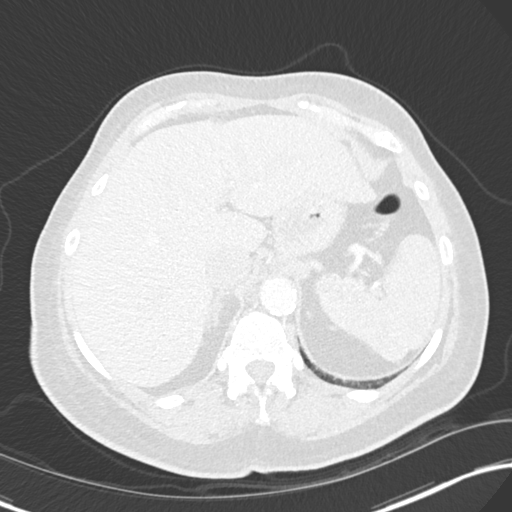
[im 32/144  lung]
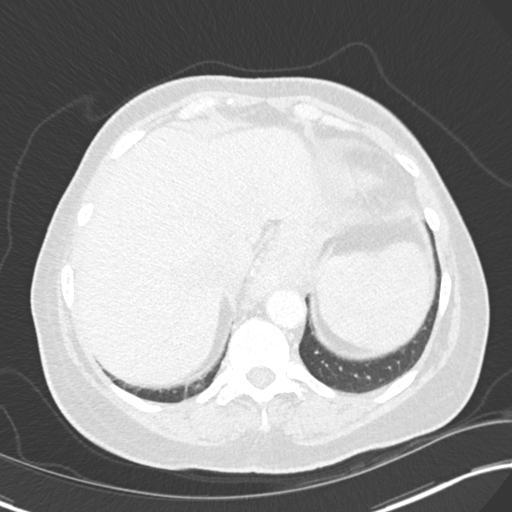
[im 43/144  lung]
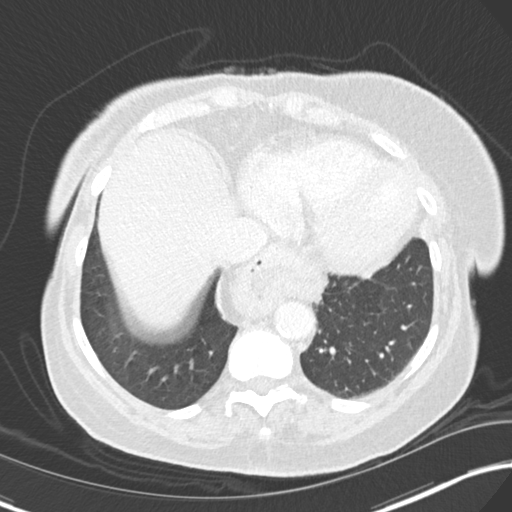
[im 53/144  mediastinal]
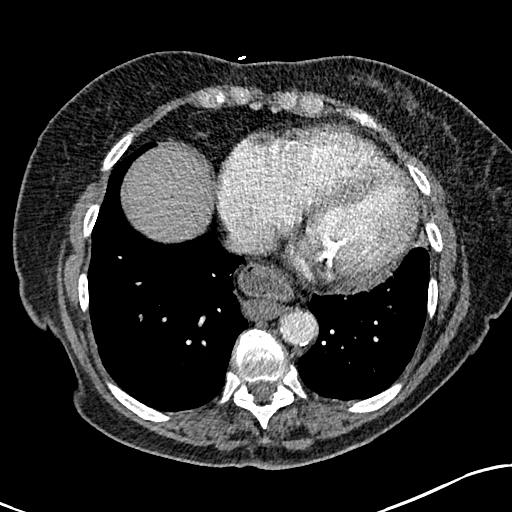
[im 53/144  lung]
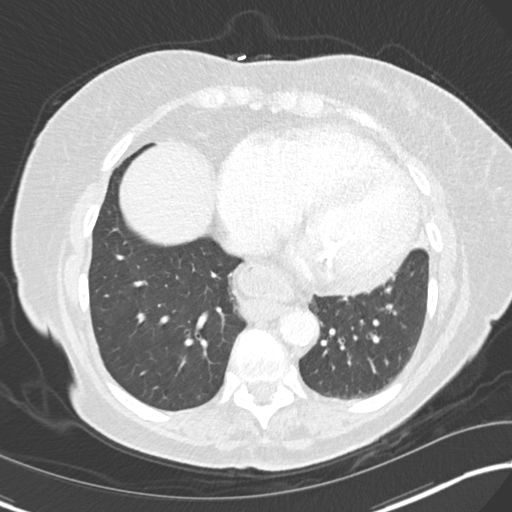
[im 64/144  lung]
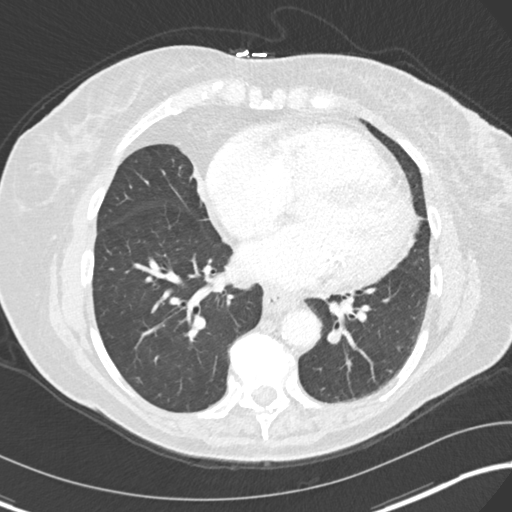
[im 80/144  lung]
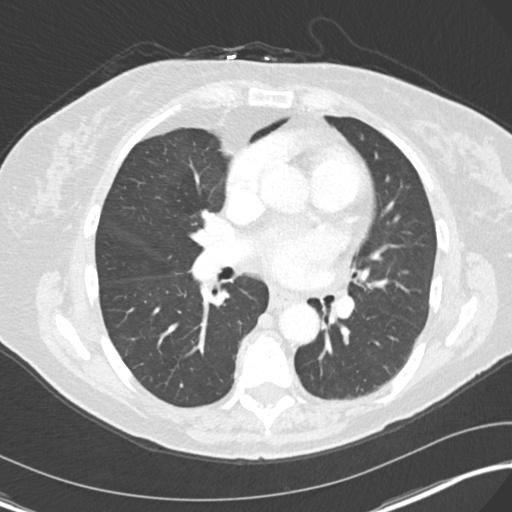
[im 91/144  lung]
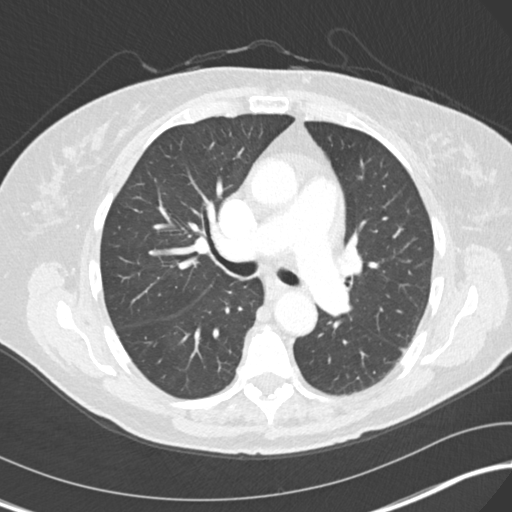
[im 101/144  mediastinal]
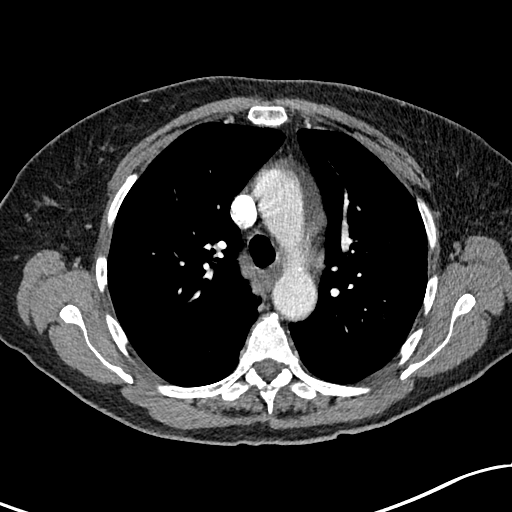
[im 101/144  lung]
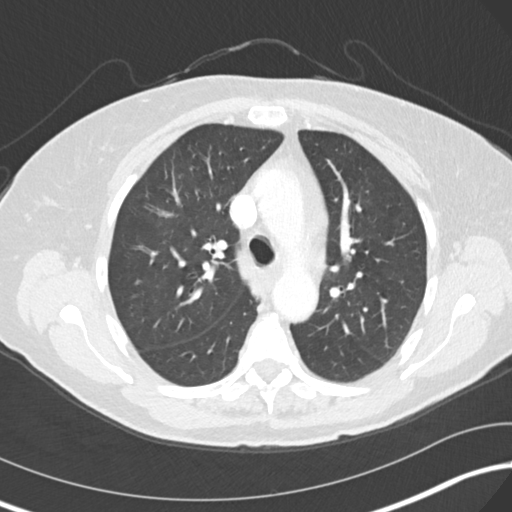
[im 112/144  lung]
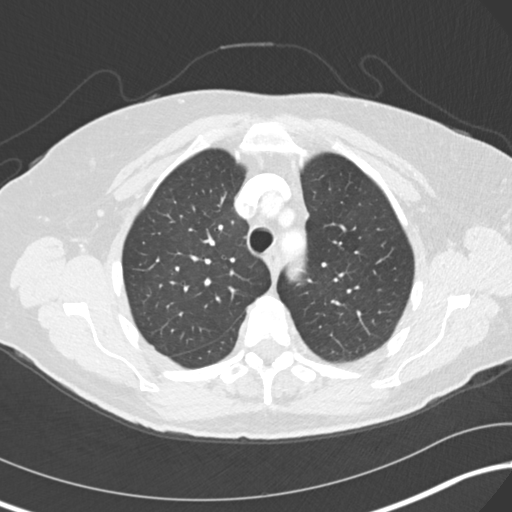
[im 122/144  lung]
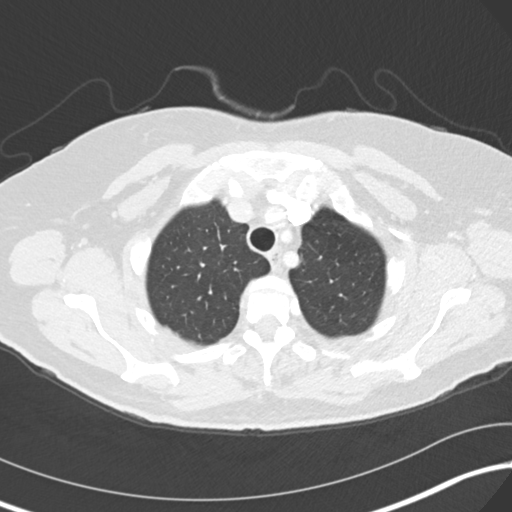
[im 133/144  lung]
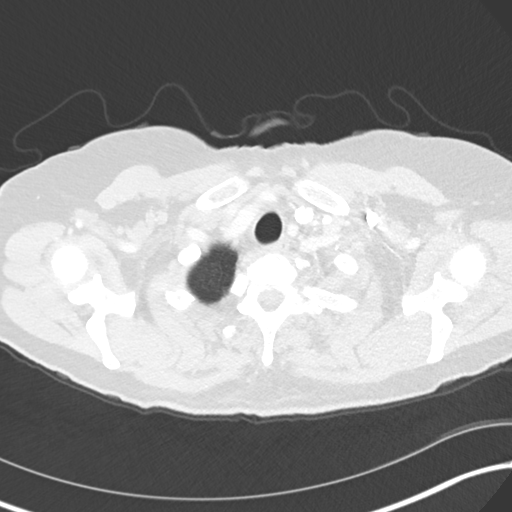

[Series 6: coronal · coronal · 0.59mm/px · 3 of 125 slices shown]
[im 25/125  lung]
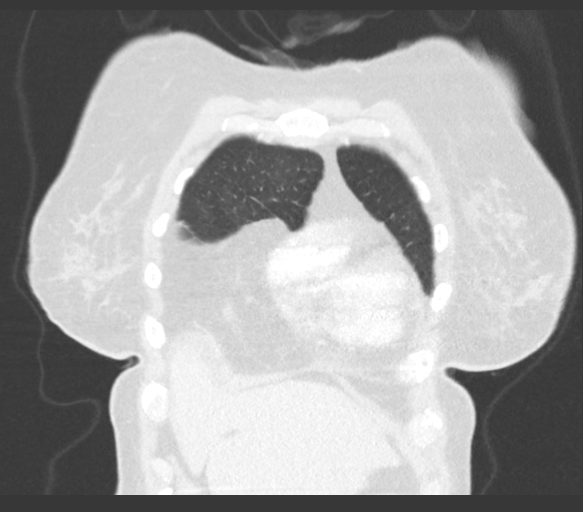
[im 50/125  lung]
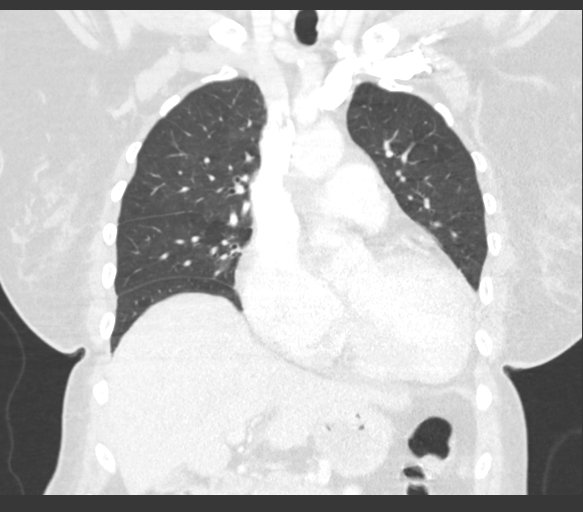
[im 75/125  lung]
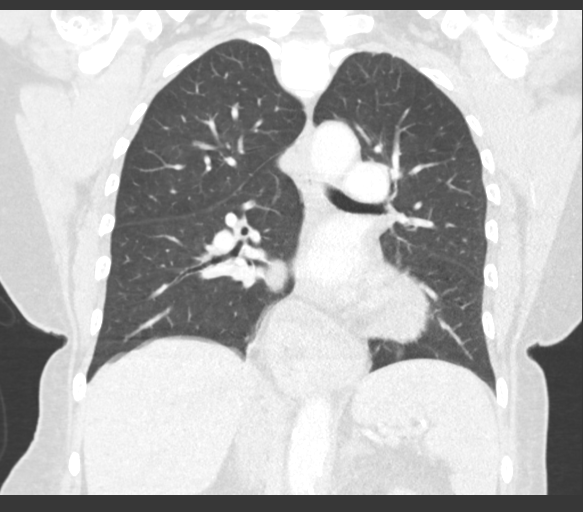

[15 of 36 positions shown; findings below may reference images not displayed]

FINDINGS: Cardiovascular: Heart is mildly enlarged. Small pericardial
effusion. Aorta and main pulmonary artery normal in caliber. Within
the left lower lobe segmental pulmonary artery there is a filling
defect (image 79; series 2) most compatible with pulmonary embolus.

Mediastinum/Nodes: Moderate-sized hiatal hernia. No axillary,
mediastinal or hilar lymphadenopathy.

Lungs/Pleura: Central airways are patent. Dependent atelectasis
within the bilateral lower lobes. No large area pulmonary
consolidation. No pleural effusion or pneumothorax.

Upper Abdomen: No acute process.

Musculoskeletal: Thoracic spine degenerative changes. No aggressive
or acute appearing osseous lesions.
IMPRESSION: 1. There is a an acute segmental pulmonary embolus within the left
lower lobe pulmonary arteries.
2. No large area of pulmonary consolidation.
3. No mediastinal or hilar mass.

Critical Value/emergent results were called by telephone at the time
of interpretation on 02/03/2018 at [DATE] to MOHOMED FIFI , who
verbally acknowledged these results.

## 2020-04-03 ENCOUNTER — Encounter: Payer: Self-pay | Admitting: Adult Health Nurse Practitioner

## 2020-04-03 ENCOUNTER — Other Ambulatory Visit: Payer: Self-pay

## 2020-04-03 ENCOUNTER — Ambulatory Visit (INDEPENDENT_AMBULATORY_CARE_PROVIDER_SITE_OTHER): Payer: PPO | Admitting: Adult Health Nurse Practitioner

## 2020-04-03 VITALS — BP 120/84 | HR 103 | Temp 97.2°F | Ht 64.0 in | Wt 171.0 lb

## 2020-04-03 DIAGNOSIS — R6889 Other general symptoms and signs: Secondary | ICD-10-CM

## 2020-04-03 DIAGNOSIS — E663 Overweight: Secondary | ICD-10-CM

## 2020-04-03 DIAGNOSIS — D692 Other nonthrombocytopenic purpura: Secondary | ICD-10-CM | POA: Diagnosis not present

## 2020-04-03 DIAGNOSIS — Z86711 Personal history of pulmonary embolism: Secondary | ICD-10-CM

## 2020-04-03 DIAGNOSIS — Z862 Personal history of diseases of the blood and blood-forming organs and certain disorders involving the immune mechanism: Secondary | ICD-10-CM | POA: Diagnosis not present

## 2020-04-03 DIAGNOSIS — D6859 Other primary thrombophilia: Secondary | ICD-10-CM

## 2020-04-03 DIAGNOSIS — I480 Paroxysmal atrial fibrillation: Secondary | ICD-10-CM | POA: Diagnosis not present

## 2020-04-03 DIAGNOSIS — R42 Dizziness and giddiness: Secondary | ICD-10-CM | POA: Diagnosis not present

## 2020-04-03 DIAGNOSIS — I10 Essential (primary) hypertension: Secondary | ICD-10-CM

## 2020-04-03 DIAGNOSIS — D649 Anemia, unspecified: Secondary | ICD-10-CM | POA: Diagnosis not present

## 2020-04-03 DIAGNOSIS — Z0001 Encounter for general adult medical examination with abnormal findings: Secondary | ICD-10-CM | POA: Diagnosis not present

## 2020-04-03 DIAGNOSIS — Z0000 Encounter for general adult medical examination without abnormal findings: Secondary | ICD-10-CM

## 2020-04-03 DIAGNOSIS — S069XAS Dizziness and giddiness: Secondary | ICD-10-CM

## 2020-04-03 DIAGNOSIS — K219 Gastro-esophageal reflux disease without esophagitis: Secondary | ICD-10-CM

## 2020-04-03 DIAGNOSIS — R7309 Other abnormal glucose: Secondary | ICD-10-CM | POA: Diagnosis not present

## 2020-04-03 DIAGNOSIS — I83813 Varicose veins of bilateral lower extremities with pain: Secondary | ICD-10-CM | POA: Diagnosis not present

## 2020-04-03 DIAGNOSIS — E559 Vitamin D deficiency, unspecified: Secondary | ICD-10-CM

## 2020-04-03 DIAGNOSIS — S069XAA Unspecified intracranial injury with loss of consciousness status unknown, initial encounter: Secondary | ICD-10-CM

## 2020-04-03 DIAGNOSIS — E782 Mixed hyperlipidemia: Secondary | ICD-10-CM

## 2020-04-03 MED ORDER — PANTOPRAZOLE SODIUM 40 MG PO TBEC: DELAYED_RELEASE_TABLET | ORAL | 0 refills | Status: DC

## 2020-04-03 MED ORDER — APIXABAN 5 MG PO TABS: 5.0000 mg | ORAL_TABLET | Freq: Two times a day (BID) | ORAL | 3 refills | Status: DC

## 2020-04-03 MED ORDER — METOPROLOL TARTRATE 25 MG PO TABS: ORAL_TABLET | ORAL | 0 refills | Status: DC

## 2020-04-03 NOTE — Progress Notes (Incomplete)
MEDICARE ANNUAL WELLNESS VISIT AND 3 MONTH FOLLOW UP  Assessment:   Encounter for Medicare annual wellness exam 1 YEAR  Essential hypertension - continue medications, DASH diet, exercise and monitor at home. Call if greater than 130/80.  -     CBC with Differential/Platelet -     COMPLETE METABOLIC PANEL WITH GFR -     TSH  Chronic atrial fibrillation ([name])/ thrombophilia ([name]) rate controlled, on NOAC for chadsvasc of 4, and metoprolol  Dr. Einar Gip following  History of PE On elequis (restart after surgery); continue due to a. 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   Medication management  Iron deficiency due to chronic blood loss  Monitor CBC, iron panel; anticipate chronic iron replacement needs secondary to esophagitis and cameron's lesion  Abnormal glucose Monitor  Vitamin D deficiency Continue supplement   Overweight  - long discussion about weight loss, diet, and exercise -recommended diet heavy in fruits and veggies and low in animal meats, cheeses, and dairy products  Senile purpura ([place]) R/t elequis and age; Discussed process, protect skin, sunscreen  Varicose veins - weight loss discussed, continue compression stockings and elevation  GERD/cameron lesions Anticipate PPI and iron supplement indefinitely per GI Monitor magnesium, vitamin D Avoid NSAIDs  Goals of care Patient declines most exam/vaccines- states want to enjoy what times he has and would declines any treatments. Advanced directive/living will paperwork provided today.   Over 40 minutes of face to face interview, exam, counseling, chart review and critical decision making was performed Future Appointments  Date Time Provider [place]  04/22/2020 11:30 AM Garnet Sierras, NP [facility] None  07/02/2020  2:00 PM Unk Pinto, MD  [facility] None  09/24/2020 11:30 AM [facility] 1 [facility] None  10/03/2020  1:00 PM Adrian Prows, MD [facility] None  04/07/2021 11:30 AM Garnet Sierras, NP [facility] None     Plan:   During the course of the visit the patient was educated and counseled about appropriate screening and preventive services including:    Pneumococcal vaccine   Prevnar 13  Influenza vaccine  Td vaccine  Screening electrocardiogram  Bone densitometry screening  Colorectal cancer screening  Diabetes screening  Glaucoma screening  Nutrition counseling   Advanced directives: requested   Subjective:  Lori FERRARE is a 80 y.o. female who presents for Medicare Annual Wellness Visit and 3 month follow up   Patient was hospitalized in Nov 2019  with a severe GI bleed (hgb 5 gm%) requiring  pRBC transfusions and found to have esophagitis, erosive gastropathy, Cameron lesion and a small colon polyp & was initiated on PPI. CBCs and iron levels have since resolved to normal range, continues on daily iron.   She takes CBD oil PRN anxiety and LE pain, uses valium rarelly, tylenol as well for pain with aleve occasionally and reports she is managing well for vertigo and anxiety.   Had OV 10/31/19 hospital follow up after hip replacement.  80 y.o.female presents for follow up for transition from recent hospitalization. Admit date to the hospital was 10/17/19, patient was discharged from the hospital on 10/18/19 and our clinical staff contacted the office the day after discharge to set up a follow up appointment. The discharge summary, medications, and diagnostic test results were reviewed before meeting with the patient. The patient was admitted for: Primary localized osteoarthritis of right hip with Total hip  arthroplasty, anterior approach.  She had pre-op antibiotics.  She is taking Eliquis for DVT prophalaxis, also Afib.Marland Kitchen  Her bASA is held at this time. Reports that she was to have PT, 4 sessions.  One week ago  Monday 10/23/19 she was to have her first appointment between 1-2pm.  She said she has been at home and she even waited around until after 3pm and no one showed up.  She then received a phone call the next day letting her know that since she wasn't home for her appointment yesterday they did not have an appointment for her to do the therapy.  She reports she was not happy about this but has been moving around her home with ease.  She takes Tylenol in the morning and at night.  She has been sleeping well and through the night.  Reports that they recommended compression stockings but she is not able to get them on or take them off related to arthritis.  She reports that she does not have any pain and it not taking any pain medication.  She reports some mild burning with the initiation of walking and it resolves after starting to walk around.  She is doing the exercises from the hospital daily and she walks around the home without any assistive devices.  She does use a cane when she leaves the home for stability. Reports some constipation after returning home but has since had regular bowel movements.  She is drinking about 3-4 glasses of water a day.  She reports she is has been keeping the bandage on her right hip dry.  She has been washing up.   Follow up appointment tomorrow with [facility]. Follow up n one year or PRN.     BMI is Body mass index is 29.35 kg/m., she has not been working on diet and exercise. Wt Readings from Last 3 Encounters:  04/03/20 171 lb (77.6 kg)  01/02/20 167 lb 12.8 oz (76.1 kg)  10/17/19 158 lb 3.2 oz (71.8 kg)   Her blood pressure has been controlled at home, today their BP is BP: 120/84  She does not workout but is very active with 35 dogs and goats. She denies chest pain, shortness of breath, dizziness. In 2009, she had a false (+) Myoview confirmed by a Negative Heart Cath.  She has history of Afib, CHA2DS2VASC 4, declined anticoag/NOAC for many years until recent PE  and now doing well on elequis, but is off due to upcoming hip replacement per discussion with Dr. Einar Gip.    She is not on cholesterol medication and denies myalgias. Her LDL cholesterol is at goal. The cholesterol last visit was:   Lab Results  Component Value Date   CHOL 155 01/02/2020   HDL 46 (L) 01/02/2020   LDLCALC 81 01/02/2020   TRIG 187 (H) 01/02/2020   CHOLHDL 3.4 01/02/2020    She has been working on diet and exercise for prediabetes, and denies polydipsia, polyuria and visual disturbances. Last A1C in the office was:  Lab Results  Component Value Date   HGBA1C 5.1 01/02/2020   Recently had episode of AKI that did improve with cessation of diuretic/NSAID and advised increased water, admits 2-3 bottles of water, 2 bottles of pepsi  Lab Results  Component Value Date   GFRNONAA 43 (L) 01/02/2020   GFRNONAA 55 (L) 10/02/2019   GFRNONAA 50 (L) 08/21/2019   Lab Results  Component Value Date   CREATININE 1.22 (H) 01/02/2020   CREATININE 0.99 (H)  10/02/2019   CREATININE 1.07 (H) 08/21/2019   Patient is on Vitamin D supplement.   Lab Results  Component Value Date   VD25OH 67 01/02/2020      Medication Review: Current Outpatient Medications on File Prior to Visit  Medication Sig Dispense Refill  . acetaminophen (TYLENOL) 500 MG tablet Take 1,000 mg by mouth every 6 (six) hours as needed for mild pain or moderate pain.    . baclofen (LIORESAL) 10 MG tablet Take 1 tablet (10 mg total) by mouth 2 (two) times daily. 60 tablet 1  . Cholecalciferol 25 MCG (1000 UT) capsule Take 1,000 Units by mouth daily.     . diazepam (VALIUM) 5 MG tablet Take 1/2 to 1 tablet 2 to 3 x /day for Dizziness / Vertigo 90 tablet 0  . ferrous sulfate 325 (65 FE) MG EC tablet Take 325 mg by mouth 3 (three) times a week.     . ondansetron (ZOFRAN) 4 MG tablet Take 1 tablet (4 mg total) by mouth every 8 (eight) hours as needed for nausea or vomiting. 20 tablet 0   No current facility-administered  medications on file prior to visit.    No Known Allergies  Current Problems (verified) Patient Active Problem List   Diagnosis Date Noted  . Primary localized osteoarthrosis of the hip, right 10/17/2019  . Primary localized osteoarthritis of right hip 09/26/2019  . Thrombophilia ([place]) 04/20/2019  . Spinal stenosis of lumbar region 04/20/2019  . Arthritis of both hands 04/20/2019  . Iron deficiency anemia due to chronic blood loss 03/08/2019  . Varicose vein of leg 09/01/2018  . Gastroesophageal reflux disease with esophagitis   . History of pulmonary embolism (01/2018) 02/04/2018  . Senile purpura ([place]) 07/20/2017  . Atrial fibrillation ([place]) 06/16/2017  . 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 Obesity (BMI 30.0-34.9) 02/06/2015  . Medication management 11/18/2013  . Hyperlipidemia, mixed   . Abnormal glucose   . Vitamin D deficiency   . Essential hypertension    Screening Tests Immunization History  Administered Date(s) Administered  . DT (Pediatric) 01/24/2014  . Pneumococcal Conjugate-13 05/26/2018  . Pneumococcal Polysaccharide-23 10/06/2011  . Td 04/23/2003   Preventative care: Last colonoscopy: 01/2018 no further follow up secondary to age per Dr. Fuller Plan Last EGD: 01/2018 - esophagitis, cameron lesions, anticipate chronic iron loss Last mammogram: 09/2012 - she states if she did have cancer she would not want treatment Last pap smear/pelvic exam: 2009 DEXA: 2011 normal, declines further  Prior vaccinations: TD  2015  Influenza: declines Pneumococcal: 2013 Prevnar13: 2020 Shingles/Zostavax: declines Covid 19: declines   Hep C: screening done today with patient permission  Names of Other Physician/Practitioners you currently use: 1. [facility] here for primary care 2. [place], eye doctor, last visit June 2020, has scheduled in 04/2020 3. Needs one, dentist, remote, encouraged to schedule   Patient Care  Team: Unk Pinto, MD as PCP - General (Internal Medicine) Unk Pinto, MD as PCP - Internal Medicine (Internal Medicine) Clent Jacks, MD as Consulting Physician (Ophthalmology) Ladene Artist, MD as Consulting Physician (Gastroenterology)  SURGICAL HISTORY She  has a past surgical history that includes Tubal ligation; Cerebral aneurysm repair; Cataract extraction w/ intraocular lens  implant, bilateral (2009); Foot surgery (1993); Colonoscopy (N/A, 02/05/2018); Esophagogastroduodenoscopy (N/A, 02/05/2018); biopsy (02/05/2018); polypectomy (02/05/2018); and Total hip arthroplasty (Right, 10/17/2019). FAMILY HISTORY Her family history includes Cancer in her father and mother; Diabetes in her mother; Hypertension in her mother.  SOCIAL HISTORY She  reports that she quit smoking about 29 years ago. Her smoking use included cigarettes. She has never used smokeless tobacco. She reports that she does not drink alcohol and does not use drugs.   MEDICARE WELLNESS OBJECTIVES: Physical activity:   Cardiac risk factors:   Depression/mood screen:   Depression screen [facility] 2/9 04/03/2020  Decreased Interest 0  Down, Depressed, Hopeless 0  PHQ - 2 Score 0    ADLs:  In your present state of health, do you have any difficulty performing the following activities: 04/03/2020 10/17/2019  Hearing? Y Y  Comment - -  Vision? N N  Difficulty concentrating or making decisions? N N  Walking or climbing stairs? Y Y  Comment ambulates with cane -  Dressing or bathing? N N  Doing errands, shopping? N N  Preparing Food and eating ? N -  Using the Toilet? N -  In the past six months, have you accidently leaked urine? N -  Do you have problems with loss of bowel control? N -  Managing your Medications? N -  Managing your Finances? N -  Housekeeping or managing your Housekeeping? N -  Some recent data might be hidden     Cognitive Testing  Alert? Yes  Normal Appearance?Yes  Oriented to person? Yes   Place? Yes   Time? Yes  Recall of three objects?  Yes  Can perform simple calculations? Yes  Displays appropriate judgment?Yes  Can read the correct time from a watch face?Yes  EOL planning: Does Patient Have a Medical Advance Directive?: No Would patient like information on creating a medical advance directive?: No - Patient declined  Review of Systems  Constitutional: Negative.  Negative for malaise/fatigue and weight loss.  HENT: Negative.  Negative for hearing loss and tinnitus.   Eyes: Negative.  Negative for blurred vision and double vision.  Respiratory: Negative.  Negative for cough, sputum production, shortness of breath and wheezing.   Cardiovascular: Negative.  Negative for chest pain, palpitations, orthopnea, claudication, leg swelling and PND.  Gastrointestinal: Negative.  Negative for abdominal pain, blood in stool, constipation, diarrhea, heartburn, melena, nausea and vomiting.  Genitourinary: Negative.   Musculoskeletal: Positive for joint pain (R hip, bil wrists using canes). Negative for falls and myalgias.  Skin: Negative.  Negative for rash.  Neurological: Negative for dizziness, tingling, sensory change, weakness and headaches.  Endo/Heme/Allergies: Negative for polydipsia.  Psychiatric/Behavioral: Negative.  Negative for depression, memory loss, substance abuse and suicidal ideas. The patient is not nervous/anxious and does not have insomnia.   All other systems reviewed and are negative.    Objective:     Today's Vitals   04/03/20 1137  BP: 120/84  Pulse: (!) 103  Temp: (!) 97.2 F (36.2 C)  SpO2: 97%  Weight: 171 lb (77.6 kg)  Height: 5\' 4"  (1.626 m)   Body mass index is 29.35 kg/m.  General appearance: alert, no distress, WD/WN, female HEENT: normocephalic, sclerae anicteric, TMs pearly, nares patent, no discharge or erythema, pharynx normal Oral cavity: MMM, no lesions Neck: supple, no lymphadenopathy, no thyromegaly, no masses Heart: RRR,  normal S1, S2, no murmurs Lungs: CTA bilaterally, no wheezes, rhonchi, or rales Abdomen: +bs, soft, non tender, non distended, no masses, no hepatomegaly, no splenomegaly Musculoskeletal: nontender, no swelling, very slow sitting to standing, slow antalgic gait, bil wrists with bony enlargement without effusion Extremities: no edema, no cyanosis, no clubbing, numerous superficial varicose veins of bilateral lower legs Pulses: 2+ symmetric, upper and  lower extremities, normal cap refill Neurological: alert, oriented x 3, CN2-12 intact, strength normal upper extremities and lower extremities, sensation normal throughout, DTRs 2+ throughout, no cerebellar signs, gait antalgic Psychiatric: normal affect, behavior normal, pleasant   Medicare Attestation I have personally reviewed: The patient's medical and social history Their use of alcohol, tobacco or illicit drugs Their current medications and supplements The patient's functional ability including ADLs,fall risks, home safety risks, cognitive, and hearing and visual impairment Diet and physical activities Evidence for depression or mood disorders  The patient's weight, height, BMI, and visual acuity have been recorded in the chart.  I have made referrals, counseling, and provided education to the patient based on review of the above and I have provided the patient with a written personalized care plan for preventive services.     Garnet Sierras, NP   04/03/2020

## 2020-04-03 NOTE — Progress Notes (Signed)
MEDICARE ANNUAL WELLNESS VISIT AND 3 MONTH FOLLOW UP  Assessment:   Encounter for Medicare annual wellness exam 1 YEAR  Essential hypertension - continue medications, DASH diet, exercise and monitor at home. Call if greater than 130/80.  -     CBC with Differential/Platelet -     COMPLETE METABOLIC PANEL WITH GFR -     TSH  Chronic atrial fibrillation ([name])/ thrombophilia ([name]) rate controlled, on NOAC for chadsvasc of 4, and metoprolol  Dr. Einar Gip following  History of PE On elequis continue due to a. 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   Iron deficiency due to chronic blood loss  Monitor CBC, iron panel; anticipate chronic iron replacement needs secondary to esophagitis and cameron's lesion  Abnormal glucose Monitor  Vitamin D deficiency Continue supplement   Overweight  - long discussion about weight loss, diet, and exercise -recommended diet heavy in fruits and veggies and low in animal meats, cheeses, and dairy products  Senile purpura ([name]) R/t eliquis and age; Discussed process, protect skin, sunscreen  Varicose veins - weight loss discussed, continue compression stockings and elevation  GERD/cameron lesions Anticipate PPI and iron supplement indefinitely per GI Monitor magnesium, vitamin D Avoid NSAIDs  Medication Management Continued  Over 40 minutes of face to face interview, exam, counseling, chart review and critical decision making was performed Future Appointments  Date Time Provider [place]  04/22/2020 11:30 AM Garnet Sierras, NP [facility] None  07/02/2020  2:00 PM Unk Pinto, MD [facility] None  09/24/2020 11:30 AM [facility]-ECHO/VAS 1 [facility] None  10/03/2020  1:00 PM Adrian Prows, MD [facility] None  04/07/2021 11:30 AM Garnet Sierras, NP [facility] None     Plan:   During the course of the  visit the patient was educated and counseled about appropriate screening and preventive services including:    Pneumococcal vaccine   Prevnar 13  Influenza vaccine  Td vaccine  Screening electrocardiogram  Bone densitometry screening  Colorectal cancer screening  Diabetes screening  Glaucoma screening  Nutrition counseling   Advanced directives: requested   Subjective:  Lori [name] is a 80 y.o. female who presents for Medicare Annual Wellness Visit and 3 month follow up  She reports over all she is doing well.  She s/p TRH 10/17/19 and reports she is doing very well, she completed PT.  She ambulates with a cane.  Reports she is having some sciatic pains now on left side and working to improve this with Ortho, Dr Percell Miller. Patient was hospitalized in Nov 2019  with a severe GI bleed (hgb 5 gm%) requiring  pRBC transfusions and found to have esophagitis, erosive gastropathy, Cameron lesion and a small colon polyp & was initiated on PPI. CBCs and iron levels have since resolved to normal range, continues on daily iron.   She takes CBD oil PRN anxiety and LE pain, uses valium rarelly, tylenol as well for pain with aleve occasionally and reports she is managing well for vertigo and anxiety.   Had OV 10/31/19 hospital follow up after hip replacement.  80 y.o.female presents for follow up for transition from recent hospitalization. Admit date to the hospital was 10/17/19, patient was discharged from the hospital on 10/18/19 and our clinical staff contacted the office the day after discharge to set up a follow up appointment. The discharge summary, medications, and diagnostic test results were reviewed  before meeting with the patient. The patient was admitted for: Primary localized osteoarthritis of right hip with Total hip arthroplasty, anterior approach.  She had pre-op antibiotics.  She is taking Eliquis for DVT prophalaxis, also Afib.Marland Kitchen  Her bASA is held at this time. Reports that she was to  have PT, 4 sessions.  One week ago Monday 10/23/19 she was to have her first appointment between 1-2pm.  She said she has been at home and she even waited around until after 3pm and no one showed up.  She then received a phone call the next day letting her know that since she wasn't home for her appointment yesterday they did not have an appointment for her to do the therapy.  She reports she was not happy about this but has been moving around her home with ease.  She takes Tylenol in the morning and at night.  She has been sleeping well and through the night.  Reports that they recommended compression stockings but she is not able to get them on or take them off related to arthritis.  She reports that she does not have any pain and it not taking any pain medication.  She reports some mild burning with the initiation of walking and it resolves after starting to walk around.  She is doing the exercises from the hospital daily and she walks around the home without any assistive devices.  She does use a cane when she leaves the home for stability. Reports some constipation after returning home but has since had regular bowel movements.  She is drinking about 3-4 glasses of water a day.  She reports she is has been keeping the bandage on her right hip dry.  She has been washing up.   Follow up appointment tomorrow with [facility].  No Driving for 6 weeks.  Home health is involved, PT with a total of four visits.    BMI is Body mass index is 29.35 kg/m., she has not been working on diet and exercise. Wt Readings from Last 3 Encounters:  04/03/20 171 lb (77.6 kg)  01/02/20 167 lb 12.8 oz (76.1 kg)  10/17/19 158 lb 3.2 oz (71.8 kg)   Her blood pressure has been controlled at home, today their BP is BP: 120/84  She does not workout but is very active with 35 dogs and goats. She denies chest pain, shortness of breath, dizziness. In 2009, she had a false (+) Myoview confirmed by a Negative Heart Cath. Last  Echo 09/13/19, EF 69% Conclusion 1. Normal LV systolic function with EF 69%. Left ventricle cavity is normal in size. Moderate concentric hypertrophy of the left ventricle. Normal global wall motion. Doppler evidence of grade II (pseudonormal) diastolic dysfunction, elevated LAP. Calculated EF 69%. 2. Left atrial cavity is severely dilated by volume. 3. Right atrial cavity is moderately dilated. 4. Right ventricle cavity is mildly dilated. Mild concentric hypertrophy of the right ventricle. Normal right ventricular function. 5. Structurally normal tricuspid valve. Moderate to severe tricuspid regurgitation. Moderate pulmonary hypertension. RVSP measures 59 mmHg. 6. Insignificant pericardial effusion with clear fluids. 7. IVC is dilated with blunted respiratory response. CVP estimated at 15 mm Hg. 8. No significant change from 02/04/2018 report. Conclusions: Adrian Prows, MD [facility] September 18, 2019 12:33 PM EDT  She has history of Afib, CHA2DS2VASC 4, declined anticoag/NOAC for many years until recent PE and now doing well on eliquis.   She is not on cholesterol medication and denies myalgias. Her LDL cholesterol is at  goal. The cholesterol last visit was:   Lab Results  Component Value Date   CHOL 170 04/03/2020   HDL 51 04/03/2020   LDLCALC 93 04/03/2020   TRIG 164 (H) 04/03/2020   CHOLHDL 3.3 04/03/2020    She has been working on diet and exercise for prediabetes, and denies polydipsia, polyuria and visual disturbances. Last A1C in the office was:  Lab Results  Component Value Date   HGBA1C 5.1 01/02/2020   Recently had episode of AKI that did improve with cessation of diuretic/NSAID and advised increased water, admits 2-3 bottles of water, 2 bottles of pepsi  Lab Results  Component Value Date   GFRNONAA 52 (L) 04/03/2020   GFRNONAA 43 (L) 01/02/2020   GFRNONAA 55 (L) 10/02/2019   Lab Results  Component Value Date   CREATININE 1.03 (H) 04/03/2020   CREATININE 1.22 (H)  01/02/2020   CREATININE 0.99 (H) 10/02/2019   Patient is on Vitamin D supplement.   Lab Results  Component Value Date   VD25OH 67 01/02/2020      Blood pressure 120/84, pulse (!) 103, temperature (!) 97.2 F (36.2 C), height 5\' 4"  (1.626 m), weight 171 lb (77.6 kg), SpO2 97 %.  Medications   Current Outpatient Medications (Cardiovascular):    metoprolol tartrate (LOPRESSOR) 25 MG tablet, Take     1 tablet    Daily    for BP & Heart   Current Outpatient Medications (Analgesics):    acetaminophen (TYLENOL) 500 MG tablet, Take 1,000 mg by mouth every 6 (six) hours as needed for mild pain or moderate pain.  Current Outpatient Medications (Hematological):    ferrous sulfate 325 (65 FE) MG EC tablet, Take 325 mg by mouth 3 (three) times a week.    apixaban (ELIQUIS) 5 MG TABS tablet, Take 1 tablet (5 mg total) by mouth 2 (two) times daily. Start Post hip replacement. Stop Aspirin after starting Eliquis  Current Outpatient Medications (Other):    baclofen (LIORESAL) 10 MG tablet, Take 1 tablet (10 mg total) by mouth 2 (two) times daily.   Cholecalciferol 25 MCG (1000 UT) capsule, Take 1,000 Units by mouth daily.    diazepam (VALIUM) 5 MG tablet, Take 1/2 to 1 tablet 2 to 3 x /day for Dizziness / Vertigo   ondansetron (ZOFRAN) 4 MG tablet, Take 1 tablet (4 mg total) by mouth every 8 (eight) hours as needed for nausea or vomiting.   pantoprazole (PROTONIX) 40 MG tablet, Take      1 tablet     Daily      to Prevent Heartburn & Indigestion   No Known Allergies  Current Problems (verified) Patient Active Problem List   Diagnosis Date Noted   Primary localized osteoarthrosis of the hip, right 10/17/2019   Primary localized osteoarthritis of right hip 09/26/2019   Thrombophilia ([place]) 04/20/2019   Spinal stenosis of lumbar region 04/20/2019   Arthritis of both hands 04/20/2019   Iron deficiency anemia due to chronic blood loss 03/08/2019   Varicose vein of leg 09/01/2018    Gastroesophageal reflux disease with esophagitis    History of pulmonary embolism (01/2018) 02/04/2018   Senile purpura ([place]) 07/20/2017   Atrial fibrillation ([place]) 06/16/2017   Vertigo due to brain injury ([place]) 02/06/2015   Obesity (BMI 30.0-34.9) 02/06/2015   Medication management 11/18/2013   Hyperlipidemia, mixed    Abnormal glucose    Vitamin D deficiency    Essential hypertension    Screening Tests Immunization History  Administered Date(s)  Administered   DT (Pediatric) 01/24/2014   Pneumococcal Conjugate-13 05/26/2018   Pneumococcal Polysaccharide-23 10/06/2011   Td 04/23/2003   Preventative care: Last colonoscopy: 01/2018 no further follow up secondary to age per Dr. Fuller Plan Last EGD: 01/2018 - esophagitis, cameron lesions, anticipate chronic iron loss Last mammogram: 09/2012 - she states if she did have cancer she would not want treatment Last pap smear/pelvic exam: 2009 DEXA: 2011 normal, declines further  Prior vaccinations: TD  2015  Influenza: declines Pneumococcal: 2013 Prevnar13: 2020 Shingles/Zostavax: declines Covid 19: declines   Hep C: screening done today with patient permission  Names of Other Physician/Practitioners you currently use: 1. [facility] here for primary care 2. [place], eye doctor, last visit June 2020, has scheduled in 04/2020 3. Needs one, dentist, remote, encouraged to schedule   Patient Care Team: Unk Pinto, MD as PCP - General (Internal Medicine) Unk Pinto, MD as PCP - Internal Medicine (Internal Medicine) Clent Jacks, MD as Consulting Physician (Ophthalmology) Ladene Artist, MD as Consulting Physician (Gastroenterology)  SURGICAL HISTORY She  has a past surgical history that includes Tubal ligation; Cerebral aneurysm repair; Cataract extraction w/ intraocular lens  implant, bilateral (2009); Foot surgery (1993); Colonoscopy (N/A, 02/05/2018);  Esophagogastroduodenoscopy (N/A, 02/05/2018); biopsy (02/05/2018); polypectomy (02/05/2018); and Total hip arthroplasty (Right, 10/17/2019). FAMILY HISTORY Her family history includes Cancer in her father and mother; Diabetes in her mother; Hypertension in her mother. SOCIAL HISTORY She  reports that she quit smoking about 29 years ago. Her smoking use included cigarettes. She has never used smokeless tobacco. She reports that she does not drink alcohol and does not use drugs.   MEDICARE WELLNESS OBJECTIVES: Physical activity: Current Exercise Habits: Home exercise routine, Type of exercise: walking;stretching, Time (Minutes): 20, Frequency (Times/Week): 5, Weekly Exercise (Minutes/Week): 100, Intensity: Mild, Exercise limited by: orthopedic condition(s) Cardiac risk factors: Cardiac Risk Factors include: advanced age (>32men, >69 women);dyslipidemia;sedentary lifestyle;smoking/ tobacco exposure;hypertension Depression/mood screen:   Depression screen [facility] 2/9 04/03/2020  Decreased Interest 0  Down, Depressed, Hopeless 0  PHQ - 2 Score 0    ADLs:  In your present state of health, do you have any difficulty performing the following activities: 04/03/2020 10/17/2019  Hearing? Y Y  Comment - -  Vision? N N  Difficulty concentrating or making decisions? N N  Walking or climbing stairs? Y Y  Comment ambulates with cane -  Dressing or bathing? N N  Doing errands, shopping? N N  Preparing Food and eating ? N -  Using the Toilet? N -  In the past six months, have you accidently leaked urine? N -  Do you have problems with loss of bowel control? N -  Managing your Medications? N -  Managing your Finances? N -  Housekeeping or managing your Housekeeping? N -  Some recent data might be hidden     Cognitive Testing  Alert? Yes  Normal Appearance?Yes  Oriented to person? Yes  Place? Yes   Time? Yes  Recall of three objects?  Yes  Can perform simple calculations? Yes  Displays appropriate  judgment?Yes  Can read the correct time from a watch face?Yes  EOL planning: Does Patient Have a Medical Advance Directive?: No Would patient like information on creating a medical advance directive?: No - Patient declined  Review of Systems  Constitutional: Negative.  Negative for malaise/fatigue and weight loss.  HENT: Negative.  Negative for hearing loss and tinnitus.   Eyes: Negative.  Negative for blurred vision and  double vision.  Respiratory: Negative.  Negative for cough, sputum production, shortness of breath and wheezing.   Cardiovascular: Negative.  Negative for chest pain, palpitations, orthopnea, claudication, leg swelling and PND.  Gastrointestinal: Negative.  Negative for abdominal pain, blood in stool, constipation, diarrhea, heartburn, melena, nausea and vomiting.  Genitourinary: Negative.   Musculoskeletal: Positive for joint pain (Left hip). Negative for falls and myalgias.  Skin: Negative.  Negative for rash.  Neurological: Negative for dizziness, tingling, sensory change, weakness and headaches.  Endo/Heme/Allergies: Negative for polydipsia.  Psychiatric/Behavioral: Negative.  Negative for depression, memory loss, substance abuse and suicidal ideas. The patient is not nervous/anxious and does not have insomnia.   All other systems reviewed and are negative.    Objective:     Today's Vitals   04/03/20 1137  BP: 120/84  Pulse: (!) 103  Temp: (!) 97.2 F (36.2 C)  SpO2: 97%  Weight: 171 lb (77.6 kg)  Height: 5\' 4"  (1.626 m)   Body mass index is 29.35 kg/m.  General appearance: alert, no distress, WD/WN, female HEENT: normocephalic, sclerae anicteric, TMs pearly, nares patent, no discharge or erythema, pharynx normal Oral cavity: MMM, no lesions Neck: supple, no lymphadenopathy, no thyromegaly, no masses Heart: RRR, normal S1, S2, no murmurs Lungs: CTA bilaterally, no wheezes, rhonchi, or rales Abdomen: +bs, soft, non tender, non distended, no masses, no  hepatomegaly, no splenomegaly Musculoskeletal: nontender, no swelling, very slow sitting to standing, slow antalgic gait, bil wrists with bony enlargement without effusion Extremities: no edema, no cyanosis, no clubbing, numerous superficial varicose veins of bilateral lower legs Pulses: 2+ symmetric, upper and lower extremities, normal cap refill Neurological: alert, oriented x 3, CN2-12 intact, strength normal upper extremities and lower extremities, sensation normal throughout, DTRs 2+ throughout, no cerebellar signs, gait antalgic Psychiatric: normal affect, behavior normal, pleasant   Medicare Attestation I have personally reviewed: The patient's medical and social history Their use of alcohol, tobacco or illicit drugs Their current medications and supplements The patient's functional ability including ADLs,fall risks, home safety risks, cognitive, and hearing and visual impairment Diet and physical activities Evidence for depression or mood disorders  The patient's weight, height, BMI, and visual acuity have been recorded in the chart.  I have made referrals, counseling, and provided education to the patient based on review of the above and I have provided the patient with a written personalized care plan for preventive services.     Garnet Sierras, NP   04/03/2020

## 2020-04-04 LAB — COMPLETE METABOLIC PANEL WITH GFR
AG Ratio: 2 (calc) (ref 1.0–2.5)
ALT: 10 U/L (ref 6–29)
AST: 15 U/L (ref 10–35)
Albumin: 4.3 g/dL (ref 3.6–5.1)
Alkaline phosphatase (APISO): 100 U/L (ref 37–153)
BUN/Creatinine Ratio: 17 (calc) (ref 6–22)
BUN: 18 mg/dL (ref 7–25)
CO2: 29 mmol/L (ref 20–32)
Calcium: 9.5 mg/dL (ref 8.6–10.4)
Chloride: 106 mmol/L (ref 98–110)
Creat: 1.03 mg/dL — ABNORMAL HIGH (ref 0.60–0.93)
GFR, Est African American: 61 mL/min/{1.73_m2} (ref >=60)
GFR, Est Non African American: 52 mL/min/{1.73_m2} — ABNORMAL LOW (ref >=60)
Globulin: 2.1 g/dL (calc) (ref 1.9–3.7)
Glucose, Bld: 165 mg/dL — ABNORMAL HIGH (ref 65–99)
Potassium: 4.2 mmol/L (ref 3.5–5.3)
Sodium: 143 mmol/L (ref 135–146)
Total Bilirubin: 0.4 mg/dL (ref 0.2–1.2)
Total Protein: 6.4 g/dL (ref 6.1–8.1)

## 2020-04-04 LAB — CBC WITH DIFFERENTIAL/PLATELET
Absolute Monocytes: 449 cells/uL (ref 200–950)
Basophils Absolute: 41 cells/uL (ref 0–200)
Basophils Relative: 0.6 %
Eosinophils Absolute: 41 cells/uL (ref 15–500)
Eosinophils Relative: 0.6 %
HCT: 42.7 % (ref 35.0–45.0)
Hemoglobin: 14.2 g/dL (ref 11.7–15.5)
Lymphs Abs: 1090 cells/uL (ref 850–3900)
MCH: 29.9 pg (ref 27.0–33.0)
MCHC: 33.3 g/dL (ref 32.0–36.0)
MCV: 89.9 fL (ref 80.0–100.0)
MPV: 13.4 fL — ABNORMAL HIGH (ref 7.5–12.5)
Monocytes Relative: 6.5 %
Neutro Abs: 5279 cells/uL (ref 1500–7800)
Neutrophils Relative %: 76.5 %
Platelets: 213 10*3/uL (ref 140–400)
RBC: 4.75 10*6/uL (ref 3.80–5.10)
RDW: 13.7 % (ref 11.0–15.0)
Total Lymphocyte: 15.8 %
WBC: 6.9 10*3/uL (ref 3.8–10.8)

## 2020-04-04 LAB — LIPID PANEL
Cholesterol: 170 mg/dL (ref <200)
HDL: 51 mg/dL (ref >=50)
LDL Cholesterol (Calc): 93 mg/dL (calc)
Non-HDL Cholesterol (Calc): 119 mg/dL (calc) (ref <130)
Total CHOL/HDL Ratio: 3.3 (calc) (ref <5.0)
Triglycerides: 164 mg/dL — ABNORMAL HIGH (ref <150)

## 2020-04-22 ENCOUNTER — Other Ambulatory Visit: Payer: Self-pay

## 2020-04-22 ENCOUNTER — Encounter: Payer: Self-pay | Admitting: Adult Health Nurse Practitioner

## 2020-04-22 ENCOUNTER — Ambulatory Visit (INDEPENDENT_AMBULATORY_CARE_PROVIDER_SITE_OTHER): Payer: PPO | Admitting: Adult Health Nurse Practitioner

## 2020-04-22 VITALS — BP 130/80 | HR 75 | Temp 97.5°F | Ht 64.0 in | Wt 175.8 lb

## 2020-04-22 DIAGNOSIS — L570 Actinic keratosis: Secondary | ICD-10-CM | POA: Diagnosis not present

## 2020-04-22 DIAGNOSIS — I10 Essential (primary) hypertension: Secondary | ICD-10-CM | POA: Diagnosis not present

## 2020-04-22 DIAGNOSIS — D489 Neoplasm of uncertain behavior, unspecified: Secondary | ICD-10-CM | POA: Diagnosis not present

## 2020-04-22 NOTE — Progress Notes (Signed)
Assessment and Plan:  Lori [name] was seen today for mole removal.  Diagnoses and all orders for this visit:  Actinic keratosis Cryo surgery Discussed care, instructions provided Continue to monitor  Neoplasm of uncertain behavior -     Dermatology pathology  Essential hypertension Continue current medications: Monitor blood pressure at home; call if consistently over 130/80 Continue DASH diet.   Reminder to go to the ER if any CP, SOB, nausea, dizziness, severe HA, changes vision/speech, left arm numbness and tingling and jaw pain.   Contact office with any new or worsening symptoms    Further disposition pending results of labs. Discussed med's effects and SE's.   Over 30 minutes of face to face interview, exam, counseling, chart review, and critical decision making was performed.   Future Appointments  Date Time Provider [place]  07/02/2020  2:00 PM Unk Pinto, MD [facility] None  09/24/2020 11:30 AM PCV-ECHO/VAS 1 [facility]IM[facility] None  10/03/2020  1:00 PM Adrian Prows, MD [facility] None  04/07/2021 11:30 AM Dalayah Deahl, Danton Sewer, NP [facility] None    ------------------------------------------------------------------------------------------------------------------   HPI 80 y.o.female presents for evaluation of mole to neck and also two spots on her face.  The area to her neck has been bothersome and getting caught on her shirts.  It is itchy at times and she scratches it until it bleeds.  She reports that it has been there as long as she can remember and denies any increase in size. She has and area to her left nasolabial fold that has darkened in color and the skin flakes at times.  Second area on her right forehead, dry raised, tender at times. Reports she has scratched it off but it grows back.  Past Medical History:  Diagnosis Date  . Anemia   . Arthritis   . Benign neoplasm of sigmoid colon   . Deafness    left ear per pt   . Dyspnea    pt reports with exertion due to  hip and knees   . Dysrhythmia    afib   . History of bleeding ulcers    2019 received 3-4 units of blood   . Hyperlipidemia   . Hypertension   . Pericardial effusion 02/04/2018  . Pre-diabetes    per Dr Einar Gip note of 09/28/19 pt denies at preop visit on 10/06/19   . Prediabetes   . Primary localized osteoarthritis of right hip 09/26/2019  . Pulmonary embolism ([place])    hx of 2019   . Vertigo   . Vitamin D deficiency      No Known Allergies  Current Outpatient Medications on File Prior to Visit  Medication Sig  . acetaminophen (TYLENOL) 500 MG tablet Take 1,000 mg by mouth every 6 (six) hours as needed for mild pain or moderate pain.  Marland Kitchen apixaban (ELIQUIS) 5 MG TABS tablet Take 1 tablet (5 mg total) by mouth 2 (two) times daily. Start Post hip replacement. Stop Aspirin after starting Eliquis  . baclofen (LIORESAL) 10 MG tablet Take 1 tablet (10 mg total) by mouth 2 (two) times daily.  . Cholecalciferol 25 MCG (1000 UT) capsule Take 1,000 Units by mouth daily.   . diazepam (VALIUM) 5 MG tablet Take 1/2 to 1 tablet 2 to 3 x /day for Dizziness / Vertigo  . ferrous sulfate 325 (65 FE) MG EC tablet Take 325 mg by mouth 3 (three) times a week.   . metoprolol tartrate (LOPRESSOR) 25 MG tablet Take     1 tablet  Daily    for BP & Heart  . ondansetron (ZOFRAN) 4 MG tablet Take 1 tablet (4 mg total) by mouth every 8 (eight) hours as needed for nausea or vomiting.  . pantoprazole (PROTONIX) 40 MG tablet Take      1 tablet     Daily      to Prevent Heartburn & Indigestion   No current facility-administered medications on file prior to visit.    ROS: all negative except above.   Physical Exam:  BP 130/80   Pulse 75   Temp (!) 97.5 F (36.4 C)   Ht 5\' 4"  (1.626 m)   Wt 175 lb 12.8 oz (79.7 kg)   SpO2 96%   BMI 30.18 kg/m   General Appearance: Well nourished, in no apparent distress. Eyes: PERRLA, EOMs, conjunctiva no swelling or erythema Respiratory: Respiratory effort normal, BS  equal bilaterally without rales, rhonchi, wheezing or stridor.  Cardio: RRR with no MRGs. Brisk peripheral pulses without edema.  Psych: Awake and oriented X 3, normal affect, Insight and Judgment appropriate.  Skin: Warm, dry without rashes, lesions, ecchymosis.  Chief Complaint: Patient presents for evaluation of skin lesions. Patient has erythematous, scaly lesions on face,  And single lesion on posterior neck changing in shape.   Exam: Seborrheic keratoses two to face and single neoplasm of uncertain behavior.  Procedure Details   The risks, benefits, indications, potential complications, and alternatives were explained to the patient and informed consent obtained.  Liquid nitrogen was use in a  Double  / triple freeze and thaw technique. The patient tolerated the procedure well.   Condition: Stable  Complications:  None  Diagnosis: Actinic Keratosis, irritated Neoplasm of unertain behavior  Procedure code:  [id number]  Plan: 1. Patient educated that the area will begin to heal in approximately a week.  2. Warning signs of infection were reviewed.    3. Recommended that the patient use OTC acetaminophen as needed for pain.       Garnet Sierras, Laqueta Jean, DNP [facility] 04/23/2020  1:07 PM

## 2020-04-23 DIAGNOSIS — C4441 Basal cell carcinoma of skin of scalp and neck: Secondary | ICD-10-CM | POA: Diagnosis not present

## 2020-05-10 ENCOUNTER — Telehealth: Payer: Self-pay

## 2020-05-10 NOTE — Telephone Encounter (Signed)
Patient called in order to ascertain what the next step was: skin cancer: BASEL cell  Patient would like a referral to [facility] in order to have them monitor yearly as requested by provider.

## 2020-05-13 ENCOUNTER — Other Ambulatory Visit: Payer: Self-pay | Admitting: Adult Health Nurse Practitioner

## 2020-05-13 ENCOUNTER — Encounter: Payer: Self-pay | Admitting: Adult Health Nurse Practitioner

## 2020-05-13 DIAGNOSIS — D489 Neoplasm of uncertain behavior, unspecified: Secondary | ICD-10-CM

## 2020-05-14 NOTE — Telephone Encounter (Signed)
Patient has been made aware & awaiting call from their office. FYI

## 2020-05-29 DIAGNOSIS — D485 Neoplasm of uncertain behavior of skin: Secondary | ICD-10-CM | POA: Diagnosis not present

## 2020-05-29 DIAGNOSIS — C44612 Basal cell carcinoma of skin of right upper limb, including shoulder: Secondary | ICD-10-CM | POA: Diagnosis not present

## 2020-05-29 DIAGNOSIS — L905 Scar conditions and fibrosis of skin: Secondary | ICD-10-CM | POA: Diagnosis not present

## 2020-05-29 DIAGNOSIS — L570 Actinic keratosis: Secondary | ICD-10-CM | POA: Diagnosis not present

## 2020-05-29 DIAGNOSIS — Z85828 Personal history of other malignant neoplasm of skin: Secondary | ICD-10-CM | POA: Diagnosis not present

## 2020-05-29 DIAGNOSIS — D1801 Hemangioma of skin and subcutaneous tissue: Secondary | ICD-10-CM | POA: Diagnosis not present

## 2020-06-25 DIAGNOSIS — C44612 Basal cell carcinoma of skin of right upper limb, including shoulder: Secondary | ICD-10-CM | POA: Diagnosis not present

## 2020-06-25 DIAGNOSIS — C4441 Basal cell carcinoma of skin of scalp and neck: Secondary | ICD-10-CM | POA: Diagnosis not present

## 2020-07-01 ENCOUNTER — Encounter: Payer: Self-pay | Admitting: Internal Medicine

## 2020-07-01 NOTE — Progress Notes (Signed)
Annual Screening/Preventative Visit & Comprehensive Evaluation &  Examination   Future Appointments  Date Time Provider Quinn  07/02/2020  2:00 PM Unk Pinto, MD [facility] None  09/24/2020 11:30 AM [facility]AS 1 [facility] None  10/03/2020  1:00 PM Adrian Prows, MD [facility] None  04/07/2021 11:30 AM Garnet Sierras, NP [facility] None  07/04/2021  9:00 AM Unk Pinto, MD [facility] None       This very nice 80 y.o. DWF presents for a Screening /Preventative Visit & comprehensive evaluation and management of multiple medical co-morbidities.  Patient has been followed for HTN, HLD, Prediabetes  and Vitamin D Deficiency. Patient has GERD controlled with Pantoprazole.    In August 2020, Lumbar MRI showed severe spinal stenosis &she was referred to Neurosurgery - Dr Ellene Route, who recommended conservative approach with EDSI's.        HTN predates circa 2009. She had a False (+) Myoview and aNegative Heart Cath in 2009 by Dr Einar Gip and in  2019 she was dx'd with Afib. PAfib.  Patient's BP has been controlled at home and patient denies any cardiac symptoms as chest pain, palpitations, shortness of breath, dizziness or ankle swelling. Today's BP: (!) 150/70       Patient's hyperlipidemia is controlled with diet and medications. Patient denies myalgias or other medication SE's. Last lipids were at goal:  Lab Results  Component Value Date   CHOL 170 04/03/2020   HDL 51 04/03/2020   LDLCALC 93 04/03/2020   TRIG 164 (H) 04/03/2020   CHOLHDL 3.3 04/03/2020        Patient has hx/o prediabetes (A1c 5.9% /2013)and patient denies reactive hypoglycemic symptoms, visual blurring, diabetic polys or paresthesias. Last A1c was normal & at goal:  Lab Results  Component Value Date   HGBA1C 5.1 01/02/2020        Finally, patient has history of Vitamin D Deficiency ("11" /2008) and last Vitamin D was at goal:  Lab Results  Component Value Date   VD25OH 67 01/02/2020    Current  Outpatient Medications on File Prior to Visit  Medication Sig  . acetaminophen   500 Mg Take 1,000 mg  every 6 hours as needed   . apixaban (ELIQUIS) 5 MG TABS  Take 1 tablet  2  times daily.   . Cholecalciferol 1000 u Take 1,000 Units  daily.   . diazepam  5 MG tablet Take 1/2 to 1 tablet 2 to 3 x /day for Dizziness /Vertigo  . ferrous sulfate 325  MG Take 325 mg by mouth 3 times a week.   . metoprolol tartrate  25 MG  Take     1 tablet    Daily  . pantoprazole  40 MG tablet Take      1 tablet     Daily      to Prevent Heartburn & Indigestion   No Known Allergies  Past Medical History:  Diagnosis Date  . Anemia   . Arthritis   . Benign neoplasm of sigmoid colon   . Deafness    left ear per pt   . Dyspnea    pt reports with exertion due to hip and knees   . Dysrhythmia    afib   . History of bleeding ulcers    2019 received 3-4 units of blood   . Hyperlipidemia   . Hypertension   . Pericardial effusion 02/04/2018  . Pre-diabetes    per Dr Einar Gip note of 09/28/19 pt denies at preop visit on  10/06/19   . Prediabetes   . Primary localized osteoarthritis of right hip 09/26/2019  . Pulmonary embolism ([place])    hx of 2019   . Vertigo   . Vitamin D deficiency     Health Maintenance  Topic Date Due  . COVID-19 Vaccine (1) Never done  . INFLUENZA VACCINE  10/14/2020  . TETANUS/TDAP  01/25/2024  . DEXA SCAN  Completed  . Hepatitis C Screening  Completed  . PNA vac Low Risk Adult  Completed  . HPV VACCINES  Aged Out    Immunization History  Administered Date(s) Administered  . DT (Pediatric) 01/24/2014  . Pneumococcal Conjugate-13 05/26/2018  . Pneumococcal Polysaccharide-23 10/06/2011  . Td 04/23/2003    LastEGD &Colon -02/05/2018 - Dr Fuller Plan - No f/u due to age.  Last MGM - 09/15/2012 - refuses further MGM.  Past Surgical History:  Procedure Laterality Date  . BIOPSY  02/05/2018   Procedure: BIOPSY;  Surgeon: Ladene Artist, MD;  Location: [facility];  Service:  Endoscopy;;  . CATARACT EXTRACTION W/ INTRAOCULAR LENS  IMPLANT, BILATERAL  2009  . CEREBRAL ANEURYSM REPAIR    . COLONOSCOPY N/A 02/05/2018   Procedure: COLONOSCOPY;  Surgeon: Ladene Artist, MD;  Location: [facility];  Service: Endoscopy;  Laterality: N/A;  . ESOPHAGOGASTRODUODENOSCOPY N/A 02/05/2018   Procedure: ESOPHAGOGASTRODUODENOSCOPY (EGD);  Surgeon: Ladene Artist, MD;  Location: [facility];  Service: Endoscopy;  Laterality: N/A;  . FOOT SURGERY  1993  . POLYPECTOMY  02/05/2018   Procedure: POLYPECTOMY;  Surgeon: Ladene Artist, MD;  Location: [facility];  Service: Endoscopy;;  . TOTAL HIP ARTHROPLASTY Right 10/17/2019   Procedure: TOTAL HIP ARTHROPLASTY ANTERIOR APPROACH;  Surgeon: Renette Butters, MD;  Location: [facility];  Service: Orthopedics;  Laterality: Right;  . TUBAL LIGATION      Family History  Problem Relation Age of Onset  . Hypertension Mother   . Diabetes Mother   . Cancer Mother        Pancreatic  . Cancer Father        Skin    Social History   Tobacco Use  . Smoking status: Former Smoker    Types: Cigarettes    Quit date: 08/07/1990    Years since quitting: 29.9  . Smokeless tobacco: Never Used  Vaping Use  . Vaping Use: Never used  Substance Use Topics  . Alcohol use: No    Alcohol/week: 0.0 standard drinks  . Drug use: No    ROS Constitutional: Denies fever, chills, weight loss/gain, headaches, insomnia,  night sweats, and change in appetite. Does c/o fatigue. Eyes: Denies redness, blurred vision, diplopia, discharge, itchy, watery eyes.  ENT: Denies discharge, congestion, post nasal drip, epistaxis, sore throat, earache, hearing loss, dental pain, Tinnitus, Vertigo, Sinus pain, snoring.  Cardio: Denies chest pain, palpitations, irregular heartbeat, syncope, dyspnea, diaphoresis, orthopnea, PND, claudication, edema Respiratory: denies cough, dyspnea, DOE, pleurisy, hoarseness, laryngitis, wheezing.  Gastrointestinal: Denies dysphagia,  heartburn, reflux, water brash, pain, cramps, nausea, vomiting, bloating, diarrhea, constipation, hematemesis, melena, hematochezia, jaundice, hemorrhoids Genitourinary: Denies dysuria, frequency, urgency, nocturia, hesitancy, discharge, hematuria, flank pain Breast: Breast lumps, nipple discharge, bleeding.  Musculoskeletal: Denies arthralgia, myalgia, stiffness, Jt. Swelling, pain, limp, and strain/sprain. Denies falls. Skin: Denies puritis, rash, hives, warts, acne, eczema, changing in skin lesion Neuro: No weakness, tremor, incoordination, spasms, paresthesia, pain Psychiatric: Denies confusion, memory loss, sensory loss. Denies Depression. Endocrine: Denies change in weight, skin, hair change, nocturia, and paresthesia, diabetic polys, visual blurring, hyper /  hypo glycemic episodes.  Heme/Lymph: No excessive bleeding, bruising, enlarged lymph nodes.  Physical Exam  BP (!) 150/70   Pulse 60   Temp 97.7 F (36.5 C)   Resp 16   Ht 5\' 4"  (1.626 m)   Wt 176 lb 6.4 oz (80 kg)   SpO2 98%   BMI 30.28 kg/m   General Appearance: Well nourished, well groomed and in no apparent distress.  Eyes: PERRLA, EOMs, conjunctiva no swelling or erythema, normal fundi and vessels. Sinuses: No frontal/maxillary tenderness ENT/Mouth: EACs patent / TMs  nl. Nares clear without erythema, swelling, mucoid exudates. Oral hygiene is good. No erythema, swelling, or exudate. Tongue normal, non-obstructing. Tonsils not swollen or erythematous. Hearing normal.  Neck: Supple, thyroid not palpable. No bruits, nodes or JVD. Respiratory: Respiratory effort normal.  BS equal and clear bilateral without rales, rhonci, wheezing or stridor. Cardio: Heart sounds are normal with regular rate and rhythm and no murmurs, rubs or gallops. Peripheral pulses are normal and equal bilaterally without edema. No aortic or femoral bruits. Chest: symmetric with normal excursions and percussion. Breasts: Symmetric, without lumps,  nipple discharge, retractions, or fibrocystic changes.  Abdomen: Flat, soft with bowel sounds active. Nontender, no guarding, rebound, hernias, masses, or organomegaly.  Lymphatics: Non tender without lymphadenopathy.  Musculoskeletal: Full ROM all peripheral extremities, joint stability, 5/5 strength, and normal gait. Skin: Warm and dry without rashes, lesions, cyanosis, clubbing or  ecchymosis.  Neuro: Cranial nerves intact, reflexes equal bilaterally. Normal muscle tone, no cerebellar symptoms. Sensation intact.  Pysch: Alert and oriented X 3, normal affect, Insight and Judgment appropriate.   Assessment and Plan  1. Annual Preventative Screening Examination   2. Essential hypertension  - EKG 12-Lead - Urinalysis, Routine w reflex microscopic - Microalbumin / creatinine urine ratio  3. Hyperlipidemia, mixed  - TSH  4. Abnormal glucose  - Hemoglobin A1c - Insulin, random  5. Vitamin D deficiency   6. Paroxysmal atrial fibrillation ([name])  - TSH  7. Thrombophilia ([name])  - CBC with Differential/Platelet  8. Gastroesophageal reflux disease  - CBC with Differential/Platelet  9. Screening for colorectal cancer  - POC Hemoccult Bld/Stl   10. Screening for ischemic heart disease  - EKG 12-Lead  11. FH: hypertension  - EKG 12-Lead  12. Former smoker  - EKG 12-Lead  13. Medication management  - Urinalysis, Routine w reflex microscopic - Microalbumin / creatinine urine ratio - CBC with Differential/Platelet - COMPLETE METABOLIC PANEL WITH GFR - Magnesium - Lipid panel - TSH - Hemoglobin A1c - Insulin, random - VITAMIN D 25 Hydroxy (  14. Paroxysmal atrial fibrillation ([name]) CHA2DS2-VASc Score is 4.   Yearly risk of stroke: 4% (A, HTN, F).     - TSH        Patient was counseled in prudent diet to achieve/maintain BMI less than 25 for weight control, BP monitoring, regular exercise and medications. Discussed med's effects and SE's. Screening labs and  tests as requested with regular follow-up as recommended. Over 40 minutes of exam, counseling, chart review and high complex critical decision making was performed.   Kirtland Bouchard, MD

## 2020-07-01 NOTE — Patient Instructions (Signed)
Due to recent changes in healthcare laws, you may see the results of your imaging and laboratory studies on MyChart before your provider has had a chance to review them.  We understand that in some cases there may be results that are confusing or concerning to you. Not all laboratory results come back in the same time frame and the provider may be waiting for multiple results in order to interpret others.  Please give [place] 48 hours in order for your provider to thoroughly review all the results before contacting the office for clarification of your results.   +++++++++++++++++++++++++  Vit D  & Vit C 1,000 mg   are recommended to help protect  against the Covid-19 and other Corona viruses.    Also it's recommended  to take  Zinc 50 mg  to help  protect against the Covid-19   and best place to get  is also on [email or web address]  and don't pay more than 6-8 cents /pill !  ================================ Coronavirus (COVID-19) Are you at risk?  Are you at risk for the Coronavirus (COVID-19)?  To be considered HIGH RISK for Coronavirus (COVID-19), you have to meet the following criteria:  . Traveled to [place], [place], [place], [place] or [place]; or in the [place] to [place], [place], [place]  . or [place]; and have fever, cough, and shortness of breath within the last 2 weeks of travel OR . Been in close contact with a person diagnosed with COVID-19 within the last 2 weeks and have  . fever, cough,and shortness of breath .  . IF YOU DO NOT MEET THESE CRITERIA, YOU ARE CONSIDERED LOW RISK FOR COVID-19.  What to do if you are HIGH RISK for COVID-19?  Marland Kitchen If you are having a medical emergency, call 911. . Seek medical care right away. Before you go to a doctor's office, urgent care or emergency department, .  call ahead and tell them about your recent travel, contact with someone diagnosed with COVID-19  .  and your symptoms.  . You should receive instructions from your physician's  office regarding next steps of care.  . When you arrive at healthcare provider, tell the healthcare staff immediately you have returned from  . visiting [place], [place], [place], [place] or [place]; or traveled in the [place] to [place], [place],  . [place] or [place] in the last two weeks or you have been in close contact with a person diagnosed with  . COVID-19 in the last 2 weeks.   . Tell the health care staff about your symptoms: fever, cough and shortness of breath. . After you have been seen by a medical provider, you will be either: o Tested for (COVID-19) and discharged home on quarantine except to seek medical care if  o symptoms worsen, and asked to  - Stay home and avoid contact with others until you get your results (4-5 days)  - Avoid travel on public transportation if possible (such as bus, train, or airplane) or o Sent to the Emergency Department by EMS for evaluation, COVID-19 testing  and  o possible admission depending on your condition and test results.  What to do if you are LOW RISK for COVID-19?  Reduce your risk of any infection by using the same precautions used for avoiding the common cold or flu:  Marland Kitchen Wash your hands often with soap and warm water for at least 20 seconds.  If soap and water are not readily  available,  . use an alcohol-based hand sanitizer with at least 60% alcohol.  . If coughing or sneezing, cover your mouth and nose by coughing or sneezing into the elbow areas of your shirt or coat, .  into a tissue or into your sleeve (not your hands). . Avoid shaking hands with others and consider head nods or verbal greetings only. . Avoid touching your eyes, nose, or mouth with unwashed hands.  . Avoid close contact with people who are sick. . Avoid places or events with large numbers of people in one location, like concerts or sporting events. . Carefully consider travel plans you have or are making. . If you are planning any travel outside or  inside the [place], visit the CDC's Travelers' Health webpage for the latest health notices. . If you have some symptoms but not all symptoms, continue to monitor at home and seek medical attention  . if your symptoms worsen. . If you are having a medical emergency, call 911.   . >>>>>>>>>>>>>>>>>>>>>>>>>>>>>>>>> . We Do NOT Approve of  [facility], [employer]  Our Patients  To Do Home Visits & We Do NOT Approve of [employer] > > > > > > > > > > > > > > > > > > > > > > > > > > > > > > > > > > > > > > >  Preventive Care for Adults  A healthy lifestyle and preventive care can promote health and wellness. Preventive health guidelines for women include the following key practices.  A routine yearly physical is a good way to check with your health care provider about your health and preventive screening. It is a chance to share any concerns and updates on your health and to receive a thorough exam.  Visit your dentist for a routine exam and preventive care every 6 months. Brush your teeth twice a day and floss once a day. Good oral hygiene prevents tooth decay and gum disease.  The frequency of eye exams is based on your age, health, family medical history, use of contact lenses, and other factors. Follow your health care provider's recommendations for frequency of eye exams.  Eat a healthy diet. Foods like vegetables, fruits, whole grains, low-fat dairy products, and lean protein foods contain the nutrients you need without too many calories. Decrease your intake of foods high in solid fats, added sugars, and salt. Eat the right amount of calories for you. Get information about a proper diet from your health care provider, if necessary.  Regular physical exercise is one of the most important things you can do for your health. Most adults should get at least 150 minutes of moderate-intensity exercise (any activity that increases your heart rate and causes you to sweat) each  week. In addition, most adults need muscle-strengthening exercises on 2 or more days a week.  Maintain a healthy weight. The body mass index (BMI) is a screening tool to identify possible weight problems. It provides an estimate of body fat based on height and weight. Your health care provider can find your BMI and can help you achieve or maintain a healthy weight. For adults 20 years and older:  A BMI below 18.5 is considered underweight.  A BMI of 18.5 to 24.9 is normal.  A BMI of 25 to 29.9 is considered overweight.  A BMI of 30 and above is considered obese.  Maintain normal blood lipids and cholesterol levels by exercising and minimizing your intake of  saturated fat. Eat a balanced diet with plenty of fruit and vegetables. If your lipid or cholesterol levels are high, you are over 50, or you are at high risk for heart disease, you may need your cholesterol levels checked more frequently. Ongoing high lipid and cholesterol levels should be treated with medicines if diet and exercise are not working.  If you smoke, find out from your health care provider how to quit. If you do not use tobacco, do not start.  Lung cancer screening is recommended for adults aged 78-80 years who are at high risk for developing lung cancer because of a history of smoking. A yearly low-dose CT scan of the lungs is recommended for people who have at least a 30-pack-year history of smoking and are a current smoker or have quit within the past 15 years. A pack year of smoking is smoking an average of 1 pack of cigarettes a day for 1 year (for example: 1 pack a day for 30 years or 2 packs a day for 15 years). Yearly screening should continue until the smoker has stopped smoking for at least 15 years. Yearly screening should be stopped for people who develop a health problem that would prevent them from having lung cancer treatment.  Avoid use of street drugs. Do not share needles with anyone. Ask for help if you need  support or instructions about stopping the use of drugs.  High blood pressure causes heart disease and increases the risk of stroke.  Ongoing high blood pressure should be treated with medicines if weight loss and exercise do not work.  If you are 14-45 years old, ask your health care provider if you should take aspirin to prevent strokes.  Diabetes screening involves taking a blood sample to check your fasting blood sugar level. This should be done once every 3 years, after age 40, if you are within normal weight and without risk factors for diabetes. Testing should be considered at a younger age or be carried out more frequently if you are overweight and have at least 1 risk factor for diabetes.  Breast cancer screening is essential preventive care for women. You should practice "breast self-awareness." This means understanding the normal appearance and feel of your breasts and may include breast self-examination. Any changes detected, no matter how small, should be reported to a health care provider. Women in their 18s and 30s should have a clinical breast exam (CBE) by a health care provider as part of a regular health exam every 1 to 3 years. After age 71, women should have a CBE every year. Starting at age 39, women should consider having a mammogram (breast X-ray test) every year. Women who have a family history of breast cancer should talk to their health care provider about genetic screening. Women at a high risk of breast cancer should talk to their health care providers about having an MRI and a mammogram every year.  Breast cancer gene (BRCA)-related cancer risk assessment is recommended for women who have family members with BRCA-related cancers. BRCA-related cancers include breast, ovarian, tubal, and peritoneal cancers. Having family members with these cancers may be associated with an increased risk for harmful changes (mutations) in the breast cancer genes BRCA1 and BRCA2. Results of the  assessment will determine the need for genetic counseling and BRCA1 and BRCA2 testing.  Routine pelvic exams to screen for cancer are no longer recommended for nonpregnant women who are considered low risk for cancer of the pelvic organs (ovaries,  uterus, and vagina) and who do not have symptoms. Ask your health care provider if a screening pelvic exam is right for you.  If you have had past treatment for cervical cancer or a condition that could lead to cancer, you need Pap tests and screening for cancer for at least 20 years after your treatment. If Pap tests have been discontinued, your risk factors (such as having a new sexual partner) need to be reassessed to determine if screening should be resumed. Some women have medical problems that increase the chance of getting cervical cancer. In these cases, your health care provider may recommend more frequent screening and Pap tests.    Colorectal cancer can be detected and often prevented. Most routine colorectal cancer screening begins at the age of 43 years and continues through age 69 years. However, your health care provider may recommend screening at an earlier age if you have risk factors for colon cancer. On a yearly basis, your health care provider may provide home test kits to check for hidden blood in the stool. Use of a small camera at the end of a tube, to directly examine the colon (sigmoidoscopy or colonoscopy), can detect the earliest forms of colorectal cancer. Talk to your health care provider about this at age 15, when routine screening begins.  Direct exam of the colon should be repeated every 5-10 years through age 22 years, unless early forms of pre-cancerous polyps or small growths are found.  Osteoporosis is a disease in which the bones lose minerals and strength with aging. This can result in serious bone fractures or breaks. The risk of osteoporosis can be identified using a bone density scan. Women ages 55 years and over and women  at risk for fractures or osteoporosis should discuss screening with their health care providers. Ask your health care provider whether you should take a calcium supplement or vitamin D to reduce the rate of osteoporosis.  Menopause can be associated with physical symptoms and risks. Hormone replacement therapy is available to decrease symptoms and risks. You should talk to your health care provider about whether hormone replacement therapy is right for you.  Use sunscreen. Apply sunscreen liberally and repeatedly throughout the day. You should seek shade when your shadow is shorter than you. Protect yourself by wearing long sleeves, pants, a wide-brimmed hat, and sunglasses year round, whenever you are outdoors.  Once a month, do a whole body skin exam, using a mirror to look at the skin on your back. Tell your health care provider of new moles, moles that have irregular borders, moles that are larger than a pencil eraser, or moles that have changed in shape or color.  Stay current with required vaccines (immunizations).  Influenza vaccine. All adults should be immunized every year.  Tetanus, diphtheria, and acellular pertussis (Td, Tdap) vaccine. Pregnant women should receive 1 dose of Tdap vaccine during each pregnancy. The dose should be obtained regardless of the length of time since the last dose. Immunization is preferred during the 27th-36th week of gestation. An adult who has not previously received Tdap or who does not know her vaccine status should receive 1 dose of Tdap. This initial dose should be followed by tetanus and diphtheria toxoids (Td) booster doses every 10 years. Adults with an unknown or incomplete history of completing a 3-dose immunization series with Td-containing vaccines should begin or complete a primary immunization series including a Tdap dose. Adults should receive a Td booster every 10 years.  Zoster vaccine. One dose is recommended for adults aged 64 years or  older unless certain conditions are present.    Pneumococcal 13-valent conjugate (PCV13) vaccine. When indicated, a person who is uncertain of her immunization history and has no record of immunization should receive the PCV13 vaccine. An adult aged 61 years or older who has certain medical conditions and has not been previously immunized should receive 1 dose of PCV13 vaccine. This PCV13 should be followed with a dose of pneumococcal polysaccharide (PPSV23) vaccine. The PPSV23 vaccine dose should be obtained at least 1 or more year(s) after the dose of PCV13 vaccine. An adult aged 46 years or older who has certain medical conditions and previously received 1 or more doses of PPSV23 vaccine should receive 1 dose of PCV13. The PCV13 vaccine dose should be obtained 1 or more years after the last PPSV23 vaccine dose.    Pneumococcal polysaccharide (PPSV23) vaccine. When PCV13 is also indicated, PCV13 should be obtained first. All adults aged 58 years and older should be immunized. An adult younger than age 1 years who has certain medical conditions should be immunized. Any person who resides in a nursing home or long-term care facility should be immunized. An adult smoker should be immunized. People with an immunocompromised condition and certain other conditions should receive both PCV13 and PPSV23 vaccines. People with human immunodeficiency virus (HIV) infection should be immunized as soon as possible after diagnosis. Immunization during chemotherapy or radiation therapy should be avoided. Routine use of PPSV23 vaccine is not recommended for American Indians, [place] Natives, or people younger than 65 years unless there are medical conditions that require PPSV23 vaccine. When indicated, people who have unknown immunization and have no record of immunization should receive PPSV23 vaccine. One-time revaccination 5 years after the first dose of PPSV23 is recommended for people aged 19-64 years who have chronic  kidney failure, nephrotic syndrome, asplenia, or immunocompromised conditions. People who received 1-2 doses of PPSV23 before age 11 years should receive another dose of PPSV23 vaccine at age 70 years or later if at least 5 years have passed since the previous dose. Doses of PPSV23 are not needed for people immunized with PPSV23 at or after age 65 years.   Preventive Services / Frequency  Ages 56 years and over  Blood pressure check.  Lipid and cholesterol check.  Lung cancer screening. / Every year if you are aged 16-80 years and have a 30-pack-year history of smoking and currently smoke or have quit within the past 15 years. Yearly screening is stopped once you have quit smoking for at least 15 years or develop a health problem that would prevent you from having lung cancer treatment.  Clinical breast exam.** / Every year after age 59 years.   BRCA-related cancer risk assessment.** / For women who have family members with a BRCA-related cancer (breast, ovarian, tubal, or peritoneal cancers).  Mammogram.** / Every year beginning at age 70 years and continuing for as long as you are in good health. Consult with your health care provider.  Pap test.** / Every 3 years starting at age 32 years through age 28 or 9 years with 3 consecutive normal Pap tests. Testing can be stopped between 65 and 70 years with 3 consecutive normal Pap tests and no abnormal Pap or HPV tests in the past 10 years.  Fecal occult blood test (FOBT) of stool. / Every year beginning at age 57 years and continuing until age 29 years. You may not need to  do this test if you get a colonoscopy every 10 years.  Flexible sigmoidoscopy or colonoscopy.** / Every 5 years for a flexible sigmoidoscopy or every 10 years for a colonoscopy beginning at age 21 years and continuing until age 56 years.  Hepatitis C blood test.** / For all people born from [birth year] through 1963 and any individual with known risks for hepatitis  C.  Osteoporosis screening.** / A one-time screening for women ages 53 years and over and women at risk for fractures or osteoporosis.  Skin self-exam. / Monthly.  Influenza vaccine. / Every year.  Tetanus, diphtheria, and acellular pertussis (Tdap/Td) vaccine.** / 1 dose of Td every 10 years.  Zoster vaccine.** / 1 dose for adults aged 32 years or older.  Pneumococcal 13-valent conjugate (PCV13) vaccine.** / Consult your health care provider.  Pneumococcal polysaccharide (PPSV23) vaccine.** / 1 dose for all adults aged 67 years and older. Screening for abdominal aortic aneurysm (AAA)  by ultrasound is recommended for people who have history of high blood pressure or who are current or former smokers. ++++++++++++++++++++ Recommend Adult Low Dose Aspirin or  coated  Aspirin 81 mg daily  To reduce risk of Colon Cancer 40 %,  Skin Cancer 26 % ,  Melanoma 46%  and  Pancreatic cancer 60% ++++++++++++++++++++ Vitamin D goal  is between 70-100.  Please make sure that you are taking your Vitamin D as directed.  It is very important as a natural anti-inflammatory  helping hair, skin, and nails, as well as reducing stroke and heart attack risk.  It helps your bones and helps with mood. It also decreases numerous cancer risks so please take it as directed.  Low Vit D is associated with a 200-300% higher risk for CANCER  and 200-300% higher risk for HEART   ATTACK  &  STROKE.   .....................................Marland Kitchen It is also associated with higher death rate at younger ages,  autoimmune diseases like Rheumatoid arthritis, Lupus, Multiple Sclerosis.    Also many other serious conditions, like depression, Alzheimer's Dementia, infertility, muscle aches, fatigue, fibromyalgia - just to name a few. ++++++++++++++++++ Recommend the book "The END of DIETING" by Dr Excell Seltzer  & the book "The END of DIABETES " by Dr Excell Seltzer At [email or web address] - get book & Audio CD's    Being diabetic has  a  300% increased risk for heart attack, stroke, cancer, and alzheimer- type vascular dementia. It is very important that you work harder with diet by avoiding all foods that are white. Avoid white rice (brown & wild rice is OK), white potatoes (sweetpotatoes in moderation is OK), White bread or wheat bread or anything made out of white flour like bagels, donuts, rolls, buns, biscuits, cakes, pastries, cookies, pizza crust, and pasta (made from white flour & egg whites) - vegetarian pasta or spinach or wheat pasta is OK. Multigrain breads like Arnold's or [employer], or multigrain sandwich thins or flatbreads.  Diet, exercise and weight loss can reverse and cure diabetes in the early stages.  Diet, exercise and weight loss is very important in the control and prevention of complications of diabetes which affects every system in your body, ie. Brain - dementia/stroke, eyes - glaucoma/blindness, heart - heart attack/heart failure, kidneys - dialysis, stomach - gastric paralysis, intestines - malabsorption, nerves - severe painful neuritis, circulation - gangrene & loss of a leg(s), and finally cancer and Alzheimers.    I recommend avoid fried & greasy foods,  sweets/candy, white rice (brown or wild  rice or Quinoa is OK), white potatoes (sweet potatoes are OK) - anything made from white flour - bagels, doughnuts, rolls, buns, biscuits,white and wheat breads, pizza crust and traditional pasta made of white flour & egg white(vegetarian pasta or spinach or wheat pasta is OK).  Multi-grain bread is OK - like multi-grain flat bread or sandwich thins. Avoid alcohol in excess. Exercise is also important.    Eat all the vegetables you want - avoid meat, especially red meat and dairy - especially cheese.  Cheese is the most concentrated form of trans-fats which is the worst thing to clog up our arteries. Veggie cheese is OK which can be found in the fresh produce section at [employer] or [employer] or  [employer]  +++++++++++++++++++ DASH Eating Plan  DASH stands for "Dietary Approaches to Stop Hypertension."   The DASH eating plan is a healthy eating plan that has been shown to reduce high blood pressure (hypertension). Additional health benefits may include reducing the risk of type 2 diabetes mellitus, heart disease, and stroke. The DASH eating plan may also help with weight loss. WHAT DO I NEED TO KNOW ABOUT THE DASH EATING PLAN? For the DASH eating plan, you will follow these general guidelines:  Choose foods with a percent daily value for sodium of less than 5% (as listed on the food label).  Use salt-free seasonings or herbs instead of table salt or sea salt.  Check with your health care provider or pharmacist before using salt substitutes.  Eat lower-sodium products, often labeled as "lower sodium" or "no salt added."  Eat fresh foods.  Eat more vegetables, fruits, and low-fat dairy products.  Choose whole grains. Look for the word "whole" as the first word in the ingredient list.  Choose fish   Limit sweets, desserts, sugars, and sugary drinks.  Choose heart-healthy fats.  Eat veggie cheese   Eat more home-cooked food and less restaurant, buffet, and fast food.  Limit fried foods.  Cook foods using methods other than frying.  Limit canned vegetables. If you do use them, rinse them well to decrease the sodium.  When eating at a restaurant, ask that your food be prepared with less salt, or no salt if possible.                      WHAT FOODS CAN I EAT? Read Dr Fara Olden Fuhrman's books on The End of Dieting & The End of Diabetes  Grains Whole grain or whole wheat bread. Brown rice. Whole grain or whole wheat pasta. Quinoa, bulgur, and whole grain cereals. Low-sodium cereals. Corn or whole wheat flour tortillas. Whole grain cornbread. Whole grain crackers. Low-sodium crackers.  Vegetables Fresh or frozen vegetables (raw, steamed, roasted, or grilled). Low-sodium or  reduced-sodium tomato and vegetable juices. Low-sodium or reduced-sodium tomato sauce and paste. Low-sodium or reduced-sodium canned vegetables.   Fruits All fresh, canned (in natural juice), or frozen fruits.  Protein Products  All fish and seafood.  Dried beans, peas, or lentils. Unsalted nuts and seeds. Unsalted canned beans.  Dairy Low-fat dairy products, such as skim or 1% milk, 2% or reduced-fat cheeses, low-fat ricotta or cottage cheese, or plain low-fat yogurt. Low-sodium or reduced-sodium cheeses.  Fats and Oils Tub margarines without trans fats. Light or reduced-fat mayonnaise and salad dressings (reduced sodium). Avocado. Safflower, olive, or canola oils. Natural peanut or almond butter.  Other Unsalted popcorn and pretzels. The items listed above may not be a complete list of recommended foods  or beverages. Contact your dietitian for more options.  +++++++++++++++  WHAT FOODS ARE NOT RECOMMENDED? Grains/ White flour or wheat flour White bread. White pasta. White rice. Refined cornbread. Bagels and croissants. Crackers that contain trans fat.  Vegetables  Creamed or fried vegetables. Vegetables in a . Regular canned vegetables. Regular canned tomato sauce and paste. Regular tomato and vegetable juices.  Fruits Dried fruits. Canned fruit in light or heavy syrup. Fruit juice.  Meat and Other Protein Products Meat in general - RED meat & White meat.  Fatty cuts of meat. Ribs, chicken wings, all processed meats as bacon, sausage, bologna, salami, fatback, hot dogs, bratwurst and packaged luncheon meats.  Dairy Whole or 2% milk, cream, half-and-half, and cream cheese. Whole-fat or sweetened yogurt. Full-fat cheeses or blue cheese. Non-dairy creamers and whipped toppings. Processed cheese, cheese spreads, or cheese curds.  Condiments Onion and garlic salt, seasoned salt, table salt, and sea salt. Canned and packaged gravies. Worcestershire sauce. Tartar sauce. Barbecue  sauce. Teriyaki sauce. Soy sauce, including reduced sodium. Steak sauce. Fish sauce. Oyster sauce. Cocktail sauce. Horseradish. Ketchup and mustard. Meat flavorings and tenderizers. Bouillon cubes. Hot sauce. Tabasco sauce. Marinades. Taco seasonings. Relishes.  Fats and Oils Butter, stick margarine, lard, shortening and bacon fat. Coconut, palm kernel, or palm oils. Regular salad dressings.  Pickles and olives. Salted popcorn and pretzels.  The items listed above may not be a complete list of foods and beverages to avoid.

## 2020-07-02 ENCOUNTER — Ambulatory Visit (INDEPENDENT_AMBULATORY_CARE_PROVIDER_SITE_OTHER): Payer: PPO | Admitting: Internal Medicine

## 2020-07-02 ENCOUNTER — Other Ambulatory Visit: Payer: Self-pay

## 2020-07-02 VITALS — BP 136/86 | HR 60 | Temp 97.7°F | Resp 16 | Ht 64.0 in | Wt 176.4 lb

## 2020-07-02 DIAGNOSIS — I480 Paroxysmal atrial fibrillation: Secondary | ICD-10-CM | POA: Diagnosis not present

## 2020-07-02 DIAGNOSIS — D6859 Other primary thrombophilia: Secondary | ICD-10-CM

## 2020-07-02 DIAGNOSIS — Z87891 Personal history of nicotine dependence: Secondary | ICD-10-CM

## 2020-07-02 DIAGNOSIS — K219 Gastro-esophageal reflux disease without esophagitis: Secondary | ICD-10-CM

## 2020-07-02 DIAGNOSIS — Z136 Encounter for screening for cardiovascular disorders: Secondary | ICD-10-CM

## 2020-07-02 DIAGNOSIS — E782 Mixed hyperlipidemia: Secondary | ICD-10-CM | POA: Diagnosis not present

## 2020-07-02 DIAGNOSIS — R7309 Other abnormal glucose: Secondary | ICD-10-CM

## 2020-07-02 DIAGNOSIS — I10 Essential (primary) hypertension: Secondary | ICD-10-CM

## 2020-07-02 DIAGNOSIS — Z0000 Encounter for general adult medical examination without abnormal findings: Secondary | ICD-10-CM

## 2020-07-02 DIAGNOSIS — Z79899 Other long term (current) drug therapy: Secondary | ICD-10-CM

## 2020-07-02 DIAGNOSIS — Z1211 Encounter for screening for malignant neoplasm of colon: Secondary | ICD-10-CM

## 2020-07-02 DIAGNOSIS — E559 Vitamin D deficiency, unspecified: Secondary | ICD-10-CM

## 2020-07-02 DIAGNOSIS — Z8249 Family history of ischemic heart disease and other diseases of the circulatory system: Secondary | ICD-10-CM

## 2020-07-02 DIAGNOSIS — Z0001 Encounter for general adult medical examination with abnormal findings: Secondary | ICD-10-CM

## 2020-07-02 MED ORDER — GABAPENTIN 300 MG PO CAPS: ORAL_CAPSULE | ORAL | 0 refills | Status: DC

## 2020-07-02 MED ORDER — ELIQUIS 5 MG PO TABS: ORAL_TABLET | ORAL | 3 refills | Status: DC

## 2020-07-03 ENCOUNTER — Other Ambulatory Visit: Payer: Self-pay | Admitting: Internal Medicine

## 2020-07-03 DIAGNOSIS — N3000 Acute cystitis without hematuria: Secondary | ICD-10-CM

## 2020-07-03 LAB — CBC WITH DIFFERENTIAL/PLATELET
Absolute Monocytes: 598 cells/uL (ref 200–950)
Basophils Absolute: 52 cells/uL (ref 0–200)
Basophils Relative: 0.8 %
Eosinophils Absolute: 59 cells/uL (ref 15–500)
Eosinophils Relative: 0.9 %
HCT: 38.1 % (ref 35.0–45.0)
Hemoglobin: 12 g/dL (ref 11.7–15.5)
Lymphs Abs: 1580 cells/uL (ref 850–3900)
MCH: 27.7 pg (ref 27.0–33.0)
MCHC: 31.5 g/dL — ABNORMAL LOW (ref 32.0–36.0)
MCV: 88 fL (ref 80.0–100.0)
MPV: 12.5 fL (ref 7.5–12.5)
Monocytes Relative: 9.2 %
Neutro Abs: 4212 cells/uL (ref 1500–7800)
Neutrophils Relative %: 64.8 %
Platelets: 224 10*3/uL (ref 140–400)
RBC: 4.33 10*6/uL (ref 3.80–5.10)
RDW: 13.7 % (ref 11.0–15.0)
Total Lymphocyte: 24.3 %
WBC: 6.5 10*3/uL (ref 3.8–10.8)

## 2020-07-03 LAB — URINALYSIS, ROUTINE W REFLEX MICROSCOPIC
Bacteria, UA: NONE SEEN /HPF
Bilirubin Urine: NEGATIVE
Glucose, UA: NEGATIVE
Hgb urine dipstick: NEGATIVE
Nitrite: NEGATIVE
RBC / HPF: NONE SEEN /HPF (ref 0–2)
Specific Gravity, Urine: 1.04 — ABNORMAL HIGH (ref 1.001–1.03)
pH: <=5 (ref 5.0–8.0)

## 2020-07-03 LAB — COMPLETE METABOLIC PANEL WITH GFR
AG Ratio: 2 (calc) (ref 1.0–2.5)
ALT: 7 U/L (ref 6–29)
AST: 11 U/L (ref 10–35)
Albumin: 4.3 g/dL (ref 3.6–5.1)
Alkaline phosphatase (APISO): 90 U/L (ref 37–153)
BUN: 25 mg/dL (ref 7–25)
CO2: 26 mmol/L (ref 20–32)
Calcium: 9.6 mg/dL (ref 8.6–10.4)
Chloride: 107 mmol/L (ref 98–110)
Creat: 0.91 mg/dL (ref 0.60–0.93)
GFR, Est African American: 71 mL/min/{1.73_m2} (ref >=60)
GFR, Est Non African American: 61 mL/min/{1.73_m2} (ref >=60)
Globulin: 2.1 g/dL (calc) (ref 1.9–3.7)
Glucose, Bld: 94 mg/dL (ref 65–99)
Potassium: 4.7 mmol/L (ref 3.5–5.3)
Sodium: 142 mmol/L (ref 135–146)
Total Bilirubin: 0.3 mg/dL (ref 0.2–1.2)
Total Protein: 6.4 g/dL (ref 6.1–8.1)

## 2020-07-03 LAB — MICROALBUMIN / CREATININE URINE RATIO
Creatinine, Urine: 286 mg/dL — ABNORMAL HIGH (ref 20–275)
Microalb Creat Ratio: 9 mcg/mg creat (ref <30)
Microalb, Ur: 2.5 mg/dL

## 2020-07-03 LAB — LIPID PANEL
Cholesterol: 180 mg/dL (ref <200)
HDL: 50 mg/dL (ref >=50)
LDL Cholesterol (Calc): 102 mg/dL (calc) — ABNORMAL HIGH
Non-HDL Cholesterol (Calc): 130 mg/dL (calc) — ABNORMAL HIGH (ref <130)
Total CHOL/HDL Ratio: 3.6 (calc) (ref <5.0)
Triglycerides: 164 mg/dL — ABNORMAL HIGH (ref <150)

## 2020-07-03 LAB — INSULIN, RANDOM: Insulin: 7.5 u[IU]/mL

## 2020-07-03 LAB — HEMOGLOBIN A1C
Hgb A1c MFr Bld: 5.3 % of total Hgb (ref <5.7)
Mean Plasma Glucose: 105 mg/dL
eAG (mmol/L): 5.8 mmol/L

## 2020-07-03 LAB — MICROSCOPIC MESSAGE

## 2020-07-03 LAB — MAGNESIUM: Magnesium: 2.3 mg/dL (ref 1.5–2.5)

## 2020-07-03 LAB — TSH: TSH: 2.31 mIU/L (ref 0.40–4.50)

## 2020-07-03 LAB — VITAMIN D 25 HYDROXY (VIT D DEFICIENCY, FRACTURES): Vit D, 25-Hydroxy: 48 ng/mL (ref 30–100)

## 2020-07-03 NOTE — Progress Notes (Signed)
============================================================ ============================================================  -    U/A suspicious for UTI - will order culture ============================================================ ============================================================  - Glucose down from 165 to 61 mg% - Great  ! ============================================================ ============================================================  -  Total Chol = 180   -  Excellent   - Very low risk for Heart Attack  / Stroke ========================================================  - But the  LDL Chol is sl elevated at 102           ( Ideal or Goal is less than 70) , So  - Recommend a stricter  low cholesterol diet   - Cholesterol only comes from animal sources  - ie. meat, dairy, egg yolks  - Eat all the vegetables you want.  - Avoid meat, especially red meat - Beef AND Pork .  - Avoid cheese & dairy - milk & ice cream.     - Cheese is the most concentrated form of trans-fats which  is the worst thing to clog up our arteries.   - Veggie cheese is OK which can be found in the fresh  produce section at [employer] or [employer] or [employer] ============================================================ ============================================================  -  A1c = 5.3% - Normal - Great - No Diabetes  ! ============================================================ ============================================================  -  Vitamin D = 48 - a little low   - Vitamin D goal is between 70-100.   - Please INCREASE  your Vitamin D to 5,000 units EVERY day   - It is very important as a natural anti-inflammatory and helping the  immune system protect against viral infections, like the Covid-19    helping hair, skin, and nails, as well as reducing stroke and  heart attack risk.   - It helps your bones and helps with mood.  - It also decreases  numerous cancer risks so please  take it as directed.   - Low Vit D is associated with a 200-300% higher risk for  CANCER   and 200-300% higher risk for HEART   ATTACK  &  STROKE.    - It is also associated with higher death rate at younger ages,   autoimmune diseases like Rheumatoid arthritis, Lupus,  Multiple Sclerosis.     - Also many other serious conditions, like depression, Alzheimer's  Dementia, infertility, muscle aches, fatigue, fibromyalgia   - just to name a few. ============================================================ ============================================================  -  All Else - CBC - Kidneys - Electrolytes - Liver - Magnesium & Thyroid    - all  Normal / OK ============================================================ ============================================================  -  Keep up the [place] Work ! ============================================================ ============================================================

## 2020-07-04 LAB — URINE CULTURE
MICRO NUMBER:: 11793073
Result:: NO GROWTH
SPECIMEN QUALITY:: ADEQUATE

## 2020-07-04 NOTE — Progress Notes (Signed)
============================================================ °============================================================ ° °-    Urine culture returned OK - > No Infection  °============================================================ °============================================================ °

## 2020-07-06 ENCOUNTER — Encounter: Payer: Self-pay | Admitting: Internal Medicine

## 2020-07-10 ENCOUNTER — Other Ambulatory Visit: Payer: Self-pay | Admitting: Adult Health Nurse Practitioner

## 2020-07-10 DIAGNOSIS — I10 Essential (primary) hypertension: Secondary | ICD-10-CM

## 2020-07-10 DIAGNOSIS — D649 Anemia, unspecified: Secondary | ICD-10-CM

## 2020-09-01 ENCOUNTER — Emergency Department (HOSPITAL_COMMUNITY): Payer: Medicare Other

## 2020-09-01 ENCOUNTER — Other Ambulatory Visit: Payer: Self-pay

## 2020-09-01 ENCOUNTER — Emergency Department
Admission: EM | Admit: 2020-09-01 | Discharge: 2020-09-02 | Disposition: A | Discharge status: Home / Self Care | Payer: Medicare Other | Attending: Physician Assistant | Admitting: Physician Assistant

## 2020-09-01 ENCOUNTER — Encounter (HOSPITAL_COMMUNITY): Payer: Self-pay | Admitting: NURSE PRACTITIONER

## 2020-09-01 DIAGNOSIS — Z20822 Contact with and (suspected) exposure to covid-19: Secondary | ICD-10-CM | POA: Insufficient documentation

## 2020-09-01 DIAGNOSIS — K529 Noninfective gastroenteritis and colitis, unspecified: Secondary | ICD-10-CM | POA: Insufficient documentation

## 2020-09-01 DIAGNOSIS — R112 Nausea with vomiting, unspecified: Secondary | ICD-10-CM

## 2020-09-01 DIAGNOSIS — U071 COVID-19: Secondary | ICD-10-CM

## 2020-09-01 DIAGNOSIS — Z23 Encounter for immunization: Secondary | ICD-10-CM | POA: Insufficient documentation

## 2020-09-01 HISTORY — DX: Type 2 diabetes mellitus without complications: E11.9

## 2020-09-01 HISTORY — DX: Presence of spectacles and contact lenses: Z97.3

## 2020-09-01 HISTORY — DX: Acute myocardial infarction, unspecified: I21.9

## 2020-09-01 HISTORY — DX: Hyperlipidemia, unspecified: E78.5

## 2020-09-01 HISTORY — DX: Essential (primary) hypertension: I10

## 2020-09-01 LAB — URINALYSIS, MACROSCOPIC
BILIRUBIN: NEGATIVE mg/dL
BLOOD: NEGATIVE mg/dL
GLUCOSE: 250 mg/dL — AB
NITRITE: NEGATIVE
PH: 5.5 (ref 5.0–8.5)
SPECIFIC GRAVITY: >=1.03 (ref 1.005–1.030)
UROBILINOGEN: 0.2 mg/dL

## 2020-09-01 LAB — CBC WITH DIFF
BASOPHIL #: <0.1 10*3/uL (ref <=0.20)
BASOPHIL %: 1 %
EOSINOPHIL #: <0.1 10*3/uL (ref <=0.50)
EOSINOPHIL %: 1 %
HCT: 36.5 % (ref 34.8–46.0)
HGB: 12.1 g/dL (ref 11.5–16.0)
IMMATURE GRANULOCYTE #: <0.1 10*3/uL (ref <0.10)
IMMATURE GRANULOCYTE %: 0 % (ref 0–1)
LYMPHOCYTE #: 0.74 10*3/uL — ABNORMAL LOW (ref 1.00–4.80)
LYMPHOCYTE %: 19 %
MCH: 27 pg (ref 26.0–32.0)
MCHC: 33.2 g/dL (ref 31.0–35.5)
MCV: 81.5 fL (ref 78.0–100.0)
MONOCYTE #: 0.47 10*3/uL (ref 0.20–1.10)
MONOCYTE %: 12 %
MPV: 11.1 fL (ref 8.7–12.5)
NEUTROPHIL #: 2.68 10*3/uL (ref 1.50–7.70)
NEUTROPHIL %: 67 %
PLATELETS: 225 10*3/uL (ref 150–400)
RBC: 4.48 10*6/uL (ref 3.85–5.22)
RDW-CV: 13.8 % (ref 11.5–15.5)
WBC: 4 10*3/uL (ref 3.7–11.0)

## 2020-09-01 LAB — COMPREHENSIVE METABOLIC PANEL, NON-FASTING
ALBUMIN: 3.9 g/dL (ref 3.4–4.8)
ALKALINE PHOSPHATASE: 120 U/L (ref 55–145)
ALT (SGPT): 27 U/L — ABNORMAL HIGH (ref 8–22)
ANION GAP: 10 mmol/L (ref 4–13)
AST (SGOT): 25 U/L (ref 8–45)
BILIRUBIN TOTAL: 0.5 mg/dL (ref 0.3–1.3)
BUN/CREA RATIO: 17 (ref 6–22)
BUN: 18 mg/dL (ref 8–25)
CALCIUM: 9.6 mg/dL (ref 8.8–10.2)
CHLORIDE: 101 mmol/L (ref 96–111)
CO2 TOTAL: 28 mmol/L (ref 23–31)
CREATININE: 1.09 mg/dL — ABNORMAL HIGH (ref 0.60–1.05)
ESTIMATED GFR: 52 mL/min/BSA — ABNORMAL LOW (ref >=60)
GLUCOSE: 214 mg/dL — ABNORMAL HIGH (ref 65–125)
POTASSIUM: 4 mmol/L (ref 3.5–5.1)
PROTEIN TOTAL: 7.7 g/dL (ref 6.0–8.0)
SODIUM: 139 mmol/L (ref 136–145)

## 2020-09-01 LAB — URINALYSIS, MICROSCOPIC

## 2020-09-01 LAB — BLUE TOP TUBE

## 2020-09-01 LAB — LIPASE: LIPASE: 14 U/L (ref 10–60)

## 2020-09-01 LAB — COVID-19 ~~LOC~~ MOLECULAR LAB TESTING: SARS-CoV-2: DETECTED — AB

## 2020-09-01 LAB — LACTIC ACID LEVEL W/ REFLEX FOR LEVEL >2.0: LACTIC ACID: 1.1 mmol/L (ref 0.5–2.2)

## 2020-09-01 MED ORDER — SODIUM CHLORIDE 0.9 % IV BOLUS
1000.0000 mL | INJECTION | Status: AC
Start: 2020-09-01 — End: 2020-09-01
  Administered 2020-09-01: 0 mL via INTRAVENOUS
  Administered 2020-09-01: 1000 mL via INTRAVENOUS

## 2020-09-01 MED ORDER — ONDANSETRON HCL 4 MG TABLET
4.0000 mg | ORAL_TABLET | Freq: Three times a day (TID) | ORAL | 0 refills | Status: DC | PRN
Start: 2020-09-01 — End: 2022-12-30

## 2020-09-01 MED ORDER — DIPHENHYDRAMINE 50 MG/ML INJECTION SOLUTION
50.0000 mg | Freq: Once | INTRAMUSCULAR | Status: DC | PRN
Start: 2020-09-01 — End: 2020-09-02

## 2020-09-01 MED ORDER — DIPHENHYDRAMINE 50 MG/ML INJECTION SOLUTION
25.0000 mg | Freq: Once | INTRAMUSCULAR | Status: DC | PRN
Start: 2020-09-01 — End: 2020-09-02

## 2020-09-01 MED ORDER — ACETAMINOPHEN 325 MG TABLET
650.0000 mg | ORAL_TABLET | Freq: Once | ORAL | Status: AC
Start: 2020-09-02 — End: 2020-09-01
  Administered 2020-09-01: 650 mg via ORAL
  Filled 2020-09-01: qty 2

## 2020-09-01 MED ORDER — EUA BEBTELOVIMAB 175 MG/2 ML (87.5 MG/ML) INTRAVENOUS SOLUTION (STATE SUPPLY)
175.0000 mg | Freq: Once | INTRAVENOUS | Status: AC
Start: 2020-09-02 — End: 2020-09-01
  Administered 2020-09-01: 175 mg via INTRAVENOUS
  Filled 2020-09-01: qty 2

## 2020-09-01 MED ORDER — DIPHENHYDRAMINE 25 MG CAPSULE
50.0000 mg | ORAL_CAPSULE | Freq: Once | ORAL | Status: AC
Start: 2020-09-02 — End: 2020-09-01
  Administered 2020-09-01: 50 mg via ORAL
  Filled 2020-09-01: qty 2

## 2020-09-01 MED ORDER — ONDANSETRON 4 MG DISINTEGRATING TABLET - ED TO GO MED
4.0000 mg | ORAL_TABLET | ORAL | Status: AC
Start: 2020-09-01 — End: 2020-09-02
  Administered 2020-09-02: 4 mg via SUBLINGUAL
  Filled 2020-09-01: qty 2

## 2020-09-01 MED ORDER — KETOROLAC 30 MG/ML (1 ML) INJECTION SOLUTION
15.0000 mg | INTRAMUSCULAR | Status: AC
Start: 2020-09-01 — End: 2020-09-01
  Administered 2020-09-01: 15 mg via INTRAVENOUS
  Filled 2020-09-01: qty 1

## 2020-09-01 MED ORDER — IOPAMIDOL 300 MG IODINE/ML (61 %) INTRAVENOUS SOLUTION
100.0000 mL | INTRAVENOUS | Status: AC
Start: 2020-09-01 — End: 2020-09-01
  Administered 2020-09-01: 22:00:00 100 mL via INTRAVENOUS

## 2020-09-01 MED ORDER — EPINEPHRINE 1 MG/ML (1 ML) INJECTION SOLUTION
0.3000 mg | Freq: Once | INTRAMUSCULAR | Status: DC | PRN
Start: 2020-09-01 — End: 2020-09-02

## 2020-09-01 MED ORDER — HYDROCORTISONE SOD SUCCINATE 100 MG/2 ML VIAL WRAPPER
100.0000 mg | Freq: Once | INTRAMUSCULAR | Status: DC | PRN
Start: 2020-09-01 — End: 2020-09-02

## 2020-09-01 MED ORDER — ONDANSETRON HCL (PF) 4 MG/2 ML INJECTION SOLUTION
4.0000 mg | INTRAMUSCULAR | Status: AC
Start: 2020-09-01 — End: 2020-09-01
  Administered 2020-09-01: 4 mg via INTRAVENOUS
  Filled 2020-09-01: qty 2

## 2020-09-01 MED ORDER — FAMOTIDINE (PF) 20 MG/2 ML INTRAVENOUS SOLUTION
20.0000 mg | Freq: Once | INTRAVENOUS | Status: DC | PRN
Start: 2020-09-01 — End: 2020-09-02

## 2020-09-01 NOTE — ED Provider Notes (Signed)
[facility]  Emergency Department  Provider Note      Melissa Wolf  24-May-1940  80 y.o.  female  Melissa Wolf [id number]   [phone] (home)  Melissa Officer, DO    Chief Complaint:   Chief Complaint   Patient presents with   . Nausea   . Vomiting   . Weakness       HPI: This is a 80 y.o. female who presents to the emergency department complaining of nausea vomiting diarrhea epigastric pain.  This started 3 days ago started out with nausea vomiting.  She says the vomiting has stopped she still had some nausea.  Today started having diarrhea and epigastric pain.  It is sharp pain source of movement.  There is no real alleviating factors.  Pain is only mild now.  She had some chills earlier but no fever.  She has also had some decreased appetite.  Patient has no history of pancreatitis she still has a gallbladder.  There is no sick contacts at home.  No real concern for any food poisoning.  She has been on no recent antibiotics.      Past Medical History:   Past Medical History:   Diagnosis Date   . Diabetes mellitus, type 2 (CMS [name])    . HTN (hypertension)    . Hyperlipidemia    . Myocardial infarction (CMS [name])    . Wears glasses        Past Surgical History:   Past Surgical History:   Procedure Laterality Date   . Hand surgery     . Hx coronary stent placement     . Hx hysterectomy     . Hx tonsillectomy     . Shoulder surgery Bilateral        Social History:   Social History     Tobacco Use   . Smoking status: Never Smoker   . Smokeless tobacco: Never Used   Substance Use Topics   . Alcohol use: Not Currently   . Drug use: Not Currently      Social History     Substance and Sexual Activity   Drug Use Not Currently       Allergies:   Allergies   Allergen Reactions   . Codeine      Pt doesn't remember exact rxn       Medications: (Not in an outpatient encounter)         Review of Systems   Constitutional: Negative for chills, fever and malaise/fatigue.        Review of systems as  below.  Additional systems reviewed in HPI.     HENT: Negative for congestion, sinus pain, sore throat and tinnitus.    Eyes: Negative for blurred vision, photophobia, pain and redness.   Respiratory: Negative for cough, hemoptysis, shortness of breath and wheezing.    Cardiovascular: Negative for chest pain, palpitations, orthopnea, leg swelling and PND.   Gastrointestinal: Negative for abdominal pain, blood in stool, diarrhea, heartburn, nausea and vomiting.   Genitourinary: Negative for dysuria, frequency, hematuria and urgency.   Musculoskeletal: Negative for back pain, joint pain, myalgias and neck pain.   Skin: Negative for rash.   Neurological: Negative for dizziness, sensory change, speech change, focal weakness, weakness and headaches.   Endo/Heme/Allergies: Negative for environmental allergies. Does not bruise/bleed easily.   Psychiatric/Behavioral: Negative for depression, hallucinations, memory loss, substance abuse and suicidal ideas.         ED Triage  Vitals [09/01/20 2015]   BP (Non-Invasive) (!) 84/69   Heart Rate 70   Respiratory Rate 18   Temperature 36.5 C (97.7 F)   SpO2 98 %   Weight 84.4 kg (186 lb)   Height 1.651 m (5\' 5" )       Physical Exam  Constitutional:       Comments: Female patient lying supine in bed.  Does not appear to be in acute distress   HENT:      Head: Normocephalic and atraumatic.      Right Ear: External ear normal.      Left Ear: External ear normal.      Nose: Nose normal.   Eyes:      Pupils: Pupils are equal, round, and reactive to light.   Cardiovascular:      Rate and Rhythm: Normal rate and regular rhythm.      Heart sounds: Normal heart sounds.   Pulmonary:      Breath sounds: Normal breath sounds.   Abdominal:      General: Bowel sounds are normal. There is no distension.      Palpations: Abdomen is soft.      Tenderness: There is no abdominal tenderness.      Comments: Epigastric tenderness   Musculoskeletal:         General: No tenderness or deformity. Normal  range of motion.      Cervical back: Normal range of motion and neck supple.   Skin:     General: Skin is warm and dry.      Findings: No rash.   Neurological:      Mental Status: She is alert and oriented to person, place, and time.      Cranial Nerves: No cranial nerve deficit.      Deep Tendon Reflexes: Reflexes are normal and symmetric.   Psychiatric:         Mood and Affect: Affect normal.         Cognition and Memory: Memory normal.         Judgment: Judgment normal.           Labs:   Results for orders placed or performed during the hospital encounter of 09/01/20 (from the past 12 hour(s))   COMPREHENSIVE METABOLIC PANEL, NON-FASTING   Result Value Ref Range    SODIUM 139 136 - 145 mmol/L    POTASSIUM 4.0 3.5 - 5.1 mmol/L    CHLORIDE 101 96 - 111 mmol/L    CO2 TOTAL 28 23 - 31 mmol/L    ANION GAP 10 4 - 13 mmol/L    BUN 18 8 - 25 mg/dL    CREATININE 09/03/20 (H) 0.60 - 1.05 mg/dL    BUN/CREA RATIO 17 6 - 22    ESTIMATED GFR 52 (L) >=60 mL/min/BSA    ALBUMIN 3.9 3.4 - 4.8 g/dL     CALCIUM 9.6 8.8 - 4.85 mg/dL    GLUCOSE 46.2 (H) 65 - 125 mg/dL    ALKALINE PHOSPHATASE 120 55 - 145 U/L    ALT (SGPT) 27 (H) 8 - 22 U/L    AST (SGOT)  25 8 - 45 U/L    BILIRUBIN TOTAL 0.5 0.3 - 1.3 mg/dL    PROTEIN TOTAL 7.7 6.0 - 8.0 g/dL   LIPASE   Result Value Ref Range    LIPASE 14 10 - 60 U/L   LACTIC ACID LEVEL W/ REFLEX FOR LEVEL >2.0   Result Value Ref Range  LACTIC ACID 1.1 0.5 - 2.2 mmol/L   CBC WITH DIFF   Result Value Ref Range    WBC 4.0 3.7 - 11.0 x10^3/uL    RBC 4.48 3.85 - 5.22 x10^6/uL    HGB 12.1 11.5 - 16.0 g/dL    HCT 51.0 25.8 - 52.7 %    MCV 81.5 78.0 - 100.0 fL    MCH 27.0 26.0 - 32.0 pg    MCHC 33.2 31.0 - 35.5 g/dL    RDW-CV 78.2 42.3 - 53.6 %    PLATELETS 225 150 - 400 x10^3/uL    MPV 11.1 8.7 - 12.5 fL    NEUTROPHIL % 67 %    LYMPHOCYTE % 19 %    MONOCYTE % 12 %    EOSINOPHIL % 1 %    BASOPHIL % 1 %    NEUTROPHIL # 2.68 1.50 - 7.70 x10^3/uL    LYMPHOCYTE # 0.74 (L) 1.00 - 4.80 x10^3/uL    MONOCYTE # 0.47 0.20  - 1.10 x10^3/uL    EOSINOPHIL # <0.10 <=0.50 x10^3/uL    BASOPHIL # <0.10 <=0.20 x10^3/uL    IMMATURE GRANULOCYTE % 0 0 - 1 %    IMMATURE GRANULOCYTE # <0.10 <0.10 x10^3/uL   URINALYSIS, MACROSCOPIC   Result Value Ref Range    COLOR Yellow Yellow, Straw    APPEARANCE Slightly Cloudy (A) Clear, Slightly Hazy    SPECIFIC GRAVITY >=1.030 1.005 - 1.030    PH 5.5 5.0 - 8.5    LEUKOCYTES Trace (A) Negative WBCs/uL    NITRITE Negative Negative    PROTEIN Trace (A) Negative mg/dL    GLUCOSE 144  (A) Negative mg/dL    KETONES Trace (A) Negative mg/dL    UROBILINOGEN 0.2  0.2 mg/dL    BILIRUBIN Negative Negative mg/dL    BLOOD Negative Negative mg/dL   URINALYSIS, MICROSCOPIC   Result Value Ref Range    RBCS 0-2 None, Occasional, 0-2, 3-5 /hpf    WBCS 6-10 (A) None, Occasional, 0-2, 3-5 /hpf    BACTERIA Moderate (A) None /hpf    SQUAMOUS EPITHELIAL 20-50 (A) None, Occasional, 0-2, 3-5 /hpf    MUCOUS Rare (A) None /hpf       I have reviewed all labs ordered.  See course.    Imaging:  * CT ABDOMEN PELVIS W IV CONTRAST   Final Result by Edi, Radresults In (06/19 2143)   No acute process detected in the abdomen or pelvis.      There is an unexplained 1.5 cm smoothly marginated hyperdensity near the right ureterovesical junction. I do not know what this represents. It is near some operative clips in the right pelvis similar to be related to prior surgery.            The CT exam was performed using one or more of the following dose reduction techniques: Automated exposure control, adjustment of the mA and/or kV according to the patient's size, or use of iterative reconstruction technique.         Radiologist location ID: [id number]             I have seen and reviewed all radiology images ordered. See course.    ED Course/ MDM/ Plan:   Patient was triaged, vital signs were obtained, patient was  placed in a room.  On exam, patient is alert and oriented, nontoxic on exam, and in no acute distress. Vitals were reviewed. Work-up  ordered.  Patient given Toradol normal saline and Zofran  ED Course as of 09/01/20 2157   Wynelle Link Sep 01, 2020   2151 * CT ABDOMEN PELVIS W IV CONTRAST  IMPRESSION:  No acute process detected in the abdomen or pelvis.    There is an unexplained 1.5 cm smoothly marginated hyperdensity near the right ureterovesical junction. I do not know what this represents. It is near some operative clips in the right pelvis similar to be related to prior surgery.     [JB]   2155 Patient feels much better and workup is essentially unremarkable.  She was initially hypotensive that has resolved.  She is having abdominal pain.  She would like to be discharged home.  I feel the patient can be discharged home she likely has gastroenteritis.  I will give her some Zofran.  She wants to be COVID tested prior to discharge. [JB]      ED Course User Index  [JB] Iman Reinertsen M, PA-C       Medications Administered in the ED   ondansetron (ZOFRAN ODT) 4 mg rapid dissolve tablet - ED To Go (has no administration in time range)   NS bolus infusion 1,000 mL (1,000 mL Intravenous New Bag/New Syringe 09/01/20 2045)   ondansetron (ZOFRAN) 2 mg/mL injection (4 mg Intravenous Given 09/01/20 2046)   ketorolac (TORADOL) 30 mg/mL injection (15 mg Intravenous Given 09/01/20 2042)   iopamidol (ISOVUE-300) 61% infusion (100 mL Intravenous Given 09/01/20 2131)        Procedures  None    Clinical Impression:   Encounter Diagnoses   Name Primary?   . Nausea and vomiting Yes   . Gastroenteritis            Disposition: Discharged  New Prescriptions    ONDANSETRON (ZOFRAN) 4 MG ORAL TABLET    Take 1 Tablet (4 mg total) by mouth Every 8 hours as needed for Nausea/Vomiting      Melissa Officer, DO  [facility]  PHYSICIAN CLINIC  [address]  [place] [address]  [phone]    In 3 days       BP 139/64   Pulse 70   Temp 36.5 C (97.7 F)   Resp 18   Ht 1.651 m (5\' 5" )   Wt 84.4 kg (186 lb)   SpO2 97%   BMI 30.95 kg/m          Deneka Greenwalt M  Elena Cothern, PA-C       This note was partially created using voice recognition software and is inherently subject to errors including those of syntax and "sound alike " substitutions which may escape proof reading.  In such instances, original meaning may be extrapolated by contextual derivation.

## 2020-09-01 NOTE — ED Triage Notes (Signed)
N/V & general weakness since about Thursday per EMS. Pt got nauseated about 5 mins PTA and was given oral Zofran, IV attempt PTA.

## 2020-09-01 NOTE — Discharge Instructions (Addendum)
Discharge home  Drink plenty of fluids  Take Zofran as needed  Return to the emergency department for any pain fever nausea vomiting diarrhea or any symptoms of concern

## 2020-09-02 LAB — GOLD TOP TUBE

## 2020-09-02 NOTE — ED Nurses Note (Signed)
AVS given, pt discharged home. Educated pt on follow up care and medications. Verbalized understanding. Pt wheeled out to POV

## 2020-09-21 ENCOUNTER — Other Ambulatory Visit: Payer: Self-pay | Admitting: Adult Health Nurse Practitioner

## 2020-09-21 DIAGNOSIS — I480 Paroxysmal atrial fibrillation: Secondary | ICD-10-CM

## 2020-09-24 ENCOUNTER — Other Ambulatory Visit: Payer: Self-pay

## 2020-09-24 ENCOUNTER — Ambulatory Visit: Payer: PPO

## 2020-09-24 DIAGNOSIS — I4891 Unspecified atrial fibrillation: Secondary | ICD-10-CM | POA: Diagnosis not present

## 2020-09-24 DIAGNOSIS — I2781 Cor pulmonale (chronic): Secondary | ICD-10-CM | POA: Diagnosis not present

## 2020-09-24 DIAGNOSIS — I10 Essential (primary) hypertension: Secondary | ICD-10-CM | POA: Diagnosis not present

## 2020-09-28 NOTE — Progress Notes (Signed)
Echocardiogram 09/24/2020: Normal LV systolic function with EF 55%. Left ventricle cavity is normal in size. Moderate concentric hypertrophy of the left ventricle. Normal global wall motion. Doppler evidence of grade I (impaired) diastolic dysfunction, normal LAP. Calculated EF 55%. Left atrial cavity is moderately dilated. Right atrial cavity is mildly dilated. Moderate (Grade II) mitral regurgitation. Moderate tricuspid regurgitation. Mild to moderate pulmonary hypertension. Estimated pulmonary artery systolic pressure 46 mmHg. Insignificant pericardial effusion. Previous study on 09/12/2020 estimated PASP 59 mmHg.

## 2020-10-03 ENCOUNTER — Other Ambulatory Visit: Payer: Self-pay

## 2020-10-03 ENCOUNTER — Encounter: Payer: Self-pay | Admitting: Cardiology

## 2020-10-03 ENCOUNTER — Ambulatory Visit: Payer: PPO | Admitting: Cardiology

## 2020-10-03 VITALS — BP 124/74 | HR 77 | Temp 98.1°F | Resp 17 | Ht 64.0 in | Wt 179.6 lb

## 2020-10-03 DIAGNOSIS — I10 Essential (primary) hypertension: Secondary | ICD-10-CM

## 2020-10-03 DIAGNOSIS — I480 Paroxysmal atrial fibrillation: Secondary | ICD-10-CM

## 2020-10-03 DIAGNOSIS — I2781 Cor pulmonale (chronic): Secondary | ICD-10-CM

## 2020-10-03 NOTE — Progress Notes (Signed)
Primary Physician/Referring:  Unk Pinto, MD  Patient ID: Lori [name], female    DOB: [birth year], 80 y.o.   MRN: [id number]  Chief Complaint  Patient presents with   Follow-up    1 YEAR   Hypertension   HPI:    Lori [name]  is a 80 y.o. Caucasian female with multiple medical comorbidity, has hypertension, hyperlipidemia, prediabetes mellitus, history of cerebral aneurysm SP coiling in 2001, paroxysmal atrial fibrillation jan 2019, history of pulmonary embolism in Nov 2019 and GI bleed related to severe esophagitis and gastritis from NSAID use.  She was discharged home on Eliquis after it was felt appropriate in view of cardioembolic risk.  She is presently doing well and is tolerating anticoagulation with Eliquis without bleeding diathesis.  Blood pressure per patient is well controlled.  She denies any dyspnea or leg edema, no PND or orthopnea.  She has lost 20 pounds in weight unfortunately has gained it back.  Past Medical History:  Diagnosis Date   Anemia    Arthritis    Benign neoplasm of sigmoid colon    Deafness    left ear per pt    Dyspnea    pt reports with exertion due to hip and knees    Dysrhythmia    afib    History of bleeding ulcers    2019 received 3-4 units of blood    Hyperlipidemia    Hypertension    Pericardial effusion 02/04/2018   Pre-diabetes    per Dr Einar Gip note of 09/28/19 pt denies at preop visit on 10/06/19    Prediabetes    Primary localized osteoarthritis of right hip 09/26/2019   Pulmonary embolism ([place])    hx of 2019    Vertigo    Vitamin D deficiency    Past Surgical History:  Procedure Laterality Date   BIOPSY  02/05/2018   Procedure: BIOPSY;  Surgeon: Ladene Artist, MD;  Location: [facility];  Service: Endoscopy;;   CATARACT EXTRACTION W/ INTRAOCULAR LENS  IMPLANT, BILATERAL  2009   CEREBRAL ANEURYSM REPAIR     COLONOSCOPY N/A 02/05/2018   Procedure: COLONOSCOPY;  Surgeon: Ladene Artist, MD;  Location: [facility];   Service: Endoscopy;  Laterality: N/A;   ESOPHAGOGASTRODUODENOSCOPY N/A 02/05/2018   Procedure: ESOPHAGOGASTRODUODENOSCOPY (EGD);  Surgeon: Ladene Artist, MD;  Location: [facility];  Service: Endoscopy;  Laterality: N/A;   FOOT SURGERY  1993   POLYPECTOMY  02/05/2018   Procedure: POLYPECTOMY;  Surgeon: Ladene Artist, MD;  Location: [facility];  Service: Endoscopy;;   TOTAL HIP ARTHROPLASTY Right 10/17/2019   Procedure: TOTAL HIP ARTHROPLASTY ANTERIOR APPROACH;  Surgeon: Renette Butters, MD;  Location: [facility];  Service: Orthopedics;  Laterality: Right;   TUBAL LIGATION     Family History  Problem Relation Age of Onset   Hypertension Mother    Diabetes Mother    Cancer Mother        Pancreatic   Cancer Father        Skin    Social History   Tobacco Use   Smoking status: Former    Types: Cigarettes    Quit date: 08/07/1990    Years since quitting: 30.1   Smokeless tobacco: Never  Substance Use Topics   Alcohol use: No    Alcohol/week: 0.0 standard drinks   Marital Status: Divorced   ROS  Review of Systems  Cardiovascular:  Negative for dyspnea on exertion, leg swelling and syncope.  Musculoskeletal:  Positive for  back pain and joint pain.  Gastrointestinal:  Negative for melena.  Objective  Blood pressure 124/74, pulse 77, temperature 98.1 F (36.7 C), temperature source Temporal, resp. rate 17, height 5\' 4"  (1.626 m), weight 179 lb 9.6 oz (81.5 kg), SpO2 98 %.  Vitals with BMI 10/03/2020 07/02/2020 04/22/2020  Height 5\' 4"  5\' 4"  5\' 4"   Weight 179 lbs 10 oz 176 lbs 6 oz 175 lbs 13 oz  BMI 30.81 53.97 67.34  Systolic 193 790 240  Diastolic 74 86 80  Pulse 77 60 75     Physical Exam Constitutional:      General: She is not in acute distress.    Appearance: She is well-developed.  Cardiovascular:     Rate and Rhythm: Normal rate and regular rhythm.     Pulses: Intact distal pulses.     Heart sounds: No murmur heard.   No gallop.     Comments: No leg edema, no  JVD.  Pulmonary:     Effort: Pulmonary effort is normal. No accessory muscle usage.     Breath sounds: Normal breath sounds.  Abdominal:     General: Bowel sounds are normal.     Palpations: Abdomen is soft.   Laboratory examination:   Recent Labs    01/02/20 1438 04/03/20 1205 07/02/20 1519  NA 143 143 142  K 4.6 4.2 4.7  CL 107 106 107  CO2 29 29 26   GLUCOSE 98 165* 94  BUN 24 18 25   CREATININE 1.22* 1.03* 0.91  CALCIUM 9.4 9.5 9.6  GFRNONAA 43* 52* 61  GFRAA 49* 61 71   CrCl cannot be calculated (Patient's most recent lab result is older than the maximum 21 days allowed.).  CMP Latest Ref Rng & Units 07/02/2020 04/03/2020 01/02/2020  Glucose 65 - 99 mg/dL 94 165(H) 98  BUN 7 - 25 mg/dL 25 18 24   Creatinine 0.60 - 0.93 mg/dL 0.91 1.03(H) 1.22(H)  Sodium 135 - 146 mmol/L 142 143 143  Potassium 3.5 - 5.3 mmol/L 4.7 4.2 4.6  Chloride 98 - 110 mmol/L 107 106 107  CO2 20 - 32 mmol/L 26 29 29   Calcium 8.6 - 10.4 mg/dL 9.6 9.5 9.4  Total Protein 6.1 - 8.1 g/dL 6.4 6.4 6.1  Total Bilirubin 0.2 - 1.2 mg/dL 0.3 0.4 0.3  Alkaline Phos 33 - 130 U/L - - -  AST 10 - 35 U/L 11 15 13   ALT 6 - 29 U/L 7 10 7    CBC Latest Ref Rng & Units 07/02/2020 04/03/2020 01/02/2020  WBC 3.8 - 10.8 Thousand/uL 6.5 6.9 5.6  Hemoglobin 11.7 - 15.5 g/dL 12.0 14.2 12.8  Hematocrit 35.0 - 45.0 % 38.1 42.7 39.1  Platelets 140 - 400 Thousand/uL 224 213 220   Lipid Panel Recent Labs    01/02/20 1438 04/03/20 1205 07/02/20 1519  CHOL 155 170 180  TRIG 187* 164* 164*  LDLCALC 81 93 102*  HDL 46* 51 50  CHOLHDL 3.4 3.3 3.6    HEMOGLOBIN A1C Lab Results  Component Value Date   HGBA1C 5.3 07/02/2020   MPG 105 07/02/2020   TSH Recent Labs    01/02/20 1438 07/02/20 1519  TSH 2.97 2.31   Medications and allergies  No Known Allergies   Current Outpatient Medications  Medication Instructions   acetaminophen (TYLENOL) 1,000 mg, Oral, Every 6 hours PRN   apixaban (ELIQUIS) 5 MG TABS tablet  Take  1 tablet  2 x /day  to Prevent Blood Clots /  Patient knows to take by mouth   baclofen (LIORESAL) 10 mg, Oral, 2 times daily   Cholecalciferol 1,000 Units, Oral, Daily   diazepam (VALIUM) 5 MG tablet Take 1/2 to 1 tablet 2 to 3 x /day for Dizziness / Vertigo   ferrous sulfate 325 mg, Oral, 3 times weekly   gabapentin (NEURONTIN) 300 MG capsule Take 1 capsule 3 x /day for Nerve Pain or Sleep   metoprolol tartrate (LOPRESSOR) 25 MG tablet TAKE 1 TABLET BY MOUTH DAILY FOR BLOOD PRESSURE AND HEART   pantoprazole (PROTONIX) 40 MG tablet 1 tablet, Oral, Daily   Radiology:   CT Chest 02/03/2018: 1. There is a an acute segmental pulmonary embolus within the left lower lobe pulmonary arteries. 2. No large area of pulmonary consolidation. 3. No mediastinal or hilar mass.  Cardiac Studies:   Lower Venous Study 02/04/2018: Indications: Pulmonary embolism. Right: There is no evidence of deep vein thrombosis in the lower extremity. No cystic structure found in the popliteal fossa.  Left: There is no evidence of deep vein thrombosis in the lower extremity. No cystic structure found in the popliteal fossa.   Lexiscan Tetrofosmin stress test 08/30/2019: Lexiscan nuclear stress test performed using 1-day protocol. Stress EKG is non-diagnostic, as this is pharmacological stress test. Normal myocardial perfusion. Stress LVEF 58%. Low risk study.  Echocardiogram 09/24/2020: Normal LV systolic function with EF 55%. Left ventricle cavity is normal in size. Moderate concentric hypertrophy of the left ventricle. Normal global wall motion. Doppler evidence of grade I (impaired) diastolic dysfunction, normal LAP. Calculated EF 55%. Left atrial cavity is moderately dilated. Right atrial cavity is mildly dilated. Moderate (Grade II) mitral regurgitation. Moderate tricuspid regurgitation. Mild to moderate pulmonary hypertension. Estimated pulmonary artery systolic pressure 46 mmHg. Insignificant pericardial  effusion. Previous study on 09/12/2020 estimated PASP 59 mmHg.   EKG  EKG 10/03/2020: Normal sinus rhythm at rate of 65 bpm, normal axis, no evidence of ischemia, normal QT interval.  No significant change from 08/17/2019.  EKG 04/09/2017: Atrial fibrillation with rapid ventricular response.  Nonspecific ST-T abnormality.  Assessment     ICD-10-CM   1. Paroxysmal atrial fibrillation ([name])  I48.0 EKG 12-Lead    2. Essential hypertension  I10     3. Cor pulmonale (chronic) ([name])  I27.81       Recommendations:   Lori [name]  is a 80 y.o. Caucasian female with multiple medical comorbidity, has hypertension, hyperlipidemia, prediabetes mellitus, history of cerebral aneurysm SP coiling in 2001, paroxysmal atrial fibrillation first documented in January 2019, history of pulmonary embolism in 2019 and GI bleed related to severe esophagitis and gastritis from NSAID use.  She underwent right hip arthroplasty on 10/17/2019 without any periprocedural complications.  She is presently doing well and has recuperated well however she has gained about 20 pounds in weight over the past 6 months or so.  We discussed weight loss, elevated lipids probably related to weight gain.  She is tolerating anticoagulation with Eliquis without bleeding diathesis.  No melena, CBC has remained stable.  She does need follow-up on renal function as BMP was done >6 months ago.  Patient will follow up with her PCP regarding this.  She is maintaining sinus rhythm.  She is on low-dose of metoprolol with excellent control of blood pressure as well.  Reviewed her echocardiogram, suspect cor pulmonale secondary to obesity and also prior history of pulmonary embolism.  Pulmonary pressures have remained stable and there is no clinical evidence of right-sided heart failure.  I will see her back in a year or sooner if problems.   Adrian Prows, MD, [facility] 10/03/2020, 1:15 PM Office: [phone]

## 2020-10-15 ENCOUNTER — Other Ambulatory Visit: Payer: Self-pay | Admitting: Adult Health

## 2020-10-15 DIAGNOSIS — I10 Essential (primary) hypertension: Secondary | ICD-10-CM

## 2020-12-04 ENCOUNTER — Other Ambulatory Visit: Payer: Self-pay | Admitting: Internal Medicine

## 2020-12-04 DIAGNOSIS — Z79899 Other long term (current) drug therapy: Secondary | ICD-10-CM

## 2021-01-06 ENCOUNTER — Other Ambulatory Visit: Payer: Self-pay | Admitting: Adult Health

## 2021-01-06 DIAGNOSIS — I10 Essential (primary) hypertension: Secondary | ICD-10-CM

## 2021-01-09 NOTE — Progress Notes (Signed)
FOLLOW UP  Assessment and Plan:   Paroxsymal Atrial Fibrillation ([name]) Continue Eliquis and metoprolol Continue to follow with cardiology Dr Einar Gip  Thrombophilia On Eliquis, avoid NSAIDs  Senile Purpura R/t elequis and age; Discussed process, protect skin, sunscreen  GERD Anticipate PPI and iron supplement indefinitely per GI Monitor magnesium, vitamin D Avoid NSAIDs  Hypertension Well controlled with current medications  Monitor blood pressure at home; patient to call if consistently greater than 130/80 Continue DASH diet.   Reminder to go to the ER if any CP, SOB, nausea, dizziness, severe HA, changes vision/speech, left arm numbness and tingling and jaw pain. CBC  Cholesterol Currently at goal;  Continue low cholesterol diet and exercise.  Check lipid panel.  CMP  Abnormal Glucose Continue diet and exercise.  Perform daily foot/skin check, notify office of any concerning changes.  Check A1C  Overweight Long discussion about weight loss, diet, and exercise Recommended diet heavy in fruits and veggies and low in animal meats, cheeses, and dairy products, appropriate calorie intake  Medication Management Continued  Vertigo Uses diazepam for vertigo which has been longstanding and it does help, continue  Vitamin D Def At goal at last visit; continue supplementation to maintain goal of 60-100 Defer Vit D level  Continue diet and meds as discussed. Further disposition pending results of labs. Discussed med's effects and SE's.   Over 30 minutes of exam, counseling, chart review, and critical decision making was performed.   Future Appointments  Date Time Provider [place]  07/04/2021 10:00 AM Unk Pinto, MD [facility] None  10/03/2021  1:00 PM Adrian Prows, MD [facility] None    ----------------------------------------------------------------------------------------------------------------------  HPI 80 y.o. female  presents for 3 month follow up  on hypertension, cholesterol, diabetes, weight and vitamin D deficiency.    She had right hip replaced 1 year ago and has severe arthritis in hands and spinal stenosis- these all limit her activity. Back pain persists as well as hand pain.   BMI is Body mass index is 31.58 kg/m., she has not been working on diet and exercise. Wt Readings from Last 3 Encounters:  01/14/21 184 lb (83.5 kg)  10/03/20 179 lb 9.6 oz (81.5 kg)  07/02/20 176 lb 6.4 oz (80 kg)    Her blood pressure has been controlled at home, today their BP is BP: 122/60  She does workout. She denies chest pain, shortness of breath, dizziness.   She is not on cholesterol medication  . Her cholesterol is not at goal. The cholesterol last visit was:   Lab Results  Component Value Date   CHOL 180 07/02/2020   HDL 50 07/02/2020   LDLCALC 102 (H) 07/02/2020   TRIG 164 (H) 07/02/2020   CHOLHDL 3.6 07/02/2020    She has not been working on diet and exercise for abnormal glucose Last A1C in the office was:  Lab Results  Component Value Date   HGBA1C 5.3 07/02/2020   Patient is on Vitamin D supplement.   Lab Results  Component Value Date   VD25OH 48 07/02/2020        Current Medications:  Current Outpatient Medications on File Prior to Visit  Medication Sig   acetaminophen (TYLENOL) 500 MG tablet Take 1,000 mg by mouth every 6 (six) hours as needed for mild pain or moderate pain.   apixaban (ELIQUIS) 5 MG TABS tablet Take  1 tablet  2 x /day  to Prevent Blood Clots / Patient knows to take by mouth   Cholecalciferol 25  MCG (1000 UT) capsule Take 1,000 Units by mouth daily.    diazepam (VALIUM) 5 MG tablet Take 1/2 to 1 tablet 2 to 3 x /day for Dizziness / Vertigo   gabapentin (NEURONTIN) 300 MG capsule TAKE 1 CAPSULE BY MOUTH 3 TIMES DAILY FOR NERVE PAIN OR SLEEP   metoprolol tartrate (LOPRESSOR) 25 MG tablet TAKE 1 TABLET BY MOUTH DAILY FOR BLOOD PRESSURE AND HEART   pantoprazole (PROTONIX) 40 MG tablet TAKE 1 TABLET BY  MOUTH DAILY TO PREVENT HEARTBURN AND INDIGESTION   baclofen (LIORESAL) 10 MG tablet Take 10 mg by mouth 2 (two) times daily. (Patient not taking: Reported on 01/14/2021)   ferrous sulfate 325 (65 FE) MG EC tablet Take 325 mg by mouth 3 (three) times a week.  (Patient not taking: Reported on 01/14/2021)   No current facility-administered medications on file prior to visit.     Allergies: No Known Allergies   Medical History:  Past Medical History:  Diagnosis Date   Anemia    Arthritis    Benign neoplasm of sigmoid colon    Deafness    left ear per pt    Dyspnea    pt reports with exertion due to hip and knees    Dysrhythmia    afib    History of bleeding ulcers    2019 received 3-4 units of blood    Hyperlipidemia    Hypertension    Pericardial effusion 02/04/2018   Pre-diabetes    per Dr Einar Gip note of 09/28/19 pt denies at preop visit on 10/06/19    Prediabetes    Primary localized osteoarthritis of right hip 09/26/2019   Pulmonary embolism ([place])    hx of 2019    Vertigo    Vitamin D deficiency    Family history- Reviewed and unchanged Social history- Reviewed and unchanged   Review of Systems:  Review of Systems  Constitutional:  Positive for malaise/fatigue (fatigue). Negative for chills, fever and weight loss.  HENT:  Negative for congestion and hearing loss.   Eyes:  Negative for blurred vision and double vision.  Respiratory:  Negative for cough and shortness of breath.   Cardiovascular:  Negative for chest pain, palpitations, orthopnea and leg swelling.  Gastrointestinal:  Negative for abdominal pain, constipation, diarrhea, heartburn, nausea and vomiting.  Genitourinary:  Negative for dysuria.  Musculoskeletal:  Positive for back pain and joint pain. Negative for falls and myalgias.  Skin:  Negative for rash.  Neurological:  Positive for dizziness and sensory change (feet). Negative for tingling, tremors, loss of consciousness and headaches.  Endo/Heme/Allergies:   Bruises/bleeds easily.  Psychiatric/Behavioral:  Negative for depression, memory loss and suicidal ideas.      Physical Exam: BP 122/60   Pulse 76   Temp 97.7 F (36.5 C)   Wt 184 lb (83.5 kg)   SpO2 96%   BMI 31.58 kg/m  Wt Readings from Last 3 Encounters:  01/14/21 184 lb (83.5 kg)  10/03/20 179 lb 9.6 oz (81.5 kg)  07/02/20 176 lb 6.4 oz (80 kg)   General Appearance: Well nourished, in no apparent distress. Eyes: PERRLA, EOMs, conjunctiva no swelling or erythema Sinuses: No Frontal/maxillary tenderness ENT/Mouth: Ext aud canals clear, TMs without erythema, bulging. No erythema, swelling, or exudate on post pharynx.  Tonsils not swollen or erythematous. Hearing normal.  Neck: Supple, thyroid normal.  Respiratory: Respiratory effort normal, BS equal bilaterally without rales, rhonchi, wheezing or stridor.  Cardio: RRR with no MRGs. Brisk peripheral pulses without edema.  Abdomen: Soft, + BS.  Non tender, no guarding, rebound, hernias, masses. Lymphatics: Non tender without lymphadenopathy.  Musculoskeletal: Full ROM, 5/5 strength, Shuffling gait Skin: Warm, dry without rashes, lesions, ecchymosis.  Neuro: Cranial nerves intact. No cerebellar symptoms.  Psych: Awake and oriented X 3, normal affect, Insight and Judgment appropriate.    Magda Bernheim, NP 2:36 PM [facility]

## 2021-01-14 ENCOUNTER — Other Ambulatory Visit: Payer: Self-pay

## 2021-01-14 ENCOUNTER — Ambulatory Visit (INDEPENDENT_AMBULATORY_CARE_PROVIDER_SITE_OTHER): Payer: PPO | Admitting: Nurse Practitioner

## 2021-01-14 ENCOUNTER — Encounter: Payer: Self-pay | Admitting: Nurse Practitioner

## 2021-01-14 VITALS — BP 122/60 | HR 76 | Temp 97.7°F | Wt 184.0 lb

## 2021-01-14 DIAGNOSIS — D6859 Other primary thrombophilia: Secondary | ICD-10-CM

## 2021-01-14 DIAGNOSIS — I480 Paroxysmal atrial fibrillation: Secondary | ICD-10-CM

## 2021-01-14 DIAGNOSIS — R42 Dizziness and giddiness: Secondary | ICD-10-CM

## 2021-01-14 DIAGNOSIS — I10 Essential (primary) hypertension: Secondary | ICD-10-CM

## 2021-01-14 DIAGNOSIS — R7309 Other abnormal glucose: Secondary | ICD-10-CM | POA: Diagnosis not present

## 2021-01-14 DIAGNOSIS — K219 Gastro-esophageal reflux disease without esophagitis: Secondary | ICD-10-CM | POA: Diagnosis not present

## 2021-01-14 DIAGNOSIS — E782 Mixed hyperlipidemia: Secondary | ICD-10-CM | POA: Diagnosis not present

## 2021-01-14 DIAGNOSIS — S069XAA Unspecified intracranial injury with loss of consciousness status unknown, initial encounter: Secondary | ICD-10-CM

## 2021-01-14 DIAGNOSIS — D649 Anemia, unspecified: Secondary | ICD-10-CM | POA: Diagnosis not present

## 2021-01-14 DIAGNOSIS — E663 Overweight: Secondary | ICD-10-CM

## 2021-01-14 DIAGNOSIS — D692 Other nonthrombocytopenic purpura: Secondary | ICD-10-CM

## 2021-01-14 DIAGNOSIS — Z79899 Other long term (current) drug therapy: Secondary | ICD-10-CM

## 2021-01-14 DIAGNOSIS — E559 Vitamin D deficiency, unspecified: Secondary | ICD-10-CM

## 2021-01-14 MED ORDER — DIAZEPAM 5 MG PO TABS: ORAL_TABLET | ORAL | 0 refills | Status: DC

## 2021-01-14 NOTE — Patient Instructions (Signed)

## 2021-01-16 LAB — CBC WITH DIFFERENTIAL/PLATELET
Absolute Monocytes: 630 cells/uL (ref 200–950)
Basophils Absolute: 60 cells/uL (ref 0–200)
Basophils Relative: 1 %
Eosinophils Absolute: 42 cells/uL (ref 15–500)
Eosinophils Relative: 0.7 %
HCT: 32.8 % — ABNORMAL LOW (ref 35.0–45.0)
Hemoglobin: 10.2 g/dL — ABNORMAL LOW (ref 11.7–15.5)
Lymphs Abs: 1206 cells/uL (ref 850–3900)
MCH: 27.1 pg (ref 27.0–33.0)
MCHC: 31.1 g/dL — ABNORMAL LOW (ref 32.0–36.0)
MCV: 87.2 fL (ref 80.0–100.0)
MPV: 12.9 fL — ABNORMAL HIGH (ref 7.5–12.5)
Monocytes Relative: 10.5 %
Neutro Abs: 4062 cells/uL (ref 1500–7800)
Neutrophils Relative %: 67.7 %
Platelets: 225 10*3/uL (ref 140–400)
RBC: 3.76 10*6/uL — ABNORMAL LOW (ref 3.80–5.10)
RDW: 13.6 % (ref 11.0–15.0)
Total Lymphocyte: 20.1 %
WBC: 6 10*3/uL (ref 3.8–10.8)

## 2021-01-16 LAB — LIPID PANEL
Cholesterol: 167 mg/dL (ref <200)
HDL: 45 mg/dL — ABNORMAL LOW (ref >=50)
LDL Cholesterol (Calc): 92 mg/dL (calc)
Non-HDL Cholesterol (Calc): 122 mg/dL (calc) (ref <130)
Total CHOL/HDL Ratio: 3.7 (calc) (ref <5.0)
Triglycerides: 200 mg/dL — ABNORMAL HIGH (ref <150)

## 2021-01-16 LAB — TEST AUTHORIZATION

## 2021-01-16 LAB — COMPLETE METABOLIC PANEL WITH GFR
AG Ratio: 2.3 (calc) (ref 1.0–2.5)
ALT: 8 U/L (ref 6–29)
AST: 13 U/L (ref 10–35)
Albumin: 4.3 g/dL (ref 3.6–5.1)
Alkaline phosphatase (APISO): 88 U/L (ref 37–153)
BUN/Creatinine Ratio: 20 (calc) (ref 6–22)
BUN: 21 mg/dL (ref 7–25)
CO2: 27 mmol/L (ref 20–32)
Calcium: 9.5 mg/dL (ref 8.6–10.4)
Chloride: 109 mmol/L (ref 98–110)
Creat: 1.03 mg/dL — ABNORMAL HIGH (ref 0.60–1.00)
Globulin: 1.9 g/dL (calc) (ref 1.9–3.7)
Glucose, Bld: 70 mg/dL (ref 65–99)
Potassium: 4.6 mmol/L (ref 3.5–5.3)
Sodium: 145 mmol/L (ref 135–146)
Total Bilirubin: 0.3 mg/dL (ref 0.2–1.2)
Total Protein: 6.2 g/dL (ref 6.1–8.1)
eGFR: 56 mL/min/{1.73_m2} — ABNORMAL LOW (ref >=60)

## 2021-01-16 LAB — IRON,TIBC AND FERRITIN PANEL
%SAT: 8 % (calc) — ABNORMAL LOW (ref 16–45)
Ferritin: 5 ng/mL — ABNORMAL LOW (ref 16–288)
Iron: 32 ug/dL — ABNORMAL LOW (ref 45–160)
TIBC: 424 mcg/dL (calc) (ref 250–450)

## 2021-01-16 LAB — HEMOGLOBIN A1C
Hgb A1c MFr Bld: 5.4 % of total Hgb (ref <5.7)
Mean Plasma Glucose: 108 mg/dL
eAG (mmol/L): 6 mmol/L

## 2021-01-17 ENCOUNTER — Encounter: Payer: Self-pay | Admitting: Internal Medicine

## 2021-01-24 ENCOUNTER — Telehealth: Payer: Self-pay | Admitting: Internal Medicine

## 2021-01-24 NOTE — Chronic Care Management (AMB) (Signed)
  Chronic Care Management   Note  01/24/2021 Name: INDONESIA MCKEOUGH MRN: [id number] DOB: [birth year]  LUCILLIE KIESEL is a 80 y.o. year old female who is a primary care patient of Unk Pinto, MD. I reached out to [name] by phone today in response to a referral sent by Ms. Radford Pax PCP, Unk Pinto, MD.   Ms. Hutmacher was given information about Chronic Care Management services today including:  CCM service includes personalized support from designated clinical staff supervised by her physician, including individualized plan of care and coordination with other care providers 24/7 contact phone numbers for assistance for urgent and routine care needs. Service will only be billed when office clinical staff spend 20 minutes or more in a month to coordinate care. Only one practitioner may furnish and bill the service in a calendar month. The patient may stop CCM services at any time (effective at the end of the month) by phone call to the office staff.   KAY SCOGGINS verbally agreed to assistance and services provided by embedded care coordination/care management team today.  Follow up plan:  [place]jana Secretary/administrator

## 2021-02-26 ENCOUNTER — Telehealth: Payer: PPO | Admitting: Pharmacist

## 2021-04-07 ENCOUNTER — Encounter: Payer: PPO | Admitting: Adult Health Nurse Practitioner

## 2021-04-12 ENCOUNTER — Other Ambulatory Visit: Payer: Self-pay | Admitting: Nurse Practitioner

## 2021-04-12 DIAGNOSIS — S069XAA Unspecified intracranial injury with loss of consciousness status unknown, initial encounter: Secondary | ICD-10-CM

## 2021-04-14 ENCOUNTER — Other Ambulatory Visit: Payer: Self-pay

## 2021-04-14 DIAGNOSIS — I10 Essential (primary) hypertension: Secondary | ICD-10-CM

## 2021-04-14 MED ORDER — METOPROLOL TARTRATE 25 MG PO TABS: ORAL_TABLET | ORAL | 0 refills | Status: DC

## 2021-06-03 ENCOUNTER — Other Ambulatory Visit: Payer: Self-pay | Admitting: Internal Medicine

## 2021-06-03 DIAGNOSIS — I480 Paroxysmal atrial fibrillation: Secondary | ICD-10-CM

## 2021-06-03 MED ORDER — APIXABAN 5 MG PO TABS: ORAL_TABLET | ORAL | 3 refills | Status: DC

## 2021-06-05 ENCOUNTER — Emergency Department (HOSPITAL_COMMUNITY): Payer: Medicare Other

## 2021-06-05 ENCOUNTER — Emergency Department
Admission: EM | Admit: 2021-06-05 | Discharge: 2021-06-05 | Disposition: A | Discharge status: Home / Self Care | Payer: Medicare Other | Attending: Physician Assistant | Admitting: Physician Assistant

## 2021-06-05 ENCOUNTER — Other Ambulatory Visit: Payer: Self-pay

## 2021-06-05 ENCOUNTER — Encounter (HOSPITAL_COMMUNITY): Payer: Self-pay | Admitting: NURSE PRACTITIONER

## 2021-06-05 DIAGNOSIS — Z23 Encounter for immunization: Secondary | ICD-10-CM | POA: Insufficient documentation

## 2021-06-05 DIAGNOSIS — N3000 Acute cystitis without hematuria: Secondary | ICD-10-CM | POA: Insufficient documentation

## 2021-06-05 DIAGNOSIS — W208XXA Other cause of strike by thrown, projected or falling object, initial encounter: Secondary | ICD-10-CM | POA: Insufficient documentation

## 2021-06-05 DIAGNOSIS — S20211A Contusion of right front wall of thorax, initial encounter: Secondary | ICD-10-CM | POA: Insufficient documentation

## 2021-06-05 DIAGNOSIS — S90212A Contusion of left great toe with damage to nail, initial encounter: Secondary | ICD-10-CM | POA: Insufficient documentation

## 2021-06-05 LAB — URINALYSIS, MICROSCOPIC

## 2021-06-05 LAB — URINALYSIS, MACROSCOPIC
BILIRUBIN: NEGATIVE mg/dL
BLOOD: NEGATIVE mg/dL
GLUCOSE: 100 mg/dL — AB
NITRITE: POSITIVE — AB
PH: 5.5 (ref 5.0–8.5)
PROTEIN: NEGATIVE mg/dL
SPECIFIC GRAVITY: 1.025 (ref 1.005–1.030)
UROBILINOGEN: 0.2 mg/dL

## 2021-06-05 MED ORDER — CEFDINIR 300 MG CAPSULE
300.0000 mg | ORAL_CAPSULE | Freq: Two times a day (BID) | ORAL | 0 refills | Status: AC
Start: 2021-06-05 — End: 2021-06-15

## 2021-06-05 MED ORDER — DIPHTH,PERTUSSIS(ACEL),TETANUS 2.5 LF UNIT-8 MCG-5 LF/0.5ML IM SYRINGE
0.5000 mL | INJECTION | INTRAMUSCULAR | Status: AC
Start: 2021-06-05 — End: 2021-06-05
  Administered 2021-06-05: 0.5 mL via INTRAMUSCULAR
  Filled 2021-06-05: qty 0.5

## 2021-06-05 MED ORDER — NEOMYCIN-BACITRACN ZN-POLYMYXN 3.5 MG-400 UNIT-5,000 UNIT TOP OINT PKT
1.0000 | TOPICAL_OINTMENT | CUTANEOUS | Status: AC
Start: 2021-06-05 — End: 2021-06-05
  Administered 2021-06-05: 1 via TOPICAL
  Filled 2021-06-05: qty 1

## 2021-06-05 NOTE — ED Triage Notes (Signed)
Patient dropped a flower vase on left foot "a couple weeks" ago. Two nights ago (3/21) at 2300 her toenail fell off, as she was reaching to get her toenail she fell forward onto the bathtub hitting her ribs on the tub. Complains of right rib pain and left foot pain.

## 2021-06-05 NOTE — ED Nurses Note (Signed)
Urine collected and sent to lab.

## 2021-06-05 NOTE — ED Provider Notes (Signed)
Emergency Department  [facility]   06/05/2021     Melissa Wolf  1940-12-27  81 y.o.  female  [place] [address]   [phone] (home)  PCP: Lorita Officer, DO   Date of service:06/05/2021 13:37    Chief Complaint:   Chief Complaint   Patient presents with   . Trauma   . Fall           HPI: This is a 81 y.o. female who presents to the emergency department via POV with complaint of left toe pain and right rib pain.  Patient states that she dropped a flower base on the top of her foot which caused the toenail over great toe to come off.  She noticed some bleeding from her toe and when she stooped over to clean it up, she fell forward, hitting her right ribs on the bath tub.  She denies any other injuries.  She did not hit her head and had no LOC.  She does have increased pain with movement and deep breathing.  She denies fever, chills or sweats, N/V/D.  She is unsure of her last tetanus.  She does follow with podiatry in [place], but has not seen him in about 6 months.              Past Medical History:   Past Medical History:   Diagnosis Date   . Diabetes mellitus, type 2 (CMS [name])    . HTN (hypertension)    . Hyperlipidemia    . Myocardial infarction (CMS [name])    . Wears glasses      (Not in an outpatient encounter)     Past Surgical History:   Past Surgical History:   Procedure Laterality Date   . Hand surgery     . Hx coronary stent placement     . Hx hysterectomy     . Hx tonsillectomy     . Shoulder surgery Bilateral        Social History:   Social History     Tobacco Use   . Smoking status: Never   . Smokeless tobacco: Never   Substance Use Topics   . Alcohol use: Not Currently   . Drug use: Not Currently        Family History:  No family history on file.        Medications Prior to Admission     Prescriptions    aspirin 81 mg Oral Tablet, Chewable    Chew 1 Tablet (81 mg total) Twice daily    atorvastatin (LIPITOR) 40 mg Oral Tablet    Take 1 Tablet (40 mg total) by mouth  Every evening    clopidogrel bisulfate (CLOPIDOGREL ORAL)    Take 40 mg by mouth    insulin aspart (NOVOLOG) 100 unit/mL Subcutaneous Insulin Pen    Inject 0-12 Units under the skin Four times a day - before meals and bedtime    insulin detemir (LEVEMIR FLEXPEN SUBQ)    Inject 40 Units under the skin    isosorbide mononitrate (IMDUR) 60 mg Oral Tablet Sustained Release 24 hr    Take 1 Tablet (60 mg total) by mouth Every morning    MetFORMIN (GLUCOPHAGE) 1,000 mg Oral Tablet    Take 1 Tablet (1,000 mg total) by mouth Twice daily with food    metoprolol succinate (TOPROL-XL) 25 mg Oral Tablet Sustained Release 24 hr    Take 1 Tablet (25 mg total) by mouth Once a  day    ondansetron (ZOFRAN) 4 mg Oral Tablet    Take 1 Tablet (4 mg total) by mouth Every 8 hours as needed for Nausea/Vomiting    pantoprazole (PROTONIX) 40 mg Oral Tablet, Delayed Release (E.C.)    Take 1 Tablet (40 mg total) by mouth Once a day    sertraline (ZOLOFT) 25 mg Oral Tablet    Take 1 Tablet (25 mg total) by mouth Once a day    syringe-needle,insulin,0.5 mL (INSULIN SYRINGE N/A)          Allergies:   Allergies   Allergen Reactions   . Codeine      Pt doesn't remember exact rxn       Above history reviewed with patient.  Allergies, medication list, reviewed.        Physical exam:  Physical Exam  Body mass index is 29.12 kg/m.  ED Triage Vitals [06/05/21 1328]   BP (Non-Invasive) 126/63   Heart Rate 68   Respiratory Rate 18   Temperature 36.4 C (97.6 F)   SpO2 99 %   Weight 79.4 kg (175 lb)   Height 1.651 m ( )       Constitutional: patient is oriented to person, place, and time and well-developed, well-nourished, and in no distress.   HENT:   Head: Normocephalic and atraumatic.   Right Ear: External ear normal.   Left Ear: External ear normal.   Nose: Nose normal.   Mouth/Throat: Oropharynx is clear and moist.   Eyes: Pupils are equal, round, and reactive to light. Conjunctivae and EOM are normal.   Neck: Normal range of motion. Neck supple.    Cardiovascular: Normal rate, regular rhythm, normal heart sounds and intact distal pulses.   Pulmonary/Chest: Effort normal and breath sounds normal. Tenderness to right ribs laterally and posteriorly.  No crepitus.    Abdominal: Soft. Bowel sounds are normal.   Musculoskeletal: Normal range of motion.   Neurological:Patient is alert and oriented to person, place, and time.  Gait normal. GCS score is 15.   Skin: Skin is warm and dry.   Psychiatric: Mood, memory, affect and judgment normal.            The following orders were placed after examining the patient :  Orders Placed This Encounter   . URINE CULTURE,ROUTINE   . XR RIBS RIGHT(WO CHEST)   . * XR FOOT LEFT   . URINALYSIS, MACROSCOPIC AND MICROSCOPIC   . URINALYSIS, MACROSCOPIC   . URINALYSIS, MICROSCOPIC   . INCENTIVE SPIROMETRY - RT INSTRUCT   . diphtheria, pertussis-acell, tetanus (BOOSTRIX) IM injection   . neomycin-bacitracin-polymyxin (NEOSPORIN) topical ointment packet   . cefdinir (OMNICEF) 300 mg Oral Capsule      XR RIBS RIGHT(WO CHEST)   Final Result   No rib fracture identified.                        Radiologist location ID: [id number]         * XR FOOT LEFT   Final Result   No acute fracture.                        Radiologist location ID: [id number]            Results for orders placed or performed during the hospital encounter of 06/05/21 (from the past 12 hour(s))   URINALYSIS, MACROSCOPIC   Result Value Ref Range    COLOR Yellow  Yellow, Straw    APPEARANCE Clear Clear, Slightly Hazy    SPECIFIC GRAVITY 1.025 1.005 - 1.030    PH 5.5 5.0 - 8.5    LEUKOCYTES Moderate (A) Negative WBCs/uL    NITRITE Positive (A) Negative    PROTEIN Negative Negative mg/dL    GLUCOSE 785 (A) Negative mg/dL    KETONES Trace (A) Negative mg/dL    UROBILINOGEN 0.2 0.2 mg/dL    BILIRUBIN Negative Negative mg/dL    BLOOD Negative Negative mg/dL   URINALYSIS, MICROSCOPIC   Result Value Ref Range    RBCS Occasional None, Occasional, 0-2, 3-5 /hpf    WBCS 20-50 (A)  None, Occasional, 0-2, 3-5 /hpf    BACTERIA Moderate (A) None /hpf    SQUAMOUS EPITHELIAL 6-10 (A) None, Occasional, 0-2, 3-5 /hpf    TRANSITIONAL EPITHELIAL Occasional None, Occasional, 0-2, 3-5 /hpf         ED Course:   ED Course as of 06/05/21 1549   Thu Jun 05, 2021   1424 * XR FOOT LEFT  Reviewed by me.  No acute fracture or dislocation.  Degenerative changes noted   1424 XR RIBS RIGHT(WO CHEST)  Reviewed by me.  No acute fracture.   1525 URINALYSIS, MACROSCOPIC AND MICROSCOPIC(!)  Moderate leukocytes, positive nitrites, 100+ glucose, trace ketones, 20-50 wbc's with moderate bacteria.  6-10 squamous epithelial cells   1547 Patient does have nitrite positive UTI and will be started on cefdinir 300 mg b.i.d. for 10 days, which will also help with prevention of infection of the great toe.  Wound care instructions given.  Patient is also encouraged to drink plenty of water and limit caffeine.  Recommend follow-up with PCP in 1 week for repeat UA.  There is no evidence of fracture or dislocation.  Tetanus has been updated.  She will follow-up with her podiatrist in 1 week as well as her PCP.  Return to the ED if symptoms get worse or do not improve.  Patient remained stable and is suitable for discharge.      Medications Administered in the ED   diphtheria, pertussis-acell, tetanus (BOOSTRIX) IM injection (0.5 mL IntraMUSCULAR Given 06/05/21 1355)   neomycin-bacitracin-polymyxin (NEOSPORIN) topical ointment packet (1 Packet Apply Topically Given 06/05/21 1436)        Emergency Department Procedure:    EKG Interpertation:  NA  Procedures    Medical Decision Making  Differential may include rib fracture/contusion, foot fracture, contusion, UTI    Amount and/or Complexity of Data Reviewed  Labs: ordered.  Radiology:  Decision-making details documented in ED Course.  ECG/medicine tests: independent interpretation performed.      Risk  OTC drugs.  Prescription drug management.              Findings and diagnosis discussed  with patient.  Clinical Impression:   Clinical Impression   Acute cystitis without hematuria (Primary)   Contusion of rib on right side, initial encounter   Contusion of left great toe with damage to nail, initial encounter         Disposition: Discharged          No follow-ups on file.   New Prescriptions    CEFDINIR (OMNICEF) 300 MG ORAL CAPSULE    Take 1 Capsule (300 mg total) by mouth Twice daily for 10 days      Lorita Officer, DO  [facility]  PHYSIC[facility]  [address]  [place] [address]  [phone]    In  3 days      [facility]  [address]  [place] [address]  [phone]    As needed, If symptoms worsen      Future Appointments  No future appointments.     BP 112/61   Pulse 70   Temp 36.4 C (97.5 F)   Resp 18   Ht 1.651 m (5\' 5" )   Wt 79.4 kg (175 lb)   SpO2 98%   BMI 29.12 kg/m        Marylee Floras, PA-C3/23/2023              This note was partially created using voice recognition software and is inherently subject to errors including those of syntax and "sound alike " substitutions which may escape proof reading.  In such instances, original meaning may be extrapolated by contextual derivation.

## 2021-06-05 NOTE — ED Nurses Note (Signed)
Discharge instructions given to pt. No questions. Pt exited ED via wheelchair.

## 2021-06-05 NOTE — Discharge Instructions (Addendum)
Your xrays do not show any fracture or dislocation of your toe or ribs.    You have soft tissue bruises to your rib area, as well as your toe/foot.    You have a UTI and are being treated with Cefdinir 300 mg twice a day for 10 days. This will also cover potential toe infection.     Drink plenty of water and avoid caffeine.   Please follow up with your Podiatrist within a week.    Keep foot clean and dry.  Apply neosporin daily with dressing changes.    Rest, limit activity and avoid heavy lifting or strenuous exercise.    You may alternate tylenol and ibuprofen for pain.    I do recommend that you elevate your foot and apply ice, which will help with pain.    You may also apply moist heat to rib for additional pain relief.    You may use your Incentive Spirometry several times a day to prevent pneumonia.

## 2021-06-05 NOTE — ED Nurses Note (Signed)
Pt to XR via wheelchair

## 2021-06-05 NOTE — ED Nurses Note (Signed)
ID confirmed x 2. Left great toe cleansed with saline and dried. Neosporin and telfa dressing applied. Wrapped with kling. Patient requested all toes be covered. Patient assisted up to restroom to collect urine sample. Wipes provided and clean catch instructions discussed.

## 2021-06-07 LAB — URINE CULTURE,ROUTINE: URINE CULTURE: >100000 — AB

## 2021-07-03 NOTE — Progress Notes (Signed)
? ?Annual Screening/Preventative Visit ?& Comprehensive Evaluation &  Examination ? ?Future Appointments  ?Date Time Provider Department  ?07/04/2021 10:00 AM Unk Pinto, MD [facility]  ?10/03/2021  1:00 PM Adrian Prows, MD [facility]  ?07/09/2022 10:00 AM Unk Pinto, MD [facility]  ? ? ?    This very nice 81 y.o. DWF  presents for a Screening /Preventative Visit & comprehensive evaluation and management of multiple medical co-morbidities.  Patient has been followed for HTN, ASHD/Afib, HLD, Prediabetes  and Vitamin D Deficiency. Patient's GERD is controlled on Pantoprazole. Dr Ellene Route follows patient for severe spinal stenosis by Lumbar MRI in Aug 2020.  ? ? ?     HTN predates since  200 9 when she was dx'd with a false (+) Myoview by a negative Heart Cath. In 2019 , she was dx'd with AFib & is followed by Dr Einar Gip .  Patient's BP has been controlled at home and patient denies any cardiac symptoms as chest pain, palpitations, shortness of breath, dizziness or ankle swelling. Today's BP is at goal -  118/68.  ? ? ?    Patient's hyperlipidemia is controlled with diet and medications. Patient denies myalgias or other medication SE's. Last lipids were at goal except elevated Trig's : ? ?Lab Results  ?Component Value Date  ? CHOL 167 01/14/2021  ? HDL 45 (L) 01/14/2021  ? [place] 92 01/14/2021  ? TRIG 200 (H) 01/14/2021  ? CHOLHDL 3.7 01/14/2021  ? ? ? ?    Patient has hx/o prediabetes (A1c 5.9% /2013) and patient denies reactive hypoglycemic symptoms, visual blurring, diabetic polys or paresthesias. Last A1c was at goal : ? ?Lab Results  ?Component Value Date  ? HGBA1C 5.4 01/14/2021  ? ? ? ?    Finally, patient has history of Vitamin D Deficiency ("11" /2008) and last Vitamin D was not at goal (goal  70-100) : ? ?Lab Results  ?Component Value Date  ? VD25OH 48 07/02/2020  ? ?  ? ?Current Outpatient Medications on File Prior to Visit  ?Medication Sig  ? acetaminophen  500 MG tablet Take 1,000 mg every 6 hours as  needed for  pain  ? apixaban  5 MG tablet Take  1 tablet  2 x /day     ? Cholecalciferol 1000 u Take 1,000 Units  daily.   ? diazepam 5 MG tablet TAKE 1/2-1 TABLET 2 TO 3 TIMES DAILY   ? gabapentin 300 MG  TAKE 1 CAPSULE  3 TIMES DAILY  ? metoprolol tartrate 25 MG tablet Take 1 tablet daily   ? pantoprazole  40 MG tablet TAKE 1 TABLET DAILY   ? ? ?No Known Allergies ? ? ?Past Medical History:  ?Diagnosis Date  ? Anemia   ? Arthritis   ? Benign neoplasm of sigmoid colon   ? Deafness   ? left ear per pt   ? Dyspnea   ? pt reports with exertion due to hip and knees   ? Dysrhythmia   ? afib   ? History of bleeding ulcers   ? 2019 received 3-4 units of blood   ? Hyperlipidemia   ? Hypertension   ? Pericardial effusion 02/04/2018  ? Pre-diabetes   ? per Dr Einar Gip note of 09/28/19 pt denies at preop visit on 10/06/19   ? Prediabetes   ? Primary localized osteoarthritis of right hip 09/26/2019  ? Pulmonary embolism ([place])   ? hx of 2019   ? Vertigo   ? Vitamin D deficiency   ? ? ?  Health Maintenance  ?Topic Date Due  ? COVID-19 Vaccine (1) Never done  ? Zoster Vaccines- Shingrix (1 of 2) Never done  ? INFLUENZA VACCINE  10/14/2021  ? TETANUS/TDAP  01/25/2024  ? Pneumonia Vaccine  Completed  ? DEXA SCAN  Completed  ? Hepatitis C Screening  Completed  ? HPV VACCINES  Aged Out  ? ? ? ?Immunization History  ?Administered Date(s) Administered  ? DT  01/24/2014  ? Pneumococcal -13 05/26/2018  ? Pneumococcal -23 10/06/2011  ? Td 04/23/2003  ? ? ?Last EGD & Colon - 02/05/2018 - Dr Fuller Plan - No f/u due to age. ? ? ?  ?Last MGM - 09/15/2012 - refuses further MGM. ? ? ?Past Surgical History:  ?Procedure Laterality Date  ? BIOPSY  02/05/2018  ? BIOPSY;  Ladene Artist, MD  ? CATARACT EXTRACTION W/ INTRAOCULAR LENS  IMPLANT, BILATERAL  2009  ? CEREBRAL ANEURYSM REPAIR    ? COLONOSCOPY N/A 02/05/2018  ? COLONOSCOPY;  Ladene Artist, MD  ? ESOPHAGOGASTRODUODENOSCOPY N/A 02/05/2018  ? ESOPHAGOGASTRODUODENOSCOPY (EGD);  Ladene Artist, MD  ?  FOOT SURGERY  1993  ? POLYPECTOMY  02/05/2018  ?  POLYPECTOMY;  Ladene Artist, MD  ? TOTAL HIP ARTHROPLASTY Right 10/17/2019  ? TOTAL HIP ARTHROPLASTY ANTERIOR APPROACH;  Renette Butters, MD  ? TUBAL LIGATION    ? ? ? ?Family History  ?Problem Relation Age of Onset  ? Hypertension Mother   ? Diabetes Mother   ? Cancer Mother   ?     Pancreatic  ? Cancer Father   ?     Skin  ? ? ? ?Social History  ? ?Tobacco Use  ? Smoking status: Former  ?  Types: Cigarettes  ?  Quit date: 08/07/1990  ?  Years since quitting: 30.9  ? Smokeless tobacco: Never  ?Vaping Use  ? Vaping Use: Never used  ?Substance Use Topics  ? Alcohol use: No  ? Drug use: No  ? ? ? ? ROS ?Constitutional: Denies fever, chills, weight loss/gain, headaches, insomnia,  night sweats, and change in appetite. Does c/o fatigue. ?Eyes: Denies redness, blurred vision, diplopia, discharge, itchy, watery eyes.  ?ENT: Denies discharge, congestion, post nasal drip, epistaxis, sore throat, earache, hearing loss, dental pain, Tinnitus, Vertigo, Sinus pain, snoring.  ?Cardio: Denies chest pain, palpitations, irregular heartbeat, syncope, dyspnea, diaphoresis, orthopnea, PND, claudication, edema ?Respiratory: denies cough, dyspnea, DOE, pleurisy, hoarseness, laryngitis, wheezing.  ?Gastrointestinal: Denies dysphagia, heartburn, reflux, water brash, pain, cramps, nausea, vomiting, bloating, diarrhea, constipation, hematemesis, melena, hematochezia, jaundice, hemorrhoids ?Genitourinary: Denies dysuria, frequency, urgency, nocturia, hesitancy, discharge, hematuria, flank pain ?Breast: Breast lumps, nipple discharge, bleeding.  ?Musculoskeletal: Denies arthralgia, myalgia, stiffness, Jt. Swelling, pain, limp, and strain/sprain. Denies falls. ?Skin: Denies puritis, rash, hives, warts, acne, eczema, changing in skin lesion ?Neuro: No weakness, tremor, incoordination, spasms, paresthesia, pain ?Psychiatric: Denies confusion, memory loss, sensory loss. Denies  Depression. ?Endocrine: Denies change in weight, skin, hair change, nocturia, and paresthesia, diabetic polys, visual blurring, hyper / hypo glycemic episodes.  ?Heme/Lymph: No excessive bleeding, bruising, enlarged lymph nodes. ? ?Physical Exam ? ?BP 118/68   Pulse 68   Temp 97.9 ?F (36.6 ?C)   Resp 16   Ht 5' 4"  (1.626 m)   Wt 181 lb 9.6 oz (82.4 kg)   SpO2 96%   BMI 31.17 kg/m?  ? ?General Appearance: Well nourished, well groomed and in no apparent distress. ? ?Eyes: PERRLA, EOMs, conjunctiva no swelling or erythema, normal fundi and  vessels. ?Sinuses: No frontal/maxillary tenderness ?ENT/Mouth: EACs patent / TMs  nl. Nares clear without erythema, swelling, mucoid exudates. Oral hygiene is good. No erythema, swelling, or exudate. Tongue normal, non-obstructing. Tonsils not swollen or erythematous. Hearing normal.  ?Neck: Supple, thyroid not palpable. No bruits, nodes or JVD. ?Respiratory: Respiratory effort normal.  BS equal and clear bilateral without rales, rhonci, wheezing or stridor. ?Cardio: Heart sounds are normal with regular rate and rhythm and no murmurs, rubs or gallops. Peripheral pulses are normal and equal bilaterally without edema. No aortic or femoral bruits. ?Chest: symmetric with normal excursions and percussion. ?Breasts: Symmetric, without lumps, nipple discharge, retractions, or fibrocystic changes.  ?Abdomen: Flat, soft with bowel sounds active. Nontender, no guarding, rebound, hernias, masses, or organomegaly.  ?Lymphatics: Non tender without lymphadenopathy.  ?Genitourinary:  ?Musculoskeletal: Full ROM all peripheral extremities, joint stability, 5/5 strength, and normal gait. ?Skin: Warm and dry without rashes, lesions, cyanosis, clubbing or  ecchymosis.  ?Neuro: Cranial nerves intact, reflexes equal bilaterally. Normal muscle tone, no cerebellar symptoms. Sensation intact.  ?Pysch: Alert and oriented X 3, normal affect, Insight and Judgment appropriate.  ? ? ?Assessment and  Plan ? ?1. Annual Preventative Screening Examination ? ? ?2. Essential hypertension ? ?- EKG 12-Lead ?- Urinalysis, Routine w reflex microscopic ?- Microalbumin / creatinine urine ratio ?- CBC with Differential/Platelet ?- COMPLETE MET

## 2021-07-03 NOTE — Patient Instructions (Signed)

## 2021-07-04 ENCOUNTER — Other Ambulatory Visit: Payer: Self-pay | Admitting: Internal Medicine

## 2021-07-04 ENCOUNTER — Ambulatory Visit (INDEPENDENT_AMBULATORY_CARE_PROVIDER_SITE_OTHER): Payer: PPO | Admitting: Internal Medicine

## 2021-07-04 ENCOUNTER — Other Ambulatory Visit: Payer: Self-pay | Admitting: Nurse Practitioner

## 2021-07-04 ENCOUNTER — Other Ambulatory Visit: Payer: Self-pay | Admitting: Adult Health

## 2021-07-04 ENCOUNTER — Encounter: Payer: Self-pay | Admitting: Internal Medicine

## 2021-07-04 VITALS — BP 118/68 | HR 68 | Temp 97.9°F | Resp 16 | Ht 64.0 in | Wt 181.6 lb

## 2021-07-04 DIAGNOSIS — E782 Mixed hyperlipidemia: Secondary | ICD-10-CM | POA: Diagnosis not present

## 2021-07-04 DIAGNOSIS — S069XAA Unspecified intracranial injury with loss of consciousness status unknown, initial encounter: Secondary | ICD-10-CM

## 2021-07-04 DIAGNOSIS — Z8249 Family history of ischemic heart disease and other diseases of the circulatory system: Secondary | ICD-10-CM

## 2021-07-04 DIAGNOSIS — Z136 Encounter for screening for cardiovascular disorders: Secondary | ICD-10-CM

## 2021-07-04 DIAGNOSIS — Z0001 Encounter for general adult medical examination with abnormal findings: Secondary | ICD-10-CM

## 2021-07-04 DIAGNOSIS — S069XAS Unspecified intracranial injury with loss of consciousness status unknown, sequela: Secondary | ICD-10-CM

## 2021-07-04 DIAGNOSIS — Z87891 Personal history of nicotine dependence: Secondary | ICD-10-CM

## 2021-07-04 DIAGNOSIS — K219 Gastro-esophageal reflux disease without esophagitis: Secondary | ICD-10-CM | POA: Diagnosis not present

## 2021-07-04 DIAGNOSIS — R7309 Other abnormal glucose: Secondary | ICD-10-CM

## 2021-07-04 DIAGNOSIS — Z0000 Encounter for general adult medical examination without abnormal findings: Secondary | ICD-10-CM | POA: Diagnosis not present

## 2021-07-04 DIAGNOSIS — Z79899 Other long term (current) drug therapy: Secondary | ICD-10-CM | POA: Diagnosis not present

## 2021-07-04 DIAGNOSIS — Z1211 Encounter for screening for malignant neoplasm of colon: Secondary | ICD-10-CM

## 2021-07-04 DIAGNOSIS — E559 Vitamin D deficiency, unspecified: Secondary | ICD-10-CM

## 2021-07-04 DIAGNOSIS — I10 Essential (primary) hypertension: Secondary | ICD-10-CM

## 2021-07-04 DIAGNOSIS — I480 Paroxysmal atrial fibrillation: Secondary | ICD-10-CM | POA: Diagnosis not present

## 2021-07-04 DIAGNOSIS — D6859 Other primary thrombophilia: Secondary | ICD-10-CM

## 2021-07-04 MED ORDER — GABAPENTIN 300 MG PO CAPS: ORAL_CAPSULE | ORAL | 3 refills | Status: DC

## 2021-07-04 MED ORDER — DIAZEPAM 5 MG PO TABS: ORAL_TABLET | ORAL | 0 refills | Status: DC

## 2021-07-05 NOTE — Progress Notes (Signed)
<><><><><><><><><><><><><><><><><><><><><><><><><><><><><><><><><> ?<><><><><><><><><><><><><><><><><><><><><><><><><><><><><><><><><> ?-   Test results slightly outside the reference range are not unusual. ?If there is anything important, I will review this with you,  ?otherwise it is considered normal test values.  ?If you have further questions,  ?please do not hesitate to contact me at the office or via My Chart.  ?<><><><><><><><><><><><><><><><><><><><><><><><><><><><><><><><><> ?<><><><><><><><><><><><><><><><><><><><><><><><><><><><><><><><><> ? ?-  Total Chol  =  118   &   LDL Chol = 53   - Both  Excellent  ? ?- Very low risk for Heart Attack  / Stroke ?<><><><><><><><><><><><><><><><><><><><><><><><><><><><><><><><><> ?<><><><><><><><><><><><><><><><><><><><><><><><><><><><><><><><><> ? ?- A1c - Normal - No Diabetes   - Great  !  ?<><><><><><><><><><><><><><><><><><><><><><><><><><><><><><><><><> ?<><><><><><><><><><><><><><><><><><><><><><><><><><><><><><><><><> ? ?- Vitamin D - 45 is low    Recommend Increase up to 5,000 unit capsule Daily  ? ?<><><><><><><><><><><><><><><><><><><><><><><><><><><><><><><><><> ?<><><><><><><><><><><><><><><><><><><><><><><><><><><><><><><><><> ? ?- All Else - CBC - Kidneys - Electrolytes - Liver - Magnesium & Thyroid   ? ?- all  Normal / OK ?<><><><><><><><><><><><><><><><><><><><><><><><><><><><><><><><><> ?<><><><><><><><><><><><><><><><><><><><><><><><><><><><><><><><><> ? ? ? ? ? ? ? ? ? ? ? ? ? ? ? ? ? ? ? ?

## 2021-07-07 LAB — CBC WITH DIFFERENTIAL/PLATELET
Absolute Monocytes: 858 cells/uL (ref 200–950)
Basophils Absolute: 51 cells/uL (ref 0–200)
Basophils Relative: 0.8 %
Eosinophils Absolute: 58 cells/uL (ref 15–500)
Eosinophils Relative: 0.9 %
HCT: 36.3 % (ref 35.0–45.0)
Hemoglobin: 11.1 g/dL — ABNORMAL LOW (ref 11.7–15.5)
Lymphs Abs: 922 cells/uL (ref 850–3900)
MCH: 26 pg — ABNORMAL LOW (ref 27.0–33.0)
MCHC: 30.6 g/dL — ABNORMAL LOW (ref 32.0–36.0)
MCV: 85 fL (ref 80.0–100.0)
MPV: 12.9 fL — ABNORMAL HIGH (ref 7.5–12.5)
Monocytes Relative: 13.4 %
Neutro Abs: 4512 cells/uL (ref 1500–7800)
Neutrophils Relative %: 70.5 %
Platelets: 182 10*3/uL (ref 140–400)
RBC: 4.27 10*6/uL (ref 3.80–5.10)
RDW: 14.9 % (ref 11.0–15.0)
Total Lymphocyte: 14.4 %
WBC: 6.4 10*3/uL (ref 3.8–10.8)

## 2021-07-07 LAB — COMPLETE METABOLIC PANEL WITH GFR
AG Ratio: 2.3 (calc) (ref 1.0–2.5)
ALT: 7 U/L (ref 6–29)
AST: 13 U/L (ref 10–35)
Albumin: 3.9 g/dL (ref 3.6–5.1)
Alkaline phosphatase (APISO): 82 U/L (ref 37–153)
BUN/Creatinine Ratio: 20 (calc) (ref 6–22)
BUN: 20 mg/dL (ref 7–25)
CO2: 27 mmol/L (ref 20–32)
Calcium: 8.8 mg/dL (ref 8.6–10.4)
Chloride: 107 mmol/L (ref 98–110)
Creat: 1.02 mg/dL — ABNORMAL HIGH (ref 0.60–1.00)
Globulin: 1.7 g/dL (calc) — ABNORMAL LOW (ref 1.9–3.7)
Glucose, Bld: 84 mg/dL (ref 65–99)
Potassium: 4.5 mmol/L (ref 3.5–5.3)
Sodium: 139 mmol/L (ref 135–146)
Total Bilirubin: 0.3 mg/dL (ref 0.2–1.2)
Total Protein: 5.6 g/dL — ABNORMAL LOW (ref 6.1–8.1)
eGFR: 56 mL/min/{1.73_m2} — ABNORMAL LOW (ref >=60)

## 2021-07-07 LAB — VITAMIN D 25 HYDROXY (VIT D DEFICIENCY, FRACTURES): Vit D, 25-Hydroxy: 45 ng/mL (ref 30–100)

## 2021-07-07 LAB — MAGNESIUM: Magnesium: 2.2 mg/dL (ref 1.5–2.5)

## 2021-07-07 LAB — URINALYSIS, ROUTINE W REFLEX MICROSCOPIC
Bacteria, UA: NONE SEEN /HPF
Bilirubin Urine: NEGATIVE
Glucose, UA: NEGATIVE
Hgb urine dipstick: NEGATIVE
Hyaline Cast: NONE SEEN /LPF
Ketones, ur: NEGATIVE
Nitrite: NEGATIVE
Protein, ur: NEGATIVE
Specific Gravity, Urine: 1.015 (ref 1.001–1.035)
Squamous Epithelial / HPF: NONE SEEN /HPF (ref <=5)
pH: 5.5 (ref 5.0–8.0)

## 2021-07-07 LAB — TSH: TSH: 3.91 mIU/L (ref 0.40–4.50)

## 2021-07-07 LAB — INSULIN, RANDOM: Insulin: 24.1 u[IU]/mL — ABNORMAL HIGH

## 2021-07-07 LAB — MICROALBUMIN / CREATININE URINE RATIO
Creatinine, Urine: 109 mg/dL (ref 20–275)
Microalb Creat Ratio: 14 mcg/mg creat (ref <30)
Microalb, Ur: 1.5 mg/dL

## 2021-07-07 LAB — LIPID PANEL
Cholesterol: 118 mg/dL (ref <200)
HDL: 50 mg/dL (ref >=50)
LDL Cholesterol (Calc): 53 mg/dL (calc)
Non-HDL Cholesterol (Calc): 68 mg/dL (calc) (ref <130)
Total CHOL/HDL Ratio: 2.4 (calc) (ref <5.0)
Triglycerides: 73 mg/dL (ref <150)

## 2021-07-07 LAB — HEMOGLOBIN A1C
Hgb A1c MFr Bld: 5.6 % of total Hgb (ref <5.7)
Mean Plasma Glucose: 114 mg/dL
eAG (mmol/L): 6.3 mmol/L

## 2021-10-03 ENCOUNTER — Ambulatory Visit: Payer: PPO | Admitting: Cardiology

## 2021-10-03 ENCOUNTER — Encounter: Payer: Self-pay | Admitting: Cardiology

## 2021-10-03 VITALS — BP 124/72 | HR 67 | Temp 97.6°F | Resp 16 | Ht 64.0 in | Wt 180.4 lb

## 2021-10-03 DIAGNOSIS — I10 Essential (primary) hypertension: Secondary | ICD-10-CM

## 2021-10-03 DIAGNOSIS — D500 Iron deficiency anemia secondary to blood loss (chronic): Secondary | ICD-10-CM

## 2021-10-03 DIAGNOSIS — I2781 Cor pulmonale (chronic): Secondary | ICD-10-CM | POA: Diagnosis not present

## 2021-10-03 DIAGNOSIS — R42 Dizziness and giddiness: Secondary | ICD-10-CM

## 2021-10-03 DIAGNOSIS — I480 Paroxysmal atrial fibrillation: Secondary | ICD-10-CM | POA: Diagnosis not present

## 2021-10-03 DIAGNOSIS — R0609 Other forms of dyspnea: Secondary | ICD-10-CM | POA: Diagnosis not present

## 2021-10-03 NOTE — Progress Notes (Signed)
Primary Physician/Referring:  Unk Pinto, MD  Patient ID: Lori [name], female    DOB: [birth year], 81 y.o.   MRN: [id number]  Chief Complaint  Patient presents with   Atrial Fibrillation   Follow-up    1 year   HPI:    Lori [name]  is a 81 y.o. Caucasian female with multiple medical comorbidity, has hypertension, hyperlipidemia, stage IIIa chronic kidney disease, history of cerebral aneurysm SP coiling in 2001, paroxysmal atrial fibrillation first documented in January 2019, history of pulmonary embolism in 2019 and GI bleed related to severe esophagitis and gastritis from NSAID use with no recurrence however has chronic anemia.  She presents for annual visit, states that her dyspnea has become very severe now she has class III-close to class IV dyspnea with marked limitation in physical activity.  No chest pain.  No PND or orthopnea.  She has occasional left leg edema at the end of the day.  No recent weight changes.  Past Medical History:  Diagnosis Date   Anemia    Arthritis    Benign neoplasm of sigmoid colon    Deafness    left ear per pt    Dyspnea    pt reports with exertion due to hip and knees    Dysrhythmia    afib    History of bleeding ulcers    2019 received 3-4 units of blood    Hyperlipidemia    Hypertension    Pericardial effusion 02/04/2018   Pre-diabetes    per Dr Einar Gip note of 09/28/19 pt denies at preop visit on 10/06/19    Prediabetes    Primary localized osteoarthritis of right hip 09/26/2019   Pulmonary embolism ([place])    hx of 2019    Vertigo    Vitamin D deficiency    Family History  Problem Relation Age of Onset   Hypertension Mother    Diabetes Mother    Cancer Mother        Pancreatic   Cancer Father        Skin    Social History   Tobacco Use   Smoking status: Former    Types: Cigarettes    Quit date: 08/07/1990    Years since quitting: 31.1   Smokeless tobacco: Never  Substance Use Topics   Alcohol use: No    Alcohol/week:  0.0 standard drinks of alcohol   Marital Status: Divorced   ROS  Review of Systems  Cardiovascular:  Positive for dyspnea on exertion and leg swelling (left leg occasionally). Negative for chest pain and syncope.  Musculoskeletal:  Positive for back pain and joint pain.  Gastrointestinal:  Negative for melena.   Objective  Blood pressure 124/72, pulse 67, temperature 97.6 F (36.4 C), temperature source Temporal, resp. rate 16, height _0  (1.626 m), weight 180 lb 6.4 oz (81.8 kg), SpO2 96 %.     10/03/2021    1:10 PM 07/04/2021   10:09 AM 01/14/2021    2:11 PM  Vitals with BMI  Height _1  _2    Weight 180 lbs 6 oz 181 lbs 10 oz 184 lbs  BMI 12.24 82.50   Systolic 037 048 889  Diastolic 72 68 60  Pulse 67 68 76    Orthostatic VS for the past 72 hrs (Last 3 readings):  Orthostatic BP Patient Position BP Location Cuff Size Orthostatic Pulse  10/03/21 1340 120/60 Standing Left Arm Normal 67  10/03/21 1339 125/65 Sitting Left Arm Normal 53  10/03/21 1338 126/60 Supine Left Arm Normal --  10/03/21 1310 -- Sitting Left Arm Normal --     Physical Exam Neck:     Vascular: No JVD.  Cardiovascular:     Rate and Rhythm: Normal rate and regular rhythm.     Pulses: Intact distal pulses.     Heart sounds: Murmur heard.     Systolic murmur is present with a grade of 2/6 at the lower left sternal border.     No gallop.  Pulmonary:     Effort: Pulmonary effort is normal.     Breath sounds: Normal breath sounds.  Abdominal:     General: Bowel sounds are normal.     Palpations: Abdomen is soft.  Musculoskeletal:     Right lower leg: No edema.     Left lower leg: No edema (Trace. Varicose veins noted).    Laboratory examination:   Lab Results  Component Value Date   NA 139 07/04/2021   K 4.5 07/04/2021   CO2 27 07/04/2021   GLUCOSE 84 07/04/2021   BUN 20 07/04/2021   CREATININE 1.02 (H) 07/04/2021   CALCIUM 8.8 07/04/2021   EGFR 56 (L) 07/04/2021   GFRNONAA 61  07/02/2020    Recent Labs    01/14/21 1501 07/04/21 1010  NA 145 139  K 4.6 4.5  CL 109 107  CO2 27 27  GLUCOSE 70 84  BUN 21 20  CREATININE 1.03* 1.02*  CALCIUM 9.5 8.8   CrCl cannot be calculated (Patient's most recent lab result is older than the maximum 21 days allowed.).     Latest Ref Rng & Units 07/04/2021   10:10 AM 01/14/2021    3:01 PM 07/02/2020    3:19 PM  CMP  Glucose 65 - 99 mg/dL 84  70  94   BUN 7 - 25 mg/dL _0 Creatinine 0.60 - 1.00 mg/dL 1.02  1.03  0.91   Sodium 135 - 146 mmol/L 139  145  142   Potassium 3.5 - 5.3 mmol/L 4.5  4.6  4.7   Chloride 98 - 110 mmol/L 107  109  107   CO2 20 - 32 mmol/L _1 Calcium 8.6 - 10.4 mg/dL 8.8  9.5  9.6   Total Protein 6.1 - 8.1 g/dL 5.6  6.2  6.4   Total Bilirubin 0.2 - 1.2 mg/dL 0.3  0.3  0.3   AST 10 - 35 U/L _2 ALT 6 - 29 U/L _3 Latest Ref Rng & Units 07/04/2021   10:10 AM 01/14/2021    3:01 PM 07/02/2020    3:19 PM  CBC  WBC 3.8 - 10.8 Thousand/uL 6.4  6.0  6.5   Hemoglobin 11.7 - 15.5 g/dL 11.1  10.2  12.0   Hematocrit 35.0 - 45.0 % 36.3  32.8  38.1   Platelets 140 - 400 Thousand/uL 182  225  224    Lipid Panel Recent Labs    01/14/21 1501 07/04/21 1010  CHOL 167 118  TRIG 200* 73  LDLCALC 92 53  HDL 45* 50  CHOLHDL 3.7 2.4    HEMOGLOBIN A1C Lab Results  Component Value Date   HGBA1C 5.6 07/04/2021   MPG 114 07/04/2021   TSH Recent Labs    07/04/21 1010  TSH 3.91    Medications and allergies  No Known  Allergies    Current Outpatient Medications:    acetaminophen (TYLENOL) 500 MG tablet, Take 1,000 mg by mouth every 6 (six) hours as needed for mild pain or moderate pain., Disp: , Rfl:    apixaban (ELIQUIS) 5 MG TABS tablet, Take  1 tablet  2 x /day  to Prevent Blood Clots / Patient knows to take by mouth, Disp: 180 tablet, Rfl: 3   diazepam (VALIUM) 5 MG tablet, Take  1/2 to 1 tablet  Daily  for Dizziness / Vertigo                                          /                    TAKE                BY                  MOUTH, Disp: 90 tablet, Rfl: 0   ferrous sulfate 325 (65 FE) MG tablet, Take 325 mg by mouth daily with breakfast., Disp: , Rfl:    gabapentin (NEURONTIN) 300 MG capsule, Take  1 capsule  3 x /day for Nerve Pain                                                  /                    TAKE                           BY                     MOUTH, Disp: 270 capsule, Rfl: 3   metoprolol tartrate (LOPRESSOR) 25 MG tablet, Take  1 tablet  Daily  for BP & Heart, Disp: 90 tablet, Rfl: 3   pantoprazole (PROTONIX) 40 MG tablet, Take  1 tablet  Daily  to Prevent Heartburn & Indigestion, Disp: 90 tablet, Rfl: 3   Cholecalciferol 25 MCG (1000 UT) capsule, Take 1,000 Units by mouth daily. , Disp: , Rfl:   Radiology:   CT Chest 02/03/2018: 1. There is a an acute segmental pulmonary embolus within the left lower lobe pulmonary arteries. 2. No large area of pulmonary consolidation. 3. No mediastinal or hilar mass.  Cardiac Studies:   Lower Venous Study 02/04/2018: Indications: Pulmonary embolism. Right: There is no evidence of deep vein thrombosis in the lower extremity. No cystic structure found in the popliteal fossa.  Left: There is no evidence of deep vein thrombosis in the lower extremity. No cystic structure found in the popliteal fossa.   Lexiscan Tetrofosmin stress test 08/30/2019: Lexiscan nuclear stress test performed using 1-day protocol. Stress EKG is non-diagnostic, as this is pharmacological stress test. Normal myocardial perfusion. Stress LVEF 58%. Low risk study.  Echocardiogram 09/24/2020: Normal LV systolic function with EF 55%. Left ventricle cavity is normal in size. Moderate concentric hypertrophy of the left ventricle. Normal global wall motion. Doppler evidence of grade I (impaired) diastolic dysfunction, normal LAP. Calculated EF 55%. Left atrial cavity is moderately dilated. Right atrial cavity is mildly  dilated. Moderate (Grade  II) mitral regurgitation. Moderate tricuspid regurgitation. Mild to moderate pulmonary hypertension. Estimated pulmonary artery systolic pressure 46 mmHg. Insignificant pericardial effusion. Previous study on 09/12/2020 estimated PASP 59 mmHg.  EKG  EKG 10/03/2021: Normal sinus rhythm at rate of 65 bpm, normal EKG.  No change from 10/03/2020.  EKG 04/09/2017: Atrial fibrillation with rapid ventricular response.  Nonspecific ST-T abnormality.  Assessment     ICD-10-CM   1. Paroxysmal atrial fibrillation ([name])  I48.0 EKG 12-Lead    PCV ECHOCARDIOGRAM COMPLETE    2. Dyspnea on exertion  R06.09 PCV ECHOCARDIOGRAM COMPLETE    Ambulatory referral to Pulmonology    Pro b natriuretic peptide (BNP)    3. Dizziness and giddiness  R42     4. Iron deficiency anemia due to chronic blood loss  D50.0     5. Cor pulmonale (chronic) ([name])  I27.81 PCV ECHOCARDIOGRAM COMPLETE    Ambulatory referral to Pulmonology    6. Essential hypertension  I10       CHA2DS2-VASc Score is 4.  Yearly risk of stroke: 5% (F, A, HTN).  Score of 1=0.6; 2=2.2; 3=3.2; 4=4.8; 5=7.2; 6=9.8; 7=>9.8) -(CHF; HTN; vasc disease DM,  Female = 1; Age <65 =0; 65-74 = 1,  >75 =2; stroke/embolism= 2).    Orders Placed This Encounter  Procedures   Pro b natriuretic peptide (BNP)   Ambulatory referral to Pulmonology    Referral Priority:   Routine    Referral Type:   Consultation    Referral Reason:   Specialty Services Required    Requested Specialty:   Pulmonary Disease    Number of Visits Requested:   1   EKG 12-Lead   PCV ECHOCARDIOGRAM COMPLETE    Standing Status:   Future    Standing Expiration Date:   10/04/2022   Recommendations:   Lori [name]  is a 81 y.o. Caucasian female with multiple medical comorbidity, has hypertension, hyperlipidemia, stage IIIa chronic kidney disease, history of cerebral aneurysm SP coiling in 2001, paroxysmal atrial fibrillation first documented in January 2019,  history of pulmonary embolism in 2019 and GI bleed related to severe esophagitis and gastritis from NSAID use with no recurrence however has chronic anemia.  She presents for annual visit, states that her dyspnea has become very severe now she has class III-close to class IV dyspnea with marked limitation in physical activity.  There was no JVD, lungs were clear and no leg edema.  Suspect either underlying COPD or worsening pulmonary hypertension WHO group 3 to be the etiology.  Will obtain proBNP today.  Schedule him for an echocardiogram to evaluate pulmonary pressures and evaluate for diastolic function as well as systolic function of the LV and RV.  We will refer to pulmonary medicine for evaluation of her dyspnea which appears to be out of proportion to her heart condition.  Her recent anemia panel also reveals presence of severe iron deficiency as well.  We may consider iron infusion or iron supplementation to see if her symptoms would improve although she is not severely anemic (Secondary to esophagitis, cameron's lesions per EGD 01/2018, anticipate chronic iron replacement needs).  Blood pressure is well controlled, she complains of dizziness and vertigo, she is negative for orthostasis.  Hence do not suspect cardiac etiology for her dizziness that is chronic and ongoing.  She is maintaining sinus rhythm with regard to atrial fibrillation.  Continue anticoagulation.  CBC has remained stable.  I have reviewed her external records and external labs.  This was  a 40-minute office visit encounter.   Adrian Prows, MD, [facility] 10/03/2021, 2:00 PM Office: [phone]

## 2021-10-14 ENCOUNTER — Ambulatory Visit (INDEPENDENT_AMBULATORY_CARE_PROVIDER_SITE_OTHER): Payer: PPO | Admitting: Nurse Practitioner

## 2021-10-14 VITALS — BP 130/70 | HR 61 | Temp 97.5°F | Ht 64.0 in | Wt 178.0 lb

## 2021-10-14 DIAGNOSIS — R6889 Other general symptoms and signs: Secondary | ICD-10-CM

## 2021-10-14 DIAGNOSIS — Z79899 Other long term (current) drug therapy: Secondary | ICD-10-CM | POA: Diagnosis not present

## 2021-10-14 DIAGNOSIS — Z0001 Encounter for general adult medical examination with abnormal findings: Secondary | ICD-10-CM | POA: Diagnosis not present

## 2021-10-14 DIAGNOSIS — I482 Chronic atrial fibrillation, unspecified: Secondary | ICD-10-CM

## 2021-10-14 DIAGNOSIS — S069XAS Unspecified intracranial injury with loss of consciousness status unknown, sequela: Secondary | ICD-10-CM

## 2021-10-14 DIAGNOSIS — R7309 Other abnormal glucose: Secondary | ICD-10-CM | POA: Diagnosis not present

## 2021-10-14 DIAGNOSIS — D500 Iron deficiency anemia secondary to blood loss (chronic): Secondary | ICD-10-CM

## 2021-10-14 DIAGNOSIS — E66811 Obesity, class 1: Secondary | ICD-10-CM

## 2021-10-14 DIAGNOSIS — D509 Iron deficiency anemia, unspecified: Secondary | ICD-10-CM | POA: Diagnosis not present

## 2021-10-14 DIAGNOSIS — E782 Mixed hyperlipidemia: Secondary | ICD-10-CM | POA: Diagnosis not present

## 2021-10-14 DIAGNOSIS — R42 Dizziness and giddiness: Secondary | ICD-10-CM

## 2021-10-14 DIAGNOSIS — E669 Obesity, unspecified: Secondary | ICD-10-CM | POA: Diagnosis not present

## 2021-10-14 DIAGNOSIS — K219 Gastro-esophageal reflux disease without esophagitis: Secondary | ICD-10-CM

## 2021-10-14 DIAGNOSIS — Z86711 Personal history of pulmonary embolism: Secondary | ICD-10-CM

## 2021-10-14 DIAGNOSIS — I83813 Varicose veins of bilateral lower extremities with pain: Secondary | ICD-10-CM

## 2021-10-14 DIAGNOSIS — Z0000 Encounter for general adult medical examination without abnormal findings: Secondary | ICD-10-CM

## 2021-10-14 DIAGNOSIS — E559 Vitamin D deficiency, unspecified: Secondary | ICD-10-CM

## 2021-10-14 DIAGNOSIS — I10 Essential (primary) hypertension: Secondary | ICD-10-CM

## 2021-10-14 DIAGNOSIS — D692 Other nonthrombocytopenic purpura: Secondary | ICD-10-CM

## 2021-10-14 NOTE — Progress Notes (Signed)
MEDICARE ANNUAL WELLNESS VISIT AND 3 MONTH FOLLOW UP  Assessment:   Encounter for Medicare annual wellness exam Due annually  Essential hypertension Controlled  Continue medications;  Discussed DASH (Dietary Approaches to Stop Hypertension) DASH diet is lower in sodium than a typical American diet. Cut back on foods that are high in saturated fat, cholesterol, and trans fats. Eat more whole-grain foods, fish, poultry, and nuts Remain active and exercise as tolerated daily.  Monitor BP at home-Call if greater than 130/80.    Chronic atrial fibrillation ([name])/ thrombophilia ([name]) Rate controlled, on NOAC for chadsvasc of 4, and metoprolol  Dr. Einar Gip following - saw lat week. Continue to monitor   History of PE Continue Elequis. 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 Recommended diet heavy in fruits and veggies, omega 3's. Decrease consumption of animal meats, cheeses, and dairy products. Remain active and exercise as tolerated. Continue to monitor.   Iron deficiency due to chronic blood loss  Monitor CBC, iron panel; anticipate chronic iron replacement needs secondary to esophagitis and cameron's lesion  Abnormal glucose Education: Reviewed 'ABCs' of diabetes management  A1C (<7) Blood pressure (<130/80) Cholesterol (LDL <70) Continue Eye Exam yearly  Continue Dental Exam Q6 mo Discussed dietary recommendations Discussed Physical Activity recommendations Foot exam UTD Monitor A1C  Vitamin D deficiency Continue supplement Check Vitamin D levels   Overweight  Discussed appropriate BMI Goal of losing 1 lb per month. Diet modification. Physical activity. Encouraged/praised to build confidence.   Senile purpura ([name]) R/t eliquis and age; Discussed process, protect skin, sunscreen  Varicose  veins Discussed weight loss discussed, continue compression stockings and elevation  GERD/cameron lesions Anticipate PPI and iron supplement indefinitely per GI Monitor magnesium, vitamin D Avoid NSAIDs  Medication Management All medications discussed and reviewed in full. All questions and concerns regarding medications addressed.     Over 40 minutes of face to face interview, exam, counseling, chart review and critical decision making was performed Future Appointments  Date Time Provider [place]  10/21/2021 10:30 AM Freddi Starr, MD [facility] None  11/19/2021 11:00 AM [facility]/VAS 1 [facility] None  12/08/2021 11:15 AM Adrian Prows, MD [facility] None  01/14/2022 11:30 AM Unk Pinto, MD [facility] None  07/09/2022 10:00 AM Unk Pinto, MD [facility] None  10/08/2022 10:00 AM Darrol Jump, NP [facility] None     Plan:   During the course of the visit the patient was educated and counseled about appropriate screening and preventive services including:   Pneumococcal vaccine  Prevnar 13 Influenza vaccine Td vaccine Screening electrocardiogram Bone densitometry screening Colorectal cancer screening Diabetes screening Glaucoma screening Nutrition counseling  Advanced directives: requested   Subjective:  Lori [name] is a 81 y.o. female who presents for Medicare Annual Wellness Visit and 3 month follow up  She reports over all she is doing well.  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  She s/p TRH 10/17/19 and reports she is doing very well, she completed PT.  She ambulates with a cane.  Reports she is having some sciatic pains now on left side and working to improve this with Ortho, Dr Percell Miller.  Patient was hospitalized in Nov 2019  with a severe GI bleed (hgb 5 gm%) requiring  pRBC transfusions and found to have esophagitis, erosive gastropathy, Cameron lesion and a small colon polyp &  was initiated on PPI. CBCs and iron  levels have since resolved to normal range, continues on daily iron.   She takes CBD oil PRN anxiety and LE pain, uses valium rarelly, tylenol as well for pain with aleve occasionally and reports she is managing well for vertigo and anxiety.    BMI is Body mass index is 30.55 kg/m., she has not been working on diet and exercise.   Wt Readings from Last 3 Encounters:  10/14/21 178 lb (80.7 kg)  10/03/21 180 lb 6.4 oz (81.8 kg)  07/04/21 181 lb 9.6 oz (82.4 kg)   Her blood pressure has been controlled at home, today their BP is BP: 130/70  She does not workout but is very active with 35 dogs and goats. She denies chest pain, shortness of breath, dizziness. In 2009, she had a false (+) Myoview confirmed by a Negative Heart Cath. Last Echo 09/13/19, EF 69%  She has history of Afib, CHA2DS2VASC 4, declined anticoag/NOAC for many years until recent PE and now doing well on eliquis.  She follows with Cardiology.   She is not on cholesterol medication and denies myalgias. Her LDL cholesterol is at goal. The cholesterol last visit was:   Lab Results  Component Value Date   CHOL 118 07/04/2021   HDL 50 07/04/2021   LDLCALC 53 07/04/2021   TRIG 73 07/04/2021   CHOLHDL 2.4 07/04/2021    She has been working on diet and exercise for prediabetes, and denies polydipsia, polyuria and visual disturbances. Last A1C in the office was:  Lab Results  Component Value Date   HGBA1C 5.6 07/04/2021   Recently had episode of AKI that did improve with cessation of diuretic/NSAID and advised increased water, admits 2-3 bottles of water, 2 bottles of pepsi  Lab Results  Component Value Date   GFRNONAA 61 07/02/2020   GFRNONAA 52 (L) 04/03/2020   GFRNONAA 43 (L) 01/02/2020   Lab Results  Component Value Date   CREATININE 1.02 (H) 07/04/2021   CREATININE 1.03 (H) 01/14/2021   CREATININE 0.91 07/02/2020   Patient is on Vitamin D supplement.   Lab Results  Component Value Date   VD25OH 45 07/04/2021       Blood pressure 130/70, pulse 61, temperature (!) 97.5 F (36.4 C), height '5\' 4"'$  (1.626 m), weight 178 lb (80.7 kg), SpO2 94 %.  Medications   Current Outpatient Medications (Cardiovascular):    metoprolol tartrate (LOPRESSOR) 25 MG tablet, Take  1 tablet  Daily  for BP & Heart   triamterene-hydrochlorothiazide (MAXZIDE-25) 37.5-25 MG tablet, Take 1 tablet by mouth as needed.   Current Outpatient Medications (Analgesics):    acetaminophen (TYLENOL) 500 MG tablet, Take 1,000 mg by mouth every 6 (six) hours as needed for mild pain or moderate pain. (Patient not taking: Reported on 10/14/2021)  Current Outpatient Medications (Hematological):    apixaban (ELIQUIS) 5 MG TABS tablet, Take  1 tablet  2 x /day  to Prevent Blood Clots / Patient knows to take by mouth   ferrous sulfate 325 (65 FE) MG tablet, Take 325 mg by mouth daily with breakfast.  Current Outpatient Medications (Other):    Cholecalciferol 25 MCG (1000 UT) capsule, Take 1,000 Units by mouth daily.    [employer] MAGNESIUM PO, Take by mouth. Brand is Maganese   diazepam (VALIUM) 5 MG tablet, Take  1/2 to 1 tablet  Daily  for Dizziness / Vertigo                                         /  TAKE                BY                  MOUTH   gabapentin (NEURONTIN) 300 MG capsule, Take  1 capsule  3 x /day for Nerve Pain                                                  /                    TAKE                           BY                     MOUTH   pantoprazole (PROTONIX) 40 MG tablet, Take  1 tablet  Daily  to Prevent Heartburn & Indigestion   No Known Allergies  Current Problems (verified) Patient Active Problem List   Diagnosis Date Noted   Thrombophilia ([place]) 04/20/2019   Spinal stenosis of lumbar region 04/20/2019   Iron deficiency anemia due to chronic blood loss 03/08/2019   Varicose vein of leg 09/01/2018   Gastroesophageal reflux disease with esophagitis    History of pulmonary embolism (01/2018) 02/04/2018    Senile purpura ([place]) 07/20/2017   Atrial fibrillation ([place]) 06/16/2017   Vertigo due to brain injury ([place]) 02/06/2015   Obesity (BMI 30.0-34.9) 02/06/2015   Medication management 11/18/2013   Hyperlipidemia, mixed    Vitamin D deficiency    Essential hypertension    Screening Tests Immunization History  Administered Date(s) Administered   DT (Pediatric) 01/24/2014   Pneumococcal Conjugate-13 05/26/2018   Pneumococcal Polysaccharide-23 10/06/2011   Td 04/23/2003   Preventative care: Last colonoscopy: 01/2018 no further follow up secondary to age per Dr. Fuller Plan Last EGD: 01/2018 - esophagitis, cameron lesions, anticipate chronic iron loss Last mammogram: 09/2012 - she states if she did have cancer she would not want treatment Last pap smear/pelvic exam: 2009 DEXA: 2011 normal, declines further  Prior vaccinations: TD  2015  Influenza: declines Pneumococcal: 2013 Prevnar13: 2020 Shingles/Zostavax: declines Covid 19: declines   Hep C: screening done today with patient permission  Names of Other Physician/Practitioners you currently use: 1. [facility] here for primary care 2. [place], eye doctor, last visit June 2020, has scheduled in 04/2020 3. Needs one, dentist, remote, encouraged to schedule   Patient Care Team: Unk Pinto, MD as PCP - General (Internal Medicine) Unk Pinto, MD as PCP - Internal Medicine (Internal Medicine) Clent Jacks, MD as Consulting Physician (Ophthalmology) Ladene Artist, MD as Consulting Physician (Gastroenterology) Newton Pigg, [facility] as Pharmacist (Pharmacist)  SURGICAL HISTORY She  has a past surgical history that includes Tubal ligation; Cerebral aneurysm repair; Cataract extraction w/ intraocular lens  implant, bilateral (2009); Foot surgery (1993); Colonoscopy (N/A, 02/05/2018); Esophagogastroduodenoscopy (N/A, 02/05/2018); biopsy (02/05/2018); polypectomy (02/05/2018); and Total hip  arthroplasty (Right, 10/17/2019). FAMILY HISTORY Her family history includes Cancer in her father and mother; Diabetes in her mother; Hypertension in her mother. SOCIAL HISTORY She  reports that she quit smoking about 31 years ago. Her smoking use included cigarettes. She has never used smokeless tobacco. She reports that she does not drink alcohol and does not  use drugs.   MEDICARE WELLNESS OBJECTIVES: Physical activity: Exercise limited by: neurologic condition(s);cardiac condition(s);orthopedic condition(s);respiratory conditions(s) Cardiac risk factors: Cardiac Risk Factors include: advanced age (>12mn, >>8women);hypertension;obesity (BMI >30kg/m2) Depression/mood screen:      10/14/2021   12:10 PM  Depression screen PHQ 2/9  Decreased Interest 0  Down, Depressed, Hopeless 0  PHQ - 2 Score 0    ADLs:     10/14/2021   12:10 PM 07/04/2021    3:47 PM  In your present state of health, do you have any difficulty performing the following activities:  Hearing? 0 0  Vision? 0 0  Difficulty concentrating or making decisions? 0 0  Walking or climbing stairs? 0 0  Dressing or bathing? 0 0  Doing errands, shopping? 0 0  Preparing Food and eating ? N   Using the Toilet? N   In the past six months, have you accidently leaked urine? N   Do you have problems with loss of bowel control? N   Managing your Medications? N   Managing your Finances? N   Housekeeping or managing your Housekeeping? N      Cognitive Testing  Alert? Yes  Normal Appearance?Yes  Oriented to person? Yes  Place? Yes   Time? Yes  Recall of three objects?  Yes  Can perform simple calculations? Yes  Displays appropriate judgment?Yes  Can read the correct time from a watch face?Yes  EOL planning: Does Patient Have a Medical Advance Directive?: No Would patient like information on creating a medical advance directive?: No - Patient declined  Review of Systems  Constitutional: Negative.  Negative for malaise/fatigue  and weight loss.  HENT: Negative.  Negative for hearing loss and tinnitus.   Eyes: Negative.  Negative for blurred vision and double vision.  Respiratory: Negative.  Negative for cough, sputum production, shortness of breath and wheezing.   Cardiovascular: Negative.  Negative for chest pain, palpitations, orthopnea, claudication, leg swelling and PND.  Gastrointestinal: Negative.  Negative for abdominal pain, blood in stool, constipation, diarrhea, heartburn, melena, nausea and vomiting.  Genitourinary: Negative.   Musculoskeletal:  Positive for joint pain (Left hip). Negative for falls and myalgias.  Skin: Negative.  Negative for rash.  Neurological:  Negative for dizziness, tingling, sensory change, weakness and headaches.  Endo/Heme/Allergies:  Negative for polydipsia.  Psychiatric/Behavioral: Negative.  Negative for depression, memory loss, substance abuse and suicidal ideas. The patient is not nervous/anxious and does not have insomnia.   All other systems reviewed and are negative.    Objective:     Today's Vitals   10/14/21 1101  BP: 130/70  Pulse: 61  Temp: (!) 97.5 F (36.4 C)  SpO2: 94%  Weight: 178 lb (80.7 kg)  Height: '5\' 4"'$  (1.626 m)   Body mass index is 30.55 kg/m.  General appearance: alert, no distress, WD/WN, female HEENT: normocephalic, sclerae anicteric, TMs pearly, nares patent, no discharge or erythema, pharynx normal Oral cavity: MMM, no lesions Neck: supple, no lymphadenopathy, no thyromegaly, no masses Heart: RRR, normal S1, S2, no murmurs Lungs: CTA bilaterally, no wheezes, rhonchi, or rales Abdomen: +bs, soft, non tender, non distended, no masses, no hepatomegaly, no splenomegaly Musculoskeletal: nontender, no swelling, very slow sitting to standing, slow antalgic gait, bil wrists with bony enlargement without effusion Extremities: no edema, no cyanosis, no clubbing, numerous superficial varicose veins of bilateral lower legs Pulses: 2+ symmetric,  upper and lower extremities, normal cap refill Neurological: alert, oriented x 3, CN2-12 intact, strength normal upper extremities and lower  extremities, sensation normal throughout, DTRs 2+ throughout, no cerebellar signs, gait antalgic Psychiatric: normal affect, behavior normal, pleasant   Medicare Attestation I have personally reviewed: The patient's medical and social history Their use of alcohol, tobacco or illicit drugs Their current medications and supplements The patient's functional ability including ADLs,fall risks, home safety risks, cognitive, and hearing and visual impairment Diet and physical activities Evidence for depression or mood disorders  The patient's weight, height, BMI, and visual acuity have been recorded in the chart.  I have made referrals, counseling, and provided education to the patient based on review of the above and I have provided the patient with a written personalized care plan for preventive services.     Darrol Jump, NP   04/03/2020

## 2021-10-15 ENCOUNTER — Other Ambulatory Visit: Payer: Self-pay | Admitting: Nurse Practitioner

## 2021-10-15 DIAGNOSIS — D509 Iron deficiency anemia, unspecified: Secondary | ICD-10-CM

## 2021-10-16 ENCOUNTER — Other Ambulatory Visit: Payer: Self-pay | Admitting: Nurse Practitioner

## 2021-10-16 DIAGNOSIS — D509 Iron deficiency anemia, unspecified: Secondary | ICD-10-CM

## 2021-10-16 DIAGNOSIS — D649 Anemia, unspecified: Secondary | ICD-10-CM

## 2021-10-16 LAB — CBC WITH DIFFERENTIAL/PLATELET
Absolute Monocytes: 466 cells/uL (ref 200–950)
Basophils Absolute: 42 cells/uL (ref 0–200)
Basophils Relative: 1 %
Eosinophils Absolute: 29 cells/uL (ref 15–500)
Eosinophils Relative: 0.7 %
HCT: 28.3 % — ABNORMAL LOW (ref 35.0–45.0)
Hemoglobin: 8.2 g/dL — ABNORMAL LOW (ref 11.7–15.5)
Lymphs Abs: 1092 cells/uL (ref 850–3900)
MCH: 21.3 pg — ABNORMAL LOW (ref 27.0–33.0)
MCHC: 29 g/dL — ABNORMAL LOW (ref 32.0–36.0)
MCV: 73.5 fL — ABNORMAL LOW (ref 80.0–100.0)
Monocytes Relative: 11.1 %
Neutro Abs: 2570 cells/uL (ref 1500–7800)
Neutrophils Relative %: 61.2 %
Platelets: 195 10*3/uL (ref 140–400)
RBC: 3.85 10*6/uL (ref 3.80–5.10)
RDW: 16.5 % — ABNORMAL HIGH (ref 11.0–15.0)
Total Lymphocyte: 26 %
WBC: 4.2 10*3/uL (ref 3.8–10.8)

## 2021-10-16 LAB — COMPLETE METABOLIC PANEL WITH GFR
AG Ratio: 2.2 (calc) (ref 1.0–2.5)
ALT: 9 U/L (ref 6–29)
AST: 14 U/L (ref 10–35)
Albumin: 4.2 g/dL (ref 3.6–5.1)
Alkaline phosphatase (APISO): 95 U/L (ref 37–153)
BUN: 23 mg/dL (ref 7–25)
CO2: 26 mmol/L (ref 20–32)
Calcium: 8.9 mg/dL (ref 8.6–10.4)
Chloride: 110 mmol/L (ref 98–110)
Creat: 0.99 mg/dL (ref 0.60–1.00)
Globulin: 1.9 g/dL (calc) (ref 1.9–3.7)
Glucose, Bld: 89 mg/dL (ref 65–99)
Potassium: 4.6 mmol/L (ref 3.5–5.3)
Sodium: 143 mmol/L (ref 135–146)
Total Bilirubin: 0.5 mg/dL (ref 0.2–1.2)
Total Protein: 6.1 g/dL (ref 6.1–8.1)
eGFR: 58 mL/min/{1.73_m2} — ABNORMAL LOW (ref >=60)

## 2021-10-16 LAB — IRON,TIBC AND FERRITIN PANEL
%SAT: 4 % (calc) — ABNORMAL LOW (ref 16–45)
Ferritin: 3 ng/mL — ABNORMAL LOW (ref 16–288)
Iron: 17 ug/dL — ABNORMAL LOW (ref 45–160)
TIBC: 457 mcg/dL (calc) — ABNORMAL HIGH (ref 250–450)

## 2021-10-16 LAB — TEST AUTHORIZATION

## 2021-10-16 NOTE — Progress Notes (Signed)
Patient is aware of lab results and that we will call her back once her anemia panel comes back. She said she hasn't had a transfusion since she was in the hospital about 2 years ago.

## 2021-10-21 ENCOUNTER — Ambulatory Visit: Payer: PPO | Admitting: Pulmonary Disease

## 2021-10-21 ENCOUNTER — Encounter: Payer: Self-pay | Admitting: Pulmonary Disease

## 2021-10-21 ENCOUNTER — Telehealth: Payer: Self-pay | Admitting: Hematology and Oncology

## 2021-10-21 VITALS — BP 116/70 | HR 69 | Ht 64.0 in | Wt 179.5 lb

## 2021-10-21 DIAGNOSIS — R0602 Shortness of breath: Secondary | ICD-10-CM

## 2021-10-21 MED ORDER — ANORO ELLIPTA 62.5-25 MCG/ACT IN AEPB: 1.0000 | INHALATION_SPRAY | Freq: Every day | RESPIRATORY_TRACT | 0 refills | Status: DC

## 2021-10-21 NOTE — Patient Instructions (Signed)
Try anoro ellipta 1 puff daily over the next month  Follow up in 1 month for pulmonary function tests

## 2021-10-21 NOTE — Telephone Encounter (Signed)
Scheduled appt per 8/3 referral. Pt is aware of appt date and time. Pt is aware to arrive 15 mins prior to appt time and to bring and updated insurance card. Pt is aware of appt location.   

## 2021-10-21 NOTE — Progress Notes (Signed)
Synopsis: Referred in August 2023 for Cor pulmonale by Adrian Prows, MD  Subjective:   PATIENT ID: Lori [name] GENDER: female DOB: 1940-10-10, MRN: [id number]   HPI  Chief Complaint  Patient presents with   Consult    Referred by cardiologist for DOE for the past few years. Denies any wheezing or coughing.    Lori [name] is a 81 year old woman, former smoker with hypertension, atrial fibrillation, CKDIII and pulmonary embolism in 2019 who is referred to pulmonary clinic for shortness of breath.   She experiences dyspnea walking from room to room at home. She reports her dyspnea has been the same for many years. She reports lower extremity edema, greatest in left leg due to history of infections/trauma to this leg. She denies orthopnea or PND. She denies cough or wheezing with the exertional dyspnea. She has issues with vertigo which affect her when she is active.   Echo last year showed PASP of 54mHg. Grade I diastolic dysfunction. Moderately dilated left atrium. Right atrium mildly dilated. Moderate mitral regurgitation and moderate tricuspid regurgitation.  She worked for [employer]. No significant dust or chemical exposures. She quit smoking 35 years ago. She smoked a pack per day for 20-30 years. Significant second hand smoke exposure in childhood. No family history of lung disease.  Past Medical History:  Diagnosis Date   Anemia    Arthritis    Benign neoplasm of sigmoid colon    Deafness    left ear per pt    Dyspnea    pt reports with exertion due to hip and knees    Dysrhythmia    afib    History of bleeding ulcers    2019 received 3-4 units of blood    Hyperlipidemia    Hypertension    Pericardial effusion 02/04/2018   Pre-diabetes    per Dr GEinar Gipnote of 09/28/19 pt denies at preop visit on 10/06/19    Prediabetes    Primary localized osteoarthritis of right hip 09/26/2019   Pulmonary embolism ([place]    hx of 2019    Vertigo    Vitamin D  deficiency      Family History  Problem Relation Age of Onset   Hypertension Mother    Diabetes Mother    Cancer Mother        Pancreatic   Cancer Father        Skin     Social History   Socioeconomic History   Marital status: Divorced    Spouse name: Not on file   Number of children: 1   Years of education: 12   Highest education level: Not on file  Occupational History   Not on file  Tobacco Use   Smoking status: Former    Types: Cigarettes    Quit date: 08/07/1990    Years since quitting: 31.2   Smokeless tobacco: Never  Vaping Use   Vaping Use: Never used  Substance and Sexual Activity   Alcohol use: No    Alcohol/week: 0.0 standard drinks of alcohol   Drug use: No   Sexual activity: Not on file  Other Topics Concern   Not on file  Social History Narrative   Not on file   Social Determinants of Health   Financial Resource Strain: Not on file  Food Insecurity: Not on file  Transportation Needs: Not on file  Physical Activity: Not on file  Stress: Not on file  Social Connections: Not  on file  Intimate Partner Violence: Not on file     No Known Allergies   Outpatient Medications Prior to Visit  Medication Sig Dispense Refill   acetaminophen (TYLENOL) 500 MG tablet Take 1,000 mg by mouth every 6 (six) hours as needed for mild pain or moderate pain.     apixaban (ELIQUIS) 5 MG TABS tablet Take  1 tablet  2 x /day  to Prevent Blood Clots / Patient knows to take by mouth 180 tablet 3   Cholecalciferol 25 MCG (1000 UT) capsule Take 1,000 Units by mouth daily.      [employer] MAGNESIUM PO Take by mouth. Brand is Maganese     diazepam (VALIUM) 5 MG tablet Take  1/2 to 1 tablet  Daily  for Dizziness / Vertigo                                         /                    TAKE                BY                  MOUTH 90 tablet 0   ferrous sulfate 325 (65 FE) MG tablet Take 325 mg by mouth daily with breakfast.     gabapentin (NEURONTIN) 300 MG capsule Take  1 capsule  3 x  /day for Nerve Pain                                                  /                    TAKE                           BY                     MOUTH 270 capsule 3   metoprolol tartrate (LOPRESSOR) 25 MG tablet Take  1 tablet  Daily  for BP & Heart 90 tablet 3   pantoprazole (PROTONIX) 40 MG tablet Take  1 tablet  Daily  to Prevent Heartburn & Indigestion 90 tablet 3   triamterene-hydrochlorothiazide (MAXZIDE-25) 37.5-25 MG tablet Take 1 tablet by mouth as needed.     No facility-administered medications prior to visit.    Review of Systems  Constitutional:  Negative for chills, fever, malaise/fatigue and weight loss.  HENT:  Negative for congestion, sinus pain and sore throat.   Eyes: Negative.   Respiratory:  Positive for shortness of breath. Negative for cough, hemoptysis, sputum production and wheezing.   Cardiovascular:  Positive for leg swelling. Negative for chest pain, palpitations, orthopnea and claudication.  Gastrointestinal:  Negative for abdominal pain, heartburn, nausea and vomiting.  Genitourinary: Negative.   Musculoskeletal:  Negative for joint pain and myalgias.  Skin:  Negative for rash.  Neurological:  Negative for weakness.  Endo/Heme/Allergies: Negative.   Psychiatric/Behavioral: Negative.        Objective:   Vitals:   10/21/21 1024  BP: 116/70  Pulse: 69  SpO2: 96%  Weight: 179 lb 8 oz (81.4 kg)  Height: '5\' 4"'$  (1.626 m)     Physical Exam Constitutional:      General: She is not in acute distress.    Appearance: She is not ill-appearing.  HENT:     Head: Normocephalic and atraumatic.  Eyes:     General: No scleral icterus.    Conjunctiva/sclera: Conjunctivae normal.     Pupils: Pupils are equal, round, and reactive to light.  Cardiovascular:     Rate and Rhythm: Normal rate and regular rhythm.     Pulses: Normal pulses.     Heart sounds: Normal heart sounds. No murmur heard. Pulmonary:     Effort: Pulmonary effort is normal.     Breath  sounds: Normal breath sounds. No wheezing, rhonchi or rales.  Abdominal:     General: Bowel sounds are normal.     Palpations: Abdomen is soft.  Musculoskeletal:     Right lower leg: No edema.     Left lower leg: Edema (trace) present.  Lymphadenopathy:     Cervical: No cervical adenopathy.  Skin:    General: Skin is warm and dry.  Neurological:     General: No focal deficit present.     Mental Status: She is alert.  Psychiatric:        Mood and Affect: Mood normal.        Behavior: Behavior normal.        Thought Content: Thought content normal.        Judgment: Judgment normal.    CBC    Component Value Date/Time   WBC 4.2 10/14/2021 1205   RBC 3.85 10/14/2021 1205   HGB 8.2 (L) 10/14/2021 1205   HCT 28.3 (L) 10/14/2021 1205   PLT 195 10/14/2021 1205   MCV 73.5 (L) 10/14/2021 1205   MCH 21.3 (L) 10/14/2021 1205   MCHC 29.0 (L) 10/14/2021 1205   RDW 16.5 (H) 10/14/2021 1205   LYMPHSABS 1,092 10/14/2021 1205   MONOABS 0.8 02/04/2018 0130   EOSABS 29 10/14/2021 1205   BASOSABS 42 10/14/2021 1205      Latest Ref Rng & Units 10/14/2021   12:05 PM 07/04/2021   10:10 AM 01/14/2021    3:01 PM  BMP  Glucose 65 - 99 mg/dL 89  84  70   BUN 7 - 25 mg/dL '23  20  21   '$ Creatinine 0.60 - 1.00 mg/dL 0.99  1.02  1.03   BUN/Creat Ratio 6 - 22 (calc) SEE NOTE:  20  20   Sodium 135 - 146 mmol/L 143  139  145   Potassium 3.5 - 5.3 mmol/L 4.6  4.5  4.6   Chloride 98 - 110 mmol/L 110  107  109   CO2 20 - 32 mmol/L '26  27  27   '$ Calcium 8.6 - 10.4 mg/dL 8.9  8.8  9.5    Chest imaging: CT Chest 02/03/18 Cardiovascular: Heart is mildly enlarged. Small pericardial effusion. Aorta and main pulmonary artery normal in caliber. Within the left lower lobe segmental pulmonary artery there is a filling defect (image 79; series 2) most compatible with pulmonary embolus.   Mediastinum/Nodes: Moderate-sized hiatal hernia. No axillary, mediastinal or hilar lymphadenopathy.   Lungs/Pleura:  Central airways are patent. Dependent atelectasis within the bilateral lower lobes. No large area pulmonary consolidation. No pleural effusion or pneumothorax.  PFT:     No data to display          Labs:  Path:  Echo 09/24/20: Normal LV systolic function with  EF 55%. Left ventricle cavity is normal  in size. Moderate concentric hypertrophy of the left ventricle. Normal  global wall motion. Doppler evidence of grade I (impaired) diastolic  dysfunction, normal LAP. Calculated EF 55%.  Left atrial cavity is moderately dilated.  Right atrial cavity is mildly dilated.  Moderate (Grade II) mitral regurgitation.  Moderate tricuspid regurgitation. Mild to moderate pulmonary hypertension.  Estimated pulmonary artery systolic pressure 46 mmHg.  Insignificant pericardial effusion.  Previous study on 09/12/2020 estimated PASP 59 mmHg.  Heart Catheterization:  Assessment & Plan:   Shortness of breath - Plan: Pulmonary Function Test  Discussion: Lori [name] is a 82 year old woman, former smoker with hypertension, atrial fibrillation, CKDIII and pulmonary embolism in 2019 who is referred to pulmonary clinic for shortness of breath.   Her dyspnea is multifactorial in setting of atrial fibrillation, deconditioning, pulmonary hypertension and possible obstructive lung disease.   She has elevated PASP of 69mHg on echo last year and is scheduled for follow up echo next month. Etiology of pulmonary hypertension is due to valvular heart disease, possible underlying lung disease and possible CTEPH given history of PE.  We will trial her on anoro ellipta 1 puff daily over the next month.  Follow up in 1 month for pulmonary function tests. Based on pulmonary function tests we will consider overnight oximetry testing and V/Q scan.   JFreda Jackson MD [facility] Office: [phone]  Current Outpatient Medications:    acetaminophen (TYLENOL) 500 MG tablet, Take 1,000  mg by mouth every 6 (six) hours as needed for mild pain or moderate pain., Disp: , Rfl:    apixaban (ELIQUIS) 5 MG TABS tablet, Take  1 tablet  2 x /day  to Prevent Blood Clots / Patient knows to take by mouth, Disp: 180 tablet, Rfl: 3   Cholecalciferol 25 MCG (1000 UT) capsule, Take 1,000 Units by mouth daily. , Disp: , Rfl:    [employer] MAGNESIUM PO, Take by mouth. Brand is Maganese, Disp: , Rfl:    diazepam (VALIUM) 5 MG tablet, Take  1/2 to 1 tablet  Daily  for Dizziness / Vertigo                                         /                    TAKE                BY                  MOUTH, Disp: 90 tablet, Rfl: 0   ferrous sulfate 325 (65 FE) MG tablet, Take 325 mg by mouth daily with breakfast., Disp: , Rfl:    gabapentin (NEURONTIN) 300 MG capsule, Take  1 capsule  3 x /day for Nerve Pain                                                  /                    TAKE  BY                     MOUTH, Disp: 270 capsule, Rfl: 3   metoprolol tartrate (LOPRESSOR) 25 MG tablet, Take  1 tablet  Daily  for BP & Heart, Disp: 90 tablet, Rfl: 3   pantoprazole (PROTONIX) 40 MG tablet, Take  1 tablet  Daily  to Prevent Heartburn & Indigestion, Disp: 90 tablet, Rfl: 3   triamterene-hydrochlorothiazide (MAXZIDE-25) 37.5-25 MG tablet, Take 1 tablet by mouth as needed., Disp: , Rfl:

## 2021-10-29 ENCOUNTER — Other Ambulatory Visit (INDEPENDENT_AMBULATORY_CARE_PROVIDER_SITE_OTHER): Payer: Self-pay | Admitting: Interventional Cardiology

## 2021-10-29 DIAGNOSIS — I2510 Atherosclerotic heart disease of native coronary artery without angina pectoris: Secondary | ICD-10-CM

## 2021-10-29 DIAGNOSIS — R072 Precordial pain: Secondary | ICD-10-CM

## 2021-10-30 ENCOUNTER — Other Ambulatory Visit: Payer: Self-pay

## 2021-10-30 ENCOUNTER — Inpatient Hospital Stay: Payer: PPO | Attending: Hematology and Oncology | Admitting: Hematology and Oncology

## 2021-10-30 DIAGNOSIS — D500 Iron deficiency anemia secondary to blood loss (chronic): Secondary | ICD-10-CM

## 2021-10-30 DIAGNOSIS — Z87891 Personal history of nicotine dependence: Secondary | ICD-10-CM

## 2021-10-30 DIAGNOSIS — Z86711 Personal history of pulmonary embolism: Secondary | ICD-10-CM

## 2021-10-30 DIAGNOSIS — Z79899 Other long term (current) drug therapy: Secondary | ICD-10-CM

## 2021-10-30 NOTE — Progress Notes (Signed)
[place] NOTE  Patient Care Team: Unk Pinto, MD as PCP - General (Internal Medicine) Unk Pinto, MD as PCP - Internal Medicine (Internal Medicine) Clent Jacks, MD as Consulting Physician (Ophthalmology) Ladene Artist, MD as Consulting Physician (Gastroenterology) Newton Pigg, [facility] as Pharmacist (Pharmacist)  CHIEF COMPLAINTS/PURPOSE OF CONSULTATION:  Iron deficiency anemia  HISTORY OF PRESENTING ILLNESS:  Lori [name] 81 y.o. female is here because of recent diagnosis of iron deficiency anemia.  She tells me that she has a history of pulmonary embolism few years ago and at that time she was found to be anemic and was given 2 units of blood transfusion.  Recently she had a routine physical and was found to be anemic and work-up revealed iron deficiency.  She was referred to [place] for evaluation for IV iron therapy.  She has not noticed any blood in the stool but she lives alone and does not eat a good diet.  Most of the time she does not eat much other than junk food. Over the past several months she is experiencing severe fatigue.  I reviewed her records extensively and collaborated the history with the patient.  MEDICAL HISTORY:  Past Medical History:  Diagnosis Date   Anemia    Arthritis    Benign neoplasm of sigmoid colon    Deafness    left ear per pt    Dyspnea    pt reports with exertion due to hip and knees    Dysrhythmia    afib    History of bleeding ulcers    2019 received 3-4 units of blood    Hyperlipidemia    Hypertension    Pericardial effusion 02/04/2018   Pre-diabetes    per Dr Einar Gip note of 09/28/19 pt denies at preop visit on 10/06/19    Prediabetes    Primary localized osteoarthritis of right hip 09/26/2019   Pulmonary embolism ([place])    hx of 2019    Vertigo    Vitamin D deficiency     SURGICAL HISTORY: Past Surgical History:  Procedure Laterality Date   BIOPSY  02/05/2018   Procedure: BIOPSY;  Surgeon:  Ladene Artist, MD;  Location: [facility];  Service: Endoscopy;;   CATARACT EXTRACTION W/ INTRAOCULAR LENS  IMPLANT, BILATERAL  2009   CEREBRAL ANEURYSM REPAIR     COLONOSCOPY N/A 02/05/2018   Procedure: COLONOSCOPY;  Surgeon: Ladene Artist, MD;  Location: [facility];  Service: Endoscopy;  Laterality: N/A;   ESOPHAGOGASTRODUODENOSCOPY N/A 02/05/2018   Procedure: ESOPHAGOGASTRODUODENOSCOPY (EGD);  Surgeon: Ladene Artist, MD;  Location: [facility];  Service: Endoscopy;  Laterality: N/A;   FOOT SURGERY  1993   POLYPECTOMY  02/05/2018   Procedure: POLYPECTOMY;  Surgeon: Ladene Artist, MD;  Location: [facility];  Service: Endoscopy;;   TOTAL HIP ARTHROPLASTY Right 10/17/2019   Procedure: TOTAL HIP ARTHROPLASTY ANTERIOR APPROACH;  Surgeon: Renette Butters, MD;  Location: [facility];  Service: Orthopedics;  Laterality: Right;   TUBAL LIGATION      SOCIAL HISTORY: Social History   Socioeconomic History   Marital status: Divorced    Spouse name: Not on file   Number of children: 1   Years of education: 12   Highest education level: Not on file  Occupational History   Not on file  Tobacco Use   Smoking status: Former    Types: Cigarettes    Quit date: 08/07/1990    Years since quitting: 31.2   Smokeless tobacco: Never  Vaping Use   Vaping Use: Never used  Substance and Sexual Activity   Alcohol use: No    Alcohol/week: 0.0 standard drinks of alcohol   Drug use: No   Sexual activity: Not on file  Other Topics Concern   Not on file  Social History Narrative   Not on file   Social Determinants of Health   Financial Resource Strain: Not on file  Food Insecurity: Not on file  Transportation Needs: Not on file  Physical Activity: Not on file  Stress: Not on file  Social Connections: Not on file  Intimate Partner Violence: Not on file    FAMILY HISTORY: Family History  Problem Relation Age of Onset   Hypertension Mother    Diabetes Mother    Cancer Mother         Pancreatic   Cancer Father        Skin    ALLERGIES:  has No Known Allergies.  MEDICATIONS:  Current Outpatient Medications  Medication Sig Dispense Refill   acetaminophen (TYLENOL) 500 MG tablet Take 1,000 mg by mouth every 6 (six) hours as needed for mild pain or moderate pain.     apixaban (ELIQUIS) 5 MG TABS tablet Take  1 tablet  2 x /day  to Prevent Blood Clots / Patient knows to take by mouth 180 tablet 3   Cholecalciferol 25 MCG (1000 UT) capsule Take 1,000 Units by mouth daily.      [employer] MAGNESIUM PO Take by mouth. Brand is Maganese     diazepam (VALIUM) 5 MG tablet Take  1/2 to 1 tablet  Daily  for Dizziness / Vertigo                                         /                    TAKE                BY                  MOUTH 90 tablet 0   ferrous sulfate 325 (65 FE) MG tablet Take 325 mg by mouth daily with breakfast.     gabapentin (NEURONTIN) 300 MG capsule Take  1 capsule  3 x /day for Nerve Pain                                                  /                    TAKE                           BY                     MOUTH 270 capsule 3   metoprolol tartrate (LOPRESSOR) 25 MG tablet Take  1 tablet  Daily  for BP & Heart 90 tablet 3   pantoprazole (PROTONIX) 40 MG tablet Take  1 tablet  Daily  to Prevent Heartburn & Indigestion 90 tablet 3   triamterene-hydrochlorothiazide (MAXZIDE-25) 37.5-25 MG tablet Take 1 tablet by mouth as needed.  umeclidinium-vilanterol (ANORO ELLIPTA) 62.5-25 MCG/ACT AEPB Inhale 1 puff into the lungs daily. 2 each 0   No current facility-administered medications for this visit.    REVIEW OF SYSTEMS:   Constitutional: Complains of fatigue and shortness of breath exertion   All other systems were reviewed with the patient and are negative.  PHYSICAL EXAMINATION: ECOG PERFORMANCE STATUS: 1 - Symptomatic but completely ambulatory  Vitals:   10/30/21 1301  BP: (!) 159/73  Pulse: 75  Resp: 18  Temp: (!) 97.2 F (36.2 C)  SpO2: 99%   Filed  Weights   10/30/21 1301  Weight: 178 lb 9.6 oz (81 kg)    GENERAL:alert, no distress and comfortable    LABORATORY DATA:  I have reviewed the data as listed Lab Results  Component Value Date   WBC 4.2 10/14/2021   HGB 8.2 (L) 10/14/2021   HCT 28.3 (L) 10/14/2021   MCV 73.5 (L) 10/14/2021   PLT 195 10/14/2021   Lab Results  Component Value Date   NA 143 10/14/2021   K 4.6 10/14/2021   CL 110 10/14/2021   CO2 26 10/14/2021    RADIOGRAPHIC STUDIES: I have personally reviewed the radiological reports and agreed with the findings in the report.  ASSESSMENT AND PLAN:  Iron deficiency anemia due to chronic blood loss Lab review: 10/14/2021: Hemoglobin 8.2, MCV 73.5, RDW 16.5, CMP is normal, TIBC 457, iron saturation 4%, ferritin 3  I counseled extensively regarding the different causes of iron deficiency including blood loss, decreased oral intake and malabsorption. It is possible that the patient may have an occult source of bleeding.   Recommendation: 1. proceed with IV iron infusions 2.  Consult Dr. Collene Mares with gastroenterology for upper endoscopy and colonoscopy.  I discussed with the patient that potentially there may be a need for additional IV iron infusions if the iron levels were to remain low in the future. The frequency of need of IV iron would depend on many other factors including the rate of loss and by the degree of absorption.  Return to clinic in 3 months with recheck on iron studies and hemoglobin.    All questions were answered. The patient knows to call the clinic with any problems, questions or concerns.    Harriette Ohara, MD 10/30/21

## 2021-10-30 NOTE — Assessment & Plan Note (Signed)
Lab review: 10/14/2021: Hemoglobin 8.2, MCV 73.5, RDW 16.5, CMP is normal, TIBC 457, iron saturation 4%, ferritin 3  Iron deficiency anemia: I discussed with the patient the process of iron absorption. I counseled extensively regarding the different causes of iron deficiency including blood loss and malabsorption. It is possible that the patient may still have an occult source of bleeding. However malabsorption is also a possibility.   Recommendation: 1. Stop oral iron 2. proceed with IV iron infusions  I discussed with the patient that potentially there may be a need for additional IV iron infusions if the iron levels were to remain low in the future. The frequency of need of IV iron would depend on many other factors including the rate of loss and by the degree of absorption.  Return to clinic in 3 months with recheck on iron studies and hemoglobin.

## 2021-11-03 ENCOUNTER — Other Ambulatory Visit: Payer: Self-pay

## 2021-11-03 ENCOUNTER — Other Ambulatory Visit: Payer: Self-pay | Admitting: Nurse Practitioner

## 2021-11-03 DIAGNOSIS — D509 Iron deficiency anemia, unspecified: Secondary | ICD-10-CM

## 2021-11-03 DIAGNOSIS — D649 Anemia, unspecified: Secondary | ICD-10-CM

## 2021-11-03 DIAGNOSIS — D500 Iron deficiency anemia secondary to blood loss (chronic): Secondary | ICD-10-CM

## 2021-11-03 NOTE — Progress Notes (Signed)
Attempted to call pt to discuss need for colonoscopy/endoscopy d/t anemia. Dr Lorie Apley office is not taking new medicare pts as this time. New referral entered for [facility]. Attempted to call pt to make her aware. No answer; LVM for call back.

## 2021-11-04 ENCOUNTER — Other Ambulatory Visit: Payer: Self-pay | Admitting: Nurse Practitioner

## 2021-11-04 ENCOUNTER — Telehealth: Payer: Self-pay

## 2021-11-04 ENCOUNTER — Telehealth: Payer: Self-pay | Admitting: Gastroenterology

## 2021-11-04 DIAGNOSIS — D509 Iron deficiency anemia, unspecified: Secondary | ICD-10-CM

## 2021-11-04 DIAGNOSIS — D649 Anemia, unspecified: Secondary | ICD-10-CM

## 2021-11-04 NOTE — Telephone Encounter (Signed)
Katrina from [facility] called, states patient loosing blood due to low hemoglobin. Requesting a call back on [phone]

## 2021-11-04 NOTE — Telephone Encounter (Signed)
PCP office has referred patient to discuss potential endo colon d/t patient's low hgb. Last Hgb is documented as 8.2, and currently receiving IV iron therapy. Patient was last seen here for endo colon in 01/2018 with Dr. Fuller Plan. Appointment has been scheduled for 11/06/21 at 3:00 pm with Amy, PA. Katrina with PCP office has been notified & she stated she would make the patient aware.

## 2021-11-04 NOTE — Telephone Encounter (Signed)
S/w pt regarding referral to [facility] for colonoscopy/endoscopy. Pt was provided with number to contact them for an appt. She asks if she needs to have the scopes before her IV iron appt. Advised pt she will not need scopes before iv iron. Pt is already scheduled for IV venofer 8/25. Message sent to PA team to determine if she will need PA or not

## 2021-11-06 ENCOUNTER — Ambulatory Visit (INDEPENDENT_AMBULATORY_CARE_PROVIDER_SITE_OTHER): Payer: PPO | Admitting: Physician Assistant

## 2021-11-06 ENCOUNTER — Encounter: Payer: Self-pay | Admitting: Physician Assistant

## 2021-11-06 ENCOUNTER — Other Ambulatory Visit (INDEPENDENT_AMBULATORY_CARE_PROVIDER_SITE_OTHER): Payer: PPO

## 2021-11-06 VITALS — BP 118/78 | HR 57 | Ht 65.0 in | Wt 176.6 lb

## 2021-11-06 DIAGNOSIS — D509 Iron deficiency anemia, unspecified: Secondary | ICD-10-CM | POA: Diagnosis not present

## 2021-11-06 LAB — CBC WITH DIFFERENTIAL/PLATELET
Basophils Absolute: 0.1 10*3/uL (ref 0.0–0.1)
Basophils Relative: 1.1 % (ref 0.0–3.0)
Eosinophils Absolute: 0 10*3/uL (ref 0.0–0.7)
Eosinophils Relative: 0.6 % (ref 0.0–5.0)
HCT: 37.8 % (ref 36.0–46.0)
Hemoglobin: 11.7 g/dL — ABNORMAL LOW (ref 12.0–15.0)
Lymphocytes Relative: 20.1 % (ref 12.0–46.0)
Lymphs Abs: 1.2 10*3/uL (ref 0.7–4.0)
MCHC: 30.8 g/dL (ref 30.0–36.0)
MCV: 79.8 fl (ref 78.0–100.0)
Monocytes Absolute: 0.6 10*3/uL (ref 0.1–1.0)
Monocytes Relative: 9.5 % (ref 3.0–12.0)
Neutro Abs: 4.2 10*3/uL (ref 1.4–7.7)
Neutrophils Relative %: 68.7 % (ref 43.0–77.0)
Platelets: 192 10*3/uL (ref 150.0–400.0)
RBC: 4.74 Mil/uL (ref 3.87–5.11)
RDW: 30 % — ABNORMAL HIGH (ref 11.5–15.5)
WBC: 6.1 10*3/uL (ref 4.0–10.5)

## 2021-11-06 NOTE — Progress Notes (Signed)
Subjective:    Patient ID: Lori [name], female    DOB: 12-07-1940, 81 y.o.   MRN: [id number]  HPI  Lori [name] is a pleasant 81 year old white female, established with Lori [name] who is referred back today by Lori [name]/cardiology with iron deficiency anemia.  Patient has history of hypertension, atrial fibrillation for which she is on Eliquis, she had a PE in 2019, has history of chronic GERD ,thrombophilia, chronic kidney disease stage III A, She had undergone work-up here in 2019 for iron deficiency anemia.  Colonoscopy at that time with finding of a 6 mm sigmoid polyp, multiple diverticuli and internal hemorrhoids.  The polyp was hyperplastic and no repeat was recommended due to age.   EGD at that time showed grade C esophagitis, a benign distal stenosis measuring 1.4 cm, medium size hiatal hernia and multiple nonbleeding erosions in the gastric fundus consistent with Lori [name] erosions.  She was treated with twice daily PPI initially and has been on Protonix once daily since.  She was seen on 10/03/2021 by Lori [name] and at that time was complaining of significant increase in shortness of breath which she felt was limiting her activities, no chest pain, no orthopnea, no recent weight changes. She is scheduled for an upcoming echocardiogram, and has been referred to pulmonary for pulmonary function test etc. So noted to be significantly anemic, and iron deficient and she has since been scheduled for iron infusions.  The first iron infusion will be tomorrow and the second on 11/14/2021. Review of her labs shows 10/14/2021 hemoglobin 8.2/hematocrit 28.3/MCV 73 Ferritin 5 ,iron saturation 21 June 2021 hemoglobin 11.1/hematocrit 36.16 January 2021 hemoglobin 10.2/hematocrit 32.8/MCV 87 at that time ferritin 32/TIBC 424/iron sat 8 and ferritin 18 September 2019 ferritin 74 June 2021 hemoglobin 13.6/hematocrit 42.9  Patient says she feels a little bit better over the past few weeks, she has been eating a lot of greens  as suggested by Lori [name] and says she is not as short of breath as she had been.  She has no complaints of chest pain.  No abdominal pain, no changes in bowel habits, has not noted any melena or hematochezia.  She has been taking her iron supplement once daily and as above continues on Protonix once daily.   Review of Systems Pertinent positive and negative review of systems were noted in the above HPI section.  All other review of systems was otherwise negative.   Outpatient Encounter Medications as of 11/06/2021  Medication Sig   acetaminophen (Lori [name]) 500 MG tablet Take 1,000 mg by mouth every 6 (six) hours as needed for mild pain or moderate pain.   apixaban (ELIQUIS) 5 MG TABS tablet Take  1 tablet  2 x /day  to Prevent Blood Clots / Patient knows to take by mouth   Cholecalciferol 25 MCG (1000 UT) capsule Take 1,000 Units by mouth daily.    Lori [name] Take by mouth. Brand is Maganese   diazepam (VALIUM) 5 MG tablet Take  1/2 to 1 tablet  Daily  for Dizziness / Vertigo                                         /                    TAKE  BY                  MOUTH   ferrous sulfate 325 (65 FE) MG tablet Take 325 mg by mouth daily with breakfast.   gabapentin (NEURONTIN) 300 MG capsule Take  1 capsule  3 x /day for Nerve Pain                                                  /                    TAKE                           BY                     MOUTH   metoprolol tartrate (LOPRESSOR) 25 MG tablet Take  1 tablet  Daily  for BP & Heart   pantoprazole (PROTONIX) 40 MG tablet Take  1 tablet  Daily  to Prevent Heartburn & Indigestion   triamterene-hydrochlorothiazide (MAXZIDE-25) 37.5-25 MG tablet Take 1 tablet by mouth as needed.   umeclidinium-vilanterol (ANORO ELLIPTA) 62.5-25 MCG/ACT AEPB Inhale 1 puff into the lungs daily.   No facility-administered encounter medications on file as of 11/06/2021.   No Known Allergies Patient Active Problem List   Diagnosis Date Noted    Thrombophilia (Lori [name]) 04/20/2019   Spinal stenosis of lumbar region 04/20/2019   Iron deficiency anemia due to chronic blood loss 03/08/2019   Varicose vein of leg 09/01/2018   Gastroesophageal reflux disease with esophagitis    History of pulmonary embolism (01/2018) 02/04/2018   Senile purpura (Lori [name]) 07/20/2017   Atrial fibrillation (Lori [name]) 06/16/2017   Vertigo due to brain injury (Lori [name]) 02/06/2015   Obesity (BMI 30.0-34.9) 02/06/2015   Medication management 11/18/2013   Hyperlipidemia, mixed    Vitamin D deficiency    Essential hypertension    Social History   Socioeconomic History   Marital status: Divorced    Spouse name: Not on file   Number of children: 1   Years of education: 12   Highest education level: Not on file  Occupational History   Not on file  Tobacco Use   Smoking status: Former    Types: Cigarettes    Quit date: 08/07/1990    Years since quitting: 31.2   Smokeless tobacco: Never  Vaping Use   Vaping Use: Never used  Substance and Sexual Activity   Alcohol use: No    Alcohol/week: 0.0 standard drinks of alcohol   Drug use: No   Sexual activity: Not on file  Other Topics Concern   Not on file  Social History Narrative   Not on file   Social Determinants of Health   Financial Resource Strain: Not on file  Food Insecurity: Not on file  Transportation Needs: Not on file  Physical Activity: Not on file  Stress: Not on file  Social Connections: Not on file  Intimate Partner Violence: Not on file    Lori [name]'s family history includes Cancer in her father and mother; Diabetes in her mother; Hypertension in her mother.      Objective:    Vitals:   11/06/21 1429  BP: 118/78  Pulse: (!) 57  SpO2: 96%    Physical Exam. Well-developed  well-nourished elderly white female in no acute distress.  Pleasant height, Weight, 176 BMI 29.3  HEENT; nontraumatic normocephalic, EOMI, PE R LA, sclera anicteric. Oropharynx; not examined today Neck; supple, no  JVD Cardiovascular; regular rate and rhythm with S1-S2, no murmur rub or gallop Pulmonary; clear bilaterally Abdomen; soft, nontender, nondistended, no palpable mass or hepatosplenomegaly, bowel sounds are active Rectal; stool is brown and Hemoccult negative Skin; benign exam, no jaundice rash or appreciable lesions Extremities; no clubbing cyanosis or edema skin warm and dry Neuro/Psych; alert and oriented x4, grossly nonfocal mood and affect appropriate        Assessment & [name]:   #60 81 year old white female with iron deficiency anemia, in setting of chronic Eliquis and recently symptomatic with dyspnea on exertion. Patient is Hemoccult negative today in the office and has not noted any melena or hematochezia.  Asymptomatic from a GI perspective  She has prior history of iron deficiency anemia for which she underwent work-up in 2019 with EGD and colonoscopy.  At EGD she had grade C esophagitis, and evidence of Cameron erosions and a medium sized hiatal hernia.  Iron deficiency anemia felt attributable to the [facility] erosions and she has been on PPI therapy ever since.  Etiology of recurrent iron deficiency anemia not clear.  Consider small bowel AVMs, consider persistent Cameron erosions despite chronic PPI therapy.  #2 history of PE 2019 3.  Thrombophilia 4.  History of atrial fibrillation 5.  Hypertension 6.  Previous mild pulmonary hypertension 7.  Diverticulosis 8.  Chronic kidney disease stage III  [name]; repeat CBC today Patient is scheduled for upcoming iron infusions x2 over the next week Advise she continue oral iron for now and increase to 325 twice daily Continue Protonix 40 mg p.o. every morning Would like to avoid sedation at present until she has further cardiopulmonary evaluation as scheduled. We will proceed with capsule endoscopy to further evaluate the small bowel for potential angioectasia  Further recommendations pending results of capsule endoscopy.  Meia Emley Genia Harold PA-C 11/06/2021   Cc: Unk Pinto, MD

## 2021-11-06 NOTE — Patient Instructions (Addendum)
If you are age 79 or older, your body mass index should be between 23-30. Your Body mass index is 29.39 kg/m. If this is out of the aforementioned range listed, please consider follow up with your Primary Care Provider. ________________________________________________________  The [facility] providers would like to encourage you to use [facility] to communicate with providers for non-urgent requests or questions.  Due to long hold times on the telephone, sending your provider a message by [facility] may be a faster and more efficient way to get a response.  Please allow 48 business hours for a response.  Please remember that this is for non-urgent requests.  _______________________________________________________  Your provider has requested that you go to the basement level for lab work before leaving today. Press "B" on the elevator. The lab is located at the first door on the left as you exit the elevator.  Increase your Iron to twice daily  CAPSULE ENDOSCOPY PATIENT INSTRUCTION SHEET  Lori [name] 1940/04/03 [id number]   9/4 Seven (7) days prior to capsule endoscopy stop taking iron supplements and carafate.  9/9 Two (2) days prior to capsule endoscopy stop taking aspirin or any arthritis drugs.  9/10 Day before capsule endoscopy purchase a 238 gram bottle of Miralax from the laxative section of your drug store, and a 32 oz. bottle of Gatorade (no red).    9/10 One (1) day prior to capsule endoscopy: Stop smoking. Eat a regular diet until 12:00 Noon. After 12:00 Noon take only the following: Black coffee  Jell-O (no fruit or red Jell-o) Water   Bouillon (chicken or beef) 7-Up   Cranberry Juice Tea   Kool-Aid Popsicle (not red) Sprite   Coke Ginger Ale  Pepsi Mountain Dew Gatorade At 6:00 pm the evening before your appointment, drink 7 capfuls (105 grams) of Miralax with 32 oz. Gatorade. Drink 8 oz every 15 minutes until gone. Nothing to eat or drink after midnight except medications  with a sip of water.  9/11 Day of capsule endoscopy:  No medications for 2 hours prior to your test.  Please arrive at [facility]  3rd floor patient registration area by 8:15 am on: 9/11.   For any questions: Call [facility] at [phone] and ask to speak with one of the capsule endoscopy nurses.  YOU WILL NEED TO RETURN THE EQUIPMENT AT 4 PM ON THE DAY OF THE PROCEDURE.  PLEASE KEEP THIS IN MIND WHEN SCHEDULING.   Small Bowel Capsule Endoscopy  What you should know: Small Bowel capsule endoscopy is a procedure that takes pictures of the inside of your small intestine (bowel).  Your small bowel connects to your stomach on one end, and your large bowel (colon) on the other.  A capsule endoscopy is done by swallowing a pill size camera.  The capsule moves through your stomach and into your small bowel, where pictures are taken.   You may need a small bowel capsule endoscopy if you have symptoms, such as blood in your stool, chronic stomach pain, and diarrhea.  The pictures may show if you have growths, swelling, and bleeding area in you small bowel.  A capsule endoscopy may also show if diseases such as Crohn's or celiac disease are causing your symptoms.  Having a small bowel capsule endoscopy may help you and your caregiver learn the cause of your symptoms.  Learning what is causing your symptoms allows you to receive needed treatment and prevent further problems. Risks: You may have stomach pain during your procedure.  The pictures taken by the capsule may not be clear.   The pictures may not show the cause of your symptoms.   You may need another endoscopy procedure.   The capsule may get trapped in your esophagus or intestines. You may need surgery or additional procedures to remove the capsule from your body.    Before your procedure: You will be instructed to stop certain prescription medications or over- the -counter medications prior to the procedure.   The day  before your scheduled appointment you will need to be on a restricted diet and will need to drink a bowel prep that will clean out your bowels.   The day of the procedure: You may drive yourself to the procedure.   You will need to plan on 2 trips to the office on the day of the procedure. Morning: Plan to be at the office about 45 minutes. The morning of the procedure a sensor belt and recorder will be placed on you.  You will wear this for 8 hours.  (The sensor belt transfers pictures of your small bowel to the recorder.)   You will be given a pill-sized capsule endoscope to swallow.  Once you swallow the capsule it will travel through your body the same way food does, constantly taking pictures along the way.  The capsule takes 2-3 pictures a second.   Once you have left the office you may go about your normal day with a few exceptions: You may not go near a MRI machine or a radio or television towers; You need to avoid other patients having capsule endoscopy; You will be given a written diet to follow for the day.  Afternoon: You will need to be return to the office at your designated time. The sensors belt will be removed You will need to be at the office about 15 minutes.  Follow up pending the results of your Capsule Endoscopy.  Thank you for entrusting me with your care and choosing [facility].  Amy Esterwood, PA-C

## 2021-11-07 ENCOUNTER — Inpatient Hospital Stay: Payer: PPO

## 2021-11-07 ENCOUNTER — Other Ambulatory Visit: Payer: Self-pay

## 2021-11-07 VITALS — BP 148/66 | HR 63 | Temp 98.0°F | Resp 18

## 2021-11-07 DIAGNOSIS — D500 Iron deficiency anemia secondary to blood loss (chronic): Secondary | ICD-10-CM | POA: Diagnosis not present

## 2021-11-07 MED ORDER — SODIUM CHLORIDE 0.9 % IV SOLN: Freq: Once | INTRAVENOUS | Status: AC

## 2021-11-07 MED ORDER — SODIUM CHLORIDE 0.9 % IV SOLN
400.0000 mg | Freq: Once | INTRAVENOUS | Status: AC
  Administered 2021-11-07: 400 mg via INTRAVENOUS
  Filled 2021-11-07: qty 20

## 2021-11-07 NOTE — Patient Instructions (Signed)

## 2021-11-07 NOTE — Progress Notes (Signed)
Patient stayed her 30 minute post observation period. Tolerated first iron infusion well, VSS and no signs of distress noted at discharge.

## 2021-11-14 ENCOUNTER — Inpatient Hospital Stay: Payer: PPO | Attending: Hematology and Oncology

## 2021-11-14 ENCOUNTER — Other Ambulatory Visit: Payer: Self-pay

## 2021-11-14 VITALS — BP 147/70 | HR 57 | Temp 97.6°F | Resp 18

## 2021-11-14 DIAGNOSIS — D500 Iron deficiency anemia secondary to blood loss (chronic): Secondary | ICD-10-CM | POA: Diagnosis not present

## 2021-11-14 MED ORDER — SODIUM CHLORIDE 0.9 % IV SOLN
400.0000 mg | Freq: Once | INTRAVENOUS | Status: AC
  Administered 2021-11-14: 400 mg via INTRAVENOUS
  Filled 2021-11-14: qty 20

## 2021-11-14 MED ORDER — SODIUM CHLORIDE 0.9 % IV SOLN: Freq: Once | INTRAVENOUS | Status: AC

## 2021-11-14 NOTE — Progress Notes (Signed)
Pt observed for 30 minutes post iron infusion. VSS, no signs of distress noted upon discharge.

## 2021-11-14 NOTE — Patient Instructions (Signed)

## 2021-11-19 ENCOUNTER — Ambulatory Visit: Payer: PPO

## 2021-11-19 DIAGNOSIS — I2781 Cor pulmonale (chronic): Secondary | ICD-10-CM | POA: Diagnosis not present

## 2021-11-19 DIAGNOSIS — I480 Paroxysmal atrial fibrillation: Secondary | ICD-10-CM

## 2021-11-19 DIAGNOSIS — R0609 Other forms of dyspnea: Secondary | ICD-10-CM

## 2021-11-25 ENCOUNTER — Telehealth: Payer: Self-pay

## 2021-11-25 NOTE — Patient Outreach (Signed)
  Care Coordination   11/25/2021 Name: Lori [name] MRN: [id number] DOB: December 18, 1940   Care Coordination Outreach Attempts:  An unsuccessful telephone outreach was attempted today to offer the patient information about available care coordination services as a benefit of their health plan.   Follow Up Plan:  Additional outreach attempts will be made to offer the patient care coordination information and services.   Encounter Outcome:  No Answer  Care Coordination Interventions Activated:  No   Care Coordination Interventions:  No, not indicated    Jone Baseman, RN, MSN [facility] Care Management Care Management Coordinator Direct Line [phone] Toll Free: [phone]  Fax: [phone]

## 2021-11-28 ENCOUNTER — Telehealth: Payer: Self-pay

## 2021-11-28 NOTE — Telephone Encounter (Signed)
You can change her visit to 3 months. Echo is stable. Depending upon her pulmonary evaluation, will decide what should be done for his dyspnea

## 2021-12-01 ENCOUNTER — Telehealth: Payer: Self-pay

## 2021-12-01 ENCOUNTER — Other Ambulatory Visit: Payer: Self-pay

## 2021-12-01 DIAGNOSIS — D500 Iron deficiency anemia secondary to blood loss (chronic): Secondary | ICD-10-CM

## 2021-12-01 NOTE — Telephone Encounter (Signed)
Called and spoke to patient she voiced understanding and her appt was rescheduled. Patient said her sob has improved she believes her sob was due to the hot weather she has no AC in her home or vehicle and the only air she was getting was from sitting in her porch. Patient stated since the weather got cooler she is able to walk and walk her dogs without difficulty

## 2021-12-01 NOTE — Telephone Encounter (Signed)
Pt called and asked if we can send lab orders to Dr Melford Aase for her visit with [place] 11/17. Pt states she is on a fixed income and gas is too expensive for her to drive to [place] twice in a month for labs. Advised pt we would send orders to Dr [facility] office. CBC W/DIFF, CMP, IRON & BINDING, FERRITIN orders faxed to [phone]. Fax confirmation received.

## 2021-12-03 ENCOUNTER — Telehealth: Payer: Self-pay

## 2021-12-03 NOTE — Patient Outreach (Signed)
  Care Coordination   12/03/2021 Name: Lori [name] MRN: [id number] DOB: [birth year]   Care Coordination Outreach Attempts:  A second unsuccessful outreach was attempted today to offer the patient with information about available care coordination services as a benefit of their health plan.     Follow Up Plan:  Additional outreach attempts will be made to offer the patient care coordination information and services.   Encounter Outcome:  No Answer  Care Coordination Interventions Activated:  No   Care Coordination Interventions:  No, not indicated    Jone Baseman, RN, MSN [facility] Care Management Care Management Coordinator Direct Line [phone]

## 2021-12-08 ENCOUNTER — Ambulatory Visit: Payer: PPO | Admitting: Cardiology

## 2021-12-10 ENCOUNTER — Telehealth: Payer: Self-pay

## 2021-12-10 NOTE — Patient Instructions (Signed)
Visit Information  Thank you for taking time to visit with me today. Please don't hesitate to contact me if I can be of assistance to you.   Following are the goals we discussed today:   Goals Addressed             This Visit's Progress    COMPLETED: Care Coordination Activites-No follow up required       Care Coordination Interventions: Advised patient to schedule annual wellness visit. Patient states scheduled for 11-1           If you are experiencing a Mental Health or [place] or need someone to talk to, please call the Suicide and Crisis Lifeline: 988   Patient verbalizes understanding of instructions and care plan provided today and agrees to view in [place]. Active MyChart status and patient understanding of how to access instructions and care plan via MyChart confirmed with patient.     No further follow up required: patient decline  Jone Baseman, RN, MSN [facility] Care Management Coordinator Direct Line [phone]

## 2021-12-10 NOTE — Patient Outreach (Signed)
  Care Coordination   Initial Visit Note   12/10/2021 Name: TANGIA PINARD MRN: [id number] DOB: [birth year]  KHALISE BILLARD is a 81 y.o. year old female who sees Unk Pinto, MD for primary care. I spoke with  [name] by phone today.  What matters to the patients health and wellness today?  none    Goals Addressed             This Visit's Progress    COMPLETED: Care Coordination Activites-No follow up required       Care Coordination Interventions: Advised patient to schedule annual wellness visit. Patient states scheduled for 11-1          SDOH assessments and interventions completed:  Yes     Care Coordination Interventions Activated:  Yes  Care Coordination Interventions:  Yes, provided   Follow up plan: No further intervention required.   Encounter Outcome:  Pt. Visit Completed   Jone Baseman, RN, MSN [place] Management Care Management Coordinator Direct Line [phone]

## 2021-12-11 ENCOUNTER — Ambulatory Visit: Payer: PPO | Admitting: Pulmonary Disease

## 2021-12-23 ENCOUNTER — Other Ambulatory Visit: Payer: Self-pay | Admitting: *Deleted

## 2021-12-23 DIAGNOSIS — D500 Iron deficiency anemia secondary to blood loss (chronic): Secondary | ICD-10-CM

## 2021-12-23 NOTE — Progress Notes (Signed)
Pt requesting lab order request be faxed to Dr. Marcy Siren office [phone]).  Pt states she will be seen by PCP 01/14/22 and would like to save on gas and not come to the cancer center for lab draw.  RN successfully faxed lab request [phone]).

## 2022-01-09 ENCOUNTER — Other Ambulatory Visit (INDEPENDENT_AMBULATORY_CARE_PROVIDER_SITE_OTHER): Payer: Self-pay | Admitting: Physician Assistant

## 2022-01-09 ENCOUNTER — Encounter (INDEPENDENT_AMBULATORY_CARE_PROVIDER_SITE_OTHER): Payer: Self-pay

## 2022-01-09 DIAGNOSIS — R072 Precordial pain: Secondary | ICD-10-CM

## 2022-01-09 DIAGNOSIS — I2510 Atherosclerotic heart disease of native coronary artery without angina pectoris: Secondary | ICD-10-CM

## 2022-01-09 MED ORDER — METOPROLOL SUCCINATE ER 25 MG TABLET,EXTENDED RELEASE 24 HR
25.0000 mg | ORAL_TABLET | Freq: Every day | ORAL | 3 refills | Status: DC
Start: 2022-01-09 — End: 2023-01-14

## 2022-01-09 MED ORDER — ATORVASTATIN 40 MG TABLET
40.0000 mg | ORAL_TABLET | Freq: Every evening | ORAL | 3 refills | Status: DC
Start: 2022-01-09 — End: 2022-12-30

## 2022-01-09 MED ORDER — ISOSORBIDE MONONITRATE ER 60 MG TABLET,EXTENDED RELEASE 24 HR
60.0000 mg | ORAL_TABLET | Freq: Every day | ORAL | 3 refills | Status: DC
Start: 2022-01-09 — End: 2023-01-14

## 2022-01-09 MED ORDER — CLOPIDOGREL 75 MG TABLET
75.0000 mg | ORAL_TABLET | Freq: Every day | ORAL | 3 refills | Status: DC
Start: 2022-01-09 — End: 2022-12-30

## 2022-01-13 ENCOUNTER — Ambulatory Visit: Payer: PPO | Admitting: Gastroenterology

## 2022-01-14 ENCOUNTER — Encounter: Payer: Self-pay | Admitting: Internal Medicine

## 2022-01-14 ENCOUNTER — Ambulatory Visit (INDEPENDENT_AMBULATORY_CARE_PROVIDER_SITE_OTHER): Payer: PPO | Admitting: Internal Medicine

## 2022-01-14 VITALS — BP 120/64 | HR 62 | Temp 97.8°F | Resp 17 | Ht 65.0 in | Wt 190.8 lb

## 2022-01-14 DIAGNOSIS — R7309 Other abnormal glucose: Secondary | ICD-10-CM | POA: Diagnosis not present

## 2022-01-14 DIAGNOSIS — E782 Mixed hyperlipidemia: Secondary | ICD-10-CM | POA: Diagnosis not present

## 2022-01-14 DIAGNOSIS — Z862 Personal history of diseases of the blood and blood-forming organs and certain disorders involving the immune mechanism: Secondary | ICD-10-CM | POA: Diagnosis not present

## 2022-01-14 DIAGNOSIS — E559 Vitamin D deficiency, unspecified: Secondary | ICD-10-CM

## 2022-01-14 DIAGNOSIS — K219 Gastro-esophageal reflux disease without esophagitis: Secondary | ICD-10-CM | POA: Diagnosis not present

## 2022-01-14 DIAGNOSIS — I10 Essential (primary) hypertension: Secondary | ICD-10-CM | POA: Diagnosis not present

## 2022-01-14 DIAGNOSIS — D500 Iron deficiency anemia secondary to blood loss (chronic): Secondary | ICD-10-CM

## 2022-01-14 DIAGNOSIS — Z79899 Other long term (current) drug therapy: Secondary | ICD-10-CM | POA: Diagnosis not present

## 2022-01-14 DIAGNOSIS — I482 Chronic atrial fibrillation, unspecified: Secondary | ICD-10-CM | POA: Diagnosis not present

## 2022-01-14 DIAGNOSIS — D6859 Other primary thrombophilia: Secondary | ICD-10-CM

## 2022-01-14 NOTE — Progress Notes (Signed)
Future Appointments  Date Time Provider Department  01/14/2022                              6 mo ov 11:30 AM Unk Pinto, MD [facility]  02/03/2022 11:15 AM Nicholas Lose, MD [facility]  03/02/2022  2:30 PM Adrian Prows, MD [facility]  07/09/2022                               cpe           10:00 AM Unk Pinto, MD [facility]  10/08/2022 10:00 AM Darrol Jump, NP [facility]    History of Present Illness:      This very nice 81 y.o.  DWF presents for 6 month follow up with HTN, HLD, Pre-Diabetes and Vitamin D Deficiency.   In 2001, she had surgery for a Brain Aneurysmand has had  intermittant vertigo since then.  Patient also has hx/o Iron Deficiency anemia due to GI B  & is followed by Dr Lindi Adie and receives iron infusions at the [facility] . She had GI w/u for same in 2019 by Dr Fuller Plan finding a small polyp, Diverticular Dz & Int Hemorrhoids.   EGD found Esophagitis & Gastritis.            Patient is treated for HTN (2009) . BP has been controlled at home. Today's BP is at goal - 120/64 .   In 2009 she had a False (+) Myoview and a Negative Heart Cath. Patient is followed by Dr Einar Gip for pAfib (2019) . Patient has had no complaints of any cardiac type chest pain, palpitations, dyspnea / orthopnea / PND, dizziness, claudication, or dependent edema.        Hyperlipidemia is controlled with diet & meds. Patient denies myalgias or other med SE's. Last Lipids were at goal with sl. Elevated Trig's :  Lab Results  Component Value Date   CHOL 162 10/02/2019   HDL 44 (L) 10/02/2019   LDLCALC 92 10/02/2019   TRIG 165 (H) 10/02/2019   CHOLHDL 3.7 10/02/2019     Also, the patient has history of PreDiabetes (A1c 5.9% /2013) and has had no symptoms of reactive hypoglycemia, diabetic polys, paresthesias or visual blurring.  Last A1c was Normal & at goal:  Lab Results  Component Value Date   HGBA1C 5.6 07/04/2021           Further, the patient also has history of Vitamin D  Deficiency ("11" /2008) and supplements vitamin D without any suspected side-effects. Last vitamin D was near goal:  Lab Results  Component Value Date   VD25OH 45 07/04/2021       Current Outpatient Medications  Medication Instructions   acetaminophen   1,000 mg  Every 6 hours PRN   apixaban (ELIQUIS) 5 MG TABS  Take  1 tablet  2 x /day     Cholecalciferol  1,000 Units   Daily   [facility] MAGNESIUM   Daily   Diazepam 5 MG tablet Take  1/2 to 1 tablet  Daily  for Dizziness    ferrous sulfate  325 mg  Daily with breakfast   gabapentin 300 MG  Take  1 capsule  3 x /day for Nerve Pain        metoprolol tartrate 25 MG tablet Take  1 tablet  Daily  for BP & Heart   pantoprazole  40 MG tablet Take  1 tablet  Daily  to Prevent Heartburn    triamterene-hctz 37.5-25 MG  1 tablet As needed   ANORO ELLIPTA  62.5-25  1 Inhalation Daily     No Known Allergies  PMHx:   Past Medical History:  Diagnosis Date   Anemia    Arthritis    Benign neoplasm of sigmoid colon    Deafness    left ear per pt    Dyspnea    pt reports with exertion due to hip and knees    Dysrhythmia    afib    History of bleeding ulcers    2019 received 3-4 units of blood    Hyperlipidemia    Hypertension    Pericardial effusion 02/04/2018   Pre-diabetes    per Dr Einar Gip note of 09/28/19 pt denies at preop visit on 10/06/19    Prediabetes    Primary localized osteoarthritis of right hip 09/26/2019   Pulmonary embolism ([place])    hx of 2019    Vertigo    Vitamin D deficiency     Immunization History  Administered Date(s) Administered   DT  01/24/2014   Pneumococcal - 13 05/26/2018   Pneumococcal - 23 10/06/2011   Td 04/23/2003    Past Surgical History:  Procedure Laterality Date   BIOPSY  02/05/2018   Procedure: BIOPSY;  Surgeon: Ladene Artist, MD;  Location: [facility];  Service: Endoscopy;;   CATARACT EXTRACTION W/ INTRAOCULAR LENS  IMPLANT, BILATERAL  2009   CEREBRAL ANEURYSM REPAIR     COLONOSCOPY N/A  02/05/2018   Procedure: COLONOSCOPY;  Surgeon: Ladene Artist, MD;  Location: [facility];  Service: Endoscopy;  Laterality: N/A;   ESOPHAGOGASTRODUODENOSCOPY N/A 02/05/2018   Procedure: ESOPHAGOGASTRODUODENOSCOPY (EGD);  Surgeon: Ladene Artist, MD;  Location: [facility];  Service: Endoscopy;  Laterality: N/A;   FOOT SURGERY  1993   POLYPECTOMY  02/05/2018   Procedure: POLYPECTOMY;  Surgeon: Ladene Artist, MD;  Location: [facility];  Service: Endoscopy;;   TOTAL HIP ARTHROPLASTY Right 10/17/2019   Procedure: TOTAL HIP ARTHROPLASTY ANTERIOR APPROACH;  Surgeon: Renette Butters, MD;  Location: [facility];  Service: Orthopedics;  Laterality: Right;   TUBAL LIGATION      FHx:    Reviewed / unchanged  SHx:    Reviewed / unchanged   Systems Review:  Constitutional: Denies fever, chills, wt changes, headaches, insomnia, fatigue, night sweats, change in appetite. Eyes: Denies redness, blurred vision, diplopia, discharge, itchy, watery eyes.  ENT: Denies discharge, congestion, post nasal drip, epistaxis, sore throat, earache, hearing loss, dental pain, tinnitus, vertigo, sinus pain, snoring.  CV: Denies chest pain, palpitations, irregular heartbeat, syncope, dyspnea, diaphoresis, orthopnea, PND, claudication or edema. Respiratory: denies cough, dyspnea, DOE, pleurisy, hoarseness, laryngitis, wheezing.  Gastrointestinal: Denies dysphagia, odynophagia, heartburn, reflux, water brash, abdominal pain or cramps, nausea, vomiting, bloating, diarrhea, constipation, hematemesis, melena, hematochezia  or hemorrhoids. Genitourinary: Denies dysuria, frequency, urgency, nocturia, hesitancy, discharge, hematuria or flank pain. Musculoskeletal: Denies arthralgias, myalgias, stiffness, jt. swelling, pain, limping or strain/sprain.  Skin: Denies pruritus, rash, hives, warts, acne, eczema or change in skin lesion(s). Neuro: No weakness, tremor, incoordination, spasms, paresthesia or pain. Psychiatric:  Denies confusion, memory loss or sensory loss. Endo: Denies change in weight, skin or hair change.  Heme/Lymph: No excessive bleeding, bruising or enlarged lymph nodes.  Physical Exam  BP 120/64   Pulse 62   Temp 97.8 F (36.6 C)   Resp 17   Ht 5'  5" (1.651 m)   Wt 190 lb 12.8 oz (86.5 kg)   SpO2 98%   BMI 31.75 kg/m   Appears  well nourished, well groomed  and in no distress.  Eyes: PERRLA, EOMs, conjunctiva no swelling or erythema. Sinuses: No frontal/maxillary tenderness ENT/Mouth: EAC's clear, TM's nl w/o erythema, bulging. Nares clear w/o erythema, swelling, exudates. Oropharynx clear without erythema or exudates. Oral hygiene is good. Tongue normal, non obstructing. Hearing intact.  Neck: Supple. Thyroid not palpable. Car 2+/2+ without bruits, nodes or JVD. Chest: Respirations nl with BS clear & equal w/o rales, rhonchi, wheezing or stridor.  Cor: Heart sounds normal w/ regular rate and rhythm without sig. murmurs, gallops, clicks or rubs. Peripheral pulses normal and equal  without edema.  Abdomen: Soft & bowel sounds normal. Non-tender w/o guarding, rebound, hernias, masses or organomegaly.  Lymphatics: Unremarkable.  Musculoskeletal: Full ROM all peripheral extremities, joint stability, 5/5 strength and normal gait.  Skin: Warm, dry without exposed rashes, lesions or ecchymosis apparent.  Neuro: Cranial nerves intact, reflexes equal bilaterally. Sensory-motor testing grossly intact. Tendon reflexes grossly intact.  Pysch: Alert & oriented x 3.  Insight and judgement nl & appropriate. No ideations.   Assessment and Plan:  1. Essential hypertension  - CBC with Differential/Platelet - COMPLETE METABOLIC PANEL WITH GFR - Magnesium - TSH  2. Hyperlipidemia, mixed  - Lipid panel - TSH  3. Abnormal glucose  - Hemoglobin A1c - Insulin, random  4. Vitamin D deficiency  - VITAMIN D 25 Hydroxy   5. Chronic atrial fibrillation ([name])  - TSH  6. Gastroesophageal  reflux disease without esophagitis  - CBC with Differential/Platelet  7. Thrombophilia ([name])  - CBC with Differential/Platelet  8. History of iron deficiency anemia due to Blood Loss  - CBC with Differential/Platelet - Iron, Total/Total Iron Binding Cap - Ferritin  9. Medication management  - CBC with Differential/Platelet - COMPLETE METABOLIC PANEL WITH GFR - Magnesium - Lipid panel - TSH - Hemoglobin A1c - Insulin, random - VITAMIN D 25 Hydroxy - Iron, Total/Total Iron Binding Cap - Ferritin         Discussed  regular exercise, BP monitoring, weight control to achieve/maintain BMI less than 25 and discussed med and SE's. Recommended labs to assess and monitor clinical status with further disposition pending results of labs.  I discussed the assessment and treatment plan with the patient. The patient was provided an opportunity to ask questions and all were answered. The patient agreed with the plan and demonstrated an understanding of the instructions.  I provided over 30 minutes of exam, counseling, chart review and  complex critical decision making.   Kirtland Bouchard, MD

## 2022-01-14 NOTE — Patient Instructions (Signed)

## 2022-01-15 ENCOUNTER — Other Ambulatory Visit: Payer: Self-pay | Admitting: Nurse Practitioner

## 2022-01-15 DIAGNOSIS — S069XAA Unspecified intracranial injury with loss of consciousness status unknown, initial encounter: Secondary | ICD-10-CM

## 2022-01-15 LAB — COMPLETE METABOLIC PANEL WITH GFR
AG Ratio: 2.2 (calc) (ref 1.0–2.5)
ALT: 10 U/L (ref 6–29)
AST: 18 U/L (ref 10–35)
Albumin: 4.2 g/dL (ref 3.6–5.1)
Alkaline phosphatase (APISO): 88 U/L (ref 37–153)
BUN: 20 mg/dL (ref 7–25)
CO2: 31 mmol/L (ref 20–32)
Calcium: 9.6 mg/dL (ref 8.6–10.4)
Chloride: 106 mmol/L (ref 98–110)
Creat: 0.82 mg/dL (ref 0.60–1.00)
Globulin: 1.9 g/dL (calc) (ref 1.9–3.7)
Glucose, Bld: 60 mg/dL — ABNORMAL LOW (ref 65–99)
Potassium: 5 mmol/L (ref 3.5–5.3)
Sodium: 144 mmol/L (ref 135–146)
Total Bilirubin: 0.3 mg/dL (ref 0.2–1.2)
Total Protein: 6.1 g/dL (ref 6.1–8.1)
eGFR: 73 mL/min/{1.73_m2} (ref >=60)

## 2022-01-15 LAB — LIPID PANEL
Cholesterol: 165 mg/dL (ref <200)
HDL: 58 mg/dL (ref >=50)
LDL Cholesterol (Calc): 87 mg/dL (calc)
Non-HDL Cholesterol (Calc): 107 mg/dL (calc) (ref <130)
Total CHOL/HDL Ratio: 2.8 (calc) (ref <5.0)
Triglycerides: 100 mg/dL (ref <150)

## 2022-01-15 LAB — HEMOGLOBIN A1C
Hgb A1c MFr Bld: 5.3 % of total Hgb (ref <5.7)
Mean Plasma Glucose: 105 mg/dL
eAG (mmol/L): 5.8 mmol/L

## 2022-01-15 LAB — CBC WITH DIFFERENTIAL/PLATELET
Absolute Monocytes: 547 cells/uL (ref 200–950)
Basophils Absolute: 58 cells/uL (ref 0–200)
Basophils Relative: 1.2 %
Eosinophils Absolute: 67 cells/uL (ref 15–500)
Eosinophils Relative: 1.4 %
HCT: 36.9 % (ref 35.0–45.0)
Hemoglobin: 11.8 g/dL (ref 11.7–15.5)
Lymphs Abs: 1354 cells/uL (ref 850–3900)
MCH: 28.8 pg (ref 27.0–33.0)
MCHC: 32 g/dL (ref 32.0–36.0)
MCV: 90 fL (ref 80.0–100.0)
Monocytes Relative: 11.4 %
Neutro Abs: 2774 cells/uL (ref 1500–7800)
Neutrophils Relative %: 57.8 %
Platelets: 191 10*3/uL (ref 140–400)
RBC: 4.1 10*6/uL (ref 3.80–5.10)
RDW: 17.8 % — ABNORMAL HIGH (ref 11.0–15.0)
Total Lymphocyte: 28.2 %
WBC: 4.8 10*3/uL (ref 3.8–10.8)

## 2022-01-15 LAB — IRON, TOTAL/TOTAL IRON BINDING CAP
%SAT: 13 % (calc) — ABNORMAL LOW (ref 16–45)
Iron: 48 ug/dL (ref 45–160)
TIBC: 357 mcg/dL (calc) (ref 250–450)

## 2022-01-15 LAB — FERRITIN: Ferritin: 31 ng/mL (ref 16–288)

## 2022-01-15 LAB — VITAMIN D 25 HYDROXY (VIT D DEFICIENCY, FRACTURES): Vit D, 25-Hydroxy: 57 ng/mL (ref 30–100)

## 2022-01-15 LAB — MAGNESIUM: Magnesium: 2.1 mg/dL (ref 1.5–2.5)

## 2022-01-15 LAB — INSULIN, RANDOM: Insulin: 4.5 u[IU]/mL

## 2022-01-15 LAB — TSH: TSH: 5.66 mIU/L — ABNORMAL HIGH (ref 0.40–4.50)

## 2022-01-15 NOTE — Progress Notes (Signed)
<><><><><><><><><><><><><><><><><><><><><><><><><><><><><><><><><> <><><><><><><><><><><><><><><><><><><><><><><><><><><><><><><><><> -   Test results slightly outside the reference range are not unusual. If there is anything important, I will review this with you,  otherwise it is considered normal test values.  If you have further questions,  please do not hesitate to contact me at the office or via My Chart.  <><><><><><><><><><><><><><><><><><><><><><><><><><><><><><><><><> <><><><><><><><><><><><><><><><><><><><><><><><><><><><><><><><><>  -  TSH is elevated which implies that Thyroid hormone may be low in blood   ==================================================================  - But  if taking Biotin will make it look like the Thyroid is too low in the blood &then   If the Doctor prescribes thyroid, then there would be too much in the blood &  could cause a heart arrhythmia or severe hypertension leading to a stroke or heart attack  ===================================================================  SO , IT'S EXTREMELY IMPORTANT NOT TO TAKE ANY SUPPLEMENTS WITH BIOTIN IN IT  !  ===================================================================  - usual amount of Biotin iin a Multivitamin is about 30 mcg ( & is OK) , but  Unfortunately it's frequently sold OTC alone or in Skin, hair, nail products in    amounts of 1,000 mcg - 2,500 mcg to 5,000 mcg &I've even seen it at 10,000 mcg ===================================================================  - The problem being is that Biotin causes the lab analyzer machines   that measure the thyroid hormone (&other blood measurements) to   yield incorrect results &if the Medical provider is unaware of this,   then incorrect changes in medicines ( thyroid &other ) are recommended based on   incorrect results &there have been reports of Death due to patients taking Biotin.   But since it's not a  regulated substance, the FDA does nothing ! <><><><><><><><><><><><><><><><><><><><><><><><><><><><><><><><><>  - So is you are NOT taking any Biotin , then we need to recheck your thyroid level in about a month  - please call office to schedule a Nurse Visit to Recheck the blood test.  <><><><><><><><><><><><><><><><><><><><><><><><><><><><><><><><><>  -  Iron level is low , So recommend take an OTC iron tablet 2 x /day . Also ,   - Eat more Veggies with Iron as Carrots, Beets , all leafy green veggies as Spinach,  Collards,  Turnip - Mustard or Mixed Greens, Kale, Asparagus, Broccoli, Brussel  Sprouts, Green Beans / peas, Soybeans, Lentils, Sweet Potatoes <><><><><><><><><><><><><><><><><><><><><><><><><><><><><><><><><>  -  Total Chol = 165 -  Excellent   !   - Very low risk for Heart Attack  / Stroke <><><><><><><><><><><><><><><><><><><><><><><><><><><><><><><><><>  -  A1c is Normal - No Diabetes  - Great ! <><><><><><><><><><><><><><><><><><><><><><><><><><><><><><><><><>  -  Vitamin D = 33 - Great - Please continue dose same  <><><><><><><><><><><><><><><><><><><><><><><><><><><><><><><><><>  -  All Else - CBC - Kidneys - Electrolytes - Liver - Magnesium & Thyroid    - all  Normal / OK <><><><><><><><><><><><><><><><><><><><><><><><><><><><><><><><><> <><><><><><><><><><><><><><><><><><><><><><><><><><><><><><><><><>

## 2022-01-17 ENCOUNTER — Encounter: Payer: Self-pay | Admitting: Internal Medicine

## 2022-01-30 ENCOUNTER — Inpatient Hospital Stay: Payer: PPO

## 2022-02-02 NOTE — Progress Notes (Signed)
HEMATOLOGY-ONCOLOGY TELEPHONE VISIT PROGRESS NOTE  I connected with our patient on 02/03/22 at 11:15 AM EST by telephone and verified that I am speaking with the correct person using two identifiers.  I discussed the limitations, risks, security and privacy concerns of performing an evaluation and management service by telephone and the availability of in person appointments.  I also discussed with the patient that there may be a patient responsible charge related to this service. The patient expressed understanding and agreed to proceed.   History of Present Illness: Lori [name] 81 y.o. female  with a diagnosis of iron deficiency. She presents to the clinic for via phone follow-up.  REVIEW OF SYSTEMS:   Constitutional: Denies fevers, chills or abnormal weight loss All other systems were reviewed with the patient and are negative. Observations/Objective:     Assessment Plan:  Iron deficiency anemia due to chronic blood loss Lab review 10/14/2021: Hemoglobin 8.2, MCV 73.5, RDW 16.5, CMP is normal, TIBC 457, iron saturation 4%, ferritin 3  01/14/2022: Hemoglobin 11.8, MCV 90, ferritin 31  IV iron: November 2019, August 2023 Based on the above lab results she does not need IV iron at this time. She gets labs with her primary care physician every 3 months.  She will call us if she is running low again so that she can receive additional IV iron.  She did not want to duplicate the lab work and therefore we will see her on an as-needed basis. F/U on an as needed basis    I discussed the assessment and treatment plan with the patient. The patient was provided an opportunity to ask questions and all were answered. The patient agreed with the plan and demonstrated an understanding of the instructions. The patient was advised to call back or seek an in-person evaluation if the symptoms worsen or if the condition fails to improve as anticipated.   I provided 12 minutes of non-face-to-face time during  this encounter.  This includes time for charting and coordination of care   Harriette Ohara, MD  I Gardiner Coins am scribing for Dr. Lindi Adie  I have reviewed the above documentation for accuracy and completeness, and I agree with the above.

## 2022-02-03 ENCOUNTER — Inpatient Hospital Stay: Payer: PPO | Attending: Hematology and Oncology | Admitting: Hematology and Oncology

## 2022-02-03 DIAGNOSIS — D500 Iron deficiency anemia secondary to blood loss (chronic): Secondary | ICD-10-CM

## 2022-02-03 NOTE — Assessment & Plan Note (Signed)
Lab review 10/14/2021: Hemoglobin 8.2, MCV 73.5, RDW 16.5, CMP is normal, TIBC 457, iron saturation 4%, ferritin 3  01/14/2022: Hemoglobin 11.8, MCV 90, ferritin 31  IV iron: November 2019, August 2023 Based on the above lab results she does not need IV iron at this time.  Recheck labs in 6 months and follow-up 2 days later with a telephone visit.

## 2022-02-20 ENCOUNTER — Encounter (INDEPENDENT_AMBULATORY_CARE_PROVIDER_SITE_OTHER): Payer: Self-pay

## 2022-02-20 DIAGNOSIS — E785 Hyperlipidemia, unspecified: Secondary | ICD-10-CM | POA: Insufficient documentation

## 2022-02-20 DIAGNOSIS — I2510 Atherosclerotic heart disease of native coronary artery without angina pectoris: Secondary | ICD-10-CM | POA: Insufficient documentation

## 2022-02-20 DIAGNOSIS — I10 Essential (primary) hypertension: Secondary | ICD-10-CM | POA: Insufficient documentation

## 2022-02-23 ENCOUNTER — Other Ambulatory Visit: Payer: Self-pay

## 2022-02-23 ENCOUNTER — Ambulatory Visit (INDEPENDENT_AMBULATORY_CARE_PROVIDER_SITE_OTHER): Payer: Medicare Other | Admitting: Physician Assistant

## 2022-02-23 ENCOUNTER — Encounter (INDEPENDENT_AMBULATORY_CARE_PROVIDER_SITE_OTHER): Payer: Self-pay

## 2022-02-23 VITALS — BP 120/70 | HR 63 | Ht 65.0 in | Wt 176.0 lb

## 2022-02-23 DIAGNOSIS — E782 Mixed hyperlipidemia: Secondary | ICD-10-CM

## 2022-02-23 DIAGNOSIS — Z955 Presence of coronary angioplasty implant and graft: Secondary | ICD-10-CM

## 2022-02-23 DIAGNOSIS — G4733 Obstructive sleep apnea (adult) (pediatric): Secondary | ICD-10-CM

## 2022-02-23 DIAGNOSIS — I471 Supraventricular tachycardia, unspecified: Secondary | ICD-10-CM

## 2022-02-23 DIAGNOSIS — I2510 Atherosclerotic heart disease of native coronary artery without angina pectoris: Secondary | ICD-10-CM

## 2022-02-23 DIAGNOSIS — Z9989 Dependence on other enabling machines and devices: Secondary | ICD-10-CM

## 2022-02-23 DIAGNOSIS — E119 Type 2 diabetes mellitus without complications: Secondary | ICD-10-CM

## 2022-02-23 DIAGNOSIS — R0789 Other chest pain: Secondary | ICD-10-CM | POA: Insufficient documentation

## 2022-02-23 DIAGNOSIS — R072 Precordial pain: Secondary | ICD-10-CM

## 2022-02-23 DIAGNOSIS — R0602 Shortness of breath: Secondary | ICD-10-CM

## 2022-02-23 DIAGNOSIS — I10 Essential (primary) hypertension: Secondary | ICD-10-CM

## 2022-02-23 MED ORDER — NITROGLYCERIN 0.4 MG SUBLINGUAL TABLET
0.4000 mg | SUBLINGUAL_TABLET | SUBLINGUAL | 3 refills | Status: DC | PRN
Start: 2022-02-23 — End: 2023-08-18

## 2022-02-23 NOTE — Procedures (Signed)
[address]  [place] [address]    Procedure Note    Name: Archita Lomeli MRN:  [id number]   Date: 02/23/2022 Age: 81 y.o.  DOB:   1940-03-17       ECG - In Clinic    Performed by: Freda Munro, PA-C  Authorized by: Freda Munro, PA-C      Sinus rhythm, short run of atrial tachycardia    Freda Munro, PA-C

## 2022-02-23 NOTE — Progress Notes (Signed)
Cardiology [facility]  [address] [place] [address]  [phone]    Cardiology  Clinic Note    Name: Melissa Wolf   DOB: 1940-08-01  [81 y.o. female]   MRN: [id number]       Visit Date: 02/23/2022   Referring: Lorita Officer, DO  [facility]  PHYSICIAN CLINIC  [address]  [place],  [place] [address]   PCP: Lorita Officer, DO         Chief Complaint: Annual Exam, Chest Pain , and Shortness of Breath      History of Present Illness   Melissa Wolf is a 81 y.o. White female who presents for her annual evaluation.  She has a history of coronary artery disease and had catheter intervention of the circumflex 2010 in the LAD 2016.  Catheterization 12/23/2017 showed calcification of the left main and all 3 vessels with full patency of the stents with no coronary insufficiency and normal systolic left ventricular function by noninvasive testing.  She also has a history of right subclavian stenosis at its origin of uncertain severity.  She has been evaluated by vascular.  She has chronic shortness of breath which has gotten noticeably worse over the past few months.  She also has fatigue and decreased activity tolerance.  For the past few months she has had episodes mild dull chest pain.  This can occur with rest or exertion and seems to be random.  There are no associated symptoms and no specific precipitating or alleviating factors.  She denies orthopnea, PND, palpitations, presyncope, syncope, edema.      EKG today:  Sinus rhythm with short run of atrial arrhythmia.    We will try to obtain a copy of recent lipid panel in routine labs from her primary care provider.    Patient Active Problem List    Diagnosis Date Noted    Chest discomfort 02/23/2022    History of coronary artery stent placement 02/23/2022    Shortness of breath 02/23/2022    DM (diabetes mellitus) (CMS [name]) 02/23/2022    OSA on CPAP 02/23/2022    HTN  (hypertension) 02/20/2022    Hyperlipidemia 02/20/2022    CAD (coronary artery disease) 02/20/2022       Allergies  Allergies   Allergen Reactions    Codeine      Pt doesn't remember exact rxn       Medications    Current Outpatient Medications:     aspirin 81 mg Oral Tablet, Chewable, Chew 1 Tablet (81 mg total) Twice daily, Disp: , Rfl:     atorvastatin (LIPITOR) 40 mg Oral Tablet, Take 1 Tablet (40 mg total) by mouth Every evening, Disp: 90 Tablet, Rfl: 3    cholecalciferol, vitamin D3, 25 mcg (1,000 unit) Oral Tablet, Take 1 Tablet (1,000 Units total) by mouth Once a day, Disp: , Rfl:     clopidogreL (PLAVIX) 75 mg Oral Tablet, Take 1 Tablet (75 mg total) by mouth Once a day, Disp: 90 Tablet, Rfl: 3    insulin aspart (NOVOLOG) 100 unit/mL Subcutaneous Insulin Pen, Inject 0-12 Units under the skin Four times a day - before meals and bedtime, Disp: , Rfl:     insulin detemir (LEVEMIR FLEXPEN SUBQ), Inject 40 Units under the skin, Disp: , Rfl:     isosorbide mononitrate (IMDUR) 60 mg Oral Tablet Sustained Release 24 hr, Take 1 Tablet (60 mg total) by mouth  Once a day, Disp: 90 Tablet, Rfl: 3    MetFORMIN (GLUCOPHAGE) 1,000 mg Oral Tablet, Take 1 Tablet (1,000 mg total) by mouth Twice daily with food, Disp: , Rfl:     metoprolol succinate (TOPROL-XL) 25 mg Oral Tablet Sustained Release 24 hr, Take 1 Tablet (25 mg total) by mouth Once a day, Disp: 90 Tablet, Rfl: 3    nitroGLYCERIN (NITROSTAT) 0.4 mg Sublingual Tablet, Sublingual, Place 1 Tablet (0.4 mg total) under the tongue Every 5 minutes as needed for Chest pain for 3 doses over 15 minutes, Disp: 25 Tablet, Rfl: 3    ondansetron (ZOFRAN) 4 mg Oral Tablet, Take 1 Tablet (4 mg total) by mouth Every 8 hours as needed for Nausea/Vomiting, Disp: 20 Tablet, Rfl: 0    pantoprazole (PROTONIX) 40 mg Oral Tablet, Delayed Release (E.C.), Take 1 Tablet (40 mg total) by mouth Once a day, Disp: , Rfl:     sertraline (ZOLOFT) 25 mg Oral Tablet, Take 1 Tablet (25 mg total) by  mouth Once a day, Disp: , Rfl:     syringe-needle,insulin,0.5 mL (INSULIN SYRINGE N/A), , Disp: , Rfl:     History  Past Medical History:   Diagnosis Date    Atrial ectopy     CAD (coronary artery disease)     Diabetes mellitus, type 2 (CMS [name])     GERD (gastroesophageal reflux disease)     History of coronary artery stent placement     HTN (hypertension)     Hyperlipidemia     Myocardial infarction (CMS [name])     OA (osteoarthritis)     OSA (obstructive sleep apnea)     PAD (peripheral artery disease) (CMS [name])     Syncope and collapse     Wears glasses          Past Surgical History:   Procedure Laterality Date    HAND SURGERY      Plate in right hand from fx    HX CORONARY STENT PLACEMENT      HX HYSTERECTOMY      HX TONSILLECTOMY      SHOULDER SURGERY Bilateral      Social History     Socioeconomic History    Marital status: Widowed   Tobacco Use    Smoking status: Never    Smokeless tobacco: Never   Substance and Sexual Activity    Alcohol use: Not Currently    Drug use: Never     Family Medical History:       Problem Relation (Age of Onset)    Coronary Artery Disease Sister    Heart Attack Sister              Review of Systems:  Constitutional: Fatigue and decreased activity tolerance.  No significant weight gain or loss, or fever.  Respiratory: Shortness of breath, OSA.  No emphysema, COPD, black lung  Cardiovascular:  Chest pain.  No palpitations, orthopnea, PND, claudication or edema.  Gastrointestinal: No nausea, vomiting, diarrhea.  Endocrine: Diabetes.  No thyroid problems  Musculoskeletal: Arthritis. No history of gout  Neurologic: No history of strokes or seizures, dizziness, presyncope or syncope.  Psychiatric: Anxiety and depression.      Physical Examination:  BP 120/70   Pulse 63   Ht 1.651 m (5\' 5" )   Wt 79.8 kg (176 lb)   BMI 29.29 kg/m       General: Alert, no acute distress  Neck: No carotid bruits, No JVD  Lungs: Clear to  auscultation bilaterally, non-labored respiration.  Heart: Normal  rate, regular rhythm, no murmur.  Abdomen: Soft, non-tender.  Neurologic: No motor or sensory deficits.  Extremities:  No edema, normal peripheral pulse.        Orders Placed This Encounter    ECG - In Clinic    TRANSTHORACIC ECHOCARDIOGRAM - ADULT    MYOCARDIAL PERFUSION COMPLETE    nitroGLYCERIN (NITROSTAT) 0.4 mg Sublingual Tablet, Sublingual       Medications Discontinued During This Encounter   Medication Reason    clopidogrel bisulfate (CLOPIDOGREL ORAL) Patient states no longer taking    nitroGLYCERIN (NITROSTAT) 0.4 mg Sublingual Tablet, Sublingual Reorder       Assessment and Plan:  Assessment/Plan   1. Coronary artery disease involving native coronary artery of native heart without angina pectoris    2. History of coronary artery stent placement    3. Precordial pain    4. Shortness of breath    5. HTN (hypertension)    6. Mixed hyperlipidemia    7. DM (diabetes mellitus) (CMS [name])    8. OSA on CPAP        Ms. Montilla has known coronary disease and is having chest symptoms with typical and atypical features as well as worsening exertional shortness of breath and fatigue and decreased activity tolerance concerning for accelerating anginal equivalent symptoms.  She was unable to exercise adequately for stress testing due to orthopedic issues and unsteady gait.  She will be scheduled for a pharmacologic nuclear study to assess for coronary insufficiency.  She will also have an echocardiogram to evaluate her current left ventricular systolic and diastolic function, valvular status, pulmonary pressure.  We will continue with her current medications.  She was asked to contact us or go to the emergency room if there are any changes in her cardiac status.  Further recommendations will be made as data becomes available.      Thank you for allowing [place] to participate in care of your patient.  If we can be of further assistance in her care at any time please let us know.      Sincerely,     Billie Lade,  PA-C      Follow up:  Return in about 6 weeks (around 04/06/2022).        Jori Moll, PA-C  [facility]  Cardiology  [facility]    A portion of this documentation may have been generated using [employer] voice recognition software and may contain syntax/voice recognition errors.

## 2022-02-25 ENCOUNTER — Telehealth: Payer: Self-pay | Admitting: Cardiology

## 2022-02-25 NOTE — Telephone Encounter (Signed)
You can change her visits to PRN and agree that she can follow with PCP for Atrial fibrillation and I am happy to see her anytime

## 2022-02-25 NOTE — Telephone Encounter (Signed)
Patient says she doesn't feel her scheduled appointment on 03/02/22 is necessary. She drives herself from [place], and says she gets afraid driving on the highway to come here. She recently saw her PCP (through [facility]), and she is following up care with her PCP on 04/16/2022.

## 2022-03-02 ENCOUNTER — Ambulatory Visit: Payer: PPO | Admitting: Cardiology

## 2022-03-03 ENCOUNTER — Other Ambulatory Visit: Payer: Self-pay

## 2022-03-03 ENCOUNTER — Inpatient Hospital Stay (INDEPENDENT_AMBULATORY_CARE_PROVIDER_SITE_OTHER)
Admission: RE | Admit: 2022-03-03 | Discharge: 2022-03-03 | Disposition: A | Discharge status: Home / Self Care | Payer: Medicare Other | Source: Ambulatory Visit | Admitting: NUCLEAR MEDICINE

## 2022-03-03 ENCOUNTER — Inpatient Hospital Stay (INDEPENDENT_AMBULATORY_CARE_PROVIDER_SITE_OTHER)
Admission: RE | Admit: 2022-03-03 | Discharge: 2022-03-03 | Disposition: A | Discharge status: Home / Self Care | Payer: Medicare Other | Source: Ambulatory Visit

## 2022-03-03 DIAGNOSIS — I517 Cardiomegaly: Secondary | ICD-10-CM

## 2022-03-03 DIAGNOSIS — Z955 Presence of coronary angioplasty implant and graft: Secondary | ICD-10-CM

## 2022-03-03 DIAGNOSIS — R072 Precordial pain: Secondary | ICD-10-CM

## 2022-03-03 DIAGNOSIS — I2510 Atherosclerotic heart disease of native coronary artery without angina pectoris: Secondary | ICD-10-CM

## 2022-03-03 DIAGNOSIS — R9439 Abnormal result of other cardiovascular function study: Secondary | ICD-10-CM

## 2022-03-03 DIAGNOSIS — R0602 Shortness of breath: Secondary | ICD-10-CM

## 2022-03-03 DIAGNOSIS — I259 Chronic ischemic heart disease, unspecified: Secondary | ICD-10-CM

## 2022-03-03 NOTE — Progress Notes (Signed)
Pt presents for evaluation of CP, SOB and fatigue. Has h/o CAD with PCI Cx in 2010 and LAD in 2016.     Myocardial perfusion imaging and a complete echocardiogram were performed in the office today. Results are pending. They will be forwarded to you under separate cover as soon as they are available. Further recommendations will be forthcoming pending the findings on today's studies.

## 2022-03-04 ENCOUNTER — Ambulatory Visit (HOSPITAL_COMMUNITY): Payer: Self-pay | Admitting: Interventional Cardiology

## 2022-03-04 ENCOUNTER — Encounter (HOSPITAL_BASED_OUTPATIENT_CLINIC_OR_DEPARTMENT_OTHER): Payer: Medicare Other

## 2022-03-05 ENCOUNTER — Encounter (HOSPITAL_BASED_OUTPATIENT_CLINIC_OR_DEPARTMENT_OTHER): Payer: Medicare Other

## 2022-03-05 DIAGNOSIS — I10 Essential (primary) hypertension: Secondary | ICD-10-CM

## 2022-03-05 DIAGNOSIS — I25110 Atherosclerotic heart disease of native coronary artery with unstable angina pectoris: Secondary | ICD-10-CM

## 2022-03-05 DIAGNOSIS — I2583 Coronary atherosclerosis due to lipid rich plaque: Secondary | ICD-10-CM

## 2022-03-05 DIAGNOSIS — R7989 Other specified abnormal findings of blood chemistry: Secondary | ICD-10-CM

## 2022-03-05 DIAGNOSIS — I214 Non-ST elevation (NSTEMI) myocardial infarction: Secondary | ICD-10-CM

## 2022-03-05 DIAGNOSIS — I519 Heart disease, unspecified: Secondary | ICD-10-CM

## 2022-03-05 DIAGNOSIS — I517 Cardiomegaly: Secondary | ICD-10-CM

## 2022-03-05 DIAGNOSIS — E119 Type 2 diabetes mellitus without complications: Secondary | ICD-10-CM

## 2022-03-05 DIAGNOSIS — I700 Atherosclerosis of aorta: Secondary | ICD-10-CM

## 2022-03-05 DIAGNOSIS — E785 Hyperlipidemia, unspecified: Secondary | ICD-10-CM

## 2022-03-05 DIAGNOSIS — I3481 Nonrheumatic mitral (valve) annulus calcification: Secondary | ICD-10-CM

## 2022-03-05 DIAGNOSIS — I2584 Coronary atherosclerosis due to calcified coronary lesion: Secondary | ICD-10-CM

## 2022-03-06 ENCOUNTER — Encounter (HOSPITAL_COMMUNITY): Payer: Medicare Other

## 2022-03-06 ENCOUNTER — Other Ambulatory Visit: Payer: Self-pay | Admitting: PHYSICIAN ASSISTANT

## 2022-03-06 DIAGNOSIS — I214 Non-ST elevation (NSTEMI) myocardial infarction: Secondary | ICD-10-CM

## 2022-04-15 NOTE — Progress Notes (Unsigned)
MEDICARE ANNUAL WELLNESS VISIT AND 3 MONTH FOLLOW UP  Assessment:   Encounter for Medicare annual wellness exam Due annually Health maintenance reviewed  Essential hypertension Controlled  Continue Metoprolol Continue medications;  Discussed DASH (Dietary Approaches to Stop Hypertension) DASH diet is lower in sodium than a typical American diet. Cut back on foods that are high in saturated fat, cholesterol, and trans fats. Eat more whole-grain foods, fish, poultry, and nuts Remain active and exercise as tolerated daily.  Monitor BP at home-Call if greater than 130/80.    Chronic atrial fibrillation ([name])/ thrombophilia ([place]) Follows with Cardiology, Dr. Einar Gip. Rate controlled, on NOAC for chadsvasc of 4 Continue anticoagulant Apixaban, Metoprolol   History of PE Continue Eliquis. Hx of atrial fibrillation  No S&S of bleeding  Vertigo due to brain injury ([place]) Valium PRN Move slowly  Mixed hyperlipidemia Controlled Continue medications Discussed lifestyle modifications. Recommended diet heavy in fruits and veggies, omega 3's. Decrease consumption of animal meats, cheeses, and dairy products. Remain active and exercise as tolerated. Continue to monitor.   Iron deficiency due to chronic blood loss  Follows Dr. Lindi Adie, Oncology Monitor CBC, iron panel; anticipate chronic iron replacement needs secondary to esophagitis and cameron's lesion  Abnormal glucose Education: Reviewed 'ABCs' of diabetes management  A1C (<7) Blood pressure (<130/80) Cholesterol (LDL <70) Continue Eye Exam yearly  Continue Dental Exam Q6 mo Discussed dietary recommendations Discussed Physical Activity recommendations Monitor A1C  Vitamin D deficiency Continue supplement Check Vitamin D levels   Overweight  Discussed appropriate BMI Diet modification. Physical activity. Encouraged/praised to build confidence.   Senile purpura ([name]) R/t eliquis and age; Discussed process,  protect skin, sunscreen  Varicose veins Discussed weight loss discussed, continue compression stockings and elevation  GERD/cameron lesions Continue Pantoprazole. No suspected reflux complications ([place]/stricture). Lifestyle modification:  wt loss, avoid meals 2-3h before bedtime. Consider eliminating food triggers:  chocolate, caffeine, EtOH, acid/spicy food.   Medication Management All medications discussed and reviewed in full. All questions and concerns regarding medications addressed.      Over 40 minutes of face to face interview, exam, counseling, chart review and critical decision making was performed Future Appointments  Date Time Provider [place]  07/09/2022 10:00 AM Unk Pinto, MD [facility] None  04/19/2023 11:00 AM Darrol Jump, NP [facility] None     Plan:   During the course of the visit the patient was educated and counseled about appropriate screening and preventive services including:   Pneumococcal vaccine  Prevnar 13 Influenza vaccine Td vaccine Screening electrocardiogram Bone densitometry screening Colorectal cancer screening Diabetes screening Glaucoma screening Nutrition counseling  Advanced directives: requested   Subjective:  Lori [name] is a 82 y.o. female who presents for Medicare Annual Wellness Visit and 3 month follow up  She reports over all she is doing well.  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  She s/p TRH 10/17/19 and reports she is doing very well, she completed PT.  She ambulates with a cane.  Reports she is having some sciatic pains now on left side and working to improve this with Ortho, Dr Percell Miller.  Patient was hospitalized in Nov 2019  with a severe GI bleed (hgb 5 gm%) requiring  pRBC transfusions and found to have esophagitis, erosive gastropathy, Cameron lesion and a small colon polyp & was initiated on PPI. CBCs and iron levels have since resolved to normal range,  continues on daily iron.   Saw Hematology on 10/30/21 for  tmt of IDA then followed up again on 01/2022 for IV iron infusions.  She is to continue to consult with Dr. Collene Mares with gastroenterology for upper endoscopy and colonoscopy***  She takes CBD oil PRN anxiety and LE pain, uses valium rarelly, tylenol as well for pain with aleve occasionally and reports she is managing well for vertigo and anxiety.    BMI is Body mass index is 30.29 kg/m., she has not been working on diet and exercise.   Wt Readings from Last 3 Encounters:  04/16/22 182 lb (82.6 kg)  01/14/22 190 lb 12.8 oz (86.5 kg)  11/06/21 176 lb 9.6 oz (80.1 kg)   Her blood pressure has been controlled at home, today their BP is BP: 118/76  She does not workout but is very active with 35 dogs and goats. She denies chest pain, shortness of breath, dizziness. In 2009, she had a false (+) Myoview confirmed by a Negative Heart Cath. Last Echo 09/13/19, EF 69%  She has history of Afib, CHA2DS2VASC 4, declined anticoag/NOAC for many years until recent PE and now doing well on eliquis.  She follows with Cardiology.   She is not on cholesterol medication and denies myalgias. Her LDL cholesterol is at goal. The cholesterol last visit was:   Lab Results  Component Value Date   CHOL 165 01/14/2022   HDL 58 01/14/2022   LDLCALC 87 01/14/2022   TRIG 100 01/14/2022   CHOLHDL 2.8 01/14/2022    She has been working on diet and exercise for prediabetes, and denies polydipsia, polyuria and visual disturbances. Last A1C in the office was:  Lab Results  Component Value Date   HGBA1C 5.3 01/14/2022   Recently had episode of AKI that did improve with cessation of diuretic/NSAID and advised increased water, admits 2-3 bottles of water, 2 bottles of pepsi  Lab Results  Component Value Date   GFRNONAA 61 07/02/2020   GFRNONAA 52 (L) 04/03/2020   GFRNONAA 43 (L) 01/02/2020   Lab Results  Component Value Date   CREATININE 0.82 01/14/2022    CREATININE 0.99 10/14/2021   CREATININE 1.02 (H) 07/04/2021   Patient is on Vitamin D supplement.   Lab Results  Component Value Date   VD25OH 57 01/14/2022      Blood pressure 118/76, pulse (!) 55, temperature (!) 97.5 F (36.4 C), height '5\' 5"'$  (1.651 m), weight 182 lb (82.6 kg), SpO2 99 %.  Medications   Current Outpatient Medications (Cardiovascular):  .  metoprolol tartrate (LOPRESSOR) 25 MG tablet, Take  1 tablet  Daily  for BP & Heart .  triamterene-hydrochlorothiazide (MAXZIDE-25) 37.5-25 MG tablet, Take 1 tablet by mouth as needed. (Patient not taking: Reported on 04/16/2022)  Current Outpatient Medications (Respiratory):  .  umeclidinium-vilanterol (ANORO ELLIPTA) 62.5-25 MCG/ACT AEPB, Inhale 1 puff into the lungs daily.  Current Outpatient Medications (Analgesics):  .  acetaminophen (TYLENOL) 500 MG tablet, Take 1,000 mg by mouth every 6 (six) hours as needed for mild pain or moderate pain.  Current Outpatient Medications (Hematological):  .  apixaban (ELIQUIS) 5 MG TABS tablet, Take  1 tablet  2 x /day  to Prevent Blood Clots / Patient knows to take by mouth .  ferrous sulfate 325 (65 FE) MG tablet, Take 325 mg by mouth daily with breakfast.  Current Outpatient Medications (Other):  Marland Kitchen  Cholecalciferol 25 MCG (1000 UT) capsule, Take 1,000 Units by mouth daily.  .  [employer] MAGNESIUM PO, Take by mouth. Brand is Maganese .  diazepam (  VALIUM) 5 MG tablet, TAKE 1/2-1 TABLET BY MOUTH 2 TO 3 TIMES DAILY FOR DIZZINESS/VERTIGO .  gabapentin (NEURONTIN) 300 MG capsule, Take  1 capsule  3 x /day for Nerve Pain                                                  /                    TAKE                           BY                     MOUTH .  pantoprazole (PROTONIX) 40 MG tablet, Take  1 tablet  Daily  to Prevent Heartburn & Indigestion   No Known Allergies  Current Problems (verified) Patient Active Problem List   Diagnosis Date Noted  . Thrombophilia ([place]) 04/20/2019  . Spinal  stenosis of lumbar region 04/20/2019  . Iron deficiency anemia due to chronic blood loss 03/08/2019  . Varicose vein of leg 09/01/2018  . Gastroesophageal reflux disease with esophagitis   . History of pulmonary embolism (01/2018) 02/04/2018  . Senile purpura ([place]) 07/20/2017  . Atrial fibrillation ([place]) 06/16/2017  . Vertigo due to brain injury ([place]) 02/06/2015  . Obesity (BMI 30.0-34.9) 02/06/2015  . Medication management 11/18/2013  . Hyperlipidemia, mixed   . Vitamin D deficiency   . Essential hypertension    Screening Tests Immunization History  Administered Date(s) Administered  . DT (Pediatric) 01/24/2014  . Pneumococcal Conjugate-13 05/26/2018  . Pneumococcal Polysaccharide-23 10/06/2011  . Td 04/23/2003   Preventative care: Last colonoscopy: 01/2018 no further follow up secondary to age per Dr. Fuller Plan Last EGD: 01/2018 - esophagitis, cameron lesions, anticipate chronic iron loss Last mammogram: 09/2012 - she states if she did have cancer she would not want treatment Last pap smear/pelvic exam: 2009 DEXA: 2011 normal, declines further  Prior vaccinations: TD  2015  Influenza: declines Pneumococcal: 2013 Prevnar13: 2020 Shingles/Zostavax: declines Covid 19: declines   Hep C: screening done today with patient permission  Names of Other Physician/Practitioners you currently use: 1. [facility] here for primary care 2. [place], eye doctor, 2023 3. Needs one, dentist, remote, encouraged to schedule   Patient Care Team: Unk Pinto, MD as PCP - General (Internal Medicine) Unk Pinto, MD as PCP - Internal Medicine (Internal Medicine) Clent Jacks, MD as Consulting Physician (Ophthalmology) Ladene Artist, MD as Consulting Physician (Gastroenterology) Newton Pigg, [facility] (Inactive) as Pharmacist (Pharmacist)  SURGICAL HISTORY She  has a past surgical history that includes Tubal ligation; Cerebral aneurysm  repair; Cataract extraction w/ intraocular lens  implant, bilateral (2009); Foot surgery (1993); Colonoscopy (N/A, 02/05/2018); Esophagogastroduodenoscopy (N/A, 02/05/2018); biopsy (02/05/2018); polypectomy (02/05/2018); and Total hip arthroplasty (Right, 10/17/2019). FAMILY HISTORY Her family history includes Cancer in her father and mother; Diabetes in her mother; Hypertension in her mother. SOCIAL HISTORY She  reports that she quit smoking about 31 years ago. Her smoking use included cigarettes. She has never used smokeless tobacco. She reports that she does not drink alcohol and does not use drugs.   MEDICARE WELLNESS OBJECTIVES: Physical activity: Current Exercise Habits: Home exercise routine, Exercise limited by: orthopedic condition(s);cardiac condition(s) Cardiac risk factors:  Depression/mood screen:      04/16/2022   11:48 AM  Depression screen PHQ 2/9  Decreased Interest 0  Down, Depressed, Hopeless 0  PHQ - 2 Score 0    ADLs:     04/16/2022   11:44 AM 01/17/2022   11:53 PM  In your present state of health, do you have any difficulty performing the following activities:  Hearing? 1 0  Vision? 0 0  Difficulty concentrating or making decisions? 0 0  Walking or climbing stairs? 0 0  Dressing or bathing? 0 0  Doing errands, shopping? 0 0  Preparing Food and eating ? N   Using the Toilet? N   In the past six months, have you accidently leaked urine? N   Do you have problems with loss of bowel control? N   Managing your Medications? N   Managing your Finances? N   Housekeeping or managing your Housekeeping? N      Cognitive Testing  Alert? Yes  Normal Appearance?Yes  Oriented to person? Yes  Place? Yes   Time? Yes  Recall of three objects?  Yes  Can perform simple calculations? Yes  Displays appropriate judgment?Yes  Can read the correct time from a watch face?Yes  EOL planning:    Review of Systems  Constitutional: Negative.  Negative for malaise/fatigue and  weight loss.  HENT: Negative.  Negative for hearing loss and tinnitus.   Eyes: Negative.  Negative for blurred vision and double vision.  Respiratory: Negative.  Negative for cough, sputum production, shortness of breath and wheezing.   Cardiovascular: Negative.  Negative for chest pain, palpitations, orthopnea, claudication, leg swelling and PND.  Gastrointestinal: Negative.  Negative for abdominal pain, blood in stool, constipation, diarrhea, heartburn, melena, nausea and vomiting.  Genitourinary: Negative.   Musculoskeletal:  Positive for joint pain (Left hip). Negative for falls and myalgias.  Skin: Negative.  Negative for rash.  Neurological:  Negative for dizziness, tingling, sensory change, weakness and headaches.  Endo/Heme/Allergies:  Negative for polydipsia.  Psychiatric/Behavioral: Negative.  Negative for depression, memory loss, substance abuse and suicidal ideas. The patient is not nervous/anxious and does not have insomnia.   All other systems reviewed and are negative.    Objective:     Today's Vitals   04/16/22 1108  BP: 118/76  Pulse: (!) 55  Temp: (!) 97.5 F (36.4 C)  SpO2: 99%  Weight: 182 lb (82.6 kg)  Height: '5\' 5"'$  (1.651 m)   Body mass index is 30.29 kg/m.  General appearance: alert, no distress, WD/WN, female HEENT: normocephalic, sclerae anicteric, TMs pearly, nares patent, no discharge or erythema, pharynx normal Oral cavity: MMM, no lesions Neck: supple, no lymphadenopathy, no thyromegaly, no masses Heart: RRR, normal S1, S2, no murmurs Lungs: CTA bilaterally, no wheezes, rhonchi, or rales Abdomen: +bs, soft, non tender, non distended, no masses, no hepatomegaly, no splenomegaly Musculoskeletal: nontender, no swelling, very slow sitting to standing, slow antalgic gait, bil wrists with bony enlargement without effusion Extremities: no edema, no cyanosis, no clubbing, numerous superficial varicose veins of bilateral lower legs Pulses: 2+ symmetric,  upper and lower extremities, normal cap refill Neurological: alert, oriented x 3, CN2-12 intact, strength normal upper extremities and lower extremities, sensation normal throughout, DTRs 2+ throughout, no cerebellar signs, gait antalgic Psychiatric: normal affect, behavior normal, pleasant   Medicare Attestation I have personally reviewed: The patient's medical and social history Their use of alcohol, tobacco or illicit drugs Their current medications and supplements The patient's functional ability including ADLs,fall  risks, home safety risks, cognitive, and hearing and visual impairment Diet and physical activities Evidence for depression or mood disorders  The patient's weight, height, BMI, and visual acuity have been recorded in the chart.  I have made referrals, counseling, and provided education to the patient based on review of the above and I have provided the patient with a written personalized care plan for preventive services.     Darrol Jump, NP   04/03/2020

## 2022-04-16 ENCOUNTER — Ambulatory Visit (INDEPENDENT_AMBULATORY_CARE_PROVIDER_SITE_OTHER): Payer: PPO | Admitting: Nurse Practitioner

## 2022-04-16 VITALS — BP 118/76 | HR 55 | Temp 97.5°F | Ht 65.0 in | Wt 182.0 lb

## 2022-04-16 DIAGNOSIS — D509 Iron deficiency anemia, unspecified: Secondary | ICD-10-CM

## 2022-04-16 DIAGNOSIS — E669 Obesity, unspecified: Secondary | ICD-10-CM

## 2022-04-16 DIAGNOSIS — I482 Chronic atrial fibrillation, unspecified: Secondary | ICD-10-CM | POA: Diagnosis not present

## 2022-04-16 DIAGNOSIS — R7309 Other abnormal glucose: Secondary | ICD-10-CM | POA: Diagnosis not present

## 2022-04-16 DIAGNOSIS — E559 Vitamin D deficiency, unspecified: Secondary | ICD-10-CM

## 2022-04-16 DIAGNOSIS — E782 Mixed hyperlipidemia: Secondary | ICD-10-CM | POA: Diagnosis not present

## 2022-04-16 DIAGNOSIS — Z0001 Encounter for general adult medical examination with abnormal findings: Secondary | ICD-10-CM | POA: Diagnosis not present

## 2022-04-16 DIAGNOSIS — K2101 Gastro-esophageal reflux disease with esophagitis, with bleeding: Secondary | ICD-10-CM | POA: Diagnosis not present

## 2022-04-16 DIAGNOSIS — I83813 Varicose veins of bilateral lower extremities with pain: Secondary | ICD-10-CM | POA: Diagnosis not present

## 2022-04-16 DIAGNOSIS — D692 Other nonthrombocytopenic purpura: Secondary | ICD-10-CM | POA: Diagnosis not present

## 2022-04-16 DIAGNOSIS — I10 Essential (primary) hypertension: Secondary | ICD-10-CM | POA: Diagnosis not present

## 2022-04-16 DIAGNOSIS — R42 Dizziness and giddiness: Secondary | ICD-10-CM | POA: Diagnosis not present

## 2022-04-16 DIAGNOSIS — R6889 Other general symptoms and signs: Secondary | ICD-10-CM | POA: Diagnosis not present

## 2022-04-16 DIAGNOSIS — Z79899 Other long term (current) drug therapy: Secondary | ICD-10-CM | POA: Diagnosis not present

## 2022-04-16 DIAGNOSIS — Z0000 Encounter for general adult medical examination without abnormal findings: Secondary | ICD-10-CM

## 2022-04-16 DIAGNOSIS — Z86711 Personal history of pulmonary embolism: Secondary | ICD-10-CM

## 2022-04-16 DIAGNOSIS — E66811 Obesity, class 1: Secondary | ICD-10-CM

## 2022-04-16 NOTE — Patient Instructions (Signed)

## 2022-04-17 ENCOUNTER — Observation Stay (HOSPITAL_COMMUNITY)
Admission: EM | Admit: 2022-04-17 | Discharge: 2022-04-18 | Disposition: A | Discharge status: Home / Self Care | Payer: PPO | Attending: Family Medicine | Admitting: Family Medicine

## 2022-04-17 ENCOUNTER — Other Ambulatory Visit: Payer: Self-pay | Admitting: Nurse Practitioner

## 2022-04-17 ENCOUNTER — Other Ambulatory Visit: Payer: Self-pay

## 2022-04-17 ENCOUNTER — Encounter (HOSPITAL_COMMUNITY): Payer: Self-pay | Admitting: Family Medicine

## 2022-04-17 ENCOUNTER — Encounter: Payer: Self-pay | Admitting: Nurse Practitioner

## 2022-04-17 DIAGNOSIS — E782 Mixed hyperlipidemia: Secondary | ICD-10-CM | POA: Diagnosis present

## 2022-04-17 DIAGNOSIS — I10 Essential (primary) hypertension: Secondary | ICD-10-CM | POA: Diagnosis present

## 2022-04-17 DIAGNOSIS — D649 Anemia, unspecified: Secondary | ICD-10-CM | POA: Diagnosis not present

## 2022-04-17 DIAGNOSIS — E669 Obesity, unspecified: Secondary | ICD-10-CM | POA: Diagnosis not present

## 2022-04-17 DIAGNOSIS — R7989 Other specified abnormal findings of blood chemistry: Secondary | ICD-10-CM | POA: Diagnosis present

## 2022-04-17 DIAGNOSIS — D500 Iron deficiency anemia secondary to blood loss (chronic): Secondary | ICD-10-CM

## 2022-04-17 DIAGNOSIS — Z7901 Long term (current) use of anticoagulants: Secondary | ICD-10-CM | POA: Diagnosis not present

## 2022-04-17 DIAGNOSIS — Z87891 Personal history of nicotine dependence: Secondary | ICD-10-CM | POA: Diagnosis not present

## 2022-04-17 DIAGNOSIS — I4891 Unspecified atrial fibrillation: Secondary | ICD-10-CM | POA: Diagnosis present

## 2022-04-17 DIAGNOSIS — R42 Dizziness and giddiness: Secondary | ICD-10-CM

## 2022-04-17 DIAGNOSIS — Z85038 Personal history of other malignant neoplasm of large intestine: Secondary | ICD-10-CM | POA: Diagnosis not present

## 2022-04-17 DIAGNOSIS — E559 Vitamin D deficiency, unspecified: Secondary | ICD-10-CM | POA: Diagnosis present

## 2022-04-17 DIAGNOSIS — K2100 Gastro-esophageal reflux disease with esophagitis, without bleeding: Secondary | ICD-10-CM | POA: Diagnosis not present

## 2022-04-17 DIAGNOSIS — E66811 Obesity, class 1: Secondary | ICD-10-CM | POA: Diagnosis present

## 2022-04-17 DIAGNOSIS — Z6834 Body mass index (BMI) 34.0-34.9, adult: Secondary | ICD-10-CM | POA: Diagnosis not present

## 2022-04-17 DIAGNOSIS — Z86711 Personal history of pulmonary embolism: Secondary | ICD-10-CM | POA: Diagnosis present

## 2022-04-17 DIAGNOSIS — I480 Paroxysmal atrial fibrillation: Secondary | ICD-10-CM | POA: Insufficient documentation

## 2022-04-17 DIAGNOSIS — Z96641 Presence of right artificial hip joint: Secondary | ICD-10-CM | POA: Insufficient documentation

## 2022-04-17 DIAGNOSIS — D509 Iron deficiency anemia, unspecified: Secondary | ICD-10-CM

## 2022-04-17 HISTORY — DX: Iron deficiency anemia, unspecified: D50.9

## 2022-04-17 LAB — CBC WITH DIFFERENTIAL/PLATELET
Abs Immature Granulocytes: 0.02 10*3/uL (ref 0.00–0.07)
Absolute Monocytes: 751 cells/uL (ref 200–950)
Basophils Absolute: 49 cells/uL (ref 0–200)
Basophils Absolute: 0 10*3/uL (ref 0.0–0.1)
Basophils Relative: 0.9 %
Basophils Relative: 1 %
Eosinophils Absolute: 70 cells/uL (ref 15–500)
Eosinophils Absolute: 0.1 10*3/uL (ref 0.0–0.5)
Eosinophils Relative: 1.3 %
Eosinophils Relative: 2 %
HCT: 24.3 % — ABNORMAL LOW (ref 35.0–45.0)
HCT: 25.7 % — ABNORMAL LOW (ref 36.0–46.0)
Hemoglobin: 7.2 g/dL — ABNORMAL LOW (ref 11.7–15.5)
Hemoglobin: 7.3 g/dL — ABNORMAL LOW (ref 12.0–15.0)
Immature Granulocytes: 0 %
Lymphocytes Relative: 21 %
Lymphs Abs: 1399 cells/uL (ref 850–3900)
Lymphs Abs: 1 10*3/uL (ref 0.7–4.0)
MCH: 24.7 pg — ABNORMAL LOW (ref 27.0–33.0)
MCH: 24.7 pg — ABNORMAL LOW (ref 26.0–34.0)
MCHC: 29.6 g/dL — ABNORMAL LOW (ref 32.0–36.0)
MCHC: 28.4 g/dL — ABNORMAL LOW (ref 30.0–36.0)
MCV: 83.5 fL (ref 80.0–100.0)
MCV: 86.8 fL (ref 80.0–100.0)
MPV: 12.7 fL — ABNORMAL HIGH (ref 7.5–12.5)
Monocytes Absolute: 0.5 10*3/uL (ref 0.1–1.0)
Monocytes Relative: 13.9 %
Monocytes Relative: 11 %
Neutro Abs: 3132 cells/uL (ref 1500–7800)
Neutro Abs: 3.1 10*3/uL (ref 1.7–7.7)
Neutrophils Relative %: 58 %
Neutrophils Relative %: 65 %
Platelets: 294 10*3/uL (ref 140–400)
Platelets: 234 10*3/uL (ref 150–400)
RBC: 2.91 10*6/uL — ABNORMAL LOW (ref 3.80–5.10)
RBC: 2.96 MIL/uL — ABNORMAL LOW (ref 3.87–5.11)
RDW: 15 % (ref 11.0–15.0)
RDW: 15.6 % — ABNORMAL HIGH (ref 11.5–15.5)
Total Lymphocyte: 25.9 %
WBC: 5.4 10*3/uL (ref 3.8–10.8)
WBC: 4.7 10*3/uL (ref 4.0–10.5)
nRBC: 0 % (ref 0.0–0.2)

## 2022-04-17 LAB — COMPLETE METABOLIC PANEL WITH GFR
AG Ratio: 2.1 (calc) (ref 1.0–2.5)
ALT: 8 U/L (ref 6–29)
AST: 13 U/L (ref 10–35)
Albumin: 4 g/dL (ref 3.6–5.1)
Alkaline phosphatase (APISO): 78 U/L (ref 37–153)
BUN/Creatinine Ratio: 14 (calc) (ref 6–22)
BUN: 15 mg/dL (ref 7–25)
CO2: 27 mmol/L (ref 20–32)
Calcium: 9.3 mg/dL (ref 8.6–10.4)
Chloride: 108 mmol/L (ref 98–110)
Creat: 1.06 mg/dL — ABNORMAL HIGH (ref 0.60–1.00)
Globulin: 1.9 g/dL (calc) (ref 1.9–3.7)
Glucose, Bld: 83 mg/dL (ref 65–99)
Potassium: 5.1 mmol/L (ref 3.5–5.3)
Sodium: 142 mmol/L (ref 135–146)
Total Bilirubin: 0.3 mg/dL (ref 0.2–1.2)
Total Protein: 5.9 g/dL — ABNORMAL LOW (ref 6.1–8.1)
eGFR: 53 mL/min/{1.73_m2} — ABNORMAL LOW (ref >=60)

## 2022-04-17 LAB — IRON AND TIBC
Iron: 15 ug/dL — ABNORMAL LOW (ref 28–170)
Saturation Ratios: 4 % — ABNORMAL LOW (ref 10.4–31.8)
TIBC: 434 ug/dL (ref 250–450)
UIBC: 419 ug/dL

## 2022-04-17 LAB — COMPREHENSIVE METABOLIC PANEL
ALT: 10 U/L (ref 0–44)
AST: 15 U/L (ref 15–41)
Albumin: 3.9 g/dL (ref 3.5–5.0)
Alkaline Phosphatase: 76 U/L (ref 38–126)
Anion gap: 7 (ref 5–15)
BUN: 20 mg/dL (ref 8–23)
CO2: 26 mmol/L (ref 22–32)
Calcium: 8.8 mg/dL — ABNORMAL LOW (ref 8.9–10.3)
Chloride: 107 mmol/L (ref 98–111)
Creatinine, Ser: 1.09 mg/dL — ABNORMAL HIGH (ref 0.44–1.00)
GFR, Estimated: 52 mL/min — ABNORMAL LOW (ref >60)
Glucose, Bld: 104 mg/dL — ABNORMAL HIGH (ref 70–99)
Potassium: 4.4 mmol/L (ref 3.5–5.1)
Sodium: 140 mmol/L (ref 135–145)
Total Bilirubin: 0.4 mg/dL (ref 0.3–1.2)
Total Protein: 6.3 g/dL — ABNORMAL LOW (ref 6.5–8.1)

## 2022-04-17 LAB — VITAMIN D 25 HYDROXY (VIT D DEFICIENCY, FRACTURES): Vit D, 25-Hydroxy: 78 ng/mL (ref 30–100)

## 2022-04-17 LAB — PREPARE RBC (CROSSMATCH)

## 2022-04-17 LAB — LIPID PANEL
Cholesterol: 120 mg/dL (ref <200)
HDL: 45 mg/dL — ABNORMAL LOW (ref >=50)
LDL Cholesterol (Calc): 56 mg/dL (calc)
Non-HDL Cholesterol (Calc): 75 mg/dL (calc) (ref <130)
Total CHOL/HDL Ratio: 2.7 (calc) (ref <5.0)
Triglycerides: 108 mg/dL (ref <150)

## 2022-04-17 LAB — IRON,TIBC AND FERRITIN PANEL
%SAT: 7 % (calc) — ABNORMAL LOW (ref 16–45)
Ferritin: 3 ng/mL — ABNORMAL LOW (ref 16–288)
Iron: 27 ug/dL — ABNORMAL LOW (ref 45–160)
TIBC: 406 mcg/dL (calc) (ref 250–450)

## 2022-04-17 LAB — RETICULOCYTES
Immature Retic Fract: 8.2 % (ref 2.3–15.9)
RBC.: 2.9 MIL/uL — ABNORMAL LOW (ref 3.87–5.11)
Retic Count, Absolute: 11.7 10*3/uL — ABNORMAL LOW (ref 19.0–186.0)
Retic Ct Pct: 2 % (ref 0.4–3.1)

## 2022-04-17 LAB — HEMOGLOBIN A1C
Hgb A1c MFr Bld: 5.5 % of total Hgb (ref <5.7)
Mean Plasma Glucose: 111 mg/dL
eAG (mmol/L): 6.2 mmol/L

## 2022-04-17 LAB — POC OCCULT BLOOD, ED: Fecal Occult Bld: NEGATIVE

## 2022-04-17 LAB — FOLATE: Folate: 25.2 ng/mL (ref >5.9)

## 2022-04-17 LAB — VITAMIN B12: Vitamin B-12: 1272 pg/mL — ABNORMAL HIGH (ref 180–914)

## 2022-04-17 LAB — FERRITIN: Ferritin: 3 ng/mL — ABNORMAL LOW (ref 11–307)

## 2022-04-17 MED ORDER — SODIUM CHLORIDE 0.9% IV SOLUTION: Freq: Once | INTRAVENOUS | Status: AC

## 2022-04-17 MED ORDER — FERROUS SULFATE 325 (65 FE) MG PO TABS: 325.0000 mg | ORAL_TABLET | Freq: Two times a day (BID) | ORAL | Status: DC

## 2022-04-17 MED ORDER — METOPROLOL TARTRATE 25 MG PO TABS
25.0000 mg | ORAL_TABLET | Freq: Every day | ORAL | Status: DC
  Administered 2022-04-17: 25 mg via ORAL
  Filled 2022-04-17: qty 1

## 2022-04-17 MED ORDER — DIAZEPAM 5 MG PO TABS: 5.0000 mg | ORAL_TABLET | Freq: Two times a day (BID) | ORAL | Status: DC | PRN

## 2022-04-17 MED ORDER — SODIUM CHLORIDE 0.9 % IV SOLN: INTRAVENOUS | Status: DC

## 2022-04-17 MED ORDER — ACETAMINOPHEN 650 MG RE SUPP: 650.0000 mg | Freq: Four times a day (QID) | RECTAL | Status: DC | PRN

## 2022-04-17 MED ORDER — ACETAMINOPHEN 325 MG PO TABS: 650.0000 mg | ORAL_TABLET | Freq: Four times a day (QID) | ORAL | Status: DC | PRN

## 2022-04-17 MED ORDER — ONDANSETRON HCL 4 MG/2ML IJ SOLN: 4.0000 mg | Freq: Four times a day (QID) | INTRAMUSCULAR | Status: DC | PRN

## 2022-04-17 MED ORDER — DOCUSATE SODIUM 100 MG PO CAPS
100.0000 mg | ORAL_CAPSULE | Freq: Two times a day (BID) | ORAL | Status: DC
  Filled 2022-04-17 (×2): qty 1

## 2022-04-17 MED ORDER — PANTOPRAZOLE SODIUM 40 MG IV SOLR
40.0000 mg | Freq: Two times a day (BID) | INTRAVENOUS | Status: DC
  Administered 2022-04-17 – 2022-04-18 (×2): 40 mg via INTRAVENOUS
  Filled 2022-04-17 (×2): qty 10

## 2022-04-17 MED ORDER — ONDANSETRON HCL 4 MG PO TABS: 4.0000 mg | ORAL_TABLET | Freq: Four times a day (QID) | ORAL | Status: DC | PRN

## 2022-04-17 MED ORDER — SENNA 8.6 MG PO TABS
1.0000 | ORAL_TABLET | Freq: Two times a day (BID) | ORAL | Status: DC
  Filled 2022-04-17 (×2): qty 1

## 2022-04-17 MED ORDER — GABAPENTIN 300 MG PO CAPS
600.0000 mg | ORAL_CAPSULE | Freq: Every day | ORAL | Status: DC
  Administered 2022-04-17: 600 mg via ORAL
  Filled 2022-04-17: qty 2

## 2022-04-17 NOTE — ED Provider Notes (Signed)
[facility] Provider Note   CSN: [id number] Arrival date & time: 04/17/22  1433     History  Chief Complaint  Patient presents with   abnormal lab    Lori [name] is a 82 y.o. female.  HPI    82 year old female comes into the emergency room with chief complaint of abnormal lab.  Patient states that she had seen her PCP yesterday for routine visit.  Her hemoglobin came back significantly low, she was advised to come to the ER immediately for further assessment.  She has history of PE for which she is on Eliquis.  She states that she has chronic weakness but over the last month her weakness is worse and she feels like laying down most of the day.  She also has dizziness, but it is not new.  She denies any chest pain, near fainting and denies any bloody stools.   Home Medications Prior to Admission medications   Medication Sig Start Date End Date Taking? Authorizing Provider  acetaminophen (TYLENOL) 500 MG tablet Take 500-1,000 mg by mouth every 6 (six) hours as needed for mild pain or moderate pain.   Yes [provider]  apixaban (ELIQUIS) 5 MG TABS tablet Take  1 tablet  2 x /day  to Prevent Blood Clots / Patient knows to take by mouth Patient taking differently: Take 5 mg by mouth in the morning and at bedtime. 06/03/21  Yes Unk Pinto, MD  Cholecalciferol (VITAMIN D3) 1000 units CAPS Take 1,000-4,000 Units by mouth daily.   Yes [provider]  diazepam (VALIUM) 5 MG tablet TAKE 1/2-1 TABLET BY MOUTH 2 TO 3 TIMES DAILY FOR DIZZINESS/VERTIGO Patient taking differently: Take 5-10 mg by mouth See admin instructions. TAKE 5-10 MG BY MOUTH 2 TIMES A DAY AS NEEDED FOR DIZZINESS/VERTIGO 01/15/22  Yes Alycia Rossetti, NP  ferrous sulfate 325 (65 FE) MG tablet Take 325 mg by mouth See admin instructions. Take 325 mg by mouth three times a week with food   Yes [provider]  gabapentin (NEURONTIN) 300 MG capsule Take   1 capsule  3 x /day for Nerve Pain                                                  /                    TAKE                           BY                     MOUTH Patient taking differently: Take 600 mg by mouth at bedtime. 07/04/21  Yes Unk Pinto, MD  metoprolol tartrate (LOPRESSOR) 25 MG tablet Take  1 tablet  Daily  for BP & Heart Patient taking differently: Take 25 mg by mouth at bedtime. 07/04/21  Yes Unk Pinto, MD  pantoprazole (PROTONIX) 40 MG tablet Take  1 tablet  Daily  to Prevent Heartburn & Indigestion Patient taking differently: Take 40 mg by mouth daily before breakfast. 07/04/21  Yes Unk Pinto, MD      Allergies    Patient has no known allergies.    Review of Systems   Review  of Systems  All other systems reviewed and are negative.   Physical Exam Updated Vital Signs BP 109/79 (BP Location: Left Arm)   Pulse 72   Temp 97.8 F (36.6 C) (Oral)   Resp 18   SpO2 99%  Physical Exam Vitals and nursing note reviewed.  Constitutional:      Appearance: She is well-developed.  HENT:     Head: Atraumatic.  Eyes:     Extraocular Movements: Extraocular movements intact.     Pupils: Pupils are equal, round, and reactive to light.  Cardiovascular:     Rate and Rhythm: Normal rate.  Pulmonary:     Effort: Pulmonary effort is normal.  Abdominal:     Comments: Digital rectal exam did not reveal any melena  Musculoskeletal:     Cervical back: Normal range of motion and neck supple.  Skin:    General: Skin is warm and dry.  Neurological:     Mental Status: She is alert and oriented to person, place, and time.     ED Results / Procedures / Treatments   Labs (all labs ordered are listed, but only abnormal results are displayed) Labs Reviewed  COMPREHENSIVE METABOLIC PANEL - Abnormal; Notable for the following components:      Result Value   Glucose, Bld 104 (*)    Creatinine, Ser 1.09 (*)    Calcium 8.8 (*)    Total Protein 6.3 (*)    GFR,  Estimated 52 (*)    All other components within normal limits  CBC WITH DIFFERENTIAL/PLATELET - Abnormal; Notable for the following components:   RBC 2.96 (*)    Hemoglobin 7.3 (*)    HCT 25.7 (*)    MCH 24.7 (*)    MCHC 28.4 (*)    RDW 15.6 (*)    All other components within normal limits  COMPREHENSIVE METABOLIC PANEL  CBC  MAGNESIUM  PHOSPHORUS  VITAMIN B12  FOLATE  IRON AND TIBC  FERRITIN  RETICULOCYTES  POC OCCULT BLOOD, ED  TYPE AND SCREEN  PREPARE RBC (CROSSMATCH)    EKG None  Radiology No results found.  Procedures .Critical Care  Performed by: Varney Biles, MD Authorized by: Varney Biles, MD   Critical care provider statement:    Critical care time (minutes):  36   Critical care was necessary to treat or prevent imminent or life-threatening deterioration of the following conditions:  Circulatory failure   Critical care was time spent personally by me on the following activities:  Development of treatment plan with patient or surrogate, discussions with consultants, evaluation of patient's response to treatment, examination of patient, ordering and review of laboratory studies, ordering and review of radiographic studies, ordering and performing treatments and interventions, pulse oximetry, re-evaluation of patient's condition, review of old charts and obtaining history from patient or surrogate     Medications Ordered in ED Medications  metoprolol tartrate (LOPRESSOR) tablet 25 mg (has no administration in time range)  diazepam (VALIUM) tablet 5-10 mg (has no administration in time range)  gabapentin (NEURONTIN) capsule 600 mg (has no administration in time range)  ferrous sulfate tablet 325 mg (has no administration in time range)  pantoprazole (PROTONIX) injection 40 mg (has no administration in time range)  0.9 %  sodium chloride infusion ( Intravenous New Bag/Given 04/17/22 1759)  acetaminophen (TYLENOL) tablet 650 mg (has no administration in time  range)    Or  acetaminophen (TYLENOL) suppository 650 mg (has no administration in time range)  docusate sodium (COLACE)  capsule 100 mg (has no administration in time range)  senna (SENOKOT) tablet 8.6 mg (has no administration in time range)  ondansetron (ZOFRAN) tablet 4 mg (has no administration in time range)    Or  ondansetron (ZOFRAN) injection 4 mg (has no administration in time range)  0.9 %  sodium chloride infusion (Manually program via Guardrails IV Fluids) (has no administration in time range)    ED Course/ Medical Decision Making/ A&P                             Medical Decision Making This patient presents to the ED with chief complaint(s) of abnormal labs, low hemoglobin with pertinent past medical history of PE on Eliquis, previous history of transfusion requirement.The complaint involves an extensive differential diagnosis and also carries with it a high risk of complications and morbidity.    The differential diagnosis includes: Anemia of chronic disease, chronic blood loss anemia, iron deficiency anemia. No melena noted.  The initial plan is to admit patient for anemia workup and transfusion.  It appears that she is at baseline frail and has dizziness and weakness, but she does indicate that over the last month or so her weakness is worse.  Likely symptomatic from this anemia.  Hemoglobin at baseline is over 11.  Additional history obtained: Records reviewed previous endoscopes  Independent labs interpretation:  The following labs were independently interpreted: Hb is < 8.   Treatment and Reassessment: We will give 1 unit of prbc  Consultation: - Consulted or discussed management/test interpretation with external professional: GI team - they will see the patient if medicine is requests.  Patient consented to 1 unit of blood.  Ordering 1 unit of blood for symptomatic anemia at the request of hospitalist staff.    Amount and/or Complexity of Data  Reviewed Labs: ordered.  Risk Prescription drug management. Decision regarding hospitalization.     Final Clinical Impression(s) / ED Diagnoses Final diagnoses:  Symptomatic anemia    Rx / DC Orders ED Discharge Orders     None         Varney Biles, MD 04/17/22 1807

## 2022-04-17 NOTE — ED Triage Notes (Signed)
Pt seen at pcp for annual check up yesterday and sent to ER for hemoglobin of 7. Hx of IDA and on eliquis.  Hx of GI bleed with transfusions.  Denies rectal bleeding, cp, sob.

## 2022-04-17 NOTE — Progress Notes (Signed)
MEDICARE ANNUAL WELLNESS VISIT AND 3 MONTH FOLLOW UP  Assessment:   Encounter for Medicare annual wellness exam Due annually Health maintenance reviewed  Essential hypertension Controlled  Continue Metoprolol Continue medications;  Discussed DASH (Dietary Approaches to Stop Hypertension) DASH diet is lower in sodium than a typical American diet. Cut back on foods that are high in saturated fat, cholesterol, and trans fats. Eat more whole-grain foods, fish, poultry, and nuts Remain active and exercise as tolerated daily.  Monitor BP at home-Call if greater than 130/80.    Chronic atrial fibrillation ([name])/ thrombophilia ([place]) Follows with Cardiology, Dr. Einar Gip. Rate controlled, on NOAC for chadsvasc of 4 Continue anticoagulant Apixaban, Metoprolol  History of PE Continue Eliquis. 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 Recommended diet heavy in fruits and veggies, omega 3's. Decrease consumption of animal meats, cheeses, and dairy products. Remain active and exercise as tolerated. Continue to monitor.   Iron deficiency due to chronic blood loss  Follows Dr. Lindi Adie, Oncology Monitor CBC, iron panel; anticipate chronic iron replacement needs secondary to esophagitis and cameron's lesion  Abnormal glucose Education: Reviewed 'ABCs' of diabetes management  A1C (<7) Blood pressure (<130/80) Cholesterol (LDL <70) Continue Eye Exam yearly  Continue Dental Exam Q6 mo Discussed dietary recommendations Discussed Physical Activity recommendations Monitor A1C  Vitamin D deficiency Continue supplement Check Vitamin D levels   Overweight  Discussed appropriate BMI Diet modification. Physical activity. Encouraged/praised to build confidence.   Senile purpura ([name]) R/t eliquis and age; Discussed process, protect  skin, sunscreen  Varicose veins Discussed weight loss discussed, continue compression stockings and elevation  GERD/cameron lesions Continue Pantoprazole. No suspected reflux complications ([place]/stricture). Lifestyle modification:  wt loss, avoid meals 2-3h before bedtime. Consider eliminating food triggers:  chocolate, caffeine, EtOH, acid/spicy food.   Medication Management All medications discussed and reviewed in full. All questions and concerns regarding medications addressed.      Over 40 minutes of face to face interview, exam, counseling, chart review and critical decision making was performed Future Appointments  Date Time Provider [place]  07/09/2022 10:00 AM Unk Pinto, MD [facility] None  04/19/2023 11:00 AM Darrol Jump, NP [facility] None     Plan:   During the course of the visit the patient was educated and counseled about appropriate screening and preventive services including:   Pneumococcal vaccine  Prevnar 13 Influenza vaccine Td vaccine Screening electrocardiogram Bone densitometry screening Colorectal cancer screening Diabetes screening Glaucoma screening Nutrition counseling  Advanced directives: requested   Subjective:  Lori [name] is a 82 y.o. female who presents for Medicare Annual Wellness Visit and 3 month follow up  She reports over all she is doing well.  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  She s/p TRH 10/17/19 and reports she is doing very well, she completed PT.  She ambulates with a cane.  Reports she is having some sciatic pains now on left side and working to improve this with Ortho, Dr Percell Miller.  Patient was hospitalized in Nov 2019  with a severe GI bleed (hgb 5 gm%) requiring  pRBC transfusions and found to have esophagitis, erosive gastropathy, Cameron lesion and a small colon polyp & was initiated on PPI. CBCs and iron levels have since resolved to normal range, continues on  daily iron.   Saw Hematology on 10/30/21 for tmt  of IDA then followed up again on 01/2022 for IV iron infusions.  She is to continue to consult with Dr. Collene Mares with gastroenterology for upper endoscopy and colonoscopy  She takes CBD oil PRN anxiety and LE pain, uses valium rarelly, tylenol as well for pain with aleve occasionally and reports she is managing well for vertigo and anxiety.    BMI is Body mass index is 30.29 kg/m., she has not been working on diet and exercise.   Wt Readings from Last 3 Encounters:  04/16/22 182 lb (82.6 kg)  01/14/22 190 lb 12.8 oz (86.5 kg)  11/06/21 176 lb 9.6 oz (80.1 kg)   Her blood pressure has been controlled at home, today their BP is BP: 118/76  She does not workout but is very active with 35 dogs and goats. She denies chest pain, shortness of breath, dizziness. In 2009, she had a false (+) Myoview confirmed by a Negative Heart Cath. Last Echo 09/13/19, EF 69%  She has history of Afib, CHA2DS2VASC 4, declined anticoag/NOAC for many years until recent PE and now doing well on eliquis.  She follows with Cardiology.   She is not on cholesterol medication and denies myalgias. Her LDL cholesterol is at goal. The cholesterol last visit was:   Lab Results  Component Value Date   CHOL 165 01/14/2022   HDL 58 01/14/2022   LDLCALC 87 01/14/2022   TRIG 100 01/14/2022   CHOLHDL 2.8 01/14/2022    She has been working on diet and exercise for prediabetes, and denies polydipsia, polyuria and visual disturbances. Last A1C in the office was:  Lab Results  Component Value Date   HGBA1C 5.3 01/14/2022   Recently had episode of AKI that did improve with cessation of diuretic/NSAID and advised increased water, admits 2-3 bottles of water, 2 bottles of pepsi  Lab Results  Component Value Date   GFRNONAA 61 07/02/2020   GFRNONAA 52 (L) 04/03/2020   GFRNONAA 43 (L) 01/02/2020   Lab Results  Component Value Date   CREATININE 0.82 01/14/2022   CREATININE 0.99  10/14/2021   CREATININE 1.02 (H) 07/04/2021   Patient is on Vitamin D supplement.   Lab Results  Component Value Date   VD25OH 57 01/14/2022      Blood pressure 118/76, pulse (!) 55, temperature (!) 97.5 F (36.4 C), height '5\' 5"'$  (1.651 m), weight 182 lb (82.6 kg), SpO2 99 %.  Medications   Current Outpatient Medications (Cardiovascular):    metoprolol tartrate (LOPRESSOR) 25 MG tablet, Take  1 tablet  Daily  for BP & Heart   triamterene-hydrochlorothiazide (MAXZIDE-25) 37.5-25 MG tablet, Take 1 tablet by mouth as needed. (Patient not taking: Reported on 04/16/2022)  Current Outpatient Medications (Respiratory):    umeclidinium-vilanterol (ANORO ELLIPTA) 62.5-25 MCG/ACT AEPB, Inhale 1 puff into the lungs daily.  Current Outpatient Medications (Analgesics):    acetaminophen (TYLENOL) 500 MG tablet, Take 1,000 mg by mouth every 6 (six) hours as needed for mild pain or moderate pain.  Current Outpatient Medications (Hematological):    apixaban (ELIQUIS) 5 MG TABS tablet, Take  1 tablet  2 x /day  to Prevent Blood Clots / Patient knows to take by mouth   ferrous sulfate 325 (65 FE) MG tablet, Take 325 mg by mouth daily with breakfast.  Current Outpatient Medications (Other):    Cholecalciferol 25 MCG (1000 UT) capsule, Take 1,000 Units by mouth daily.    [employer] MAGNESIUM PO, Take by mouth. Brand is Maganese   diazepam (VALIUM)  5 MG tablet, TAKE 1/2-1 TABLET BY MOUTH 2 TO 3 TIMES DAILY FOR DIZZINESS/VERTIGO   gabapentin (NEURONTIN) 300 MG capsule, Take  1 capsule  3 x /day for Nerve Pain                                                  /                    TAKE                           BY                     MOUTH   pantoprazole (PROTONIX) 40 MG tablet, Take  1 tablet  Daily  to Prevent Heartburn & Indigestion   No Known Allergies  Current Problems (verified) Patient Active Problem List   Diagnosis Date Noted   Thrombophilia ([place]) 04/20/2019   Spinal stenosis of lumbar region  04/20/2019   Iron deficiency anemia due to chronic blood loss 03/08/2019   Varicose vein of leg 09/01/2018   Gastroesophageal reflux disease with esophagitis    History of pulmonary embolism (01/2018) 02/04/2018   Senile purpura ([place]) 07/20/2017   Atrial fibrillation ([place]) 06/16/2017   Vertigo due to brain injury ([place]) 02/06/2015   Obesity (BMI 30.0-34.9) 02/06/2015   Medication management 11/18/2013   Hyperlipidemia, mixed    Vitamin D deficiency    Essential hypertension    Screening Tests Immunization History  Administered Date(s) Administered   DT (Pediatric) 01/24/2014   Pneumococcal Conjugate-13 05/26/2018   Pneumococcal Polysaccharide-23 10/06/2011   Td 04/23/2003   Preventative care: Last colonoscopy: 01/2018 no further follow up secondary to age per Dr. Fuller Plan Last EGD: 01/2018 - esophagitis, cameron lesions, anticipate chronic iron loss Last mammogram: 09/2012 - she states if she did have cancer she would not want treatment Last pap smear/pelvic exam: 2009 DEXA: 2011 normal, declines further  Prior vaccinations: TD  2015  Influenza: declines Pneumococcal: 2013 Prevnar13: 2020 Shingles/Zostavax: declines Covid 19: declines   Hep C: screening done today with patient permission  Names of Other Physician/Practitioners you currently use: 1. [facility] here for primary care 2. [place], eye doctor, 2023 3. Needs one, dentist, remote, encouraged to schedule   Patient Care Team: Unk Pinto, MD as PCP - General (Internal Medicine) Unk Pinto, MD as PCP - Internal Medicine (Internal Medicine) Clent Jacks, MD as Consulting Physician (Ophthalmology) Ladene Artist, MD as Consulting Physician (Gastroenterology) Newton Pigg, [facility] (Inactive) as Pharmacist (Pharmacist)  SURGICAL HISTORY She  has a past surgical history that includes Tubal ligation; Cerebral aneurysm repair; Cataract extraction w/ intraocular lens   implant, bilateral (2009); Foot surgery (1993); Colonoscopy (N/A, 02/05/2018); Esophagogastroduodenoscopy (N/A, 02/05/2018); biopsy (02/05/2018); polypectomy (02/05/2018); and Total hip arthroplasty (Right, 10/17/2019). FAMILY HISTORY Her family history includes Cancer in her father and mother; Diabetes in her mother; Hypertension in her mother. SOCIAL HISTORY She  reports that she quit smoking about 31 years ago. Her smoking use included cigarettes. She has never used smokeless tobacco. She reports that she does not drink alcohol and does not use drugs.   MEDICARE WELLNESS OBJECTIVES: Physical activity: Current Exercise Habits: Home exercise routine, Exercise limited by: orthopedic condition(s);cardiac condition(s) Cardiac risk factors:  Depression/mood screen:      04/16/2022   11:48 AM  Depression screen PHQ 2/9  Decreased Interest 0  Down, Depressed, Hopeless 0  PHQ - 2 Score 0    ADLs:     04/16/2022   11:44 AM 01/17/2022   11:53 PM  In your present state of health, do you have any difficulty performing the following activities:  Hearing? 1 0  Vision? 0 0  Difficulty concentrating or making decisions? 0 0  Walking or climbing stairs? 0 0  Dressing or bathing? 0 0  Doing errands, shopping? 0 0  Preparing Food and eating ? N   Using the Toilet? N   In the past six months, have you accidently leaked urine? N   Do you have problems with loss of bowel control? N   Managing your Medications? N   Managing your Finances? N   Housekeeping or managing your Housekeeping? N      Cognitive Testing  Alert? Yes  Normal Appearance?Yes  Oriented to person? Yes  Place? Yes   Time? Yes  Recall of three objects?  Yes  Can perform simple calculations? Yes  Displays appropriate judgment?Yes  Can read the correct time from a watch face?Yes  EOL planning:    Review of Systems  Constitutional: Negative.  Negative for malaise/fatigue and weight loss.  HENT: Negative.  Negative for hearing  loss and tinnitus.   Eyes: Negative.  Negative for blurred vision and double vision.  Respiratory: Negative.  Negative for cough, sputum production, shortness of breath and wheezing.   Cardiovascular: Negative.  Negative for chest pain, palpitations, orthopnea, claudication, leg swelling and PND.  Gastrointestinal: Negative.  Negative for abdominal pain, blood in stool, constipation, diarrhea, heartburn, melena, nausea and vomiting.  Genitourinary: Negative.   Musculoskeletal:  Positive for joint pain (Left hip). Negative for falls and myalgias.  Skin: Negative.  Negative for rash.  Neurological:  Negative for dizziness, tingling, sensory change, weakness and headaches.  Endo/Heme/Allergies:  Negative for polydipsia.  Psychiatric/Behavioral: Negative.  Negative for depression, memory loss, substance abuse and suicidal ideas. The patient is not nervous/anxious and does not have insomnia.   All other systems reviewed and are negative.    Objective:     Today's Vitals   04/16/22 1108  BP: 118/76  Pulse: (!) 55  Temp: (!) 97.5 F (36.4 C)  SpO2: 99%  Weight: 182 lb (82.6 kg)  Height: '5\' 5"'$  (1.651 m)   Body mass index is 30.29 kg/m.  General appearance: alert, no distress, WD/WN, female HEENT: normocephalic, sclerae anicteric, TMs pearly, nares patent, no discharge or erythema, pharynx normal Oral cavity: MMM, no lesions Neck: supple, no lymphadenopathy, no thyromegaly, no masses Heart: RRR, normal S1, S2, no murmurs Lungs: CTA bilaterally, no wheezes, rhonchi, or rales Abdomen: +bs, soft, non tender, non distended, no masses, no hepatomegaly, no splenomegaly Musculoskeletal: nontender, no swelling, very slow sitting to standing, slow antalgic gait, bil wrists with bony enlargement without effusion Extremities: no edema, no cyanosis, no clubbing, numerous superficial varicose veins of bilateral lower legs Pulses: 2+ symmetric, upper and lower extremities, normal cap  refill Neurological: alert, oriented x 3, CN2-12 intact, strength normal upper extremities and lower extremities, sensation normal throughout, DTRs 2+ throughout, no cerebellar signs, gait antalgic Psychiatric: normal affect, behavior normal, pleasant   Medicare Attestation I have personally reviewed: The patient's medical and social history Their use of alcohol, tobacco or illicit drugs Their current medications and supplements The patient's functional ability including ADLs,fall  risks, home safety risks, cognitive, and hearing and visual impairment Diet and physical activities Evidence for depression or mood disorders  The patient's weight, height, BMI, and visual acuity have been recorded in the chart.  I have made referrals, counseling, and provided education to the patient based on review of the above and I have provided the patient with a written personalized care plan for preventive services.     Darrol Jump, NP   04/03/2020

## 2022-04-17 NOTE — ED Provider Triage Note (Signed)
Emergency Medicine Provider Triage Evaluation Note  Lori [name] , a 82 y.o. female  was evaluated in triage.  Pt complains of low hemoglobin.  Was seen yesterday by PCP and hemoglobin was found to be 7.  Advised to come to the ED for further evaluation.  Patient has history of A-fib and is on Eliquis.  Also history of iron deficiency anemia.  Patient denies any bleeding.  Denies shortness of breath fatigue and lightheadedness.  Does endorse dizziness this been going on for 2 months.  Review of Systems  Positive: See above Negative: See above  Physical Exam  BP (!) 146/72   Pulse 64   Temp 98 F (36.7 C)   Resp 20   SpO2 100%  Gen:   Awake, no distress   Resp:  Normal effort  MSK:   Moves extremities without difficulty  Other:    Medical Decision Making  Medically screening exam initiated at 3:05 PM.  Appropriate orders placed.  [name] was informed that the remainder of the evaluation will be completed by another provider, this initial triage assessment does not replace that evaluation, and the importance of remaining in the ED until their evaluation is complete.  Work up started   Harriet Pho, PA-C 04/17/22 1507

## 2022-04-17 NOTE — Progress Notes (Deleted)
MEDICARE ANNUAL WELLNESS VISIT AND 3 MONTH FOLLOW UP  Assessment:   Encounter for Medicare annual wellness exam Due annually Health maintenance reviewed  Essential hypertension Controlled  Continue Metoprolol Continue medications;  Discussed DASH (Dietary Approaches to Stop Hypertension) DASH diet is lower in sodium than a typical American diet. Cut back on foods that are high in saturated fat, cholesterol, and trans fats. Eat more whole-grain foods, fish, poultry, and nuts Remain active and exercise as tolerated daily.  Monitor BP at home-Call if greater than 130/80.    Chronic atrial fibrillation ([name])/ thrombophilia ([place]) Follows with Cardiology, Dr. Einar Gip. Rate controlled, on NOAC for chadsvasc of 4 Continue anticoagulant Apixaban, Metoprolol  History of PE Continue Eliquis. 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 Recommended diet heavy in fruits and veggies, omega 3's. Decrease consumption of animal meats, cheeses, and dairy products. Remain active and exercise as tolerated. Continue to monitor.   Iron deficiency due to chronic blood loss  Follows Dr. Lindi Adie, Oncology Monitor CBC, iron panel; anticipate chronic iron replacement needs secondary to esophagitis and cameron's lesion  Abnormal glucose Education: Reviewed 'ABCs' of diabetes management  A1C (<7) Blood pressure (<130/80) Cholesterol (LDL <70) Continue Eye Exam yearly  Continue Dental Exam Q6 mo Discussed dietary recommendations Discussed Physical Activity recommendations Monitor A1C  Vitamin D deficiency Continue supplement Check Vitamin D levels   Overweight  Discussed appropriate BMI Diet modification. Physical activity. Encouraged/praised to build confidence.   Senile purpura ([name]) R/t eliquis and age; Discussed process, protect  skin, sunscreen  Varicose veins Discussed weight loss discussed, continue compression stockings and elevation  GERD/cameron lesions Continue Pantoprazole. No suspected reflux complications ([place]/stricture). Lifestyle modification:  wt loss, avoid meals 2-3h before bedtime. Consider eliminating food triggers:  chocolate, caffeine, EtOH, acid/spicy food.   Medication Management All medications discussed and reviewed in full. All questions and concerns regarding medications addressed.      Over 40 minutes of face to face interview, exam, counseling, chart review and critical decision making was performed Future Appointments  Date Time Provider [place]  07/09/2022 10:00 AM Unk Pinto, MD [facility] None  04/19/2023 11:00 AM Darrol Jump, NP [facility] None     Plan:   During the course of the visit the patient was educated and counseled about appropriate screening and preventive services including:   Pneumococcal vaccine  Prevnar 13 Influenza vaccine Td vaccine Screening electrocardiogram Bone densitometry screening Colorectal cancer screening Diabetes screening Glaucoma screening Nutrition counseling  Advanced directives: requested   Subjective:  Lori [name] is a 82 y.o. female who presents for Medicare Annual Wellness Visit and 3 month follow up  She reports over all she is doing well.  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  She s/p TRH 10/17/19 and reports she is doing very well, she completed PT.  She ambulates with a cane.  Reports she is having some sciatic pains now on left side and working to improve this with Ortho, Dr Percell Miller.  Patient was hospitalized in Nov 2019  with a severe GI bleed (hgb 5 gm%) requiring  pRBC transfusions and found to have esophagitis, erosive gastropathy, Cameron lesion and a small colon polyp & was initiated on PPI. CBCs and iron levels have since resolved to normal range, continues on  daily iron.   Saw Hematology on 10/30/21 for tmt  of IDA then followed up again on 01/2022 for IV iron infusions.  She is to continue to consult with Dr. Collene Mares with gastroenterology for upper endoscopy and colonoscopy  She takes CBD oil PRN anxiety and LE pain, uses valium rarelly, tylenol as well for pain with aleve occasionally and reports she is managing well for vertigo and anxiety.    BMI is Body mass index is 30.29 kg/m., she has not been working on diet and exercise.   Wt Readings from Last 3 Encounters:  04/16/22 182 lb (82.6 kg)  01/14/22 190 lb 12.8 oz (86.5 kg)  11/06/21 176 lb 9.6 oz (80.1 kg)   Her blood pressure has been controlled at home, today their BP is BP: 118/76  She does not workout but is very active with 35 dogs and goats. She denies chest pain, shortness of breath, dizziness. In 2009, she had a false (+) Myoview confirmed by a Negative Heart Cath. Last Echo 09/13/19, EF 69%  She has history of Afib, CHA2DS2VASC 4, declined anticoag/NOAC for many years until recent PE and now doing well on eliquis.  She follows with Cardiology.   She is not on cholesterol medication and denies myalgias. Her LDL cholesterol is at goal. The cholesterol last visit was:   Lab Results  Component Value Date   CHOL 165 01/14/2022   HDL 58 01/14/2022   LDLCALC 87 01/14/2022   TRIG 100 01/14/2022   CHOLHDL 2.8 01/14/2022    She has been working on diet and exercise for prediabetes, and denies polydipsia, polyuria and visual disturbances. Last A1C in the office was:  Lab Results  Component Value Date   HGBA1C 5.3 01/14/2022   Recently had episode of AKI that did improve with cessation of diuretic/NSAID and advised increased water, admits 2-3 bottles of water, 2 bottles of pepsi  Lab Results  Component Value Date   GFRNONAA 61 07/02/2020   GFRNONAA 52 (L) 04/03/2020   GFRNONAA 43 (L) 01/02/2020   Lab Results  Component Value Date   CREATININE 0.82 01/14/2022   CREATININE 0.99  10/14/2021   CREATININE 1.02 (H) 07/04/2021   Patient is on Vitamin D supplement.   Lab Results  Component Value Date   VD25OH 57 01/14/2022      Blood pressure 118/76, pulse (!) 55, temperature (!) 97.5 F (36.4 C), height '5\' 5"'$  (1.651 m), weight 182 lb (82.6 kg), SpO2 99 %.  Medications   Current Outpatient Medications (Cardiovascular):    metoprolol tartrate (LOPRESSOR) 25 MG tablet, Take  1 tablet  Daily  for BP & Heart   triamterene-hydrochlorothiazide (MAXZIDE-25) 37.5-25 MG tablet, Take 1 tablet by mouth as needed. (Patient not taking: Reported on 04/16/2022)  Current Outpatient Medications (Respiratory):    umeclidinium-vilanterol (ANORO ELLIPTA) 62.5-25 MCG/ACT AEPB, Inhale 1 puff into the lungs daily.  Current Outpatient Medications (Analgesics):    acetaminophen (TYLENOL) 500 MG tablet, Take 1,000 mg by mouth every 6 (six) hours as needed for mild pain or moderate pain.  Current Outpatient Medications (Hematological):    apixaban (ELIQUIS) 5 MG TABS tablet, Take  1 tablet  2 x /day  to Prevent Blood Clots / Patient knows to take by mouth   ferrous sulfate 325 (65 FE) MG tablet, Take 325 mg by mouth daily with breakfast.  Current Outpatient Medications (Other):    Cholecalciferol 25 MCG (1000 UT) capsule, Take 1,000 Units by mouth daily.    [employer] MAGNESIUM PO, Take by mouth. Brand is Maganese   diazepam (VALIUM)  5 MG tablet, TAKE 1/2-1 TABLET BY MOUTH 2 TO 3 TIMES DAILY FOR DIZZINESS/VERTIGO   gabapentin (NEURONTIN) 300 MG capsule, Take  1 capsule  3 x /day for Nerve Pain                                                  /                    TAKE                           BY                     MOUTH   pantoprazole (PROTONIX) 40 MG tablet, Take  1 tablet  Daily  to Prevent Heartburn & Indigestion   No Known Allergies  Current Problems (verified) Patient Active Problem List   Diagnosis Date Noted   Thrombophilia ([place]) 04/20/2019   Spinal stenosis of lumbar region  04/20/2019   Iron deficiency anemia due to chronic blood loss 03/08/2019   Varicose vein of leg 09/01/2018   Gastroesophageal reflux disease with esophagitis    History of pulmonary embolism (01/2018) 02/04/2018   Senile purpura ([place]) 07/20/2017   Atrial fibrillation ([place]) 06/16/2017   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 Fuller Plan Last EGD: 01/2018 - esophagitis, cameron lesions, anticipate chronic iron loss Last mammogram: 09/2012 - she states if she did have cancer she would not want treatment Last pap smear/pelvic exam: 2009 DEXA: 2011 normal, declines further  Prior vaccinations: TD  2015  Influenza: declines Pneumococcal: 2013 Prevnar13: 2020 Shingles/Zostavax: declines Covid 19: declines   Hep C: screening done today with patient permission  Names of Other Physician/Practitioners you currently use: 1. [facility] here for primary care 2. [place], eye doctor, 2023 3. Needs one, dentist, remote, encouraged to schedule   Patient Care Team: Unk Pinto, MD as PCP - General (Internal Medicine) Unk Pinto, MD as PCP - Internal Medicine (Internal Medicine) Clent Jacks, MD as Consulting Physician (Ophthalmology) Ladene Artist, MD as Consulting Physician (Gastroenterology) Newton Pigg, [facility] (Inactive) as Pharmacist (Pharmacist)  SURGICAL HISTORY She  has a past surgical history that includes Tubal ligation; Cerebral aneurysm repair; Cataract extraction w/ intraocular lens   implant, bilateral (2009); Foot surgery (1993); Colonoscopy (N/A, 02/05/2018); Esophagogastroduodenoscopy (N/A, 02/05/2018); biopsy (02/05/2018); polypectomy (02/05/2018); and Total hip arthroplasty (Right, 10/17/2019). FAMILY HISTORY Her family history includes Cancer in her father and mother; Diabetes in her mother; Hypertension in her mother. SOCIAL HISTORY She  reports that she quit smoking about 31 years ago. Her smoking use included cigarettes. She has never used smokeless tobacco. She reports that she does not drink alcohol and does not use drugs.   MEDICARE WELLNESS OBJECTIVES: Physical activity: Current Exercise Habits: Home exercise routine, Exercise limited by: orthopedic condition(s);cardiac condition(s) Cardiac risk factors:  Depression/mood screen:      04/16/2022   11:48 AM  Depression screen PHQ 2/9  Decreased Interest 0  Down, Depressed, Hopeless 0  PHQ - 2 Score 0    ADLs:     04/16/2022   11:44 AM 01/17/2022   11:53 PM  In your present state of health, do you have any difficulty performing the following activities:  Hearing? 1 0  Vision? 0 0  Difficulty concentrating or making decisions? 0 0  Walking or climbing stairs? 0 0  Dressing or bathing? 0 0  Doing errands, shopping? 0 0  Preparing Food and eating ? N   Using the Toilet? N   In the past six months, have you accidently leaked urine? N   Do you have problems with loss of bowel control? N   Managing your Medications? N   Managing your Finances? N   Housekeeping or managing your Housekeeping? N      Cognitive Testing  Alert? Yes  Normal Appearance?Yes  Oriented to person? Yes  Place? Yes   Time? Yes  Recall of three objects?  Yes  Can perform simple calculations? Yes  Displays appropriate judgment?Yes  Can read the correct time from a watch face?Yes  EOL planning:    Review of Systems  Constitutional: Negative.  Negative for malaise/fatigue and weight loss.  HENT: Negative.  Negative for hearing  loss and tinnitus.   Eyes: Negative.  Negative for blurred vision and double vision.  Respiratory: Negative.  Negative for cough, sputum production, shortness of breath and wheezing.   Cardiovascular: Negative.  Negative for chest pain, palpitations, orthopnea, claudication, leg swelling and PND.  Gastrointestinal: Negative.  Negative for abdominal pain, blood in stool, constipation, diarrhea, heartburn, melena, nausea and vomiting.  Genitourinary: Negative.   Musculoskeletal:  Positive for joint pain (Left hip). Negative for falls and myalgias.  Skin: Negative.  Negative for rash.  Neurological:  Negative for dizziness, tingling, sensory change, weakness and headaches.  Endo/Heme/Allergies:  Negative for polydipsia.  Psychiatric/Behavioral: Negative.  Negative for depression, memory loss, substance abuse and suicidal ideas. The patient is not nervous/anxious and does not have insomnia.   All other systems reviewed and are negative.    Objective:     Today's Vitals   04/16/22 1108  BP: 118/76  Pulse: (!) 55  Temp: (!) 97.5 F (36.4 C)  SpO2: 99%  Weight: 182 lb (82.6 kg)  Height: '5\' 5"'$  (1.651 m)   Body mass index is 30.29 kg/m.  General appearance: alert, no distress, WD/WN, female HEENT: normocephalic, sclerae anicteric, TMs pearly, nares patent, no discharge or erythema, pharynx normal Oral cavity: MMM, no lesions Neck: supple, no lymphadenopathy, no thyromegaly, no masses Heart: RRR, normal S1, S2, no murmurs Lungs: CTA bilaterally, no wheezes, rhonchi, or rales Abdomen: +bs, soft, non tender, non distended, no masses, no hepatomegaly, no splenomegaly Musculoskeletal: nontender, no swelling, very slow sitting to standing, slow antalgic gait, bil wrists with bony enlargement without effusion Extremities: no edema, no cyanosis, no clubbing, numerous superficial varicose veins of bilateral lower legs Pulses: 2+ symmetric, upper and lower extremities, normal cap  refill Neurological: alert, oriented x 3, CN2-12 intact, strength normal upper extremities and lower extremities, sensation normal throughout, DTRs 2+ throughout, no cerebellar signs, gait antalgic Psychiatric: normal affect, behavior normal, pleasant   Medicare Attestation I have personally reviewed: The patient's medical and social history Their use of alcohol, tobacco or illicit drugs Their current medications and supplements The patient's functional ability including ADLs,fall  risks, home safety risks, cognitive, and hearing and visual impairment Diet and physical activities Evidence for depression or mood disorders  The patient's weight, height, BMI, and visual acuity have been recorded in the chart.  I have made referrals, counseling, and provided education to the patient based on review of the above and I have provided the patient with a written personalized care plan for preventive services.     Darrol Jump, NP   04/03/2020

## 2022-04-17 NOTE — Progress Notes (Signed)
Patient was seen in office yesterday 04/16/22 for an annual wellness check and routine labs were obtained.  Upon review of blood work, Hgb has dropped from 11 to 7 in the last 3 months, including iron level of 27 and ferritin of 3.  She has a hx of IDA.  She is currently on anticoagulation, Eliquis, and taking oral Ferrrous Sulfate.  Contacted patient and instructed her to report to ER for transfusion and updated blood work.  Patient reported understanding and states that she will be traveling to [facility].

## 2022-04-17 NOTE — Progress Notes (Deleted)
MEDICARE ANNUAL WELLNESS VISIT AND 3 MONTH FOLLOW UP  Assessment:   Encounter for Medicare annual wellness exam Due annually Health maintenance reviewed  Essential hypertension Controlled  Continue Metoprolol Continue medications;  Discussed DASH (Dietary Approaches to Stop Hypertension) DASH diet is lower in sodium than a typical American diet. Cut back on foods that are high in saturated fat, cholesterol, and trans fats. Eat more whole-grain foods, fish, poultry, and nuts Remain active and exercise as tolerated daily.  Monitor BP at home-Call if greater than 130/80.    Chronic atrial fibrillation ([name])/ thrombophilia ([place]) Follows with Cardiology, Dr. Einar Gip. Rate controlled, on NOAC for chadsvasc of 4 Continue anticoagulant Apixaban, Metoprolol  History of PE Continue Eliquis. 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 Recommended diet heavy in fruits and veggies, omega 3's. Decrease consumption of animal meats, cheeses, and dairy products. Remain active and exercise as tolerated. Continue to monitor.   Iron deficiency due to chronic blood loss  Follows Dr. Lindi Adie, Oncology Monitor CBC, iron panel; anticipate chronic iron replacement needs secondary to esophagitis and cameron's lesion  Abnormal glucose Education: Reviewed 'ABCs' of diabetes management  A1C (<7) Blood pressure (<130/80) Cholesterol (LDL <70) Continue Eye Exam yearly  Continue Dental Exam Q6 mo Discussed dietary recommendations Discussed Physical Activity recommendations Monitor A1C  Vitamin D deficiency Continue supplement Check Vitamin D levels   Overweight  Discussed appropriate BMI Diet modification. Physical activity. Encouraged/praised to build confidence.   Senile purpura ([name]) R/t eliquis and age; Discussed process, protect  skin, sunscreen  Varicose veins Discussed weight loss discussed, continue compression stockings and elevation  GERD/cameron lesions Continue Pantoprazole. No suspected reflux complications ([place]/stricture). Lifestyle modification:  wt loss, avoid meals 2-3h before bedtime. Consider eliminating food triggers:  chocolate, caffeine, EtOH, acid/spicy food.   Medication Management All medications discussed and reviewed in full. All questions and concerns regarding medications addressed.      Over 40 minutes of face to face interview, exam, counseling, chart review and critical decision making was performed Future Appointments  Date Time Provider [place]  07/09/2022 10:00 AM Unk Pinto, MD [facility] None  04/19/2023 11:00 AM Darrol Jump, NP [facility] None     Plan:   During the course of the visit the patient was educated and counseled about appropriate screening and preventive services including:   Pneumococcal vaccine  Prevnar 13 Influenza vaccine Td vaccine Screening electrocardiogram Bone densitometry screening Colorectal cancer screening Diabetes screening Glaucoma screening Nutrition counseling  Advanced directives: requested   Subjective:  Lori [name] is a 82 y.o. female who presents for Medicare Annual Wellness Visit and 3 month follow up  She reports over all she is doing well.  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  She s/p TRH 10/17/19 and reports she is doing very well, she completed PT.  She ambulates with a cane.  Reports she is having some sciatic pains now on left side and working to improve this with Ortho, Dr Percell Miller.  Patient was hospitalized in Nov 2019  with a severe GI bleed (hgb 5 gm%) requiring  pRBC transfusions and found to have esophagitis, erosive gastropathy, Cameron lesion and a small colon polyp & was initiated on PPI. CBCs and iron levels have since resolved to normal range, continues on  daily iron.   Saw Hematology on 10/30/21 for tmt  of IDA then followed up again on 01/2022 for IV iron infusions.  She is to continue to consult with Dr. Collene Mares with gastroenterology for upper endoscopy and colonoscopy  She takes CBD oil PRN anxiety and LE pain, uses valium rarelly, tylenol as well for pain with aleve occasionally and reports she is managing well for vertigo and anxiety.    BMI is Body mass index is 30.29 kg/m., she has not been working on diet and exercise.   Wt Readings from Last 3 Encounters:  04/16/22 182 lb (82.6 kg)  01/14/22 190 lb 12.8 oz (86.5 kg)  11/06/21 176 lb 9.6 oz (80.1 kg)   Her blood pressure has been controlled at home, today their BP is BP: 118/76  She does not workout but is very active with 35 dogs and goats. She denies chest pain, shortness of breath, dizziness. In 2009, she had a false (+) Myoview confirmed by a Negative Heart Cath. Last Echo 09/13/19, EF 69%  She has history of Afib, CHA2DS2VASC 4, declined anticoag/NOAC for many years until recent PE and now doing well on eliquis.  She follows with Cardiology.   She is not on cholesterol medication and denies myalgias. Her LDL cholesterol is at goal. The cholesterol last visit was:   Lab Results  Component Value Date   CHOL 165 01/14/2022   HDL 58 01/14/2022   LDLCALC 87 01/14/2022   TRIG 100 01/14/2022   CHOLHDL 2.8 01/14/2022    She has been working on diet and exercise for prediabetes, and denies polydipsia, polyuria and visual disturbances. Last A1C in the office was:  Lab Results  Component Value Date   HGBA1C 5.3 01/14/2022   Recently had episode of AKI that did improve with cessation of diuretic/NSAID and advised increased water, admits 2-3 bottles of water, 2 bottles of pepsi  Lab Results  Component Value Date   GFRNONAA 61 07/02/2020   GFRNONAA 52 (L) 04/03/2020   GFRNONAA 43 (L) 01/02/2020   Lab Results  Component Value Date   CREATININE 0.82 01/14/2022   CREATININE 0.99  10/14/2021   CREATININE 1.02 (H) 07/04/2021   Patient is on Vitamin D supplement.   Lab Results  Component Value Date   VD25OH 57 01/14/2022      Blood pressure 118/76, pulse (!) 55, temperature (!) 97.5 F (36.4 C), height '5\' 5"'$  (1.651 m), weight 182 lb (82.6 kg), SpO2 99 %.  Medications   Current Outpatient Medications (Cardiovascular):    metoprolol tartrate (LOPRESSOR) 25 MG tablet, Take  1 tablet  Daily  for BP & Heart   triamterene-hydrochlorothiazide (MAXZIDE-25) 37.5-25 MG tablet, Take 1 tablet by mouth as needed. (Patient not taking: Reported on 04/16/2022)  Current Outpatient Medications (Respiratory):    umeclidinium-vilanterol (ANORO ELLIPTA) 62.5-25 MCG/ACT AEPB, Inhale 1 puff into the lungs daily.  Current Outpatient Medications (Analgesics):    acetaminophen (TYLENOL) 500 MG tablet, Take 1,000 mg by mouth every 6 (six) hours as needed for mild pain or moderate pain.  Current Outpatient Medications (Hematological):    apixaban (ELIQUIS) 5 MG TABS tablet, Take  1 tablet  2 x /day  to Prevent Blood Clots / Patient knows to take by mouth   ferrous sulfate 325 (65 FE) MG tablet, Take 325 mg by mouth daily with breakfast.  Current Outpatient Medications (Other):    Cholecalciferol 25 MCG (1000 UT) capsule, Take 1,000 Units by mouth daily.    [employer] MAGNESIUM PO, Take by mouth. Brand is Maganese   diazepam (VALIUM)  5 MG tablet, TAKE 1/2-1 TABLET BY MOUTH 2 TO 3 TIMES DAILY FOR DIZZINESS/VERTIGO   gabapentin (NEURONTIN) 300 MG capsule, Take  1 capsule  3 x /day for Nerve Pain                                                  /                    TAKE                           BY                     MOUTH   pantoprazole (PROTONIX) 40 MG tablet, Take  1 tablet  Daily  to Prevent Heartburn & Indigestion   No Known Allergies  Current Problems (verified) Patient Active Problem List   Diagnosis Date Noted   Thrombophilia ([place]) 04/20/2019   Spinal stenosis of lumbar region  04/20/2019   Iron deficiency anemia due to chronic blood loss 03/08/2019   Varicose vein of leg 09/01/2018   Gastroesophageal reflux disease with esophagitis    History of pulmonary embolism (01/2018) 02/04/2018   Senile purpura ([place]) 07/20/2017   Atrial fibrillation ([place]) 06/16/2017   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 Fuller Plan Last EGD: 01/2018 - esophagitis, cameron lesions, anticipate chronic iron loss Last mammogram: 09/2012 - she states if she did have cancer she would not want treatment Last pap smear/pelvic exam: 2009 DEXA: 2011 normal, declines further  Prior vaccinations: TD  2015  Influenza: declines Pneumococcal: 2013 Prevnar13: 2020 Shingles/Zostavax: declines Covid 19: declines   Hep C: screening done today with patient permission  Names of Other Physician/Practitioners you currently use: 1. [facility] here for primary care 2. [place], eye doctor, 2023 3. Needs one, dentist, remote, encouraged to schedule   Patient Care Team: Unk Pinto, MD as PCP - General (Internal Medicine) Unk Pinto, MD as PCP - Internal Medicine (Internal Medicine) Clent Jacks, MD as Consulting Physician (Ophthalmology) Ladene Artist, MD as Consulting Physician (Gastroenterology) Newton Pigg, [facility] (Inactive) as Pharmacist (Pharmacist)  SURGICAL HISTORY She  has a past surgical history that includes Tubal ligation; Cerebral aneurysm repair; Cataract extraction w/ intraocular lens   implant, bilateral (2009); Foot surgery (1993); Colonoscopy (N/A, 02/05/2018); Esophagogastroduodenoscopy (N/A, 02/05/2018); biopsy (02/05/2018); polypectomy (02/05/2018); and Total hip arthroplasty (Right, 10/17/2019). FAMILY HISTORY Her family history includes Cancer in her father and mother; Diabetes in her mother; Hypertension in her mother. SOCIAL HISTORY She  reports that she quit smoking about 31 years ago. Her smoking use included cigarettes. She has never used smokeless tobacco. She reports that she does not drink alcohol and does not use drugs.   MEDICARE WELLNESS OBJECTIVES: Physical activity: Current Exercise Habits: Home exercise routine, Exercise limited by: orthopedic condition(s);cardiac condition(s) Cardiac risk factors:  Depression/mood screen:      04/16/2022   11:48 AM  Depression screen PHQ 2/9  Decreased Interest 0  Down, Depressed, Hopeless 0  PHQ - 2 Score 0    ADLs:     04/16/2022   11:44 AM 01/17/2022   11:53 PM  In your present state of health, do you have any difficulty performing the following activities:  Hearing? 1 0  Vision? 0 0  Difficulty concentrating or making decisions? 0 0  Walking or climbing stairs? 0 0  Dressing or bathing? 0 0  Doing errands, shopping? 0 0  Preparing Food and eating ? N   Using the Toilet? N   In the past six months, have you accidently leaked urine? N   Do you have problems with loss of bowel control? N   Managing your Medications? N   Managing your Finances? N   Housekeeping or managing your Housekeeping? N      Cognitive Testing  Alert? Yes  Normal Appearance?Yes  Oriented to person? Yes  Place? Yes   Time? Yes  Recall of three objects?  Yes  Can perform simple calculations? Yes  Displays appropriate judgment?Yes  Can read the correct time from a watch face?Yes  EOL planning:    Review of Systems  Constitutional: Negative.  Negative for malaise/fatigue and weight loss.  HENT: Negative.  Negative for hearing  loss and tinnitus.   Eyes: Negative.  Negative for blurred vision and double vision.  Respiratory: Negative.  Negative for cough, sputum production, shortness of breath and wheezing.   Cardiovascular: Negative.  Negative for chest pain, palpitations, orthopnea, claudication, leg swelling and PND.  Gastrointestinal: Negative.  Negative for abdominal pain, blood in stool, constipation, diarrhea, heartburn, melena, nausea and vomiting.  Genitourinary: Negative.   Musculoskeletal:  Positive for joint pain (Left hip). Negative for falls and myalgias.  Skin: Negative.  Negative for rash.  Neurological:  Negative for dizziness, tingling, sensory change, weakness and headaches.  Endo/Heme/Allergies:  Negative for polydipsia.  Psychiatric/Behavioral: Negative.  Negative for depression, memory loss, substance abuse and suicidal ideas. The patient is not nervous/anxious and does not have insomnia.   All other systems reviewed and are negative.    Objective:     Today's Vitals   04/16/22 1108  BP: 118/76  Pulse: (!) 55  Temp: (!) 97.5 F (36.4 C)  SpO2: 99%  Weight: 182 lb (82.6 kg)  Height: '5\' 5"'$  (1.651 m)   Body mass index is 30.29 kg/m.  General appearance: alert, no distress, WD/WN, female HEENT: normocephalic, sclerae anicteric, TMs pearly, nares patent, no discharge or erythema, pharynx normal Oral cavity: MMM, no lesions Neck: supple, no lymphadenopathy, no thyromegaly, no masses Heart: RRR, normal S1, S2, no murmurs Lungs: CTA bilaterally, no wheezes, rhonchi, or rales Abdomen: +bs, soft, non tender, non distended, no masses, no hepatomegaly, no splenomegaly Musculoskeletal: nontender, no swelling, very slow sitting to standing, slow antalgic gait, bil wrists with bony enlargement without effusion Extremities: no edema, no cyanosis, no clubbing, numerous superficial varicose veins of bilateral lower legs Pulses: 2+ symmetric, upper and lower extremities, normal cap  refill Neurological: alert, oriented x 3, CN2-12 intact, strength normal upper extremities and lower extremities, sensation normal throughout, DTRs 2+ throughout, no cerebellar signs, gait antalgic Psychiatric: normal affect, behavior normal, pleasant   Medicare Attestation I have personally reviewed: The patient's medical and social history Their use of alcohol, tobacco or illicit drugs Their current medications and supplements The patient's functional ability including ADLs,fall  risks, home safety risks, cognitive, and hearing and visual impairment Diet and physical activities Evidence for depression or mood disorders  The patient's weight, height, BMI, and visual acuity have been recorded in the chart.  I have made referrals, counseling, and provided education to the patient based on review of the above and I have provided the patient with a written personalized care plan for preventive services.     Darrol Jump, NP   04/03/2020

## 2022-04-17 NOTE — H&P (Signed)
History and Physical    OTHELLO SGROI KDX:[id number] DOB: [birth year] DOA: 04/17/2022  PCP: Unk Pinto, MD   Patient coming from: Home  I have personally briefly reviewed patient's old medical records in [place]  Chief Complaint:  Fatigue , Tiredness and generalized weakness  HPI: Lori [name] is a 82 y.o. female with PMH significant for paroxysmal A-fib, history of PE on Eliquis, essential hypertension, hyperlipidemia, GERD, iron deficiency anemia presented in the ED with fatigue, tiredness and generalized weakness for few weeks.  Patient went to see her primary care physician who has ordered some labs.  Her labs came back today with hemoglobin 7.2 which has dropped from 11 three months back, Patient was advised to come to the ED.  Patient denies any obvious visible bleeding in the stools, urine, denies any hemoptysis, hematemesis or epistaxis.  She reports feeling tired and fatigued, states she feels like she wants to sleep 24 hours.  She lives alone with her pets, denies any chest pain, shortness of breath, dizziness, palpitations.  ED Course: She is hemodynamically stable except hypertension. HR 68, RR 15, temp 98.4, BP 145 /74, SpO2 100% on room air Labs include sodium 140, potassium 4.4, chloride 107, bicarb 26, glucose 104, BUN 20, creatinine 1.09, calcium 8.8, anion gap 7, alkaline phosphatase 76, albumin 3.9, AST 15, ALT 10, total protein 6.3, total bilirubin 0.4, WBC 4.7, hemoglobin 7.3, hematocrit 25.7, MCV 86.8, platelet 234, stool for occult blood negative.  Review of Systems: Review of Systems  Constitutional:  Positive for malaise/fatigue.  HENT: Negative.    Eyes: Negative.   Respiratory: Negative.    Cardiovascular: Negative.   Gastrointestinal: Negative.   Genitourinary: Negative.   Musculoskeletal: Negative.   Skin: Negative.   Neurological:  Positive for weakness.  Endo/Heme/Allergies: Negative.   Psychiatric/Behavioral: Negative.      Past Medical  History:  Diagnosis Date   Anemia    Arthritis    Benign neoplasm of sigmoid colon    Deafness    left ear per pt    Dyspnea    pt reports with exertion due to hip and knees    Dysrhythmia    afib    History of bleeding ulcers    2019 received 3-4 units of blood    Hyperlipidemia    Hypertension    Pericardial effusion 02/04/2018   Pre-diabetes    per Dr Einar Gip note of 09/28/19 pt denies at preop visit on 10/06/19    Prediabetes    Primary localized osteoarthritis of right hip 09/26/2019   Pulmonary embolism ([place])    hx of 2019    Vertigo    Vitamin D deficiency     Past Surgical History:  Procedure Laterality Date   BIOPSY  02/05/2018   Procedure: BIOPSY;  Surgeon: Ladene Artist, MD;  Location: [facility];  Service: Endoscopy;;   CATARACT EXTRACTION W/ INTRAOCULAR LENS  IMPLANT, BILATERAL  2009   CEREBRAL ANEURYSM REPAIR     COLONOSCOPY N/A 02/05/2018   Procedure: COLONOSCOPY;  Surgeon: Ladene Artist, MD;  Location: [facility];  Service: Endoscopy;  Laterality: N/A;   ESOPHAGOGASTRODUODENOSCOPY N/A 02/05/2018   Procedure: ESOPHAGOGASTRODUODENOSCOPY (EGD);  Surgeon: Ladene Artist, MD;  Location: [facility];  Service: Endoscopy;  Laterality: N/A;   FOOT SURGERY  1993   POLYPECTOMY  02/05/2018   Procedure: POLYPECTOMY;  Surgeon: Ladene Artist, MD;  Location: [facility];  Service: Endoscopy;;   TOTAL HIP ARTHROPLASTY Right 10/17/2019   Procedure:  TOTAL HIP ARTHROPLASTY ANTERIOR APPROACH;  Surgeon: Renette Butters, MD;  Location: [facility];  Service: Orthopedics;  Laterality: Right;   TUBAL LIGATION       reports that she quit smoking about 31 years ago. Her smoking use included cigarettes. She has never used smokeless tobacco. She reports that she does not drink alcohol and does not use drugs.  No Known Allergies  Family History  Problem Relation Age of Onset   Hypertension Mother    Diabetes Mother    Cancer Mother        Pancreatic   Cancer Father         Skin   Colon cancer Neg Hx    Stomach cancer Neg Hx    Esophageal cancer Neg Hx    Family history reviewed and not pertinent.  Prior to Admission medications   Medication Sig Start Date End Date Taking? Authorizing Provider  acetaminophen (TYLENOL) 500 MG tablet Take 500-1,000 mg by mouth every 6 (six) hours as needed for mild pain or moderate pain.   Yes [provider]  apixaban (ELIQUIS) 5 MG TABS tablet Take  1 tablet  2 x /day  to Prevent Blood Clots / Patient knows to take by mouth Patient taking differently: Take 5 mg by mouth in the morning and at bedtime. 06/03/21  Yes Unk Pinto, MD  Cholecalciferol (VITAMIN D3) 1000 units CAPS Take 1,000-4,000 Units by mouth daily.   Yes [provider]  diazepam (VALIUM) 5 MG tablet TAKE 1/2-1 TABLET BY MOUTH 2 TO 3 TIMES DAILY FOR DIZZINESS/VERTIGO Patient taking differently: Take 5-10 mg by mouth See admin instructions. TAKE 5-10 MG BY MOUTH 2 TIMES A DAY AS NEEDED FOR DIZZINESS/VERTIGO 01/15/22  Yes Alycia Rossetti, NP  ferrous sulfate 325 (65 FE) MG tablet Take 325 mg by mouth See admin instructions. Take 325 mg by mouth three times a week with food   Yes [provider]  gabapentin (NEURONTIN) 300 MG capsule Take  1 capsule  3 x /day for Nerve Pain                                                  /                    TAKE                           BY                     MOUTH Patient taking differently: Take 600 mg by mouth at bedtime. 07/04/21  Yes Unk Pinto, MD  metoprolol tartrate (LOPRESSOR) 25 MG tablet Take  1 tablet  Daily  for BP & Heart Patient taking differently: Take 25 mg by mouth at bedtime. 07/04/21  Yes Unk Pinto, MD  pantoprazole (PROTONIX) 40 MG tablet Take  1 tablet  Daily  to Prevent Heartburn & Indigestion Patient taking differently: Take 40 mg by mouth daily before breakfast. 07/04/21  Yes Unk Pinto, MD    Physical Exam: Vitals:   04/17/22 1445 04/17/22 1500  04/17/22 1727  BP: (!) 146/72 136/66 (!) 155/72  Pulse: 64 71 69  Resp: 20  18  Temp: 98 F (36.7 C)    SpO2: 100% 93% 99%  Constitutional: Appears comfortable, not in any acute distress. Vitals:   04/17/22 1445 04/17/22 1500 04/17/22 1727  BP: (!) 146/72 136/66 (!) 155/72  Pulse: 64 71 69  Resp: 20  18  Temp: 98 F (36.7 C)    SpO2: 100% 93% 99%   Eyes: PERRL, lids and conjunctivae normal ENMT: Mucous membranes are moist.  Posterior pharynx without exudate. Neck: normal, supple, no masses, no thyromegaly Respiratory: CTA bilaterally, respiratory effort normal, no accessory muscle use, RR 15 Cardiovascular: S1 S2 heard, regular rate and rhythm, no murmur. Abdomen: Soft, nontender, nondistended, BS+ Musculoskeletal: no clubbing / cyanosis.  No edema, good ROM, no contractures. Normal muscle tone.  Skin: no rashes, lesions, ulcers. No induration Neurologic: CN 2-12 grossly intact. Sensation intact, DTR normal. Strength 5/5 in all 4.  Psychiatric: Normal judgment and insight. Alert and oriented x 3. Normal mood.   Labs on Admission: I have personally reviewed following labs and imaging studies  CBC: Recent Labs  Lab 04/16/22 0000 04/17/22 1508  WBC 5.4 4.7  NEUTROABS 3,132 3.1  HGB 7.2* 7.3*  HCT 24.3* 25.7*  MCV 83.5 86.8  PLT 294 629   Basic Metabolic Panel: Recent Labs  Lab 04/16/22 0000 04/17/22 1508  NA 142 140  K 5.1 4.4  CL 108 107  CO2 27 26  GLUCOSE 83 104*  BUN 15 20  CREATININE 1.06* 1.09*  CALCIUM 9.3 8.8*   GFR: Estimated Creatinine Clearance: 44.4 mL/min (A) (by C-G formula based on SCr of 1.09 mg/dL (H)). Liver Function Tests: Recent Labs  Lab 04/16/22 0000 04/17/22 1508  AST 13 15  ALT 8 10  ALKPHOS  --  76  BILITOT 0.3 0.4  PROT 5.9* 6.3*  ALBUMIN  --  3.9   No results for input(s): "LIPASE", "AMYLASE" in the last 168 hours. No results for input(s): "AMMONIA" in the last 168 hours. Coagulation Profile: No results for input(s):  "INR", "PROTIME" in the last 168 hours. Cardiac Enzymes: No results for input(s): "CKTOTAL", "CKMB", "CKMBINDEX", "TROPONINI" in the last 168 hours. BNP (last 3 results) No results for input(s): "PROBNP" in the last 8760 hours. HbA1C: Recent Labs    04/16/22 0000  HGBA1C 5.5   CBG: No results for input(s): "GLUCAP" in the last 168 hours. Lipid Profile: Recent Labs    04/16/22 0000  CHOL 120  HDL 45*  LDLCALC 56  TRIG 108  CHOLHDL 2.7   Thyroid Function Tests: No results for input(s): "TSH", "T4TOTAL", "FREET4", "T3FREE", "THYROIDAB" in the last 72 hours. Anemia Panel: Recent Labs    04/16/22 0000  FERRITIN 3*  TIBC 406  IRON 27*   Urine analysis:    Component Value Date/Time   COLORURINE YELLOW 07/04/2021 1010   APPEARANCEUR CLEAR 07/04/2021 1010   LABSPEC 1.015 07/04/2021 1010   PHURINE 5.5 07/04/2021 1010   GLUCOSEU NEGATIVE 07/04/2021 1010   HGBUR NEGATIVE 07/04/2021 1010   BILIRUBINUR NEGATIVE 02/11/2016 1604   KETONESUR NEGATIVE 07/04/2021 1010   PROTEINUR NEGATIVE 07/04/2021 1010   NITRITE NEGATIVE 07/04/2021 1010   LEUKOCYTESUR 2+ (A) 07/04/2021 1010    Radiological Exams on Admission: No results found.  EKG: EKG ordered.  Please review  Assessment/Plan Principal Problem:   Iron deficiency anemia Active Problems:   Vitamin D deficiency   Essential hypertension   Hyperlipidemia, mixed   Vertigo due to brain injury ([place])   Obesity (BMI 30.0-34.9)   Atrial fibrillation ([place])   History of pulmonary embolism (01/2018)   Gastroesophageal reflux disease with  esophagitis   Normochromic normocytic anemia. Acute anemia in the setting of Eliquis use. Patient presented with fatigue, tiredness, generalized weakness. Her hemoglobin has dropped to 7.2 from 11 g 3 months ago. Patient denies any obvious visible bleeding in the stools, and the urine hematemesis or epistaxis. Stool for occult blood negative, Transfuse 1 unit PRBC. Start pantoprazole 40  mg IV every 12 hours. Monitor H&H every 12 hours. GI is consulted, awaiting recommendation. Hold Eliquis,  Iron deficiency anemia: Continue Ferrous sulfate  Paroxysmal atrial fibrillation: Heart rate remains well-controlled. Continue metoprolol for heart rate control, Hold Eliquis in the setting of anemia  History of PE: Hold Eliquis for now. She does not appear hypoxic.  GERD: Continue pantoprazole.  Vertigo: Continue diazepam as prescribed.  Essential hypertension: Continue metoprolol 25 mg daily.  Hyperlipidemia: Continue Lipitor:  Obesity:  diet and exercise discussed in detail   DVT prophylaxis: SCDs Code Status: Full code Family Communication: No family at bed side. Disposition Plan:     Status is: Observation The patient remains OBS appropriate and will d/c before 2 midnights. Admitted for worsening anemia in the setting of eliquis use. GI consulted.   Consults called: [facility] Admission status: Telemetry   Shawna Clamp MD [facility]   If 7PM-7AM, please contact night-coverage   04/17/2022, 5:50 PM

## 2022-04-17 NOTE — ED Notes (Signed)
Rectal exam done with this rn and md [employer]

## 2022-04-18 DIAGNOSIS — D500 Iron deficiency anemia secondary to blood loss (chronic): Secondary | ICD-10-CM | POA: Diagnosis not present

## 2022-04-18 DIAGNOSIS — K2100 Gastro-esophageal reflux disease with esophagitis, without bleeding: Secondary | ICD-10-CM

## 2022-04-18 DIAGNOSIS — D649 Anemia, unspecified: Secondary | ICD-10-CM | POA: Diagnosis not present

## 2022-04-18 LAB — COMPREHENSIVE METABOLIC PANEL
ALT: 10 U/L (ref 0–44)
AST: 13 U/L — ABNORMAL LOW (ref 15–41)
Albumin: 3.4 g/dL — ABNORMAL LOW (ref 3.5–5.0)
Alkaline Phosphatase: 65 U/L (ref 38–126)
Anion gap: 6 (ref 5–15)
BUN: 25 mg/dL — ABNORMAL HIGH (ref 8–23)
CO2: 25 mmol/L (ref 22–32)
Calcium: 8.1 mg/dL — ABNORMAL LOW (ref 8.9–10.3)
Chloride: 109 mmol/L (ref 98–111)
Creatinine, Ser: 0.9 mg/dL (ref 0.44–1.00)
GFR, Estimated: >60 mL/min (ref >60)
Glucose, Bld: 110 mg/dL — ABNORMAL HIGH (ref 70–99)
Potassium: 4.1 mmol/L (ref 3.5–5.1)
Sodium: 140 mmol/L (ref 135–145)
Total Bilirubin: 0.8 mg/dL (ref 0.3–1.2)
Total Protein: 5.2 g/dL — ABNORMAL LOW (ref 6.5–8.1)

## 2022-04-18 LAB — TYPE AND SCREEN
ABO/RH(D): A POS
Antibody Screen: NEGATIVE
Unit division: 0

## 2022-04-18 LAB — BPAM RBC
Blood Product Expiration Date: 202402262359
ISSUE DATE / TIME: 202402021953
Unit Type and Rh: 6200

## 2022-04-18 LAB — CBC
HCT: 25.6 % — ABNORMAL LOW (ref 36.0–46.0)
Hemoglobin: 7.5 g/dL — ABNORMAL LOW (ref 12.0–15.0)
MCH: 25.4 pg — ABNORMAL LOW (ref 26.0–34.0)
MCHC: 29.3 g/dL — ABNORMAL LOW (ref 30.0–36.0)
MCV: 86.8 fL (ref 80.0–100.0)
Platelets: 223 10*3/uL (ref 150–400)
RBC: 2.95 MIL/uL — ABNORMAL LOW (ref 3.87–5.11)
RDW: 15.9 % — ABNORMAL HIGH (ref 11.5–15.5)
WBC: 4.6 10*3/uL (ref 4.0–10.5)
nRBC: 0 % (ref 0.0–0.2)

## 2022-04-18 LAB — MAGNESIUM: Magnesium: 2.2 mg/dL (ref 1.7–2.4)

## 2022-04-18 LAB — PHOSPHORUS: Phosphorus: 4.5 mg/dL (ref 2.5–4.6)

## 2022-04-18 MED ORDER — FERROUS SULFATE 325 (65 FE) MG PO TABS: 325.0000 mg | ORAL_TABLET | ORAL | 3 refills | Status: AC

## 2022-04-18 MED ORDER — PANTOPRAZOLE SODIUM 40 MG PO TBEC: 40.0000 mg | DELAYED_RELEASE_TABLET | Freq: Two times a day (BID) | ORAL | 3 refills | Status: DC

## 2022-04-18 NOTE — Progress Notes (Signed)
  Transition of Care [facility]) Screening Note   Patient Details  Name: Lori [name] Date of Birth: Feb 01, 1941   Transition of Care [facility]) CM/SW Contact:    Vassie Moselle, LCSW Phone Number: 04/18/2022, 2:06 PM    Transition of Care Department [facility]) has reviewed patient and no TOC needs have been identified at this time. We will continue to monitor patient advancement through interdisciplinary progression rounds. If new patient transition needs arise, please place a TOC consult.

## 2022-04-18 NOTE — Progress Notes (Signed)
I attest to student documentation.  Ammie Ferrier, MSN-RN [place]

## 2022-04-18 NOTE — Consult Note (Addendum)
Referring Provider:  Shawna Clamp         Primary Care Physician:  Unk Pinto, MD Primary Gastroenterologist: Previously Dr. Olevia Perches      Reason for Consultation:  Iron Deficiency Anemia                 ASSESSMENT /  PLAN    IDA History of esophagitis History of hiatal hernia GERD PE and A-fib on Eliquis Patient presents with symptomatic anemia and was found to have a drop from 11.8 in 01/2022 to 7.2, which has remained stable since she has been in the hospital. She has no gross evidence of bleeding. Ferritin was low at 3, confirming IDA. It is suspected that the most likely source of her IDA is esophagitis and/or [name], which were visualized on her last EGD in 2019. I offered the patient the opportunity to get an EGD and colonoscopy tomorrow in order to rule out underlying malignancy and to determine the source of her GI bleeding.  However, patient is adamant that she needs to leave the hospital today in order to go home to feed her pets.  I did tell the patient that there is the possibility that her blood counts could drop further and that she could develop more weakness, lightheadedness, and bleeding.  She may also delay a diagnosis of malignancy or end up back in the hospital due to recurrent symptomatic anemia. Patient understands and prefers to get her procedures done as an outpatient. - Increase PO pantoprazole to 40 mg twice daily - Restart oral iron supplements - Okay to restart Eliquis therapy due to patient's history of pulmonary embolism - I will message her hematologist Dr. Lindi Adie to see if he can set her up for more IV iron infusions - We will arrange for outpatient GI follow up. I have messaged to request this.  HPI:     Lori [name] is a 82 y.o. female with history of PE on Eliquis, A-fib, GERD, hiatal hernia, vertigo,arthritis presented with weakness, found to have IDA. Her Hb dropped from 11.8 three months ago to 7.2 so patient was advised to come to the  ED by her PCP. Denies hematochezia or melena.  Endorses dyspnea on exertion and dizziness when she first stands up.  Her last dose of Eliquis was yesterday evening.  Denies use of NSAIDs.  She denies nausea, vomiting, trouble swallowing, chest burning, regurgitation, diarrhea, constipation, abdominal pain.  Denies alcohol use.  Denies family history of GI issues.  Patient has not always been good about taking her medications so she has not been consistently taking her oral iron supplement or her PPI therapy.  EGD 02/05/18:  Path: 2. Esophagus, biopsy - SQUAMOCOLUMNAR ESOPHAGEAL MUCOSA, ULCERATED AND INFLAMED WITH GRANULATION TISSUE AND NECROINFLAMMATORY DEBRIS. - NEGATIVE FOR INTESTINAL (GOBLET CELL) METAPLASIA. Microscopic Comment 2. Special PAS-F stain is negative for fungal elements. Immunohistochemistry for HSV and CMV is negative.  Colonoscopy 02/05/18:  Path: 1. Colon, polyp(s), sigmoid - HYPERPLASTIC POLYP.  Past Medical History:  Diagnosis Date   Anemia    Arthritis    Benign neoplasm of sigmoid colon    Deafness    left ear per pt    Dyspnea    pt reports with exertion due to hip and knees    Dysrhythmia    afib    History of bleeding ulcers    2019 received 3-4 units of blood    Hyperlipidemia    Hypertension    Pericardial effusion 02/04/2018  Pre-diabetes    per Dr Einar Gip note of 09/28/19 pt denies at preop visit on 10/06/19    Prediabetes    Primary localized osteoarthritis of right hip 09/26/2019   Pulmonary embolism ([place])    hx of 2019    Vertigo    Vitamin D deficiency     Past Surgical History:  Procedure Laterality Date   BIOPSY  02/05/2018   Procedure: BIOPSY;  Surgeon: Ladene Artist, MD;  [facility];  Service: Endoscopy;;   CATARACT EXTRACTION W/ INTRAOCULAR LENS  IMPLANT, BILATERAL  2009   CEREBRAL ANEURYSM REPAIR     COLONOSCOPY N/A 02/05/2018   Procedure: COLONOSCOPY;  Surgeon: Ladene Artist, MD;  [facility];   Service: Endoscopy;  Laterality: N/A;   ESOPHAGOGASTRODUODENOSCOPY N/A 02/05/2018   Procedure: ESOPHAGOGASTRODUODENOSCOPY (EGD);  Surgeon: Ladene Artist, MD;  Location: [facility];  Service: Endoscopy;  Laterality: N/A;   FOOT SURGERY  1993   POLYPECTOMY  02/05/2018   Procedure: POLYPECTOMY;  Surgeon: Ladene Artist, MD;  [facility];  Service: Endoscopy;;   TOTAL HIP ARTHROPLASTY Right 10/17/2019   Procedure: TOTAL HIP ARTHROPLASTY ANTERIOR APPROACH;  Surgeon: Renette Butters, MD;  Location: [facility];  Service: Orthopedics;  Laterality: Right;   TUBAL LIGATION      Prior to Admission medications   Medication Sig Start Date End Date Taking? Authorizing Provider  acetaminophen (TYLENOL) 500 MG tablet Take 500-1,000 mg by mouth every 6 (six) hours as needed for mild pain or moderate pain.   Yes [provider]  apixaban (ELIQUIS) 5 MG TABS tablet Take  1 tablet  2 x /day  to Prevent Blood Clots / Patient knows to take by mouth Patient taking differently: Take 5 mg by mouth in the morning and at bedtime. 06/03/21  Yes Unk Pinto, MD  Cholecalciferol (VITAMIN D3) 1000 units CAPS Take 1,000-4,000 Units by mouth daily.   Yes [provider]  diazepam (VALIUM) 5 MG tablet TAKE 1/2-1 TABLET BY MOUTH 2 TO 3 TIMES DAILY FOR DIZZINESS/VERTIGO Patient taking differently: Take 5-10 mg by mouth See admin instructions. TAKE 5-10 MG BY MOUTH 2 TIMES A DAY AS NEEDED FOR DIZZINESS/VERTIGO 01/15/22  Yes Alycia Rossetti, NP  ferrous sulfate 325 (65 FE) MG tablet Take 325 mg by mouth See admin instructions. Take 325 mg by mouth three times a week with food   Yes [provider]  gabapentin (NEURONTIN) 300 MG capsule Take  1 capsule  3 x /day for Nerve Pain                                                  /                    TAKE                           BY                     MOUTH Patient taking differently: Take 600 mg by mouth at bedtime. 07/04/21  Yes Unk Pinto, MD  metoprolol tartrate (LOPRESSOR) 25 MG tablet Take  1 tablet  Daily  for BP & Heart Patient taking differently: Take 25 mg by mouth at bedtime. 07/04/21  Yes [place],  Gwyndolyn Saxon, MD  pantoprazole (PROTONIX) 40 MG tablet Take  1 tablet  Daily  to Prevent Heartburn & Indigestion Patient taking differently: Take 40 mg by mouth daily before breakfast. 07/04/21  Yes Unk Pinto, MD    Current Facility-Administered Medications  Medication Dose Route Frequency Provider Last Rate Last Admin   0.9 %  sodium chloride infusion   Intravenous Continuous Shawna Clamp, MD 50 mL/hr at 04/17/22 1759 New Bag at 04/17/22 1759   acetaminophen (TYLENOL) tablet 650 mg  650 mg Oral Q6H PRN Shawna Clamp, MD       Or   acetaminophen (TYLENOL) suppository 650 mg  650 mg Rectal Q6H PRN Shawna Clamp, MD       diazepam (VALIUM) tablet 5-10 mg  5-10 mg Oral BID PRN Shawna Clamp, MD       docusate sodium (COLACE) capsule 100 mg  100 mg Oral BID Shawna Clamp, MD       [START ON 04/21/2022] ferrous sulfate tablet 325 mg  325 mg Oral BID WC Shawna Clamp, MD       gabapentin (NEURONTIN) capsule 600 mg  600 mg Oral QHS Shawna Clamp, MD   600 mg at 04/17/22 2133   metoprolol tartrate (LOPRESSOR) tablet 25 mg  25 mg Oral QHS Shawna Clamp, MD   25 mg at 04/17/22 2127   ondansetron (ZOFRAN) tablet 4 mg  4 mg Oral Q6H PRN Shawna Clamp, MD       Or   ondansetron [facility]) injection 4 mg  4 mg Intravenous Q6H PRN Shawna Clamp, MD       pantoprazole (PROTONIX) injection 40 mg  40 mg Intravenous Q12H Shawna Clamp, MD   40 mg at 04/18/22 0174   senna (SENOKOT) tablet 8.6 mg  1 tablet Oral BID Shawna Clamp, MD        Allergies as of 04/17/2022   (No Known Allergies)    Family History  Problem Relation Age of Onset   Hypertension Mother    Diabetes Mother    Cancer Mother        Pancreatic   Cancer Father        Skin   Colon cancer Neg Hx    Stomach cancer Neg Hx    Esophageal cancer Neg Hx      Social History   Tobacco Use   Smoking status: Former    Types: Cigarettes    Quit date: 08/07/1990    Years since quitting: 31.7   Smokeless tobacco: Never  Vaping Use   Vaping Use: Never used  Substance Use Topics   Alcohol use: No    Alcohol/week: 0.0 standard drinks of alcohol   Drug use: No    Review of Systems: All systems reviewed and negative except where noted in HPI.  Physical Exam: Vital signs in last 24 hours: Temp:  [97.6 F (36.4 C)-98.4 F (36.9 C)] 97.6 F (36.4 C) (02/03 0548) Pulse Rate:  [64-80] 64 (02/03 0548) Resp:  [16-20] 16 (02/03 0548) BP: (109-155)/(61-79) 143/65 (02/03 0548) SpO2:  [93 %-100 %] 94 % (02/03 0548) Weight:  [82.5 kg] 82.5 kg (02/02 1843) Last BM Date : 04/16/22 General:   Awake, alert, NAD Psych:  Pleasant, cooperative. Normal mood and affect. Eyes:  Pupils equal, sclera clear, no icterus.    Neck:  Supple; no masses Lungs: No increased work of breathing Heart:  Regular rate and rhythm; no murmurs, no lower extremity edema Abdomen:  Soft, non-distended, nontender, BS active, no palp mass  Rectal:  Deferred  Msk:  Symmetrical without gross deformities. . Neurologic:  Alert and  oriented x4;  grossly normal neurologically. Skin:  Intact without significant lesions or rashes.   Intake/Output from previous day: 02/02 0701 - 02/03 0700 In: 1049.6 [I.V.:678.8; Blood:370.8] Out: -  Intake/Output this shift: Total I/O In: 240 [P.O.:240] Out: -   Lab Results: Recent Labs    04/16/22 0000 04/17/22 1508 04/18/22 0124  WBC 5.4 4.7 4.6  HGB 7.2* 7.3* 7.5*  HCT 24.3* 25.7* 25.6*  PLT 294 234 223   BMET Recent Labs    04/16/22 0000 04/17/22 1508 04/18/22 0124  NA 142 140 140  K 5.1 4.4 4.1  CL 108 107 109  CO2 '27 26 25  '$ GLUCOSE 83 104* 110*  BUN 15 20 25*  CREATININE 1.06* 1.09* 0.90  CALCIUM 9.3 8.8* 8.1*   LFT Recent Labs    04/18/22 0124  PROT 5.2*  ALBUMIN 3.4*  AST 13*  ALT 10  ALKPHOS 65   BILITOT 0.8   PT/INR No results for input(s): "LABPROT", "INR" in the last 72 hours. Hepatitis Panel No results for input(s): "HEPBSAG", "HCVAB", "HEPAIGM", "HEPBIGM" in the last 72 hours.   .    Latest Ref Rng & Units 04/18/2022    1:24 AM 04/17/2022    3:08 PM 04/16/2022   12:00 AM  CBC  WBC 4.0 - 10.5 K/uL 4.6  4.7  5.4   Hemoglobin 12.0 - 15.0 g/dL 7.5  7.3  7.2   Hematocrit 36.0 - 46.0 % 25.6  25.7  24.3   Platelets 150 - 400 K/uL 223  234  294     .    Latest Ref Rng & Units 04/18/2022    1:24 AM 04/17/2022    3:08 PM 04/16/2022   12:00 AM  CMP  Glucose 70 - 99 mg/dL 110  104  83   BUN 8 - 23 mg/dL '25  20  15   '$ Creatinine 0.44 - 1.00 mg/dL 0.90  1.09  1.06   Sodium 135 - 145 mmol/L 140  140  142   Potassium 3.5 - 5.1 mmol/L 4.1  4.4  5.1   Chloride 98 - 111 mmol/L 109  107  108   CO2 22 - 32 mmol/L '25  26  27   '$ Calcium 8.9 - 10.3 mg/dL 8.1  8.8  9.3   Total Protein 6.5 - 8.1 g/dL 5.2  6.3  5.9   Total Bilirubin 0.3 - 1.2 mg/dL 0.8  0.4  0.3   Alkaline Phos 38 - 126 U/L 65  76    AST 15 - 41 U/L '13  15  13   '$ ALT 0 - 44 U/L '10  10  8    '$ Studies/Results: No results found.  Principal Problem:   Iron deficiency anemia Active Problems:   Vitamin D deficiency   Essential hypertension   Hyperlipidemia, mixed   Vertigo due to brain injury ([place])   Obesity (BMI 30.0-34.9)   Atrial fibrillation ([name])   History of pulmonary embolism (01/2018)   Gastroesophageal reflux disease with esophagitis    Christia Reading, M.D. @  04/18/2022, 10:19 AM

## 2022-04-18 NOTE — Discharge Instructions (Signed)
Advised to take ferrous sulfate 3 times daily for iron deficiency anemia. Advised to take Protonix 40 mg daily Advised to follow-up with gastroenterology for outpatient colonoscopy and EGD.

## 2022-04-18 NOTE — Discharge Summary (Addendum)
Physician Discharge Summary  Lori [name] YKD:[id number] DOB: 03/18/1940 DOA: 04/17/2022  PCP: Unk Pinto, MD  Admit date: 04/17/2022  Discharge date: 04/18/2022  Admitted From: Home  Disposition:  Home  Recommendations for Outpatient Follow-up:  Follow up with PCP in 1-2 weeks. Please obtain BMP/CBC in one week. Advised to take ferrous sulfate 3 times daily for iron deficiency anemia. Advised to take Protonix 40 mg twice daily Advised to follow-up with gastroenterology for outpatient colonoscopy and EGD.  Home Health:None Equipment/Devices:None  Discharge Condition: Stable CODE STATUS:Full code Diet recommendation: Heart Healthy   Brief [name] Course: This 82 y.o. female with PMH significant for paroxysmal A-fib, history of PE on Eliquis, essential hypertension, hyperlipidemia, GERD, iron deficiency anemia presented in the ED with fatigue, tiredness and generalized weakness for few weeks.  Patient went to see her primary care physician who has ordered some labs.  Her labs came back today with hemoglobin 7.2 which has dropped from 11 three months back, Patient was advised to come to the ED.  Patient denies any obvious visible bleeding in the stools, urine, denies any hemoptysis, hematemesis or epistaxis.  She reports feeling tired and fatigued, states she feels like she wants to sleep 24 hours. She lives alone with her pets, denies any chest pain, shortness of breath, dizziness, palpitations.  Patient was admitted for further evaluation and started on Protonix 40 mg IV every 12 hours.  Stool for occult blood negative.Gastroenterology was consulted.  Patient was given 1 unit of packed red blood cells.  Hemoglobin has improved to 7.5.  Patient does not want to have GI intervention while inpatient, she wants to be discharged and will follow-up outpatient for colonoscopy and gastroenterology.  Gastroenterology is in agreement.  Patient is being discharged home.   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 Acute anemia in the setting of Eliquis use. Patient presented with fatigue, tiredness, generalized weakness. Her hemoglobin has dropped to 7.2 from 11 g 3 months ago. Patient denies any obvious visible bleeding in the stools, and the urine,  hematemesis or epistaxis. Stool for occult blood negative x2, S/p 1 PRBC> Hb 7.5 Continue  pantoprazole 40 mg IV every 12 hours. Monitor H&H every 12 hours. GI is consulted, patient wants to have outpatient colonoscopy and EGD Eliquis resumed.  Patient wants to be discharged.  GI is in agreement.   Iron deficiency anemia: Continue Ferrous sulfate   Paroxysmal atrial fibrillation: Heart rate remains well-controlled. Continue metoprolol for heart rate control, Resume Eliquis.   History of PE: Resume Eliquis. She does not appear hypoxic.   GERD: Continue pantoprazole.   Vertigo: Continue diazepam as prescribed.   Essential hypertension: Continue metoprolol 25 mg daily.   Hyperlipidemia: Continue Lipitor:   Obesity:  diet and exercise discussed in detail  Discharge Instructions  Discharge Instructions     Call MD for:  difficulty breathing, headache or visual disturbances   Complete by: As directed    Call MD for:  persistant dizziness or light-headedness   Complete by: As directed    Call MD for:  persistant nausea and vomiting   Complete by: As directed    Diet -  low sodium heart healthy   Complete by: As directed    Diet Carb Modified   Complete by: As directed    Discharge instructions   Complete by: As directed    Advised to follow-up  with primary care physician in 1 week. Advised to take ferrous sulfate 3 times daily for iron deficiency anemia. Advised to take Protonix 40 mg daily Advised to follow-up with gastroenterology for outpatient colonoscopy and EGD.   Increase activity slowly   Complete by: As directed       Allergies as of 04/18/2022   No Known Allergies      Medication List     TAKE these medications    acetaminophen 500 MG tablet Commonly known as: TYLENOL Take 500-1,000 mg by mouth every 6 (six) hours as needed for mild pain or moderate pain.   apixaban 5 MG Tabs tablet Commonly known as: Eliquis Take  1 tablet  2 x /day  to Prevent Blood Clots / Patient knows to take by mouth What changed:  how much to take how to take this when to take this additional instructions   diazepam 5 MG tablet Commonly known as: VALIUM TAKE 1/2-1 TABLET BY MOUTH 2 TO 3 TIMES DAILY FOR DIZZINESS/VERTIGO What changed:  how much to take how to take this when to take this additional instructions   ferrous sulfate 325 (65 FE) MG tablet Take 1 tablet (325 mg total) by mouth See admin instructions. Take 325 mg by mouth three times a week with food   gabapentin 300 MG capsule Commonly known as: NEURONTIN Take  1 capsule  3 x /day for Nerve Pain                                                  /                    TAKE                           BY                     MOUTH What changed:  how much to take how to take this when to take this additional instructions   metoprolol tartrate 25 MG tablet Commonly known as: LOPRESSOR Take  1 tablet  Daily  for BP & Heart What changed:  how much to take how to take this when to take this additional instructions   pantoprazole 40 MG tablet Commonly known as: PROTONIX Take 1 tablet (40 mg total) by mouth 2 (two) times daily. Take  1 tablet  Daily  to Prevent Heartburn & Indigestion What changed:  how much to take how to take this when to take this   Vitamin  D3 1000 units Caps Take 1,000-4,000 Units by mouth daily.        Follow-up Information     Unk Pinto, MD Follow up in 1 week(s).   Specialty: Internal Medicine Contact information: [address] [place] [address] [phone]         Sharyn Creamer, MD Follow up in 1 week(s).   Specialty: Gastroenterology Contact information: [place] [address] [phone]  No Known Allergies  Consultations: gastroenterology   Procedures/Studies: No results found.  Subjective: Patient was seen and examined at bedside.  Overnight events noted.   Patient report doing much better and wants to be discharged.  Denies any visible bleeding.  Discharge Exam: Vitals:   04/18/22 0548 04/18/22 1313  BP: (!) 143/65 (!) 119/52  Pulse: 64 65  Resp: 16 18  Temp: 97.6 F (36.4 C) 98.6 F (37 C)  SpO2: 94% 98%   Vitals:   04/17/22 2245 04/18/22 0233 04/18/22 0548 04/18/22 1313  BP: 132/69 138/61 (!) 143/65 (!) 119/52  Pulse: 66 65 64 65  Resp:  '20 16 18  '$ Temp: 98.4 F (36.9 C) 98.2 F (36.8 C) 97.6 F (36.4 C) 98.6 F (37 C)  TempSrc: Oral Oral  Oral  SpO2: 98% 97% 94% 98%  Weight:      Height:        General: Pt is alert, awake, not in acute distress Cardiovascular: RRR, S1/S2 +, no rubs, no gallops Respiratory: CTA bilaterally, no wheezing, no rhonchi Abdominal: Soft, NT, ND, bowel sounds + Extremities: no edema, no cyanosis    The results of significant diagnostics from this hospitalization (including imaging, microbiology, ancillary and laboratory) are listed below for reference.     Microbiology: No results found for this or any previous visit (from the past 240 hour(s)).   Labs: BNP (last 3 results) No results for input(s): "BNP" in the last 8760 hours. Basic Metabolic Panel: Recent Labs  Lab 04/16/22 0000 04/17/22 1508 04/18/22 0124  NA 142 140 140  K 5.1 4.4 4.1  CL 108 107  109  CO2 '27 26 25  '$ GLUCOSE 83 104* 110*  BUN 15 20 25*  CREATININE 1.06* 1.09* 0.90  CALCIUM 9.3 8.8* 8.1*  MG  --   --  2.2  PHOS  --   --  4.5   Liver Function Tests: Recent Labs  Lab 04/16/22 0000 04/17/22 1508 04/18/22 0124  AST 13 15 13*  ALT '8 10 10  '$ ALKPHOS  --  76 65  BILITOT 0.3 0.4 0.8  PROT 5.9* 6.3* 5.2*  ALBUMIN  --  3.9 3.4*   No results for input(s): "LIPASE", "AMYLASE" in the last 168 hours. No results for input(s): "AMMONIA" in the last 168 hours. CBC: Recent Labs  Lab 04/16/22 0000 04/17/22 1508 04/18/22 0124  WBC 5.4 4.7 4.6  NEUTROABS 3,132 3.1  --   HGB 7.2* 7.3* 7.5*  HCT 24.3* 25.7* 25.6*  MCV 83.5 86.8 86.8  PLT 294 234 223   Cardiac Enzymes: No results for input(s): "CKTOTAL", "CKMB", "CKMBINDEX", "TROPONINI" in the last 168 hours. BNP: Invalid input(s): "POCBNP" CBG: No results for input(s): "GLUCAP" in the last 168 hours. D-Dimer No results for input(s): "DDIMER" in the last 72 hours. Hgb A1c Recent Labs    04/16/22 0000  HGBA1C 5.5   Lipid Profile Recent Labs    04/16/22 0000  CHOL 120  HDL 45*  LDLCALC 56  TRIG 108  CHOLHDL 2.7   Thyroid function studies No results for input(s): "TSH", "T4TOTAL", "T3FREE", "THYROIDAB" in the last 72 hours.  Invalid input(s): "FREET3" Anemia work up Recent Labs    04/16/22 0000 04/17/22 1858  VITAMINB12  --  1,272*  FOLATE  --  25.2  FERRITIN 3* 3*  TIBC 406 434  IRON 27* 15*  RETICCTPCT  --  2.0   Urinalysis    Component Value Date/Time   COLORURINE YELLOW 07/04/2021 1010  APPEARANCEUR CLEAR 07/04/2021 1010   LABSPEC 1.015 07/04/2021 1010   PHURINE 5.5 07/04/2021 1010   GLUCOSEU NEGATIVE 07/04/2021 1010   HGBUR NEGATIVE 07/04/2021 1010   BILIRUBINUR NEGATIVE 02/11/2016 1604   KETONESUR NEGATIVE 07/04/2021 1010   PROTEINUR NEGATIVE 07/04/2021 1010   NITRITE NEGATIVE 07/04/2021 1010   LEUKOCYTESUR 2+ (A) 07/04/2021 1010   Sepsis Labs Recent Labs  Lab  04/16/22 0000 04/17/22 1508 04/18/22 0124  WBC 5.4 4.7 4.6   Microbiology No results found for this or any previous visit (from the past 240 hour(s)).   Time coordinating discharge: Over 30 minutes  SIGNED:   Shawna Clamp, MD  [facility] 04/18/2022, 1:22 PM Pager   If 7PM-7AM, please contact night-coverage

## 2022-04-20 ENCOUNTER — Telehealth: Payer: Self-pay | Admitting: Nurse Practitioner

## 2022-04-20 ENCOUNTER — Telehealth: Payer: Self-pay

## 2022-04-20 ENCOUNTER — Other Ambulatory Visit: Payer: Self-pay | Admitting: Nurse Practitioner

## 2022-04-20 ENCOUNTER — Other Ambulatory Visit: Payer: Self-pay | Admitting: Hematology and Oncology

## 2022-04-20 DIAGNOSIS — D500 Iron deficiency anemia secondary to blood loss (chronic): Secondary | ICD-10-CM

## 2022-04-20 DIAGNOSIS — S069XAA Unspecified intracranial injury with loss of consciousness status unknown, initial encounter: Secondary | ICD-10-CM

## 2022-04-20 NOTE — Telephone Encounter (Signed)
Spoke with the patient. Offered the plan for pre-visit on 04/22/22, labs 04/23/22 and procedure 04/24/22 with an arrival time of 7:00 am. Patient does not have a care partner. She asks for the 15 of this month. No [facility] schedule available. Appointment to see Dr Lorenso Courier 04/22/22 at 1:30. Previous colonoscopy 2019 with Dr Fuller Plan in her chart. She is fine with the change to Dr Lorenso Courier.

## 2022-04-20 NOTE — Telephone Encounter (Signed)
I would have her take it three times a week with food, not three times a day

## 2022-04-20 NOTE — Telephone Encounter (Signed)
Pt was discharged on 02/03, Couldn't get her in for her HOSP F/U until next Monday. and on her discharge after visit summary it has contradicting information: first page says to take her ferrous sulfate 3x a daily but in her med list from the hospital it says 1 tab daily 3x a week. Can a nurse call her with the correct directions for the pt?

## 2022-04-20 NOTE — Telephone Encounter (Signed)
-----   Message from Sharyn Creamer, MD sent at 04/18/2022  1:06 PM EST ----- [facility], this is patient that came in with IDA to the hospital. Let's see if she can come in this Friday 2/9 at 7:30 AM to get an EGD and colonoscopy at the [facility]. She would need to hold her Eliquis for 2 days beforehand. If she agrees to a procedure this Friday, then have her come in for a CBC check this Thursday 2/8. If not, then let's get her in for a clinic follow up at next available time slot with me or APP to check her blood counts at this time. Please ensure that she gets a follow up with Dr. Lindi Adie to get IV iron.

## 2022-04-21 ENCOUNTER — Telehealth: Payer: Self-pay | Admitting: Adult Health

## 2022-04-21 NOTE — Telephone Encounter (Signed)
Scheduled appointment per 2/5 staff message. Left voicemail.

## 2022-04-22 ENCOUNTER — Encounter: Payer: Self-pay | Admitting: Internal Medicine

## 2022-04-22 ENCOUNTER — Ambulatory Visit (INDEPENDENT_AMBULATORY_CARE_PROVIDER_SITE_OTHER): Payer: PPO | Admitting: Internal Medicine

## 2022-04-22 ENCOUNTER — Other Ambulatory Visit (INDEPENDENT_AMBULATORY_CARE_PROVIDER_SITE_OTHER): Payer: PPO

## 2022-04-22 VITALS — BP 130/80 | HR 70 | Ht 65.0 in | Wt 183.0 lb

## 2022-04-22 DIAGNOSIS — D509 Iron deficiency anemia, unspecified: Secondary | ICD-10-CM | POA: Diagnosis not present

## 2022-04-22 LAB — CBC
HCT: 29.3 % — ABNORMAL LOW (ref 36.0–46.0)
Hemoglobin: 8.9 g/dL — ABNORMAL LOW (ref 12.0–15.0)
MCHC: 30.2 g/dL (ref 30.0–36.0)
MCV: 81.2 fl (ref 78.0–100.0)
Platelets: 209 10*3/uL (ref 150.0–400.0)
RBC: 3.61 Mil/uL — ABNORMAL LOW (ref 3.87–5.11)
RDW: 18.1 % — ABNORMAL HIGH (ref 11.5–15.5)
WBC: 6.3 10*3/uL (ref 4.0–10.5)

## 2022-04-22 NOTE — Patient Instructions (Signed)
Your provider has requested that you go to the basement level for lab work before leaving today. Press "B" on the elevator. The lab is located at the first door on the left as you exit the elevator.  You have been scheduled for an endoscopy and colonoscopy. Please follow the written instructions given to you at your visit today. Please pick up your prep supplies at the pharmacy within the next 1-3 days. If you use inhalers (even only as needed), please bring them with you on the day of your procedure.  _______________________________________________________  If your blood pressure at your visit was 140/90 or greater, please contact your primary care physician to follow up on this.  _______________________________________________________  If you are age 81 or older, your body mass index should be between 23-30. Your Body mass index is 30.45 kg/m. If this is out of the aforementioned range listed, please consider follow up with your Primary Care Provider.  If you are age 55 or younger, your body mass index should be between 19-25. Your Body mass index is 30.45 kg/m. If this is out of the aformentioned range listed, please consider follow up with your Primary Care Provider.   ________________________________________________________  The [facility] providers would like to encourage you to use [facility] to communicate with providers for non-urgent requests or questions.  Due to long hold times on the telephone, sending your provider a message by [facility] may be a faster and more efficient way to get a response.  Please allow 48 business hours for a response.  Please remember that this is for non-urgent requests.  _______________________________________________________  Thank you for choosing me and [facility].  Dr.Ying Lorenso Courier

## 2022-04-22 NOTE — Progress Notes (Signed)
Chief Complaint: IDA  HPI : 82 year old female with history of PE on Eliquis, A-fib, GERD, hiatal hernia, vertigo,arthritis presents with IDA  She was recently admitted for weakness on 2/2-04/18/22 when she was found to have IDA with a drop in her Hb from 11.8 3 months ago to 7.2. She received 1 U PRBC and was instructed upon discharge to take PPI BID and daily iron supplement. She has been doing slightly better since she was discharged. She thinks that her weakness and lightheadedness has improved slightly. She still denies N&V, ab pain, hematochezia, and melena. She did restart her Eliquis after her hospitalization but in preparation for EGD/colonoscopy tomorrow, her last dose of Eliquis was yesterday morning.    Past Medical History:  Diagnosis Date   Anemia    Arthritis    Benign neoplasm of sigmoid colon    Deafness    left ear per pt    Dyspnea    pt reports with exertion due to hip and knees    Dysrhythmia    afib    History of bleeding ulcers    2019 received 3-4 units of blood    Hyperlipidemia    Hypertension    Pericardial effusion 02/04/2018   Pre-diabetes    per Dr Einar Gip note of 09/28/19 pt denies at preop visit on 10/06/19    Prediabetes    Primary localized osteoarthritis of right hip 09/26/2019   Pulmonary embolism ([place])    hx of 2019    Vertigo    Vitamin D deficiency      Past Surgical History:  Procedure Laterality Date   BIOPSY  02/05/2018   Procedure: BIOPSY;  Surgeon: Ladene Artist, MD;  Location: [facility];  Service: Endoscopy;;   CATARACT EXTRACTION W/ INTRAOCULAR LENS  IMPLANT, BILATERAL  2009   CEREBRAL ANEURYSM REPAIR     COLONOSCOPY N/A 02/05/2018   Procedure: COLONOSCOPY;  Surgeon: Ladene Artist, MD;  Location: [facility];  Service: Endoscopy;  Laterality: N/A;   ESOPHAGOGASTRODUODENOSCOPY N/A 02/05/2018   Procedure: ESOPHAGOGASTRODUODENOSCOPY (EGD);  Surgeon: Ladene Artist, MD;  Location: [facility];  Service: Endoscopy;   Laterality: N/A;   FOOT SURGERY  1993   POLYPECTOMY  02/05/2018   Procedure: POLYPECTOMY;  Surgeon: Ladene Artist, MD;  Location: [facility];  Service: Endoscopy;;   TOTAL HIP ARTHROPLASTY Right 10/17/2019   Procedure: TOTAL HIP ARTHROPLASTY ANTERIOR APPROACH;  Surgeon: Renette Butters, MD;  Location: [facility];  Service: Orthopedics;  Laterality: Right;   TUBAL LIGATION     Family History  Problem Relation Age of Onset   Hypertension Mother    Diabetes Mother    Cancer Mother        Pancreatic   Cancer Father        Skin   Colon cancer Neg Hx    Stomach cancer Neg Hx    Esophageal cancer Neg Hx    Social History   Tobacco Use   Smoking status: Former    Types: Cigarettes    Quit date: 08/07/1990    Years since quitting: 31.7   Smokeless tobacco: Never  Vaping Use   Vaping Use: Never used  Substance Use Topics   Alcohol use: No    Alcohol/week: 0.0 standard drinks of alcohol   Drug use: No   Current Outpatient Medications  Medication Sig Dispense Refill   acetaminophen (TYLENOL) 500 MG tablet Take 500-1,000 mg by mouth every 6 (six) hours as needed for mild pain  or moderate pain.     apixaban (ELIQUIS) 5 MG TABS tablet Take  1 tablet  2 x /day  to Prevent Blood Clots / Patient knows to take by mouth (Patient taking differently: Take 5 mg by mouth in the morning and at bedtime.) 180 tablet 3   Cholecalciferol (VITAMIN D3) 1000 units CAPS Take 1,000-4,000 Units by mouth daily.     diazepam (VALIUM) 5 MG tablet TAKE 1/2-1 TABLET BY MOUTH 2 TO 3 TIMES DAILY FOR DIZZINESS/VERTIGO 30 tablet 0   ferrous sulfate 325 (65 FE) MG tablet Take 1 tablet (325 mg total) by mouth See admin instructions. Take 325 mg by mouth three times a week with food 30 tablet 3   gabapentin (NEURONTIN) 300 MG capsule Take  1 capsule  3 x /day for Nerve Pain                                                  /                    TAKE                           BY                     MOUTH (Patient taking  differently: Take 600 mg by mouth at bedtime.) 270 capsule 3   metoprolol tartrate (LOPRESSOR) 25 MG tablet Take  1 tablet  Daily  for BP & Heart (Patient taking differently: Take 25 mg by mouth at bedtime.) 90 tablet 3   pantoprazole (PROTONIX) 40 MG tablet Take 1 tablet (40 mg total) by mouth 2 (two) times daily. Take  1 tablet  Daily  to Prevent Heartburn & Indigestion 90 tablet 3   No current facility-administered medications for this visit.   No Known Allergies   Review of Systems: All systems reviewed and negative except where noted in HPI.   Physical Exam: BP 130/80   Pulse 70   Ht '5\' 5"'$  (1.651 m)   Wt 183 lb (83 kg)   BMI 30.45 kg/m  Constitutional: Pleasant,well-developed, female in no acute distress. HEENT: Normocephalic and atraumatic. Conjunctivae are normal. No scleral icterus. Cardiovascular: Normal rate, regular rhythm.  Pulmonary/chest: Effort normal and breath sounds normal. No wheezing, rales or rhonchi. Abdominal: Soft, nondistended, nontender. Bowel sounds active throughout. There are no masses palpable. No hepatomegaly. Extremities: No edema Neurological: Alert and oriented to person place and time. Skin: Skin is warm and dry. No rashes noted. Psychiatric: Normal mood and affect. Behavior is normal.  Labs 04/2021: Last CBC with low Hb of 7.5. CMP unremarkable. Ferritin low at 3.  EGD 02/05/18:  Path: 2. Esophagus, biopsy - SQUAMOCOLUMNAR ESOPHAGEAL MUCOSA, ULCERATED AND INFLAMED WITH GRANULATION TISSUE AND NECROINFLAMMATORY DEBRIS. - NEGATIVE FOR INTESTINAL (GOBLET CELL) METAPLASIA. Microscopic Comment 2. Special PAS-F stain is negative for fungal elements. Immunohistochemistry for HSV and CMV is negative.   Colonoscopy 02/05/18:  Path: 1. Colon, polyp(s), sigmoid - HYPERPLASTIC POLYP.  ASSESSMENT AND PLAN: IDA History of esophagitis History of hiatal hernia GERD PE and A-fib on Eliquis Patient was recently hospitalized with symptomatic anemia  and was found to have a drop from 11.8 in 01/2022 to 7.2. She has no gross evidence of bleeding. Ferritin was low  at 3, confirming IDA. It is suspected that the most likely source of her IDA is esophagitis and/or [name], which were visualized on her last EGD in 2019. Will plan for EGD and colonoscopy for further evaluation of a source of her IDA and to rule out malignancy. - Check CBC to ensure that it has not dropped significantly - Continue daily iron supplement - Continue PPI BID - EGD/colonoscopy [facility] tomorrow. Last dose of Eliquis was yesterday morning.   Christia Reading, MD  I spent 51 minutes of time, including in depth chart review, independent review of results as outlined above, communicating results with the patient directly, face-to-face time with the patient, coordinating care, ordering studies and medications as appropriate, and documentation.

## 2022-04-23 ENCOUNTER — Encounter: Payer: Self-pay | Admitting: Internal Medicine

## 2022-04-23 ENCOUNTER — Ambulatory Visit (AMBULATORY_SURGERY_CENTER): Payer: PPO | Admitting: Internal Medicine

## 2022-04-23 VITALS — BP 157/75 | HR 76 | Temp 97.1°F | Resp 19 | Ht 65.0 in | Wt 183.0 lb

## 2022-04-23 DIAGNOSIS — K319 Disease of stomach and duodenum, unspecified: Secondary | ICD-10-CM | POA: Diagnosis not present

## 2022-04-23 DIAGNOSIS — K2101 Gastro-esophageal reflux disease with esophagitis, with bleeding: Secondary | ICD-10-CM | POA: Diagnosis not present

## 2022-04-23 DIAGNOSIS — D120 Benign neoplasm of cecum: Secondary | ICD-10-CM

## 2022-04-23 DIAGNOSIS — I10 Essential (primary) hypertension: Secondary | ICD-10-CM | POA: Diagnosis not present

## 2022-04-23 DIAGNOSIS — D125 Benign neoplasm of sigmoid colon: Secondary | ICD-10-CM | POA: Diagnosis not present

## 2022-04-23 DIAGNOSIS — D509 Iron deficiency anemia, unspecified: Secondary | ICD-10-CM | POA: Diagnosis not present

## 2022-04-23 DIAGNOSIS — R7303 Prediabetes: Secondary | ICD-10-CM | POA: Diagnosis not present

## 2022-04-23 MED ORDER — SODIUM CHLORIDE 0.9 % IV SOLN: 500.0000 mL | INTRAVENOUS | Status: DC

## 2022-04-23 NOTE — Progress Notes (Signed)
Report to pacu rn. Vss. Care resumed by rn. 

## 2022-04-23 NOTE — Progress Notes (Signed)
Pt's states no medical or surgical changes since previsit or office visit. 

## 2022-04-23 NOTE — Progress Notes (Signed)
Called to room to assist during endoscopic procedure.  Patient ID and intended procedure confirmed with present staff. Received instructions for my participation in the procedure from the performing physician.  

## 2022-04-23 NOTE — Op Note (Signed)
[place] Patient Name: Lori [name] Procedure Date: 04/23/2022 9:52 AM MRN: [id number] Endoscopist: Adline Mango [place] , , [id number] Age: 82 Referring MD:  Date of Birth: 10/12/1940 Gender: Female Account #: [id number] Procedure:                Upper GI endoscopy Indications:              Iron deficiency anemia Medicines:                Monitored Anesthesia Care Procedure:                Pre-Anesthesia Assessment:                           - Prior to the procedure, a History and Physical                            was performed, and patient medications and                            allergies were reviewed. The patient's tolerance of                            previous anesthesia was also reviewed. The risks                            and benefits of the procedure and the sedation                            options and risks were discussed with the patient.                            All questions were answered, and informed consent                            was obtained. Prior Anticoagulants: The patient has                            taken Eliquis (apixaban), last dose was 2 days                            prior to procedure. ASA Grade Assessment: II - A                            patient with mild systemic disease. After reviewing                            the risks and benefits, the patient was deemed in                            satisfactory condition to undergo the procedure.                           After obtaining informed consent, the endoscope was  passed under direct vision. Throughout the                            procedure, the patient's blood pressure, pulse, and                            oxygen saturations were monitored continuously. The                            GIF [id number] #[id number] was introduced through the                            mouth, and advanced to the second part of duodenum. Scope In: Scope Out: Findings:                  LA Grade A (one or more mucosal breaks less than 5                            mm, not extending between tops of 2 mucosal folds)                            esophagitis with bleeding was found in the distal                            esophagus.                           A large hiatal hernia was present.                           Localized mild inflammation characterized by                            congestion (edema), erosions and erythema was found                            in the gastric body and in the gastric antrum.                            Biopsies were taken with a cold forceps for                            histology.                           The examined duodenum was normal. Complications:            No immediate complications. Estimated Blood Loss:     Estimated blood loss was minimal. Impression:               - LA Grade A reflux esophagitis with bleeding.                           - Large hiatal hernia.                           -  Gastritis. Biopsied.                           - Normal examined duodenum. Recommendation:           - Await pathology results.                           - Continue pantoprazole 40 mg BID.                           - Perform a colonoscopy today. Dr Georgian Co "Lyndee Leo" Leonard,  04/23/2022 10:40:23 AM

## 2022-04-23 NOTE — Progress Notes (Signed)
GASTROENTEROLOGY PROCEDURE H&P NOTE   Primary Care Physician: Unk Pinto, MD    Reason for Procedure:   IDA  Plan:    EGD/colonoscopy  Patient is appropriate for endoscopic procedure(s) in the ambulatory ([place]) setting.  The nature of the procedure, as well as the risks, benefits, and alternatives were carefully and thoroughly reviewed with the patient. Ample time for discussion and questions allowed. The patient understood, was satisfied, and agreed to proceed.     HPI: Lori [name] is a 82 y.o. female who presents for EGD/colonoscopy for evaluation of IDA .  Patient was most recently seen in the Gastroenterology Clinic on 04/22/22.  No interval change in medical history since that appointment. Please refer to that note for full details regarding GI history and clinical presentation.   Past Medical History:  Diagnosis Date   Anemia    Arthritis    Benign neoplasm of sigmoid colon    Deafness    left ear per pt    Dyspnea    pt reports with exertion due to hip and knees    Dysrhythmia    afib    History of bleeding ulcers    2019 received 3-4 units of blood    Hyperlipidemia    Hypertension    Pericardial effusion 02/04/2018   Pre-diabetes    per Dr Einar Gip note of 09/28/19 pt denies at preop visit on 10/06/19    Prediabetes    Primary localized osteoarthritis of right hip 09/26/2019   Pulmonary embolism ([place])    hx of 2019    Vertigo    Vitamin D deficiency     Past Surgical History:  Procedure Laterality Date   BIOPSY  02/05/2018   Procedure: BIOPSY;  Surgeon: Ladene Artist, MD;  Location: [facility];  Service: Endoscopy;;   CATARACT EXTRACTION W/ INTRAOCULAR LENS  IMPLANT, BILATERAL  2009   CEREBRAL ANEURYSM REPAIR     COLONOSCOPY N/A 02/05/2018   Procedure: COLONOSCOPY;  Surgeon: Ladene Artist, MD;  Location: [facility];  Service: Endoscopy;  Laterality: N/A;   ESOPHAGOGASTRODUODENOSCOPY N/A 02/05/2018   Procedure: ESOPHAGOGASTRODUODENOSCOPY  (EGD);  Surgeon: Ladene Artist, MD;  Location: [facility];  Service: Endoscopy;  Laterality: N/A;   FOOT SURGERY  1993   POLYPECTOMY  02/05/2018   Procedure: POLYPECTOMY;  Surgeon: Ladene Artist, MD;  Location: [facility];  Service: Endoscopy;;   TOTAL HIP ARTHROPLASTY Right 10/17/2019   Procedure: TOTAL HIP ARTHROPLASTY ANTERIOR APPROACH;  Surgeon: Renette Butters, MD;  Location: [facility];  Service: Orthopedics;  Laterality: Right;   TUBAL LIGATION      Prior to Admission medications   Medication Sig Start Date End Date Taking? Authorizing Provider  acetaminophen (TYLENOL) 500 MG tablet Take 500-1,000 mg by mouth every 6 (six) hours as needed for mild pain or moderate pain.    [provider]  apixaban (ELIQUIS) 5 MG TABS tablet Take  1 tablet  2 x /day  to Prevent Blood Clots / Patient knows to take by mouth Patient taking differently: Take 5 mg by mouth in the morning and at bedtime. 06/03/21   Unk Pinto, MD  Cholecalciferol (VITAMIN D3) 1000 units CAPS Take 1,000-4,000 Units by mouth daily.    [provider]  diazepam (VALIUM) 5 MG tablet TAKE 1/2-1 TABLET BY MOUTH 2 TO 3 TIMES DAILY FOR DIZZINESS/VERTIGO 04/20/22   Darrol Jump, NP  ferrous sulfate 325 (65 FE) MG tablet Take 1 tablet (325 mg total) by mouth  See admin instructions. Take 325 mg by mouth three times a week with food 04/18/22   Sharyn Creamer, MD  gabapentin (NEURONTIN) 300 MG capsule Take  1 capsule  3 x /day for Nerve Pain                                                  /                    TAKE                           BY                     MOUTH Patient taking differently: Take 600 mg by mouth at bedtime. 07/04/21   Unk Pinto, MD  metoprolol tartrate (LOPRESSOR) 25 MG tablet Take  1 tablet  Daily  for BP & Heart Patient taking differently: Take 25 mg by mouth at bedtime. 07/04/21   Unk Pinto, MD  pantoprazole (PROTONIX) 40 MG tablet Take 1 tablet (40 mg total) by mouth 2 (two)  times daily. Take  1 tablet  Daily  to Prevent Heartburn & Indigestion 04/18/22   Shawna Clamp, MD    Current Outpatient Medications  Medication Sig Dispense Refill   acetaminophen (TYLENOL) 500 MG tablet Take 500-1,000 mg by mouth every 6 (six) hours as needed for mild pain or moderate pain.     apixaban (ELIQUIS) 5 MG TABS tablet Take  1 tablet  2 x /day  to Prevent Blood Clots / Patient knows to take by mouth (Patient taking differently: Take 5 mg by mouth in the morning and at bedtime.) 180 tablet 3   Cholecalciferol (VITAMIN D3) 1000 units CAPS Take 1,000-4,000 Units by mouth daily.     diazepam (VALIUM) 5 MG tablet TAKE 1/2-1 TABLET BY MOUTH 2 TO 3 TIMES DAILY FOR DIZZINESS/VERTIGO 30 tablet 0   ferrous sulfate 325 (65 FE) MG tablet Take 1 tablet (325 mg total) by mouth See admin instructions. Take 325 mg by mouth three times a week with food 30 tablet 3   gabapentin (NEURONTIN) 300 MG capsule Take  1 capsule  3 x /day for Nerve Pain                                                  /                    TAKE                           BY                     MOUTH (Patient taking differently: Take 600 mg by mouth at bedtime.) 270 capsule 3   metoprolol tartrate (LOPRESSOR) 25 MG tablet Take  1 tablet  Daily  for BP & Heart (Patient taking differently: Take 25 mg by mouth at bedtime.) 90 tablet 3   pantoprazole (PROTONIX) 40 MG tablet Take 1 tablet (40 mg total) by mouth 2 (two) times daily. Take  1  tablet  Daily  to Prevent Heartburn & Indigestion 90 tablet 3   Current Facility-Administered Medications  Medication Dose Route Frequency Provider Last Rate Last Admin   0.9 %  sodium chloride infusion  500 mL Intravenous Continuous Sharyn Creamer, MD        Allergies as of 04/23/2022   (No Known Allergies)    Family History  Problem Relation Age of Onset   Hypertension Mother    Diabetes Mother    Cancer Mother        Pancreatic   Cancer Father        Skin   Colon cancer Neg Hx     Stomach cancer Neg Hx    Esophageal cancer Neg Hx     Social History   Socioeconomic History   Marital status: Divorced    Spouse name: Not on file   Number of children: 1   Years of education: 12   Highest education level: Not on file  Occupational History   Occupation: retired  Tobacco Use   Smoking status: Former    Types: Cigarettes    Quit date: 08/07/1990    Years since quitting: 31.7   Smokeless tobacco: Never  Vaping Use   Vaping Use: Never used  Substance and Sexual Activity   Alcohol use: No    Alcohol/week: 0.0 standard drinks of alcohol   Drug use: No   Sexual activity: Not on file  Other Topics Concern   Not on file  Social History Narrative   Not on file   Social Determinants of Health   Financial Resource Strain: Not on file  Food Insecurity: No Food Insecurity (04/17/2022)   Hunger Vital Sign    Worried About Running Out of Food in the Last Year: Never true    [place] in the Last Year: Never true  Transportation Needs: No Transportation Needs (04/17/2022)   PRAPARE - Hydrologist (Medical): No    Lack of Transportation (Non-Medical): No  Physical Activity: Not on file  Stress: Not on file  Social Connections: Not on file  Intimate Partner Violence: Not At Risk (04/17/2022)   Humiliation, Afraid, Rape, and Kick questionnaire    Fear of Current or Ex-Partner: No    Emotionally Abused: No    Physically Abused: No    Sexually Abused: No    Physical Exam: Vital signs in last 24 hours: BP (!) 159/78   Pulse 72   Temp (!) 97.1 F (36.2 C) (Temporal)   Ht '5\' 5"'$  (1.651 m)   Wt 183 lb (83 kg)   SpO2 97%   BMI 30.45 kg/m  GEN: NAD EYE: Sclerae anicteric ENT: MMM CV: Non-tachycardic Pulm: No increased WOB GI: Soft NEURO:  Alert & Oriented   Christia Reading, MD [facility]   04/23/2022 9:14 AM

## 2022-04-23 NOTE — Op Note (Signed)
[facility] Patient Name: Lori [name] Procedure Date: 04/23/2022 9:51 AM MRN: [id number] Endoscopist: Adline Mango [place] , , [id number] Age: 82 Referring MD:  Date of Birth: [birth year] Gender: Female Account #: [id number] Procedure:                Colonoscopy Indications:              Iron deficiency anemia Medicines:                Monitored Anesthesia Care Procedure:                Pre-Anesthesia Assessment:                           - Prior to the procedure, a History and Physical                            was performed, and patient medications and                            allergies were reviewed. The patient's tolerance of                            previous anesthesia was also reviewed. The risks                            and benefits of the procedure and the sedation                            options and risks were discussed with the patient.                            All questions were answered, and informed consent                            was obtained. Prior Anticoagulants: The patient has                            taken Eliquis (apixaban), last dose was 2 days                            prior to procedure. ASA Grade Assessment: II - A                            patient with mild systemic disease. After reviewing                            the risks and benefits, the patient was deemed in                            satisfactory condition to undergo the procedure.                           After obtaining informed consent, the colonoscope  was passed under direct vision. Throughout the                            procedure, the patient's blood pressure, pulse, and                            oxygen saturations were monitored continuously. The                            CF [id number] #[id number] was introduced through the anus                            and advanced to the the cecum, identified by                            appendiceal  orifice and ileocecal valve. The                            colonoscopy was performed without difficulty. The                            patient tolerated the procedure well. The quality                            of the bowel preparation was adequate. The                            ileocecal valve, appendiceal orifice, and rectum                            were photographed. Scope In: 10:05:16 AM Scope Out: 10:29:24 AM Scope Withdrawal Time: 0 hours 14 minutes 53 seconds  Total Procedure Duration: 0 hours 24 minutes 8 seconds  Findings:                 A 5 mm polyp was found in the cecum. The polyp was                            sessile. The polyp was removed with a cold snare.                            Resection and retrieval were complete.                           Multiple diverticula were found in the sigmoid                            colon.                           A 3 mm polyp was found in the sigmoid colon. The                            polyp was sessile. The polyp was removed with a  cold snare. Resection and retrieval were complete.                           Non-bleeding internal hemorrhoids were found during                            retroflexion. Complications:            No immediate complications. Estimated Blood Loss:     Estimated blood loss was minimal. Impression:               - One 5 mm polyp in the cecum, removed with a cold                            snare. Resected and retrieved.                           - Diverticulosis in the sigmoid colon.                           - One 3 mm polyp in the sigmoid colon, removed with                            a cold snare. Resected and retrieved.                           - Non-bleeding internal hemorrhoids. Recommendation:           - Discharge patient to home (with escort).                           - It is suspected that your anemia was due to                            bleeding from  esophagitis. Please take your                            pantoprazole therapy twice daily.                           - Okay to restart your Eliquis therapy tomorrow.                           - Await pathology results.                           - Return to GI clinic in 2 months.                           - The findings and recommendations were discussed                            with the patient. Dr Georgian Co "Rosenberg" Vardaman,  04/23/2022 10:44:08 AM

## 2022-04-23 NOTE — Patient Instructions (Addendum)
RESTART Eliquis therapy tomorrow  Continue pantoprazole 40 mg twice daily  Return to GI clinic in 2 months  Handouts Provided:  Polyps, Hiatal Hernia and Esophagitis  YOU HAD AN ENDOSCOPIC PROCEDURE TODAY AT [place]:   Refer to the procedure report that was given to you for any specific questions about what was found during the examination.  If the procedure report does not answer your questions, please call your gastroenterologist to clarify.  If you requested that your care partner not be given the details of your procedure findings, then the procedure report has been included in a sealed envelope for you to review at your convenience later.  YOU SHOULD EXPECT: Some feelings of bloating in the abdomen. Passage of more gas than usual.  Walking can help get rid of the air that was put into your GI tract during the procedure and reduce the bloating. If you had a lower endoscopy (such as a colonoscopy or flexible sigmoidoscopy) you may notice spotting of blood in your stool or on the toilet paper. If you underwent a bowel prep for your procedure, you may not have a normal bowel movement for a few days.  Please Note:  You might notice some irritation and congestion in your nose or some drainage.  This is from the oxygen used during your procedure.  There is no need for concern and it should clear up in a day or so.  SYMPTOMS TO REPORT IMMEDIATELY:  Following lower endoscopy (colonoscopy or flexible sigmoidoscopy):  Excessive amounts of blood in the stool  Significant tenderness or worsening of abdominal pains  Swelling of the abdomen that is new, acute  Fever of 100F or higher  Following upper endoscopy (EGD)  Vomiting of blood or coffee ground material  New chest pain or pain under the shoulder blades  Painful or persistently difficult swallowing  New shortness of breath  Fever of 100F or higher  Black, tarry-looking stools  For urgent or emergent issues, a  gastroenterologist can be reached at any hour by calling [phone]. Do not use MyChart messaging for urgent concerns.    DIET:  We do recommend a small meal at first, but then you may proceed to your regular diet.  Drink plenty of fluids but you should avoid alcoholic beverages for 24 hours.  ACTIVITY:  You should plan to take it easy for the rest of today and you should NOT DRIVE or use heavy machinery until tomorrow (because of the sedation medicines used during the test).    FOLLOW UP: Our staff will call the number listed on your records the next business day following your procedure.  We will call around 7:15- 8:00 am to check on you and address any questions or concerns that you may have regarding the information given to you following your procedure. If we do not reach you, we will leave a message.     If any biopsies were taken you will be contacted by phone or by letter within the next 1-3 weeks.  Please call us at [phone] if you have not heard about the biopsies in 3 weeks.    SIGNATURES/CONFIDENTIALITY: You and/or your care partner have signed paperwork which will be entered into your electronic medical record.  These signatures attest to the fact that that the information above on your After Visit Summary has been reviewed and is understood.  Full responsibility of the confidentiality of this discharge information lies with you and/or your care-partner.

## 2022-04-24 ENCOUNTER — Telehealth: Payer: Self-pay | Admitting: *Deleted

## 2022-04-24 ENCOUNTER — Telehealth: Payer: Self-pay

## 2022-04-24 ENCOUNTER — Ambulatory Visit (INDEPENDENT_AMBULATORY_CARE_PROVIDER_SITE_OTHER): Payer: Medicare Other | Admitting: Physician Assistant

## 2022-04-24 ENCOUNTER — Other Ambulatory Visit: Payer: Self-pay

## 2022-04-24 ENCOUNTER — Encounter (INDEPENDENT_AMBULATORY_CARE_PROVIDER_SITE_OTHER): Payer: Self-pay

## 2022-04-24 VITALS — BP 110/64 | HR 62 | Ht 65.0 in | Wt 171.0 lb

## 2022-04-24 DIAGNOSIS — I219 Acute myocardial infarction, unspecified: Secondary | ICD-10-CM | POA: Insufficient documentation

## 2022-04-24 DIAGNOSIS — Z789 Other specified health status: Secondary | ICD-10-CM

## 2022-04-24 DIAGNOSIS — M1990 Unspecified osteoarthritis, unspecified site: Secondary | ICD-10-CM

## 2022-04-24 DIAGNOSIS — I739 Peripheral vascular disease, unspecified: Secondary | ICD-10-CM

## 2022-04-24 DIAGNOSIS — E119 Type 2 diabetes mellitus without complications: Secondary | ICD-10-CM

## 2022-04-24 DIAGNOSIS — I491 Atrial premature depolarization: Secondary | ICD-10-CM | POA: Insufficient documentation

## 2022-04-24 DIAGNOSIS — I10 Essential (primary) hypertension: Secondary | ICD-10-CM

## 2022-04-24 DIAGNOSIS — Z973 Presence of spectacles and contact lenses: Secondary | ICD-10-CM | POA: Insufficient documentation

## 2022-04-24 DIAGNOSIS — Z955 Presence of coronary angioplasty implant and graft: Secondary | ICD-10-CM

## 2022-04-24 DIAGNOSIS — I440 Atrioventricular block, first degree: Secondary | ICD-10-CM

## 2022-04-24 DIAGNOSIS — I252 Old myocardial infarction: Secondary | ICD-10-CM

## 2022-04-24 DIAGNOSIS — E782 Mixed hyperlipidemia: Secondary | ICD-10-CM

## 2022-04-24 DIAGNOSIS — R0989 Other specified symptoms and signs involving the circulatory and respiratory systems: Secondary | ICD-10-CM

## 2022-04-24 DIAGNOSIS — I2510 Atherosclerotic heart disease of native coronary artery without angina pectoris: Secondary | ICD-10-CM

## 2022-04-24 DIAGNOSIS — R55 Syncope and collapse: Secondary | ICD-10-CM | POA: Insufficient documentation

## 2022-04-24 DIAGNOSIS — G4733 Obstructive sleep apnea (adult) (pediatric): Secondary | ICD-10-CM

## 2022-04-24 DIAGNOSIS — K219 Gastro-esophageal reflux disease without esophagitis: Secondary | ICD-10-CM

## 2022-04-24 NOTE — Procedures (Signed)
[place], [place]  [address] [place] [id number]    Procedure Note    Name: Sheketa Guthery MRN:  [id number]   Date: 04/24/2022 Age: 82 y.o.  DOB:   1940/11/08       ECG - In Clinic    Performed by: Freda Munro, PA-C  Authorized by: Freda Munro, PA-C      Sinus rhythm, borderline first-degree AV block, insignificant inferior ST elevation.Marland Kitchen    Freda Munro, PA-C

## 2022-04-24 NOTE — Progress Notes (Signed)
**Note Melissa-Identified via Obfuscation** Cardiology [place], [facility]  [address] [place] [address]  [phone]    Cardiology  Clinic Note    Name: Melissa Wolf   DOB: 1940/04/08  [81 y.o. female]   MRN: [id number]       Visit Date: 04/24/2022   Referring: Melissa Burrs, DO  [place]  [address] [place],  [place] [address]   PCP: Melissa Burrs, DO         Chief Complaint: Hospital Follow Up (PCI/Stent @ [facility] 03/06/22/), Heart Disease, Hypertension, Hyperlipidemia, and Diabetes      History of Present Illness   Many [name] is a 82 y.o. White female who presents for a hospital follow-up visit.  She has a history of coronary artery disease and had catheter intervention of the circumflex 2010 and the LAD in 2016.  Unstable angina/non ST segment elevated MI prompted catheterization 03/05/2022 finding severe progression of coronary disease with chronic occlusion of the circumflex with collateral filling with severe LAD and severe right coronary stenosis with inferolateral hypokinesis on nuclear testing with 50% ejection fraction.  Dr. Delia Chimes discussed the findings and patient with Cardiothoracic surgery.  She was taken back to the lab 03/06/2022 and underwent successful "last resort" catheter intervention of the left main/ostial LAD with Synergy drug-eluting stent and 3 lesions in the right coronary with 3 synergy drug-eluting stents.  The circumflex was not amenable to catheter intervention.  She tolerated the procedure well.    Her primary complaint today is some shortness of breath with over exertion but significant improvement since prior to catheter intervention.  She has occasional dizziness but no presyncope or syncope.  She denies chest pain, orthopnea, PND, palpitations, or edema.    EKG today:  Sinus rhythm with borderline first-degree AV block and insignificant inferior ST elevation.    Patient Active Problem List    Diagnosis  Date Noted    Atrial ectopy 04/24/2022    Diabetes mellitus, type 2 (CMS [name]) 04/24/2022    GERD (gastroesophageal reflux disease) 04/24/2022    Myocardial infarction (CMS [name]) 04/24/2022    OA (osteoarthritis) 04/24/2022    OSA (obstructive sleep apnea) 04/24/2022    PAD (peripheral artery disease) (CMS [name]) 04/24/2022    Syncope and collapse 04/24/2022    Wears glasses 04/24/2022    Right carotid bruit 04/24/2022    Nonsmoker 04/24/2022    Chest discomfort 02/23/2022    History of coronary artery stent placement 02/23/2022    Shortness of breath 02/23/2022    DM (diabetes mellitus) (CMS [place]) 02/23/2022    OSA on CPAP 02/23/2022    HTN (hypertension) 02/20/2022    Hyperlipidemia 02/20/2022    CAD (coronary artery disease) 02/20/2022       Allergies  Allergies   Allergen Reactions    Codeine      Pt doesn't remember exact rxn       Medications    Current Outpatient Medications:     aspirin 81 mg Oral Tablet, Chewable, Chew 1 Tablet (81 mg total) Twice daily, Disp: , Rfl:     atorvastatin (LIPITOR) 40 mg Oral Tablet, Take 1 Tablet (40 mg total) by mouth Every evening, Disp: 90 Tablet, Rfl: 3    cholecalciferol, vitamin D3, 25 mcg (1,000 unit) Oral Tablet, Take 1 Tablet (1,000 Units total) by mouth Once a day, Disp: , Rfl:     clopidogreL (PLAVIX) 75 mg Oral  Tablet, Take 1 Tablet (75 mg total) by mouth Once a day, Disp: 90 Tablet, Rfl: 3    insulin aspart (NOVOLOG) 100 unit/mL Subcutaneous Insulin Pen, Inject 0-12 Units under the skin Four times a day - before meals and bedtime, Disp: , Rfl:     insulin detemir (LEVEMIR FLEXPEN SUBQ), Inject 40 Units under the skin, Disp: , Rfl:     insulin glargine (LANTUS U-100 INSULIN) 100 unit/mL Subcutaneous injection (vial), , Disp: , Rfl:     isosorbide mononitrate (IMDUR) 60 mg Oral Tablet Sustained Release 24 hr, Take 1 Tablet (60 mg total) by mouth Once a day, Disp: 90 Tablet, Rfl: 3    JARDIANCE 10 mg Oral Tablet, Take 1 Tablet (10 mg total) by mouth Once a day, Disp: ,  Rfl:     magnesium oxide (MAG-OX) 400 mg Oral Tablet, Take 1 Tablet (400 mg total) by mouth Twice daily, Disp: , Rfl:     MetFORMIN (GLUCOPHAGE) 1,000 mg Oral Tablet, Take 1 Tablet (1,000 mg total) by mouth Twice daily with food, Disp: , Rfl:     metoprolol succinate (TOPROL-XL) 25 mg Oral Tablet Sustained Release 24 hr, Take 1 Tablet (25 mg total) by mouth Once a day, Disp: 90 Tablet, Rfl: 3    nitroGLYCERIN (NITROSTAT) 0.4 mg Sublingual Tablet, Sublingual, Place 1 Tablet (0.4 mg total) under the tongue Every 5 minutes as needed for Chest pain for 3 doses over 15 minutes, Disp: 25 Tablet, Rfl: 3    ondansetron (ZOFRAN) 4 mg Oral Tablet, Take 1 Tablet (4 mg total) by mouth Every 8 hours as needed for Nausea/Vomiting, Disp: 20 Tablet, Rfl: 0    pantoprazole (PROTONIX) 40 mg Oral Tablet, Delayed Release (E.C.), Take 1 Tablet (40 mg total) by mouth Once a day, Disp: , Rfl:     sertraline (ZOLOFT) 25 mg Oral Tablet, Take 1 Tablet (25 mg total) by mouth Once a day, Disp: , Rfl:     syringe-needle,insulin,0.5 mL (INSULIN SYRINGE N/A), , Disp: , Rfl:     TRESIBA FLEXTOUCH U-100 100 unit/mL (3 mL) Subcutaneous Insulin Pen, INJECT 26 UNITS IN AM AND 30 UNITS IN PM SUBCUTANEOUSLY EVERY DAY AT BEDTIME, Disp: , Rfl:     History  Past Medical History:   Diagnosis Date    Atrial ectopy     CAD (coronary artery disease)     Diabetes mellitus, type 2 (CMS [name])     GERD (gastroesophageal reflux disease)     History of coronary artery stent placement     HTN (hypertension)     Hyperlipidemia     Myocardial infarction (CMS [name])     OA (osteoarthritis)     OSA (obstructive sleep apnea)     PAD (peripheral artery disease) (CMS [name])     Syncope and collapse     Wears glasses          Past Surgical History:   Procedure Laterality Date    HAND SURGERY      Plate in right hand from fx    HX CORONARY STENT PLACEMENT      HX HYSTERECTOMY      HX TONSILLECTOMY      SHOULDER SURGERY Bilateral      Social History     Socioeconomic History     Marital status: Widowed   Tobacco Use    Smoking status: Never    Smokeless tobacco: Never   Vaping Use    Vaping Use: Never used   Substance  and Sexual Activity    Alcohol use: Not Currently    Drug use: Never     Family Medical History:       Problem Relation (Age of Onset)    Coronary Artery Disease Sister    Heart Attack Sister              Review of Systems:  Constitutional: No significant weight gain or loss, fatigue or fever.  Respiratory:  Shortness of breath.  No emphysema, COPD, black lung, or sleep apnea  Cardiovascular: No chest pain, palpitations, orthopnea, PND, claudication or edema.  Gastrointestinal: No nausea, vomiting, diarrhea.  Endocrine:  Diabetes.  No thyroid problems  Musculoskeletal:  Arthritis.  No history of gout.  Neurologic:  Dizziness.  No history of strokes or seizures, presyncope or syncope.  Psychiatric:  Anxiety and depression      Physical Examination:  BP 110/64 (Site: Right, Patient Position: Sitting, Cuff Size: Adult Small)   Pulse 62   Ht 1.651 m (5' 5"$ )   Wt 77.6 kg (171 lb)   SpO2 98%   BMI 28.46 kg/m       General: Alert, no acute distress  Neck:  Right carotid bruit.  No JVD  Lungs: Clear to auscultation bilaterally, non-labored respiration.  Heart: Normal rate, regular rhythm, 2/6 systolic murmur RUSB.  Abdomen: Soft, non-tender.  Neurologic: No motor or sensory deficits.  Extremities:  No edema, normal peripheral pulse.        Orders Placed This Encounter    ECG - In Clinic    POCT LIPIDS    CAROTID ARTERY DUPLEX       Medications Discontinued During This Encounter   Medication Reason    SITagliptin phosphate (JANUVIA) 50 mg Oral Tablet Patient states no longer taking       Assessment and Plan:  Assessment/Plan   1. Coronary artery disease involving native coronary artery of native heart without angina pectoris    2. History of coronary artery stent placement    3. Old MI (myocardial infarction)    4. Right carotid bruit    5. Essential hypertension    6. Mixed  hyperlipidemia    7. Diabetes mellitus, type 2 (CMS [name])    8. Nonsmoker    9. PAD (peripheral artery disease) (CMS [name])    10. OSA (obstructive sleep apnea)    11. Osteoarthritis, unspecified osteoarthritis type, unspecified site    12. Gastroesophageal reflux disease, unspecified whether esophagitis present        Ms. Genthner has severe heart disease with recent complex catheter intervention of the left main/LAD and right coronary.  She is currently stable and has no anginal or anginal equivalent symptoms.  Blood pressure is well controlled.  We will continue her current medical regimen risk factor reduction.  She was noted to have a potentially significant right carotid bruit and will be scheduled for a bilateral carotid duplex.  She was offered a referral for cardiac rehabilitation but there are no programs in her area.  She was encouraged to engage in regular exercise.  We reviewed the importance of uninterrupted dual antiplatelet therapy after stent placement and she voiced good understanding.  She was asked to contact us or go to the emergency room if there are any changes in her cardiac status.  She will follow-up in 1 year or sooner if needed.    Thank you for allowing [place] to participate in care of your patient.  If we can be of further assistance  in her care at any time please let us know.      Sincerely,     Charlestine Massed, PA-C      Follow up:  Return in about 1 year (around 04/25/2023).        Freda Munro, PA-C  [facility]    A portion of this documentation may have been generated using [facility] voice recognition software and may contain syntax/voice recognition errors.

## 2022-04-24 NOTE — Telephone Encounter (Signed)
No answer, left message to call if having any issues or concerns, B.Lovada Barwick RN 

## 2022-04-24 NOTE — Telephone Encounter (Signed)
Patient returned call, states she is doing well, has no further questions at this time.

## 2022-04-27 ENCOUNTER — Encounter: Payer: Self-pay | Admitting: Hematology and Oncology

## 2022-04-27 ENCOUNTER — Encounter: Payer: Self-pay | Admitting: Nurse Practitioner

## 2022-04-27 ENCOUNTER — Ambulatory Visit (INDEPENDENT_AMBULATORY_CARE_PROVIDER_SITE_OTHER): Payer: PPO | Admitting: Nurse Practitioner

## 2022-04-27 VITALS — BP 152/80 | HR 68 | Temp 98.4°F | Ht 65.0 in | Wt 187.0 lb

## 2022-04-27 DIAGNOSIS — D509 Iron deficiency anemia, unspecified: Secondary | ICD-10-CM | POA: Diagnosis not present

## 2022-04-27 DIAGNOSIS — I482 Chronic atrial fibrillation, unspecified: Secondary | ICD-10-CM | POA: Diagnosis not present

## 2022-04-27 DIAGNOSIS — K2100 Gastro-esophageal reflux disease with esophagitis, without bleeding: Secondary | ICD-10-CM | POA: Diagnosis not present

## 2022-04-27 DIAGNOSIS — Z79899 Other long term (current) drug therapy: Secondary | ICD-10-CM

## 2022-04-27 DIAGNOSIS — S069XAA Unspecified intracranial injury with loss of consciousness status unknown, initial encounter: Secondary | ICD-10-CM

## 2022-04-27 DIAGNOSIS — R42 Dizziness and giddiness: Secondary | ICD-10-CM | POA: Diagnosis not present

## 2022-04-27 DIAGNOSIS — Z09 Encounter for follow-up examination after completed treatment for conditions other than malignant neoplasm: Secondary | ICD-10-CM

## 2022-04-27 MED ORDER — MECLIZINE HCL 25 MG PO TABS: ORAL_TABLET | ORAL | 0 refills | Status: DC

## 2022-04-27 NOTE — Patient Instructions (Signed)
Iron Deficiency Anemia, Adult  Iron deficiency anemia is a condition in which the concentration of red blood cells or hemoglobin in the blood is below normal because of too little iron. Hemoglobin is a substance in red blood cells that carries oxygen to the body's tissues. When the concentration of red blood cells or hemoglobin is too low, not enough oxygen reaches these tissues. Iron deficiency anemia is usually long-lasting, and it develops over time. It may or may not cause symptoms. It is a common type of anemia. What are the causes? This condition may be caused by: Not enough iron in the diet. Abnormal absorption in the gut. Blood loss. What increases the risk? You are more likely to develop this condition if you get menstrual periods (menstruate) or are pregnant. What are the signs or symptoms? Symptoms of this condition may include: Pale skin, lips, and nail beds. Weakness, dizziness, and getting tired easily. Shortness of breath when moving or exercising. Cold hands or feet. Mild anemia may not cause any symptoms. How is this diagnosed? This condition is diagnosed based on: Your medical history. A physical exam. Blood tests. How is this treated? This condition is treated by correcting the cause of your iron deficiency. Treatment may involve: Adding iron-rich foods to your diet. Taking iron supplements. If you are pregnant or breastfeeding, you may need to take extra iron because your normal diet usually does not provide the amount of iron that you need. Increasing vitamin C intake. Vitamin C helps your body absorb iron. Your health care provider may recommend that you take iron supplements along with a glass of orange juice or a vitamin C supplement. Medicines to make heavy menstrual flow lighter. Surgery or additional testing procedures to determine the cause of your anemia. You may need repeat blood tests to determine whether treatment is working. If the treatment does not  seem to be working, you may need more tests. Follow these instructions at home: Medicines Take over-the-counter and prescription medicines only as told by your health care provider. This includes iron supplements and vitamins. This is important because too much iron can be harmful. For the best iron absorption, you should take iron supplements when your stomach is empty. If you cannot tolerate them on an empty stomach, you may need to take them with food. Do not drink milk or take antacids at the same time as your iron supplements. Milk and antacids may interfere with how your body absorbs iron. Iron supplements may turn stool (feces) a darker color and it may appear black. If you cannot tolerate taking iron supplements by mouth, talk with your health care provider about taking them through an IV or through an injection into a muscle. Eating and drinking Talk with your health care provider before changing your diet. Your provider may recommend that you eat foods that contain a lot of iron, such as: Liver. Low-fat (lean) beef. Breads and cereals that have iron added to them (are fortified). Eggs. Dried fruit. Dark green, leafy vegetables. To help your body use the iron from iron-rich foods, eat those foods at the same time as fresh fruits and vegetables that are high in vitamin C. Foods that are high in vitamin C include: Oranges. Peppers. Tomatoes. Mangoes. Managing constipation If you are taking an iron supplement, it may cause constipation. To prevent or treat constipation, you may need to: Drink enough fluid to keep your urine pale yellow. Take over-the-counter or prescription medicines. Eat foods that are high in fiber, such   as beans, whole grains, and fresh fruits and vegetables. Limit foods that are high in fat and processed sugars, such as fried or sweet foods. General instructions Return to your normal activities as told by your health care provider. Ask your health care provider  what activities are safe for you. Keep all follow-up visits. Contact a health care provider if: You feel nauseous or you vomit. You feel weak. You become light-headed when getting up from a sitting or lying down position. You have unexplained sweating. You develop symptoms of constipation. You have a heaviness in your chest. You have trouble breathing with physical activity. Get help right away if: You faint. If this happens, do not drive yourself to the hospital. You have an irregular or rapid heartbeat. Summary Iron deficiency anemia is a condition in which the concentration of red blood cells or hemoglobin in the blood is below normal because of too little iron. This condition is treated by correcting the cause of your iron deficiency. Take over-the-counter and prescription medicines only as told by your health care provider. This includes iron supplements and vitamins. To help your body use the iron from iron-rich foods, eat those foods at the same time as fresh fruits and vegetables that are high in vitamin C. Seek medical help if you have signs or symptoms of worsening anemia. This information is not intended to replace advice given to you by your health care provider. Make sure you discuss any questions you have with your health care provider. Document Revised: 04/09/2021 Document Reviewed: 04/09/2021 Elsevier Patient Education  2023 Elsevier Inc.  

## 2022-04-27 NOTE — Telephone Encounter (Signed)
Opened in error

## 2022-04-27 NOTE — Progress Notes (Signed)
Hospital follow up  Assessment and Plan: Hospital visit follow up for:   Hospital discharge follow-up Reviewed discharge instructions in full including medication changes, diagnostics, labs, and future follow ups appointment. All questions and concerns addressed.    Iron deficiency anemia, unspecified iron deficiency anemia type Continue Ferrous Sulfate Discussed iron rich foods such as lean red meat and green leafy vegetables  - CBC with Differential/Platelet - COMPLETE METABOLIC PANEL WITH GFR  Gastroesophageal reflux disease with esophagitis without hemorrhage Continue PPI. Lifestyle modification:  wt loss, avoid meals 2-3h before bedtime. Consider eliminating food triggers:  chocolate, caffeine, EtOH, acid/spicy food.  - CBC with Differential/Platelet - COMPLETE METABOLIC PANEL WITH GFR  Chronic atrial fibrillation ([name]) Continue Eliquis  Medication management All medications discussed and reviewed in full. All questions and concerns regarding medications addressed.    - CBC with Differential/Platelet - COMPLETE METABOLIC PANEL WITH GFR  Vertigo due to brain injury [facility]) Discussed weaning Diazepam. Decrease to 1/2 tablet (2.5 mg) daily  Start Meclizine 1/2 tab PRN Stay well hydrated.  Orders Placed This Encounter  Procedures   CBC with Differential/Platelet   COMPLETE METABOLIC PANEL WITH GFR   Meds ordered this encounter  Medications   meclizine (ANTIVERT) 25 MG tablet    Sig: 1/2-1 pill up to 3 times daily for motion sickness/dizziness    Dispense:  30 tablet    Refill:  0    Order Specific Question:   Supervising Provider    Answer:   Unk Pinto [phone]    All medications were reviewed with patient and fully reconciled. All questions answered fully, and patient and family members were encouraged to call the office with any further questions or concerns. Discussed goal to avoid readmission related to this diagnosis.   Over 30 minutes of exam,  counseling, chart review, and complex, high/moderate level critical decision making was performed this visit.   Future Appointments  Date Time Provider [place]  04/27/2022  2:00 PM Darrol Jump, NP [facility] None  04/29/2022  1:00 PM [facility] None  04/30/2022 11:45 AM Causey, Charlestine Massed, NP [facility] None  08/12/2022 11:00 AM Unk Pinto, MD [facility] None  04/19/2023 11:00 AM Darrol Jump, NP [facility] None     HPI 82 y.o.female presents for follow up for transition from recent hospitalization or SNIF stay. Admit date to the hospital was 04/17/22, patient was discharged from the hospital on 04/18/22 and our clinical staff contacted the office the day after discharge to set up a follow up appointment. The discharge summary, medications, and diagnostic test results were reviewed before meeting with the patient. The patient was admitted for IDA.  She was seen in office for general wellness visit on 04/16/22.  Routine labs were collected to show a Hbg of 7.2 which was a drop from 11, the 3 months prior.  Ferritin was low at 3, confirming IDA.  She was asked to present to ER.  She had no gross evidence of bleeding.    During her visit to ER she was hemodynamically stable except for HTN.  Stool for occult blood was negative.  She was positive for  malaise, fatigue and weakness.  Found to have acute normochromic normocytic anemia in the setting of Eliquis use.  She was transfused 1 unit of PRBC and started on Pantoprazole 40 mg IV Q12 hr.  GI was consulted and she was set to have a colonoscopy and EGD.  Suspected that her most likely source of IDA was esophagitis or cameron  erosions which were visulaized on her last EGD in 2019.  It is suspected that the most likely source of her IDA is esophagitis and/or [name], which were visualized on her last EGD in 2019. Will plan for EGD and colonoscopy for further evaluation of a source of her IDA and to rule out  malignancy.  She followed up as outpatient with GI, Dr. Lorenso Courier on 04/22/22.   She was noted to have a large hiatal hernia with gastritis. Suspected that anemia was d/t bleeding from esophagitis,  Continue PPI BID and return in two months.  She has restarted her Eliquis.  Awaiting biopsies.    Home health is not involved.   Images while in the hospital: No results found.     Current Outpatient Medications (Cardiovascular):    metoprolol tartrate (LOPRESSOR) 25 MG tablet, Take  1 tablet  Daily  for BP & Heart (Patient taking differently: Take 25 mg by mouth at bedtime.)     Current Outpatient Medications (Analgesics):    acetaminophen (TYLENOL) 500 MG tablet, Take 500-1,000 mg by mouth every 6 (six) hours as needed for mild pain or moderate pain.   Current Outpatient Medications (Hematological):    apixaban (ELIQUIS) 5 MG TABS tablet, Take  1 tablet  2 x /day  to Prevent Blood Clots / Patient knows to take by mouth (Patient taking differently: Take 5 mg by mouth in the morning and at bedtime.)   ferrous sulfate 325 (65 FE) MG tablet, Take 1 tablet (325 mg total) by mouth See admin instructions. Take 325 mg by mouth three times a week with food   Current Outpatient Medications (Other):    Cholecalciferol (VITAMIN D3) 1000 units CAPS, Take 1,000-4,000 Units by mouth daily.   diazepam (VALIUM) 5 MG tablet, TAKE 1/2-1 TABLET BY MOUTH 2 TO 3 TIMES DAILY FOR DIZZINESS/VERTIGO   gabapentin (NEURONTIN) 300 MG capsule, Take  1 capsule  3 x /day for Nerve Pain                                                  /                    TAKE                           BY                     MOUTH (Patient taking differently: Take 600 mg by mouth at bedtime.)   Multiple Vitamins-Minerals (MULTIVITAL PO), Take 1 tablet by mouth daily.   pantoprazole (PROTONIX) 40 MG tablet, Take 1 tablet (40 mg total) by mouth 2 (two) times daily. Take  1 tablet  Daily  to Prevent Heartburn & Indigestion   TURMERIC PO,  Take 1 capsule by mouth daily.  Current Facility-Administered Medications (Other):    0.9 %  sodium chloride infusion  Past Medical History:  Diagnosis Date   Anemia    Arthritis    Benign neoplasm of sigmoid colon    Deafness    left ear per pt    Dyspnea    pt reports with exertion due to hip and knees    Dysrhythmia    afib    History of bleeding ulcers    2019 received  3-4 units of blood    Hyperlipidemia    Hypertension    Pericardial effusion 02/04/2018   Pre-diabetes    per Dr Einar Gip note of 09/28/19 pt denies at preop visit on 10/06/19    Prediabetes    Primary localized osteoarthritis of right hip 09/26/2019   Pulmonary embolism ([place])    hx of 2019    Vertigo    Vitamin D deficiency      No Known Allergies  ROS: all negative except above.   Physical Exam: There were no vitals filed for this visit. There were no vitals taken for this visit. General Appearance: Well nourished, in no apparent distress. Eyes: PERRLA, EOMs, conjunctiva no swelling or erythema Sinuses: No Frontal/maxillary tenderness ENT/Mouth: Ext aud canals clear, TMs without erythema, bulging. No erythema, swelling, or exudate on post pharynx.  Tonsils not swollen or erythematous. Hearing normal.  Neck: Supple, thyroid normal.  Respiratory: Respiratory effort normal, BS equal bilaterally without rales, rhonchi, wheezing or stridor.  Cardio: RRR with no MRGs. Brisk peripheral pulses without edema.  Abdomen: Soft, + BS.  Non tender, no guarding, rebound, hernias, masses. Lymphatics: Non tender without lymphadenopathy.  Musculoskeletal: Full ROM, 5/5 strength, normal gait.  Skin: Warm, dry without rashes, lesions, ecchymosis.  Neuro: Cranial nerves intact. Normal muscle tone, no cerebellar symptoms. Sensation intact.  Psych: Awake and oriented X 3, normal affect, Insight and Judgment appropriate.     Darrol Jump, NP 1:37 PM [facility]

## 2022-04-28 ENCOUNTER — Telehealth: Payer: Self-pay | Admitting: Nurse Practitioner

## 2022-04-28 LAB — CBC WITH DIFFERENTIAL/PLATELET
Absolute Monocytes: 505 cells/uL (ref 200–950)
Basophils Absolute: 41 cells/uL (ref 0–200)
Basophils Relative: 0.7 %
Eosinophils Absolute: 70 cells/uL (ref 15–500)
Eosinophils Relative: 1.2 %
HCT: 28.3 % — ABNORMAL LOW (ref 35.0–45.0)
Hemoglobin: 8.5 g/dL — ABNORMAL LOW (ref 11.7–15.5)
Lymphs Abs: 928 cells/uL (ref 850–3900)
MCH: 25.3 pg — ABNORMAL LOW (ref 27.0–33.0)
MCHC: 30 g/dL — ABNORMAL LOW (ref 32.0–36.0)
MCV: 84.2 fL (ref 80.0–100.0)
MPV: 13.1 fL — ABNORMAL HIGH (ref 7.5–12.5)
Monocytes Relative: 8.7 %
Neutro Abs: 4257 cells/uL (ref 1500–7800)
Neutrophils Relative %: 73.4 %
Platelets: 182 10*3/uL (ref 140–400)
RBC: 3.36 10*6/uL — ABNORMAL LOW (ref 3.80–5.10)
RDW: 16.7 % — ABNORMAL HIGH (ref 11.0–15.0)
Total Lymphocyte: 16 %
WBC: 5.8 10*3/uL (ref 3.8–10.8)

## 2022-04-28 LAB — COMPLETE METABOLIC PANEL WITH GFR
AG Ratio: 2.1 (calc) (ref 1.0–2.5)
ALT: 12 U/L (ref 6–29)
AST: 18 U/L (ref 10–35)
Albumin: 4 g/dL (ref 3.6–5.1)
Alkaline phosphatase (APISO): 79 U/L (ref 37–153)
BUN: 24 mg/dL (ref 7–25)
CO2: 28 mmol/L (ref 20–32)
Calcium: 9 mg/dL (ref 8.6–10.4)
Chloride: 110 mmol/L (ref 98–110)
Creat: 1 mg/dL (ref 0.60–1.00)
Globulin: 1.9 g/dL (calc) (ref 1.9–3.7)
Glucose, Bld: 101 mg/dL — ABNORMAL HIGH (ref 65–99)
Potassium: 4.2 mmol/L (ref 3.5–5.3)
Sodium: 145 mmol/L (ref 135–146)
Total Bilirubin: 0.2 mg/dL (ref 0.2–1.2)
Total Protein: 5.9 g/dL — ABNORMAL LOW (ref 6.1–8.1)
eGFR: 57 mL/min/{1.73_m2} — ABNORMAL LOW (ref >=60)

## 2022-04-28 NOTE — Telephone Encounter (Signed)
Pt called to deactivate mychart bc she wants to be called about labs in the future, she frequently has questions about them and struggles navigating mychart. She is wanting a call back about her labs from yesterday, she doesn't understand tonyas notes.

## 2022-04-29 ENCOUNTER — Inpatient Hospital Stay: Payer: PPO | Attending: Hematology and Oncology

## 2022-04-29 DIAGNOSIS — Z86711 Personal history of pulmonary embolism: Secondary | ICD-10-CM | POA: Insufficient documentation

## 2022-04-29 DIAGNOSIS — E559 Vitamin D deficiency, unspecified: Secondary | ICD-10-CM | POA: Insufficient documentation

## 2022-04-29 DIAGNOSIS — I10 Essential (primary) hypertension: Secondary | ICD-10-CM | POA: Insufficient documentation

## 2022-04-29 DIAGNOSIS — K922 Gastrointestinal hemorrhage, unspecified: Secondary | ICD-10-CM | POA: Insufficient documentation

## 2022-04-29 DIAGNOSIS — Z87891 Personal history of nicotine dependence: Secondary | ICD-10-CM | POA: Insufficient documentation

## 2022-04-29 DIAGNOSIS — D500 Iron deficiency anemia secondary to blood loss (chronic): Secondary | ICD-10-CM | POA: Insufficient documentation

## 2022-04-29 DIAGNOSIS — I4891 Unspecified atrial fibrillation: Secondary | ICD-10-CM | POA: Insufficient documentation

## 2022-04-29 DIAGNOSIS — Z79899 Other long term (current) drug therapy: Secondary | ICD-10-CM | POA: Insufficient documentation

## 2022-04-30 ENCOUNTER — Encounter: Payer: Self-pay | Admitting: Internal Medicine

## 2022-04-30 ENCOUNTER — Encounter: Payer: Self-pay | Admitting: Adult Health

## 2022-04-30 ENCOUNTER — Inpatient Hospital Stay: Payer: PPO

## 2022-04-30 ENCOUNTER — Inpatient Hospital Stay (HOSPITAL_BASED_OUTPATIENT_CLINIC_OR_DEPARTMENT_OTHER): Payer: PPO | Admitting: Adult Health

## 2022-04-30 ENCOUNTER — Other Ambulatory Visit: Payer: Self-pay

## 2022-04-30 VITALS — BP 169/71 | HR 68 | Temp 97.8°F | Resp 18

## 2022-04-30 VITALS — BP 158/99 | HR 65 | Temp 98.9°F | Resp 18 | Ht 65.0 in | Wt 182.4 lb

## 2022-04-30 DIAGNOSIS — Z87891 Personal history of nicotine dependence: Secondary | ICD-10-CM | POA: Diagnosis not present

## 2022-04-30 DIAGNOSIS — Z79899 Other long term (current) drug therapy: Secondary | ICD-10-CM | POA: Diagnosis not present

## 2022-04-30 DIAGNOSIS — I4891 Unspecified atrial fibrillation: Secondary | ICD-10-CM | POA: Diagnosis not present

## 2022-04-30 DIAGNOSIS — E559 Vitamin D deficiency, unspecified: Secondary | ICD-10-CM | POA: Diagnosis not present

## 2022-04-30 DIAGNOSIS — Z86711 Personal history of pulmonary embolism: Secondary | ICD-10-CM | POA: Diagnosis not present

## 2022-04-30 DIAGNOSIS — D500 Iron deficiency anemia secondary to blood loss (chronic): Secondary | ICD-10-CM

## 2022-04-30 DIAGNOSIS — K922 Gastrointestinal hemorrhage, unspecified: Secondary | ICD-10-CM | POA: Diagnosis not present

## 2022-04-30 DIAGNOSIS — I10 Essential (primary) hypertension: Secondary | ICD-10-CM | POA: Diagnosis not present

## 2022-04-30 MED ORDER — SODIUM CHLORIDE 0.9 % IV SOLN: Freq: Once | INTRAVENOUS | Status: AC

## 2022-04-30 MED ORDER — SODIUM CHLORIDE 0.9 % IV SOLN
400.0000 mg | Freq: Once | INTRAVENOUS | Status: AC
  Administered 2022-04-30: 400 mg via INTRAVENOUS
  Filled 2022-04-30: qty 20

## 2022-04-30 NOTE — Patient Instructions (Signed)

## 2022-04-30 NOTE — Patient Instructions (Signed)

## 2022-04-30 NOTE — Progress Notes (Signed)
[facility] Follow up:    Unk Pinto, MD [address] [place] [address]   DIAGNOSIS: Iron deficiency anemia  SUMMARY OF ONCOLOGIC HISTORY: Received iron in February 05, 2018-Feraheme 554m Referred to Dr. GLindi Adiefor evaluation on October 30, 2021--at that time her hemoglobin was 8.2, MCV 73.5, RDW 16.5, CMP normal, TIBC 457, iron saturation 4%, ferritin is 3 Venofer administered at 402mon 11/07/2021 and 11/14/2021 Follow-up hemoglobin on January 14, 2022 demonstrated a hemoglobin of 11.8, MCV of 90, ferritin of 31, TIBC of 357, and saturation of 13%. Referral to GI placed by Dr. GuLindi Adien August of 2023. Hospital admission from April 17, 2022 through April 18, 2022 for symptomatic anemia requiring Protonix drip, blood transfusion. Colonoscopy and upper endoscopy completed on April 23, 2022.  There were polyps removed, diverticulosis noted, LA grade a reflux esophagitis with bleeding, large hiatal hernia, gastritis, and a normal examined duodenum.  She was recommended Protonix 40 mg twice daily.  CURRENT THERAPY: Oral iron 3 times a week  INTERVAL HISTORY: Lori [id number].o. female returns for f/u after her recent hospitalization. She is fatigued and lives alone.  She notes that her hemoglobin is only slightly improved and since her discharge, she gets slightly lightheaded with position changes. She is taking the protonix as prescribed. Her Ferritin on 2/2 was 3.     Patient Active Problem List   Diagnosis Date Noted   Iron deficiency anemia 04/17/2022   Thrombophilia ([facility]02/06/2019   Spinal stenosis of lumbar region 04/20/2019   Iron deficiency anemia due to chronic blood loss 03/08/2019   Varicose vein of leg 09/01/2018   Gastroesophageal reflux disease with esophagitis    Symptomatic anemia 02/04/2018   History of pulmonary embolism (01/2018) 02/04/2018   Senile purpura (HCL[facility]05/09/2017   Atrial fibrillation ([facility] [place] 06/16/2017   Vertigo due to brain injury (HCEl[facility]11/23/2016   Obesity (BMI 30.0-34.9) 02/06/2015   Medication management 11/18/2013   Hyperlipidemia, mixed    Vitamin D deficiency    Essential hypertension     has No Known Allergies.  MEDICAL HISTORY: Past Medical History:  Diagnosis Date   Anemia    Arthritis    Benign neoplasm of sigmoid colon    Deafness    left ear per pt    Dyspnea    pt reports with exertion due to hip and knees    Dysrhythmia    afib    History of bleeding ulcers    2019 received 3-4 units of blood    Hyperlipidemia    Hypertension    Pericardial effusion 02/04/2018   Pre-diabetes    per Dr GaEinar Gipote of 09/28/19 pt denies at preop visit on 10/06/19    Prediabetes    Primary localized osteoarthritis of right hip 09/26/2019   Pulmonary embolism (HCYankton   hx of 2019    Vertigo    Vitamin D deficiency     SURGICAL HISTORY: Past Surgical History:  Procedure Laterality Date   BIOPSY  02/05/2018   Procedure: BIOPSY;  Surgeon: StLadene ArtistMD;  [facility];  Service: Endoscopy;;   CATARACT EXTRACTION W/ INTRAOCULAR LENS  IMPLANT, BILATERAL  2009   CEREBRAL ANEURYSM REPAIR     COLONOSCOPY N/A 02/05/2018   Procedure: COLONOSCOPY;  Surgeon: StLadene ArtistMD;  [facility];  Service: Endoscopy;  Laterality: N/A;   ESOPHAGOGASTRODUODENOSCOPY N/A 02/05/2018   Procedure: ESOPHAGOGASTRODUODENOSCOPY (EGD);  Surgeon: StLadene Artist  MD;  [facility];  Service: Endoscopy;  Laterality: N/A;   FOOT SURGERY  1993   POLYPECTOMY  02/05/2018   Procedure: POLYPECTOMY;  Surgeon: Ladene Artist, MD;  [facility];  Service: Endoscopy;;   TOTAL HIP ARTHROPLASTY Right 10/17/2019   Procedure: TOTAL HIP ARTHROPLASTY ANTERIOR APPROACH;  Surgeon: Renette Butters, MD;  Location: [facility];  Service: Orthopedics;  Laterality: Right;   TUBAL LIGATION      SOCIAL HISTORY: Social History   Socioeconomic History   Marital  status: Divorced    Spouse name: Not on file   Number of children: 1   Years of education: 12   Highest education level: Not on file  Occupational History   Occupation: retired  Tobacco Use   Smoking status: Former    Types: Cigarettes    Quit date: 08/07/1990    Years since quitting: 31.7   Smokeless tobacco: Never  Vaping Use   Vaping Use: Never used  Substance and Sexual Activity   Alcohol use: No    Alcohol/week: 0.0 standard drinks of alcohol   Drug use: No   Sexual activity: Not on file  Other Topics Concern   Not on file  Social History Narrative   Not on file   Social Determinants of Health   Financial Resource Strain: Not on file  Food Insecurity: No Food Insecurity (04/17/2022)   Hunger Vital Sign    Worried About [place] in the Last Year: Never true    [place] in the Last Year: Never true  Transportation Needs: No Transportation Needs (04/17/2022)   PRAPARE - Hydrologist (Medical): No    Lack of Transportation (Non-Medical): No  Physical Activity: Not on file  Stress: Not on file  Social Connections: Not on file  Intimate Partner Violence: Not At Risk (04/17/2022)   Humiliation, Afraid, Rape, and Kick questionnaire    Fear of Current or Ex-Partner: No    Emotionally Abused: No    Physically Abused: No    Sexually Abused: No    FAMILY HISTORY: Family History  Problem Relation Age of Onset   Hypertension Mother    Diabetes Mother    Cancer Mother        Pancreatic   Cancer Father        Skin   Colon cancer Neg Hx    Stomach cancer Neg Hx    Esophageal cancer Neg Hx     Review of Systems  Constitutional:  Positive for fatigue. Negative for appetite change, chills, fever and unexpected weight change.  HENT:   Negative for hearing loss, lump/mass and trouble swallowing.   Eyes:  Negative for eye problems and icterus.  Respiratory:  Negative for chest tightness, cough and shortness of breath.    Cardiovascular:  Negative for chest pain, leg swelling and palpitations.  Gastrointestinal:  Positive for blood in stool. Negative for abdominal distention, abdominal pain, constipation, diarrhea, nausea and vomiting.  Endocrine: Negative for hot flashes.  Genitourinary:  Negative for difficulty urinating.   Musculoskeletal:  Negative for arthralgias.  Skin:  Negative for itching and rash.  Neurological:  Negative for dizziness, extremity weakness, headaches and numbness.  Hematological:  Negative for adenopathy. Does not bruise/bleed easily.  Psychiatric/Behavioral:  Negative for depression. The patient is not nervous/anxious.      PHYSICAL EXAMINATION  ECOG PERFORMANCE STATUS: 1 - Symptomatic but completely ambulatory  Vitals:   04/30/22 1129  BP: Marland Kitchen)  158/99  Pulse: 65  Resp: 18  Temp: 98.9 F (37.2 C)  SpO2: 100%    Physical Exam Constitutional:      General: She is not in acute distress.    Appearance: Normal appearance. She is not toxic-appearing.  HENT:     Head: Normocephalic and atraumatic.  Eyes:     General: No scleral icterus. Cardiovascular:     Rate and Rhythm: Normal rate and regular rhythm.     Pulses: Normal pulses.     Heart sounds: Normal heart sounds.  Pulmonary:     Effort: Pulmonary effort is normal.     Breath sounds: Normal breath sounds.  Abdominal:     General: Abdomen is flat. Bowel sounds are normal. There is no distension.     Palpations: Abdomen is soft.     Tenderness: There is no abdominal tenderness.  Musculoskeletal:        General: No swelling.     Cervical back: Neck supple.  Lymphadenopathy:     Cervical: No cervical adenopathy.  Skin:    General: Skin is warm and dry.     Findings: No rash.  Neurological:     General: No focal deficit present.     Mental Status: She is alert.  Psychiatric:        Mood and Affect: Mood normal.        Behavior: Behavior normal.     LABORATORY DATA:  CBC    Component Value Date/Time    WBC 5.8 04/27/2022 1446   RBC 3.36 (L) 04/27/2022 1446   HGB 8.5 (L) 04/27/2022 1446   HCT 28.3 (L) 04/27/2022 1446   PLT 182 04/27/2022 1446   MCV 84.2 04/27/2022 1446   MCH 25.3 (L) 04/27/2022 1446   MCHC 30.0 (L) 04/27/2022 1446   RDW 16.7 (H) 04/27/2022 1446   LYMPHSABS 928 04/27/2022 1446   MONOABS 0.5 04/17/2022 1508   EOSABS 70 04/27/2022 1446   BASOSABS 41 04/27/2022 1446    CMP     Component Value Date/Time   NA 145 04/27/2022 1446   K 4.2 04/27/2022 1446   CL 110 04/27/2022 1446   CO2 28 04/27/2022 1446   GLUCOSE 101 (H) 04/27/2022 1446   BUN 24 04/27/2022 1446   CREATININE 1.00 04/27/2022 1446   CALCIUM 9.0 04/27/2022 1446   PROT 5.9 (L) 04/27/2022 1446   ALBUMIN 3.4 (L) 04/18/2022 0124   AST 18 04/27/2022 1446   ALT 12 04/27/2022 1446   ALKPHOS 65 04/18/2022 0124   BILITOT 0.2 04/27/2022 1446   GFRNONAA >60 04/18/2022 0124   GFRNONAA 61 07/02/2020 1519   GFRAA 71 07/02/2020 1519     ASSESSMENT and THERAPY PLAN:   Iron deficiency anemia due to chronic blood loss Lori [name] is a 82 year old woman with iron deficiency anemia here today for f/u and evaluation following hospitalization from upper GI bleed.    Johanna and I reviewed that she is anemic due to her iron deficiency from the blood loss from her stomach. I reviewed with her that the only way to lose iron is by bleeding. With her hemoglobin just above 8 and her ferritin level at 3, she will benefit from IV iron.  She will receive Venofer 455m today and again in 1 week. We will see her back in 12 weeks to recheck her iron studies.   I reviewed the above with Dr. GLindi Adiewho is in agreement with the assessment and plan.    All  questions were answered. The patient knows to call the clinic with any problems, questions or concerns. We can certainly see the patient much sooner if necessary.  Total encounter time:30 minutes*in face-to-face visit time, chart review, lab review, care coordination, order entry,  and documentation of the encounter time.    Wilber Bihari, NP 04/30/22 9:58 PM Medical Oncology and Hematology [facility] [place], [place] [address] Tel. [phone]    Fax. [phone]  *Total Encounter Time as defined by the Centers for Medicare and Medicaid Services includes, in addition to the face-to-face time of a patient visit (documented in the note above) non-face-to-face time: obtaining and reviewing outside history, ordering and reviewing medications, tests or procedures, care coordination (communications with other health care professionals or caregivers) and documentation in the medical record.

## 2022-04-30 NOTE — Assessment & Plan Note (Signed)
Lori [name] is a 82 year old woman with iron deficiency anemia here today for f/u and evaluation following hospitalization from upper GI bleed.    Shamira and I reviewed that she is anemic due to her iron deficiency from the blood loss from her stomach. I reviewed with her that the only way to lose iron is by bleeding. With her hemoglobin just above 8 and her ferritin level at 3, she will benefit from IV iron.  She will receive Venofer 457m today and again in 1 week. We will see her back in 12 weeks to recheck her iron studies.   I reviewed the above with Dr. GLindi Adiewho is in agreement with the assessment and plan.

## 2022-05-06 ENCOUNTER — Telehealth: Payer: Self-pay | Admitting: Adult Health

## 2022-05-06 NOTE — Telephone Encounter (Signed)
Rescheduled appointment per secure chat. Patient is aware of the changes made to her upcoming appointment.

## 2022-05-11 ENCOUNTER — Inpatient Hospital Stay: Payer: PPO

## 2022-05-11 VITALS — BP 164/65 | HR 68 | Temp 98.0°F | Resp 20 | Ht 65.0 in | Wt 178.8 lb

## 2022-05-11 DIAGNOSIS — D500 Iron deficiency anemia secondary to blood loss (chronic): Secondary | ICD-10-CM

## 2022-05-11 MED ORDER — SODIUM CHLORIDE 0.9 % IV SOLN
400.0000 mg | Freq: Once | INTRAVENOUS | Status: AC
  Administered 2022-05-11: 400 mg via INTRAVENOUS
  Filled 2022-05-11: qty 20

## 2022-05-11 MED ORDER — SODIUM CHLORIDE 0.9 % IV SOLN: Freq: Once | INTRAVENOUS | Status: AC

## 2022-05-11 NOTE — Patient Instructions (Signed)

## 2022-05-11 NOTE — Progress Notes (Signed)
Pt. declines to stay for 30 minute post observation. Vital signs stable, left via ambulation, no respiratory distress noted. 

## 2022-05-12 ENCOUNTER — Encounter: Payer: Self-pay | Admitting: Internal Medicine

## 2022-05-19 ENCOUNTER — Other Ambulatory Visit: Payer: Self-pay | Admitting: Adult Health

## 2022-05-19 ENCOUNTER — Inpatient Hospital Stay: Payer: PPO | Attending: Hematology and Oncology

## 2022-05-19 ENCOUNTER — Other Ambulatory Visit: Payer: Self-pay

## 2022-05-19 VITALS — BP 121/70 | HR 61 | Temp 98.0°F | Resp 18

## 2022-05-19 DIAGNOSIS — K922 Gastrointestinal hemorrhage, unspecified: Secondary | ICD-10-CM | POA: Insufficient documentation

## 2022-05-19 DIAGNOSIS — D500 Iron deficiency anemia secondary to blood loss (chronic): Secondary | ICD-10-CM | POA: Insufficient documentation

## 2022-05-19 MED ORDER — SODIUM CHLORIDE 0.9 % IV SOLN
400.0000 mg | Freq: Once | INTRAVENOUS | Status: AC
  Administered 2022-05-19: 400 mg via INTRAVENOUS
  Filled 2022-05-19: qty 20

## 2022-05-19 MED ORDER — SODIUM CHLORIDE 0.9 % IV SOLN: INTRAVENOUS | Status: DC

## 2022-05-19 NOTE — Patient Instructions (Signed)

## 2022-07-02 ENCOUNTER — Other Ambulatory Visit: Payer: Self-pay | Admitting: Internal Medicine

## 2022-07-02 DIAGNOSIS — I10 Essential (primary) hypertension: Secondary | ICD-10-CM

## 2022-07-09 ENCOUNTER — Encounter: Payer: PPO | Admitting: Internal Medicine

## 2022-07-09 ENCOUNTER — Other Ambulatory Visit: Payer: Self-pay | Admitting: Internal Medicine

## 2022-07-09 DIAGNOSIS — I480 Paroxysmal atrial fibrillation: Secondary | ICD-10-CM

## 2022-08-11 ENCOUNTER — Inpatient Hospital Stay: Payer: PPO | Attending: Hematology and Oncology

## 2022-08-11 ENCOUNTER — Encounter: Payer: Self-pay | Admitting: Adult Health

## 2022-08-11 ENCOUNTER — Encounter: Payer: Self-pay | Admitting: Internal Medicine

## 2022-08-11 ENCOUNTER — Inpatient Hospital Stay (HOSPITAL_BASED_OUTPATIENT_CLINIC_OR_DEPARTMENT_OTHER): Payer: PPO | Admitting: Adult Health

## 2022-08-11 VITALS — BP 179/71 | HR 65 | Temp 98.1°F | Resp 18 | Ht 65.0 in | Wt 178.2 lb

## 2022-08-11 DIAGNOSIS — K922 Gastrointestinal hemorrhage, unspecified: Secondary | ICD-10-CM | POA: Insufficient documentation

## 2022-08-11 DIAGNOSIS — D500 Iron deficiency anemia secondary to blood loss (chronic): Secondary | ICD-10-CM

## 2022-08-11 DIAGNOSIS — Z87891 Personal history of nicotine dependence: Secondary | ICD-10-CM | POA: Insufficient documentation

## 2022-08-11 DIAGNOSIS — Z7901 Long term (current) use of anticoagulants: Secondary | ICD-10-CM | POA: Diagnosis not present

## 2022-08-11 DIAGNOSIS — Z79899 Other long term (current) drug therapy: Secondary | ICD-10-CM | POA: Diagnosis not present

## 2022-08-11 DIAGNOSIS — Z86711 Personal history of pulmonary embolism: Secondary | ICD-10-CM | POA: Diagnosis not present

## 2022-08-11 DIAGNOSIS — E559 Vitamin D deficiency, unspecified: Secondary | ICD-10-CM | POA: Insufficient documentation

## 2022-08-11 LAB — CMP (CANCER CENTER ONLY)
ALT: 9 U/L (ref 0–44)
AST: 17 U/L (ref 15–41)
Albumin: 4.4 g/dL (ref 3.5–5.0)
Alkaline Phosphatase: 89 U/L (ref 38–126)
Anion gap: 6 (ref 5–15)
BUN: 13 mg/dL (ref 8–23)
CO2: 28 mmol/L (ref 22–32)
Calcium: 9.1 mg/dL (ref 8.9–10.3)
Chloride: 108 mmol/L (ref 98–111)
Creatinine: 0.85 mg/dL (ref 0.44–1.00)
GFR, Estimated: >60 mL/min (ref >60)
Glucose, Bld: 82 mg/dL (ref 70–99)
Potassium: 3.8 mmol/L (ref 3.5–5.1)
Sodium: 142 mmol/L (ref 135–145)
Total Bilirubin: 0.4 mg/dL (ref 0.3–1.2)
Total Protein: 6.3 g/dL — ABNORMAL LOW (ref 6.5–8.1)

## 2022-08-11 LAB — CBC WITH DIFFERENTIAL (CANCER CENTER ONLY)
Abs Immature Granulocytes: 0.01 10*3/uL (ref 0.00–0.07)
Basophils Absolute: 0.1 10*3/uL (ref 0.0–0.1)
Basophils Relative: 1 %
Eosinophils Absolute: 0.1 10*3/uL (ref 0.0–0.5)
Eosinophils Relative: 1 %
HCT: 38.9 % (ref 36.0–46.0)
Hemoglobin: 12.8 g/dL (ref 12.0–15.0)
Immature Granulocytes: 0 %
Lymphocytes Relative: 21 %
Lymphs Abs: 1.2 10*3/uL (ref 0.7–4.0)
MCH: 30.6 pg (ref 26.0–34.0)
MCHC: 32.9 g/dL (ref 30.0–36.0)
MCV: 93.1 fL (ref 80.0–100.0)
Monocytes Absolute: 0.5 10*3/uL (ref 0.1–1.0)
Monocytes Relative: 9 %
Neutro Abs: 3.9 10*3/uL (ref 1.7–7.7)
Neutrophils Relative %: 68 %
Platelet Count: 166 10*3/uL (ref 150–400)
RBC: 4.18 MIL/uL (ref 3.87–5.11)
RDW: 16.8 % — ABNORMAL HIGH (ref 11.5–15.5)
WBC Count: 5.7 10*3/uL (ref 4.0–10.5)
nRBC: 0 % (ref 0.0–0.2)

## 2022-08-11 LAB — IRON AND IRON BINDING CAPACITY (CC-WL,HP ONLY)
Iron: 54 ug/dL (ref 28–170)
Saturation Ratios: 16 % (ref 10.4–31.8)
TIBC: 349 ug/dL (ref 250–450)
UIBC: 295 ug/dL (ref 148–442)

## 2022-08-11 LAB — FERRITIN: Ferritin: 47 ng/mL (ref 11–307)

## 2022-08-11 NOTE — Patient Instructions (Signed)

## 2022-08-11 NOTE — Progress Notes (Signed)
[facility] Follow up:    Lori Cowboy, MD [address] [place] [address]   DIAGNOSIS: Iron deficiency anemia  SUMMARY OF HEMATOLOGIC HISTORY: Received iron in February 05, 2018-Feraheme 510mg  Referred to Dr. Pamelia Hoit for evaluation on October 30, 2021--at that time her hemoglobin was 8.2, MCV 73.5, RDW 16.5, CMP normal, TIBC 457, iron saturation 4%, ferritin is 3 Venofer administered at 400mg  on 11/07/2021 and 11/14/2021 Follow-up hemoglobin on January 14, 2022 demonstrated a hemoglobin of 11.8, MCV of 90, ferritin of 31, TIBC of 357, and saturation of 13%. Referral to GI placed by Dr. Pamelia Hoit in August of 2023. Hospital admission from April 17, 2022 through April 18, 2022 for symptomatic anemia requiring Protonix drip, blood transfusion. Ferritin in f/u was 3 Venofer 400mg  given weekly x 3 beginning 04/30/2022 Colonoscopy and upper endoscopy completed on April 23, 2022.  There were polyps removed, diverticulosis noted, LA grade a reflux esophagitis with bleeding, large hiatal hernia, gastritis, and a normal examined duodenum.  She was recommended Protonix 40 mg twice daily.  CURRENT THERAPY: Oral iron three times a week; intermittent IV iron  INTERVAL HISTORY: Lori [name] 82 y.o. female returns for follow-up after receiving IV iron in February.  Tells me she tolerated it quite well.  At that time her hemoglobin was 8.5.  Today her hemoglobin is 12.8.  She continues on Protonix daily, however she also takes Eliquis twice daily.  She denies any blood in her stool or black tarry stool.  She tells me that she has no difficulty taking oral iron a few times a week.  Her blood pressure is slightly elevated today.  She has no headache vision changes or other symptoms.  She tells me that she is very mad at [facility] power because her power went out yesterday and instead of it coming back on when her neighbors came on, it took an additional 16 hours to get  her power back.   Patient Active Problem List   Diagnosis Date Noted   Iron deficiency anemia 04/17/2022   Thrombophilia ([name]) 04/20/2019   Spinal stenosis of lumbar region 04/20/2019   Iron deficiency anemia due to chronic blood loss 03/08/2019   Varicose vein of leg 09/01/2018   Gastroesophageal reflux disease with esophagitis    Symptomatic anemia 02/04/2018   History of pulmonary embolism (01/2018) 02/04/2018   Senile purpura ([name]) 07/20/2017   Atrial fibrillation ([name]) 06/16/2017   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) 02/06/2015   Obesity (BMI 30.0-34.9) 02/06/2015   Medication management 11/18/2013   Hyperlipidemia, mixed    Vitamin D deficiency    Essential hypertension     has No Known Allergies.  MEDICAL HISTORY: Past Medical History:  Diagnosis Date   Anemia    Arthritis    Benign neoplasm of sigmoid colon    Deafness    left ear per pt    Dyspnea    pt reports with exertion due to hip and knees    Dysrhythmia    afib    History of bleeding ulcers    2019 received 3-4 units of blood    Hyperlipidemia    Hypertension    Pericardial effusion 02/04/2018   Pre-diabetes    per Dr Jacinto Halim note of 09/28/19 pt denies at preop visit on 10/06/19    Prediabetes    Primary localized osteoarthritis of right hip 09/26/2019   Pulmonary embolism ([name])    hx of 2019    Vertigo  Vitamin D deficiency     SURGICAL HISTORY: Past Surgical History:  Procedure Laterality Date   BIOPSY  02/05/2018   Procedure: BIOPSY;  Surgeon: Meryl Dare, MD;  [facility];  Service: Endoscopy;;   CATARACT EXTRACTION W/ INTRAOCULAR LENS  IMPLANT, BILATERAL  2009   CEREBRAL ANEURYSM REPAIR     COLONOSCOPY N/A 02/05/2018   Procedure: COLONOSCOPY;  Surgeon: Meryl Dare, MD;  [facility];  Service: Endoscopy;  Laterality: N/A;   ESOPHAGOGASTRODUODENOSCOPY N/A 02/05/2018   Procedure: ESOPHAGOGASTRODUODENOSCOPY (EGD);  Surgeon: Meryl Dare, MD;  [facility];  Service: Endoscopy;  Laterality: N/A;   FOOT SURGERY  1993   POLYPECTOMY  02/05/2018   Procedure: POLYPECTOMY;  Surgeon: Meryl Dare, MD;  [facility];  Service: Endoscopy;;   TOTAL HIP ARTHROPLASTY Right 10/17/2019   Procedure: TOTAL HIP ARTHROPLASTY ANTERIOR APPROACH;  Surgeon: Sheral Apley, MD;  [facility];  Service: Orthopedics;  Laterality: Right;   TUBAL LIGATION      SOCIAL HISTORY: Social History   Socioeconomic History   Marital status: Divorced    Spouse name: Not on file   Number of children: 1   Years of education: 12   Highest education level: Not on file  Occupational History   Occupation: retired  Tobacco Use   Smoking status: Former    Types: Cigarettes    Quit date: 08/07/1990    Years since quitting: 32.0   Smokeless tobacco: Never  Vaping Use   Vaping Use: Never used  Substance and Sexual Activity   Alcohol use: No    Alcohol/week: 0.0 standard drinks of alcohol   Drug use: No   Sexual activity: Not on file  Other Topics Concern   Not on file  Social History Narrative   Not on file   Social Determinants of Health   Financial Resource Strain: Not on file  Food Insecurity: No Food Insecurity (04/17/2022)   Hunger Vital Sign    Worried About Running Out of Food in the Last Year: Never true    Ran Out of Food in the Last Year: Never true  Transportation Needs: No Transportation Needs (04/17/2022)   PRAPARE - Administrator, Civil Service (Medical): No    Lack of Transportation (Non-Medical): No  Physical Activity: Not on file  Stress: Not on file  Social Connections: Not on file  Intimate Partner Violence: Not At Risk (04/17/2022)   Humiliation, Afraid, Rape, and Kick questionnaire    Fear of Current or Ex-Partner: No    Emotionally Abused: No    Physically Abused: No    Sexually Abused: No    FAMILY HISTORY: Family History  Problem Relation Age of Onset   Hypertension Mother    Diabetes Mother     Cancer Mother        Pancreatic   Cancer Father        Skin   Colon cancer Neg Hx    Stomach cancer Neg Hx    Esophageal cancer Neg Hx     Review of Systems  Constitutional:  Negative for appetite change, chills, fatigue, fever and unexpected weight change.  HENT:   Negative for hearing loss, lump/mass and trouble swallowing.   Eyes:  Negative for eye problems and icterus.  Respiratory:  Negative for chest tightness, cough and shortness of breath.   Cardiovascular:  Negative for chest pain, leg swelling and palpitations.  Gastrointestinal:  Negative for abdominal distention, abdominal pain,  constipation, diarrhea, nausea and vomiting.  Endocrine: Negative for hot flashes.  Genitourinary:  Negative for difficulty urinating.   Musculoskeletal:  Negative for arthralgias.  Skin:  Negative for itching and rash.  Neurological:  Negative for dizziness, extremity weakness, headaches and numbness.  Hematological:  Negative for adenopathy. Does not bruise/bleed easily.  Psychiatric/Behavioral:  Negative for depression. The patient is not nervous/anxious.       PHYSICAL EXAMINATION    Vitals:   08/11/22 1115  BP: (!) 179/71  Pulse: 65  Resp: 18  Temp: 98.1 F (36.7 C)  SpO2: 99%    Physical Exam Constitutional:      General: She is not in acute distress.    Appearance: Normal appearance. She is not toxic-appearing.  HENT:     Head: Normocephalic and atraumatic.     Mouth/Throat:     Mouth: Mucous membranes are moist.     Pharynx: Oropharynx is clear. No oropharyngeal exudate or posterior oropharyngeal erythema.  Eyes:     General: No scleral icterus. Cardiovascular:     Rate and Rhythm: Normal rate and regular rhythm.     Pulses: Normal pulses.     Heart sounds: Normal heart sounds.  Pulmonary:     Effort: Pulmonary effort is normal.     Breath sounds: Normal breath sounds.  Abdominal:     General: Abdomen is flat. Bowel sounds are normal. There is no distension.      Palpations: Abdomen is soft.     Tenderness: There is no abdominal tenderness.  Musculoskeletal:        General: No swelling.     Cervical back: Neck supple.  Lymphadenopathy:     Cervical: No cervical adenopathy.  Skin:    General: Skin is warm and dry.     Findings: No rash.  Neurological:     General: No focal deficit present.     Mental Status: She is alert.  Psychiatric:        Mood and Affect: Mood normal.        Behavior: Behavior normal.     LABORATORY DATA:  CBC    Component Value Date/Time   WBC 5.7 08/11/2022 1051   WBC 5.8 04/27/2022 1446   RBC 4.18 08/11/2022 1051   HGB 12.8 08/11/2022 1051   HCT 38.9 08/11/2022 1051   PLT 166 08/11/2022 1051   MCV 93.1 08/11/2022 1051   MCH 30.6 08/11/2022 1051   MCHC 32.9 08/11/2022 1051   RDW 16.8 (H) 08/11/2022 1051   LYMPHSABS 1.2 08/11/2022 1051   MONOABS 0.5 08/11/2022 1051   EOSABS 0.1 08/11/2022 1051   BASOSABS 0.1 08/11/2022 1051    CMP     Component Value Date/Time   NA 142 08/11/2022 1051   K 3.8 08/11/2022 1051   CL 108 08/11/2022 1051   CO2 28 08/11/2022 1051   GLUCOSE 82 08/11/2022 1051   BUN 13 08/11/2022 1051   CREATININE 0.85 08/11/2022 1051   CREATININE 1.00 04/27/2022 1446   CALCIUM 9.1 08/11/2022 1051   PROT 6.3 (L) 08/11/2022 1051   ALBUMIN 4.4 08/11/2022 1051   AST 17 08/11/2022 1051   ALT 9 08/11/2022 1051   ALKPHOS 89 08/11/2022 1051   BILITOT 0.4 08/11/2022 1051   GFRNONAA >60 08/11/2022 1051   GFRNONAA 61 07/02/2020 1519   GFRAA 71 07/02/2020 1519        ASSESSMENT and THERAPY PLAN:   Iron deficiency anemia And is a 82 year old woman with iron deficiency anemia related  to chronic blood loss.  She recently received IV iron with good tolerance.    And's hemoglobin has improved to 12.8 today.  Her ferritin likely has improved as well.  Because she is still taking Eliquis she is has a slight risk of continued iron loss.  I recommended that we repeat CBC with differential,  ferritin, and iron studies every 12 weeks for the next year with follow-up in 1 year.  She will continue on oral iron.   She would like to get her labs completed when she sees Dr. Oneta Rack every 6 months, her primary care provider.  We will send him this office note to request this.     All questions were answered. The patient knows to call the clinic with any problems, questions or concerns. We can certainly see the patient much sooner if necessary.  Total encounter time:20 minutes*in face-to-face visit time, chart review, lab review, care coordination, order entry, and documentation of the encounter time.    Lillard Anes, NP 08/11/22 11:53 AM Medical Oncology and Hematology [facility] [address] [place], [place] [address] Tel. [phone]    Fax. [phone]  *Total Encounter Time as defined by the Centers for Medicare and Medicaid Services includes, in addition to the face-to-face time of a patient visit (documented in the note above) non-face-to-face time: obtaining and reviewing outside history, ordering and reviewing medications, tests or procedures, care coordination (communications with other health care professionals or caregivers) and documentation in the medical record.

## 2022-08-11 NOTE — Assessment & Plan Note (Addendum)
And is a 82 year old woman with iron deficiency anemia related to chronic blood loss.  She recently received IV iron with good tolerance.    And's hemoglobin has improved to 12.8 today.  Her ferritin likely has improved as well.  Because she is still taking Eliquis she is has a slight risk of continued iron loss.  I recommended that we repeat CBC with differential, ferritin, and iron studies every 12 weeks for the next year with follow-up in 1 year.  She will continue on oral iron.   She would like to get her labs completed when she sees Dr. Oneta Rack every 6 months, her primary care provider.  We will send him this office note to request this.

## 2022-08-11 NOTE — Progress Notes (Signed)
Annual Screening/Preventative Visit & Comprehensive Evaluation &  Examination   Future Appointments  Date Time Provider Department  08/12/2022 11:00 AM Lucky Cowboy, MD [facility]  04/19/2023 11:00 AM Adela Glimpse, NP [facility]        This very nice 82 y.o. DWF  with HTN, ASHD/Afib ( on Eliquis ) , HLD, Prediabetes  and Vitamin D Deficiency.presents for a Screening /Preventative Visit & comprehensive evaluation and management of multiple medical co-morbidities.  In Feb , patient was hospitalized for severe anemia & transfused & post hospital had EGD & Colonoscopy  (3 polyps) and she has continued on Protonix bid for presumed source of UGI bleed/Iron loss. She has received iv Iron infusion thru Dr Earmon Phoenix office.  Yesterday, patient's hgb was 212.5 gm%.  Dr Danielle Dess has followed patient for severe spinal stenosis by Lumbar MRI in Aug 2020.         HTN predates since  2009 when she was dx'd with a false (+) Myoview by a negative Heart Cath. In 2019 , she was dx'd with AFib , started on  Eliquis & is followed by Dr Jacinto Halim .  Patient's BP has been controlled at home and patient denies any cardiac symptoms as chest pain, palpitations, shortness of breath, dizziness or ankle swelling. Today's BP is elevated at 148/70.                             Marland Kitchen         Patient's hyperlipidemia is controlled with diet and medications. Patient denies myalgias or other medication SE's. Last lipids were at goal except elevated Trig's :  Lab Results  Component Value Date   CHOL 120 04/16/2022   HDL 45 (L) 04/16/2022   LDLCALC 56 04/16/2022   TRIG 108 04/16/2022   CHOLHDL 2.7 04/16/2022         Patient has hx/o prediabetes (A1c 5.9% /2013) and patient denies reactive hypoglycemic symptoms, visual blurring, diabetic polys or paresthesias. Last A1c was at goal :  Lab Results  Component Value Date   HGBA1C 5.5 04/16/2022         Finally, patient has history of Vitamin D Deficiency ("11" /2008) and  last Vitamin D was at goal (goal  70-100) :  Lab Results  Component Value Date   VD25OH 78 04/16/2022        Current Outpatient Medications  Medication Instructions   acetaminophen 500-1,000 mg Every 6 hours PRN   diazepam  5 MG tablet TAKE 1/2-1 TABLET 2 TO 3 TIMES DAILY    ELIQUIS 5 MG TABS tablet TAKE 1 TABLET TWICE DAILY    ferrous sulfate  325 mg Takes three times a week with food   gabapentin (NEURONTIN) 300 MG capsule Take  1 capsule  3 x /day   meclizine (ANTIVERT) 25 MG tablet 1/2-1 pill up to 3 times daily for motion sickness/dizziness   metoprolol tartrate  25 MG tablet TAKE 1 TABLET  DAILY    Multiple Vitamins-Minerals  1 tablet  Daily   pantoprazole  40 mg 2 times daily, Take  1 tablet  Daily  to Prevent Heartburn & Indigestion   TURMERIC PO 1 capsule, Oral, Daily   Vitamin D3 1,000-4,000 Units, Oral, Daily    No Known Allergies    Past Medical History:  Diagnosis Date   Anemia    Arthritis    Benign neoplasm of sigmoid colon    Deafness  left ear per pt    Dyspnea    pt reports with exertion due to hip and knees    Dysrhythmia    afib    History of bleeding ulcers    2019 received 3-4 units of blood    Hyperlipidemia    Hypertension    Pericardial effusion 02/04/2018   Pre-diabetes    per Dr Jacinto Halim note of 09/28/19 pt denies at preop visit on 10/06/19    Prediabetes    Primary localized osteoarthritis of right hip 09/26/2019   Pulmonary embolism ([name])    hx of 2019    Vertigo    Vitamin D deficiency     Health Maintenance  Topic Date Due   COVID-19 Vaccine (1) Never done   Zoster Vaccines- Shingrix (1 of 2) Never done   INFLUENZA VACCINE  10/14/2021   TETANUS/TDAP  01/25/2024   Pneumonia Vaccine  Completed   DEXA SCAN  Completed   Hepatitis C Screening  Completed   HPV VACCINES  Aged Out     Immunization History  Administered Date(s) Administered   DT  01/24/2014   Pneumococcal -13 05/26/2018   Pneumococcal -23 10/06/2011   Td  04/23/2003    Last EGD & Colon - 02/05/2018 - Dr Russella Dar - No f/u due to age.     Last MGM - 09/15/2012 - refuses further MGM.   Past Surgical History:  Procedure Laterality Date   BIOPSY  02/05/2018   BIOPSY;  Meryl Dare, MD   CATARACT EXTRACTION W/ INTRAOCULAR LENS  IMPLANT, BILATERAL  2009   CEREBRAL ANEURYSM REPAIR     COLONOSCOPY N/A 02/05/2018   COLONOSCOPY;  Meryl Dare, MD   ESOPHAGOGASTRODUODENOSCOPY N/A 02/05/2018   ESOPHAGOGASTRODUODENOSCOPY (EGD);  Meryl Dare, MD   FOOT SURGERY  1993   POLYPECTOMY  02/05/2018    POLYPECTOMY;  Meryl Dare, MD   TOTAL HIP ARTHROPLASTY Right 10/17/2019   TOTAL HIP ARTHROPLASTY ANTERIOR APPROACH;  Sheral Apley, MD   TUBAL LIGATION       Family History  Problem Relation Age of Onset   Hypertension Mother    Diabetes Mother    Cancer Mother        Pancreatic   Cancer Father        Skin     Social History   Tobacco Use   Smoking status: Former    Types: Cigarettes    Quit date: 08/07/1990    Years since quitting: 30.9   Smokeless tobacco: Never  Vaping Use   Vaping Use: Never used  Substance Use Topics   Alcohol use: No   Drug use: No      ROS Constitutional: Denies fever, chills, weight loss/gain, headaches, insomnia,  night sweats, and change in appetite. Does c/o fatigue. Eyes: Denies redness, blurred vision, diplopia, discharge, itchy, watery eyes.  ENT: Denies discharge, congestion, post nasal drip, epistaxis, sore throat, earache, hearing loss, dental pain, Tinnitus, Vertigo, Sinus pain, snoring.  Cardio: Denies chest pain, palpitations, irregular heartbeat, syncope, dyspnea, diaphoresis, orthopnea, PND, claudication, edema Respiratory: denies cough, dyspnea, DOE, pleurisy, hoarseness, laryngitis, wheezing.  Gastrointestinal: Denies dysphagia, heartburn, reflux, water brash, pain, cramps, nausea, vomiting, bloating, diarrhea, constipation, hematemesis, melena, hematochezia, jaundice,  hemorrhoids Genitourinary: Denies dysuria, frequency, urgency, nocturia, hesitancy, discharge, hematuria, flank pain Breast: Breast lumps, nipple discharge, bleeding.  Musculoskeletal: Denies arthralgia, myalgia, stiffness, Jt. Swelling, pain, limp, and strain/sprain. Denies falls. Skin: Denies puritis, rash, hives, warts, acne, eczema, changing in skin lesion Neuro:  No weakness, tremor, incoordination, spasms, paresthesia, pain Psychiatric: Denies confusion, memory loss, sensory loss. Denies Depression. Endocrine: Denies change in weight, skin, hair change, nocturia, and paresthesia, diabetic polys, visual blurring, hyper / hypo glycemic episodes.  Heme/Lymph: No excessive bleeding, bruising, enlarged lymph nodes.  Physical Exam  BP (!) 148/70   Pulse 63   Temp (!) 97.5 F (36.4 C)   Resp 16   Ht 5\' 5"  (1.651 m)   Wt 179 lb 6.4 oz (81.4 kg)   SpO2 95%   BMI 29.85 kg/m   General Appearance: Well nourished, well groomed and in no apparent distress.  Eyes: PERRLA, EOMs, conjunctiva no swelling or erythema, normal fundi and vessels. Sinuses: No frontal/maxillary tenderness ENT/Mouth: EACs patent / TMs  nl. Nares clear without erythema, swelling, mucoid exudates. Oral hygiene is good. No erythema, swelling, or exudate. Tongue normal, non-obstructing. Tonsils not swollen or erythematous. Hearing normal.  Neck: Supple, thyroid not palpable. No bruits, nodes or JVD. Respiratory: Respiratory effort normal.  BS equal and clear bilateral without rales, rhonci, wheezing or stridor. Cardio: Heart sounds are normal with regular rate and rhythm and no murmurs, rubs or gallops. Peripheral pulses are normal and equal bilaterally without edema. No aortic or femoral bruits. Chest: symmetric with normal excursions and percussion. Breasts: deferred to MGM Abdomen: Flat, soft with bowel sounds active. Nontender, no guarding, rebound, hernias, masses, or organomegaly.  Lymphatics: Non tender without  lymphadenopathy.  Genitourinary:  Musculoskeletal: Full ROM all peripheral extremities, joint stability, 5/5 strength, and normal gait. Skin: Warm and dry without rashes, lesions, cyanosis, clubbing or  ecchymosis.  Neuro: Cranial nerves intact, reflexes equal bilaterally. Normal muscle tone, no cerebellar symptoms. Sensation intact.  Pysch: Alert and oriented X 3, normal affect, Insight and Judgment appropriate.    Assessment and Plan  1. Annual Preventative Screening Examination   2. Essential hypertension  - EKG 12-Lead - Urinalysis, Routine w reflex microscopic - Microalbumin / creatinine urine ratio - CBC with Differential/Platelet - COMPLETE METABOLIC PANEL WITH GFR - Magnesium - TSH   3. Hyperlipidemia, mixed  - EKG 12-Lead - Lipid panel - TSH   4. Vitamin D deficiency  - VITAMIN D 25 Hydroxy    5. Abnormal glucose  - EKG 12-Lead - Hemoglobin A1c - Insulin, random   6. Paroxysmal atrial fibrillation ([name]). CHA2DS2-VASc Score is 4.  Y (A, HTN, F).     - EKG 12-Lead - TSH   7. Iron deficiency anemia due to chronic blood loss  - POC Hemoccult Bld/Stl (3-Cd Home Screen); Future - Iron, TIBC and Ferritin Panel - CBC with Differential/Platelet   8. Gastroesophageal reflux disease with esophagitis and hemorrhage  - CBC with Differential/Platelet   9. Screening for heart disease  - EKG 12-Lead   10. FH: hypertension  - EKG 12-Lead   11. Former smoker  - EKG 12-Lead   12. Thrombophilia ([name])  - CBC with Differential/Platelet   13. Medication management  - Urinalysis, Routine w reflex microscopic - Microalbumin / creatinine urine ratio - CBC with Differential/Platelet - COMPLETE METABOLIC PANEL WITH GFR - Magnesium - Lipid panel - TSH - Hemoglobin A1c - Insulin, random - VITAMIN D 25 Hydroxy          Patient was counseled in prudent diet to achieve/maintain BMI less than 25 for weight control, BP monitoring, regular exercise  and medications. Discussed med's effects and SE's. Screening labs and tests as requested with regular follow-up as recommended. Over 40 minutes of exam, counseling,  chart review and high complex critical decision making was performed.   Marinus Maw, MD

## 2022-08-12 ENCOUNTER — Ambulatory Visit (INDEPENDENT_AMBULATORY_CARE_PROVIDER_SITE_OTHER): Payer: PPO | Admitting: Internal Medicine

## 2022-08-12 ENCOUNTER — Encounter: Payer: Self-pay | Admitting: Internal Medicine

## 2022-08-12 ENCOUNTER — Encounter: Payer: Self-pay | Admitting: Hematology and Oncology

## 2022-08-12 VITALS — BP 148/70 | HR 63 | Temp 97.5°F | Resp 16 | Ht 65.0 in | Wt 179.4 lb

## 2022-08-12 DIAGNOSIS — K2101 Gastro-esophageal reflux disease with esophagitis, with bleeding: Secondary | ICD-10-CM

## 2022-08-12 DIAGNOSIS — Z136 Encounter for screening for cardiovascular disorders: Secondary | ICD-10-CM

## 2022-08-12 DIAGNOSIS — Z79899 Other long term (current) drug therapy: Secondary | ICD-10-CM | POA: Diagnosis not present

## 2022-08-12 DIAGNOSIS — D500 Iron deficiency anemia secondary to blood loss (chronic): Secondary | ICD-10-CM

## 2022-08-12 DIAGNOSIS — I10 Essential (primary) hypertension: Secondary | ICD-10-CM

## 2022-08-12 DIAGNOSIS — Z0000 Encounter for general adult medical examination without abnormal findings: Secondary | ICD-10-CM

## 2022-08-12 DIAGNOSIS — I480 Paroxysmal atrial fibrillation: Secondary | ICD-10-CM | POA: Diagnosis not present

## 2022-08-12 DIAGNOSIS — E559 Vitamin D deficiency, unspecified: Secondary | ICD-10-CM

## 2022-08-12 DIAGNOSIS — E782 Mixed hyperlipidemia: Secondary | ICD-10-CM | POA: Diagnosis not present

## 2022-08-12 DIAGNOSIS — Z8249 Family history of ischemic heart disease and other diseases of the circulatory system: Secondary | ICD-10-CM

## 2022-08-12 DIAGNOSIS — Z87891 Personal history of nicotine dependence: Secondary | ICD-10-CM

## 2022-08-12 DIAGNOSIS — Z0001 Encounter for general adult medical examination with abnormal findings: Secondary | ICD-10-CM

## 2022-08-12 DIAGNOSIS — R7309 Other abnormal glucose: Secondary | ICD-10-CM | POA: Diagnosis not present

## 2022-08-12 DIAGNOSIS — D6859 Other primary thrombophilia: Secondary | ICD-10-CM | POA: Diagnosis not present

## 2022-08-13 LAB — LIPID PANEL
Cholesterol: 137 mg/dL (ref <200)
HDL: 53 mg/dL (ref >=50)
LDL Cholesterol (Calc): 63 mg/dL (calc)
Non-HDL Cholesterol (Calc): 84 mg/dL (calc) (ref <130)
Total CHOL/HDL Ratio: 2.6 (calc) (ref <5.0)
Triglycerides: 126 mg/dL (ref <150)

## 2022-08-13 LAB — URINALYSIS, ROUTINE W REFLEX MICROSCOPIC
Bacteria, UA: NONE SEEN /HPF
Bilirubin Urine: NEGATIVE
Glucose, UA: NEGATIVE
Hgb urine dipstick: NEGATIVE
Hyaline Cast: NONE SEEN /LPF
Nitrite: NEGATIVE
Specific Gravity, Urine: 1.03 (ref 1.001–1.035)
pH: <=5 (ref 5.0–8.0)

## 2022-08-13 LAB — VITAMIN D 25 HYDROXY (VIT D DEFICIENCY, FRACTURES): Vit D, 25-Hydroxy: 61 ng/mL (ref 30–100)

## 2022-08-13 LAB — MICROSCOPIC MESSAGE

## 2022-08-13 LAB — MICROALBUMIN / CREATININE URINE RATIO
Creatinine, Urine: 265 mg/dL (ref 20–275)
Microalb Creat Ratio: 17 mg/g creat (ref <30)
Microalb, Ur: 4.5 mg/dL

## 2022-08-13 LAB — HEMOGLOBIN A1C
Hgb A1c MFr Bld: 5.5 % of total Hgb (ref <5.7)
Mean Plasma Glucose: 111 mg/dL
eAG (mmol/L): 6.2 mmol/L

## 2022-08-13 LAB — MAGNESIUM: Magnesium: 2.1 mg/dL (ref 1.5–2.5)

## 2022-08-13 LAB — TSH: TSH: 2.43 mIU/L (ref 0.40–4.50)

## 2022-08-13 LAB — INSULIN, RANDOM: Insulin: 18.3 u[IU]/mL

## 2022-08-13 NOTE — Progress Notes (Signed)
^<^<^<^<^<^<^<^<^<^<^<^<^<^<^<^<^<^<^<^<^<^<^<^<^<^<^<^<^<^<^<^<^<^<^<^<^ ^>^>^>^>^>^>^>^>^>^>^>>^>^>^>^>^>^>^>^>^>^>^>^>^>^>^>^>^>^>^>^>^>^>^>^>^>  -  Test results slightly outside the reference range are not unusual. If there is anything important, I will review this with you,  otherwise it is considered normal test values.  If you have further questions,  please do not hesitate to contact me at the office or via My Chart.   ^<^<^<^<^<^<^<^<^<^<^<^<^<^<^<^<^<^<^<^<^<^<^<^<^<^<^<^<^<^<^<^<^<^<^<^<^ ^>^>^>^>^>^>^>^>^>^>^>^>^>^>^>^>^>^>^>^>^>^>^>^>^>^>^>^>^>^>^>^>^>^>^>^>^  -  Chol = 137 and LDL = 63  - Both  Excellent   - Very low risk for Heart Attack  / Stroke ^<^<^<^<^<^<^<^<^<^<^<^<^<^<^<^<^<^<^<^<^<^<^<^<^<^<^<^<^<^<^<^<^<^<^<^<^ ^>^>^>^>^>^>^>^>^>^>^>^>^>^>^>^>^>^>^>^>^>^>^>^>^>^>^>^>^>^>^>^>^>^>^>^>^  -  A1c - Normal - No Diabetes - Great !   ^<^<^<^<^<^<^<^<^<^<^<^<^<^<^<^<^<^<^<^<^<^<^<^<^<^<^<^<^<^<^<^<^<^<^<^<^ ^>^>^>^>^>^>^>^>^>^>^>^>^>^>^>^>^>^>^>^>^>^>^>^>^>^>^>^>^>^>^>^>^>^>^>^>^  -  Vitamin D = 61 - Excekllent Level  - Please keep dosage same   ^<^<^<^<^<^<^<^<^<^<^<^<^<^<^<^<^<^<^<^<^<^<^<^<^<^<^<^<^<^<^<^<^<^<^<^<^ ^>^>^>^>^>^>^>^>^>^>^>^>^>^>^>^>^>^>^>^>^>^>^>^>^>^>^>^>^>^>^>^>^>^>^>^>^  -  All Else - CBC - Kidneys - Electrolytes - Liver - Magnesium & Thyroid    - all  Normal / OK ^<^<^<^<^<^<^<^<^<^<^<^<^<^<^<^<^<^<^<^<^<^<^<^<^<^<^<^<^<^<^<^<^<^<^<^<^ ^>^>^>^>^>^>^>^>^>^>^>^>^>^>^>^>^>^>^>^>^>^>^>^>^>^>^>^>^>^>^>^>^>^>^>^>^

## 2022-08-14 ENCOUNTER — Telehealth: Payer: Self-pay | Admitting: Adult Health

## 2022-08-14 NOTE — Telephone Encounter (Signed)
Scheduled appointment per 5/28 los. Left voicemail.

## 2022-08-17 ENCOUNTER — Other Ambulatory Visit: Payer: Self-pay | Admitting: Nurse Practitioner

## 2022-08-17 ENCOUNTER — Other Ambulatory Visit: Payer: Self-pay | Admitting: Internal Medicine

## 2022-08-17 DIAGNOSIS — Z79899 Other long term (current) drug therapy: Secondary | ICD-10-CM

## 2022-08-17 DIAGNOSIS — R42 Dizziness and giddiness: Secondary | ICD-10-CM

## 2022-10-08 ENCOUNTER — Ambulatory Visit: Payer: PPO | Admitting: Nurse Practitioner

## 2022-10-28 ENCOUNTER — Encounter: Payer: Self-pay | Admitting: Internal Medicine

## 2022-11-12 ENCOUNTER — Ambulatory Visit (INDEPENDENT_AMBULATORY_CARE_PROVIDER_SITE_OTHER): Payer: PPO | Admitting: Nurse Practitioner

## 2022-11-12 ENCOUNTER — Encounter: Payer: Self-pay | Admitting: Nurse Practitioner

## 2022-11-12 VITALS — BP 134/80 | HR 57 | Temp 97.8°F | Ht 65.0 in | Wt 182.6 lb

## 2022-11-12 DIAGNOSIS — E782 Mixed hyperlipidemia: Secondary | ICD-10-CM

## 2022-11-12 DIAGNOSIS — Z86711 Personal history of pulmonary embolism: Secondary | ICD-10-CM

## 2022-11-12 DIAGNOSIS — R42 Dizziness and giddiness: Secondary | ICD-10-CM | POA: Diagnosis not present

## 2022-11-12 DIAGNOSIS — R7309 Other abnormal glucose: Secondary | ICD-10-CM

## 2022-11-12 DIAGNOSIS — D500 Iron deficiency anemia secondary to blood loss (chronic): Secondary | ICD-10-CM | POA: Diagnosis not present

## 2022-11-12 DIAGNOSIS — K219 Gastro-esophageal reflux disease without esophagitis: Secondary | ICD-10-CM

## 2022-11-12 DIAGNOSIS — I83813 Varicose veins of bilateral lower extremities with pain: Secondary | ICD-10-CM

## 2022-11-12 DIAGNOSIS — L089 Local infection of the skin and subcutaneous tissue, unspecified: Secondary | ICD-10-CM

## 2022-11-12 DIAGNOSIS — I10 Essential (primary) hypertension: Secondary | ICD-10-CM | POA: Diagnosis not present

## 2022-11-12 DIAGNOSIS — S60931A Unspecified superficial injury of right thumb, initial encounter: Secondary | ICD-10-CM | POA: Diagnosis not present

## 2022-11-12 DIAGNOSIS — Z79899 Other long term (current) drug therapy: Secondary | ICD-10-CM | POA: Diagnosis not present

## 2022-11-12 DIAGNOSIS — D692 Other nonthrombocytopenic purpura: Secondary | ICD-10-CM | POA: Diagnosis not present

## 2022-11-12 DIAGNOSIS — I482 Chronic atrial fibrillation, unspecified: Secondary | ICD-10-CM | POA: Diagnosis not present

## 2022-11-12 DIAGNOSIS — E559 Vitamin D deficiency, unspecified: Secondary | ICD-10-CM | POA: Diagnosis not present

## 2022-11-12 DIAGNOSIS — E663 Overweight: Secondary | ICD-10-CM | POA: Diagnosis not present

## 2022-11-12 MED ORDER — MECLIZINE HCL 25 MG PO TABS: ORAL_TABLET | ORAL | 0 refills | Status: DC

## 2022-11-12 MED ORDER — DOXYCYCLINE HYCLATE 100 MG PO CAPS
ORAL_CAPSULE | ORAL | 0 refills | Status: DC
Start: 2022-11-12 — End: 2023-02-15

## 2022-11-12 NOTE — Progress Notes (Signed)
MONTH FOLLOW UP  Assessment:   Essential hypertension Controlled  Continue Metoprolol Continue medications;  Discussed DASH (Dietary Approaches to Stop Hypertension) DASH diet is lower in sodium than a typical American diet. Cut back on foods that are high in saturated fat, cholesterol, and trans fats. Eat more whole-grain foods, fish, poultry, and nuts Remain active and exercise as tolerated daily.  Monitor BP at home-Call if greater than 130/80.   Chronic atrial fibrillation ([name])/ thrombophilia ([name]) Follows with Cardiology, Dr. Jacinto Halim. Rate controlled, on NOAC for chadsvasc of 4 Continue anticoagulant Apixaban, Metoprolol  History of PE Continue Eliquis. 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 Recommended diet heavy in fruits and veggies, omega 3's. Decrease consumption of animal meats, cheeses, and dairy products. Remain active and exercise as tolerated. Continue to monitor.  Iron deficiency due to chronic blood loss  Follows Dr. Pamelia Hoit, Oncology Monitor CBC, iron panel; anticipate chronic iron replacement needs secondary to esophagitis and cameron's lesion  Abnormal glucose Education: Reviewed 'ABCs' of diabetes management  A1C (<7) Blood pressure (<130/80) Cholesterol (LDL <70) Continue Eye Exam yearly  Continue Dental Exam Q6 mo Discussed dietary recommendations Discussed Physical Activity recommendations Monitor A1C  Vitamin D deficiency Continue supplement Check Vitamin D levels  Overweight  Discussed appropriate BMI Diet modification. Physical activity. Encouraged/praised to build confidence.  Senile purpura ([name]) R/t eliquis and age; Discussed process, protect skin, sunscreen  Varicose veins Discussed weight loss discussed, continue compression stockings and elevation  GERD/cameron  lesions Continue Pantoprazole. No suspected reflux complications (Barret/stricture). Lifestyle modification:  wt loss, avoid meals 2-3h before bedtime. Consider eliminating food triggers:  chocolate, caffeine, EtOH, acid/spicy food.  Medication Management All medications discussed and reviewed in full. All questions and concerns regarding medications addressed.    Right thumb injury/infection Start Doxycycline as directed Continue to monitor for increase in redness, streaking, swelling pain, fever, chills, N/V     Contact office if s/s fail to improve.  Orders Placed This Encounter  Procedures   CBC with Differential/Platelet   COMPLETE METABOLIC PANEL WITH GFR   Lipid panel   Hemoglobin A1c   Meds ordered this encounter  Medications   doxycycline (VIBRAMYCIN) 100 MG capsule    Sig: Take 1 capsule 2 x /day with meals for Infection    Dispense:  60 capsule    Refill:  0    Order Specific Question:   Supervising Provider    Answer:   Lucky Cowboy [[id number]]   meclizine (ANTIVERT) 25 MG tablet    Sig: 1/2-1 pill up to 3 times daily for motion sickness/dizziness    Dispense:  30 tablet    Refill:  0   Notify office for further evaluation and treatment, questions or concerns if any reported s/s fail to improve.   The patient was advised to call back or seek an in-person evaluation if any symptoms worsen or if the condition fails to improve as anticipated.   Further disposition pending results of labs. Discussed med's effects and SE's.    I discussed the assessment and treatment plan with the patient. The patient was provided an opportunity to ask questions and all were answered. The patient agreed with the plan and demonstrated an understanding of the instructions.  Discussed med's effects and SE's. Screening labs and tests as requested with regular follow-up as recommended.  I provided 25 minutes of face-to-face time  during this encounter including counseling, chart review,  and critical decision making was preformed.  Today's Plan of Care is based on a patient-centered health care approach known as shared decision making - the decisions, tests and treatments allow for patient preferences and values to be balanced with clinical evidence.    Future Appointments  Date Time Provider Department Center  07/09/2022 10:00 AM Lucky Cowboy, MD [facility] None  04/19/2023 11:00 AM Adela Glimpse, NP [facility] None    Subjective:  Lori [name] is a 82 y.o. female who presents for general 3 month follow up  She reports over all she is doing well.  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  She does report having a fall down an embankment x3 weeks ago while weed eating her grass.  She reports no injuries other than a right thumb laceration.  States that she treatedd with homeopathic apinoil.  She continues to have mild erythema, edema and pain.  She is able to move thumb.  Denies fever, chills, N/V.    She s/p TRH 10/17/19 and reports she is doing very well, she completed PT.  She ambulates with a cane.  Reports she is having some sciatic pains now on left side and working to improve this with Ortho, Dr Eulah Pont.  She continues to follow as scheduled.  Patient was hospitalized in Nov 2019  with a severe GI bleed (hgb 5 gm%) requiring  pRBC transfusions and found to have esophagitis, erosive gastropathy, Cameron lesion and a small colon polyp & was initiated on PPI. CBCs and iron levels have since resolved to normal range, continues on daily iron. Denies SOB, syncope, fatigue.  Saw Hematology on 10/30/21 for tmt of IDA then followed up again on 01/2022 for IV iron infusions.  She is to continue to consult with Dr. Loreta Ave with gastroenterology for upper endoscopy and colonoscopy.  She takes CBD oil PRN anxiety and LE pain, uses valium rarelly, tylenol as well for pain with aleve occasionally and reports she is managing well for vertigo and anxiety.    BMI is Body mass index is 30.29 kg/m., she has not been working on diet and exercise.   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 She denies chest pain, shortness of breath, dizziness. In 2009, she had a false (+) Myoview confirmed by a Negative Heart Cath. Last Echo 09/13/19, EF 69%  She has history of Afib, CHA2DS2VASC 4, declined anticoag/NOAC for many years until recent PE and now doing well on eliquis.  She follows with Cardiology.   She is not on cholesterol medication and denies myalgias. Her LDL cholesterol is at goal. The cholesterol last visit was:   Lab Results  Component Value Date   CHOL 137 08/12/2022   HDL 53 08/12/2022   LDLCALC 63 08/12/2022   TRIG 126 08/12/2022   CHOLHDL 2.6 08/12/2022      She has been working on diet and exercise for prediabetes, and denies polydipsia, polyuria and visual disturbances. Last A1C in the office was:  Lab Results  Component Value Date   HGBA1C 5.5 08/12/2022    She has had episodes of AKI that did  improve with cessation of diuretic/NSAID and advised increased water. Lab Results  Component Value Date   EGFR 57 (L) 04/27/2022   Patient is on Vitamin D supplement.   Lab Results  Component Value Date   VD25OH 61 08/12/2022   Medications Current Outpatient Medications (Cardiovascular):    metoprolol tartrate (LOPRESSOR) 25 MG tablet, Take  1 tablet  Daily  for BP & Heart   triamterene-hydrochlorothiazide (MAXZIDE-25) 37.5-25 MG tablet, Take 1 tablet by mouth as needed. (Patient not taking: Reported on 04/16/2022)  Current Outpatient Medications (Respiratory):    umeclidinium-vilanterol (ANORO ELLIPTA) 62.5-25 MCG/ACT AEPB, Inhale 1 puff into the lungs daily.  Current Outpatient  Medications (Analgesics):    acetaminophen (TYLENOL) 500 MG tablet, Take 1,000 mg by mouth every 6 (six) hours as needed for mild pain or moderate pain.  Current Outpatient Medications (Hematological):    apixaban (ELIQUIS) 5 MG TABS tablet, Take  1 tablet  2 x /day  to Prevent Blood Clots / Patient knows to take by mouth   ferrous sulfate 325 (65 FE) MG tablet, Take 325 mg by mouth daily with breakfast.  Current Outpatient Medications (Other):    Cholecalciferol 25 MCG (1000 UT) capsule, Take 1,000 Units by mouth daily.    [employer] MAGNESIUM PO, Take by mouth. Brand is Maganese   diazepam (VALIUM) 5 MG tablet, TAKE 1/2-1 TABLET BY MOUTH 2 TO 3 TIMES DAILY FOR DIZZINESS/VERTIGO   gabapentin (NEURONTIN) 300 MG capsule, Take  1 capsule  3 x /day for Nerve Pain                                                  /                    TAKE                           BY                     MOUTH   pantoprazole (PROTONIX) 40 MG tablet, Take  1 tablet  Daily  to Prevent Heartburn & Indigestion   No Known Allergies  Current Problems (verified) Patient Active Problem List   Diagnosis Date Noted   Thrombophilia ([name]) 04/20/2019   Spinal stenosis of lumbar region 04/20/2019   Iron deficiency anemia due to chronic blood loss 03/08/2019   Varicose vein of leg 09/01/2018   Gastroesophageal reflux disease with esophagitis    History of pulmonary embolism (01/2018) 02/04/2018   Senile purpura ([name]) 07/20/2017   Atrial fibrillation ([name]) 06/16/2017   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) 02/06/2015   Obesity (BMI 30.0-34.9) 02/06/2015   Medication management 11/18/2013   Hyperlipidemia, mixed    Vitamin D deficiency    Essential hypertension    Screening Tests Immunization History  Administered Date(s) Administered   DT (Pediatric) 01/24/2014   Pneumococcal Conjugate-13 05/26/2018   Pneumococcal Polysaccharide-23 10/06/2011   Td 04/23/2003   Patient Care Team: Lucky Cowboy, MD as PCP - General  (Internal Medicine) Lucky Cowboy, MD as PCP - Internal Medicine (Internal Medicine) Ernesto Rutherford, MD as Consulting Physician (Ophthalmology) Meryl Dare, MD as Consulting Physician (Gastroenterology) Charlett Nose, [facility] (Inactive) as Pharmacist (Pharmacist)  SURGICAL HISTORY She  has a past surgical history that includes Tubal  ligation; Cerebral aneurysm repair; Cataract extraction w/ intraocular lens  implant, bilateral (2009); Foot surgery (1993); Colonoscopy (N/A, 02/05/2018); Esophagogastroduodenoscopy (N/A, 02/05/2018); biopsy (02/05/2018); polypectomy (02/05/2018); and Total hip arthroplasty (Right, 10/17/2019). FAMILY HISTORY Her family history includes Cancer in her father and mother; Diabetes in her mother; Hypertension in her mother. SOCIAL HISTORY She  reports that she quit smoking about 31 years ago. Her smoking use included cigarettes. She has never used smokeless tobacco. She reports that she does not drink alcohol and does not use drugs.   Review of Systems  Constitutional: Negative.  Negative for malaise/fatigue and weight loss.  HENT: Negative.  Negative for hearing loss and tinnitus.   Eyes: Negative.  Negative for blurred vision and double vision.  Respiratory: Negative.  Negative for cough, sputum production, shortness of breath and wheezing.   Cardiovascular: Negative.  Negative for chest pain, palpitations, orthopnea, claudication, leg swelling and PND.  Gastrointestinal: Negative.  Negative for abdominal pain, blood in stool, constipation, diarrhea, heartburn, melena, nausea and vomiting.  Genitourinary: Negative.   Musculoskeletal:  Positive for joint pain (Left hip). Negative for falls and myalgias.  Skin: Negative.  Negative for rash.  Neurological:  Negative for dizziness, tingling, sensory change, weakness and headaches.  Endo/Heme/Allergies:  Negative for polydipsia.  Psychiatric/Behavioral: Negative.  Negative for depression, memory loss, substance  abuse and suicidal ideas. The patient is not nervous/anxious and does not have insomnia.   All other systems reviewed and are negative.    Objective:     Today's Vitals   11/12/22 1149  BP: 134/80  Pulse: (!) 57  Temp: 97.8 F (36.6 C)  SpO2: 97%  Weight: 182 lb 9.6 oz (82.8 kg)  Height: 5\' 5"  (1.651 m)   Body mass index is 30.39 kg/m.  General appearance: alert, no distress, WD/WN, female HEENT: normocephalic, sclerae anicteric, TMs pearly, nares patent, no discharge or erythema, pharynx normal Oral cavity: MMM, no lesions Neck: supple, no lymphadenopathy, no thyromegaly, no masses Heart: RRR, normal S1, S2, no murmurs Lungs: CTA bilaterally, no wheezes, rhonchi, or rales Abdomen: +bs, soft, non tender, non distended, no masses, no hepatomegaly, no splenomegaly Musculoskeletal: nontender, no swelling, very slow sitting to standing, slow antalgic gait, bil wrists with bony enlargement without effusion Extremities: Right thumb with mild edema along DIP and PIP, mild erythema, mild ecchymosis.  Tender to palpation. FROM. Pulses: 2+ symmetric, upper and lower extremities, normal cap refill Neurological: alert, oriented x 3, CN2-12 intact, strength normal upper extremities and lower extremities, sensation normal throughout, DTRs 2+ throughout, no cerebellar signs, gait antalgic Psychiatric: normal affect, behavior normal, pleasant    Philander Ake, NP   04/03/2020

## 2022-11-12 NOTE — Patient Instructions (Signed)
Doxycycline Capsules or Tablets What is this medication? DOXYCYCLINE (dox i SYE kleen) treats infections caused by bacteria. It belongs to a group of medications called tetracycline antibiotics. It will not treat colds, the flu, or infections caused by viruses. This medicine may be used for other purposes; ask your health care provider or pharmacist if you have questions. COMMON BRAND NAME(S): Acticlate, Adoxa, Adoxa CK, Adoxa Pak, Adoxa TT, Alodox, Avidoxy, Doxal, LYMEPAK, Mondoxyne NL, Monodox, Morgidox 1x, Morgidox 1x Kit, Morgidox 2x, Morgidox 2x Kit, NutriDox, Ocudox, [place], [place], [place], Vibra-Tabs, Vibramycin What should I tell my care team before I take this medication? They need to know if you have any of these conditions: Kidney disease Liver disease Long exposure to sunlight like working outdoors Recent stomach surgery Stomach or intestine problems, such as colitis Vision problems Yeast or fungal infection of the mouth or vagina An unusual or allergic reaction to doxycycline, other medications, foods, dyes, or preservatives Pregnant or trying to get pregnant Breastfeeding How should I use this medication? Take this medication by mouth with water. Take it as directed on the prescription label at the same time every day. It is best to take this medication without food, but if it upsets your stomach take it with food. Take all of this medication unless your care team tells you to stop it early. Keep taking it even if you think you are better. Take antacids and products with aluminum, calcium, magnesium, iron, and zinc in them at a different time of day than this medication. Talk to your care team if you have questions. Talk to your care team about the use of this medication in children. While it may be prescribed for children for selected conditions, precautions do apply. Overdosage: If you think you have taken too much of this medicine contact a poison control center or emergency  room at once. NOTE: This medicine is only for you. Do not share this medicine with others. What if I miss a dose? If you miss a dose, take it as soon as you can. If it is almost time for your next dose, take only that dose. Do not take double or extra doses. What may interact with this medication? Antacids, vitamins, or other products that contain aluminum, calcium, iron, magnesium, or zinc Barbiturates Bismuth subsalicylate Carbamazepine Estrogen or progestin hormones Methoxyflurane Oral retinoids, such as acitretin, isotretinoin Other antibiotics Phenytoin Warfarin This list may not describe all possible interactions. Give your health care provider a list of all the medicines, herbs, non-prescription drugs, or dietary supplements you use. Also tell them if you smoke, drink alcohol, or use illegal drugs. Some items may interact with your medicine. What should I watch for while using this medication? Tell your care team if your symptoms do not improve. Do not treat diarrhea with over the counter products. Contact your care team if you have diarrhea that lasts more than 2 days or if it is severe and watery. Do not take this medication just before going to bed. It may not dissolve properly when you lay down and can cause pain in your throat. Drink plenty of fluids while taking this medication to also help reduce irritation in your throat. This medication can make you more sensitive to the sun. Keep out of the sun. If you cannot avoid being in the sun, wear protective clothing and sunscreen. Do not use sun lamps, tanning beds, or tanning booths. Estrogen and progestin hormones may not work as well while you are taking this medication.  A barrier contraceptive, such as a condom or diaphragm, is recommended if you are using these hormones for contraception. Talk to your care team about effective forms of contraception. If you are being treated for a sexually transmitted infection (STI), avoid sexual  contact until you have finished your treatment. Your sexual partner may also need treatment. If you are using this medication to prevent malaria, you should still protect yourself from contact with mosquitos. Stay in screened-in areas, use mosquito nets, keep your body covered, and use an insect repellent. What side effects may I notice from receiving this medication? Side effects that you should report to your care team as soon as possible: Allergic reactions--skin rash, itching, hives, swelling of the face, lips, tongue, or throat Increased pressure around the brain--severe headache, change in vision, blurry vision, nausea, vomiting Joint pain Pain or trouble swallowing Redness, blistering, peeling, or loosening of the skin, including inside the mouth Severe diarrhea, fever Unusual vaginal discharge, itching, or odor Side effects that usually do not require medical attention (report these to your care team if they continue or are bothersome): Change in tooth color Diarrhea Headache Heartburn Nausea This list may not describe all possible side effects. Call your doctor for medical advice about side effects. You may report side effects to FDA at 1-800-FDA-1088. Where should I keep my medication? Keep out of the reach of children and pets. Store at room temperature, below 30 degrees C (86 degrees F). Protect from light. Keep container tightly closed. Throw away any unused medication after the expiration date. Taking this medication after the expiration date can make you seriously ill. NOTE: This sheet is a summary. It may not cover all possible information. If you have questions about this medicine, talk to your doctor, pharmacist, or health care provider.  2024 Elsevier/Gold Standard (2021-12-03 00:00:00)

## 2022-11-13 LAB — HEMOGLOBIN A1C
Hgb A1c MFr Bld: 5.8 %{Hb} — ABNORMAL HIGH (ref <5.7)
Mean Plasma Glucose: 120 mg/dL
eAG (mmol/L): 6.6 mmol/L

## 2022-11-13 LAB — COMPLETE METABOLIC PANEL WITH GFR
AG Ratio: 2 (calc) (ref 1.0–2.5)
ALT: 8 U/L (ref 6–29)
AST: 14 U/L (ref 10–35)
Albumin: 4.3 g/dL (ref 3.6–5.1)
Alkaline phosphatase (APISO): 95 U/L (ref 37–153)
BUN/Creatinine Ratio: 24 (calc) — ABNORMAL HIGH (ref 6–22)
BUN: 24 mg/dL (ref 7–25)
CO2: 29 mmol/L (ref 20–32)
Calcium: 9.3 mg/dL (ref 8.6–10.4)
Chloride: 107 mmol/L (ref 98–110)
Creat: 1 mg/dL — ABNORMAL HIGH (ref 0.60–0.95)
Globulin: 2.1 g/dL (ref 1.9–3.7)
Glucose, Bld: 64 mg/dL — ABNORMAL LOW (ref 65–99)
Potassium: 4.8 mmol/L (ref 3.5–5.3)
Sodium: 144 mmol/L (ref 135–146)
Total Bilirubin: 0.4 mg/dL (ref 0.2–1.2)
Total Protein: 6.4 g/dL (ref 6.1–8.1)
eGFR: 57 mL/min/{1.73_m2} — ABNORMAL LOW (ref >=60)

## 2022-11-13 LAB — LIPID PANEL
Cholesterol: 168 mg/dL (ref <200)
HDL: 54 mg/dL (ref >=50)
LDL Cholesterol (Calc): 88 mg/dL
Non-HDL Cholesterol (Calc): 114 mg/dL (ref <130)
Total CHOL/HDL Ratio: 3.1 (calc) (ref <5.0)
Triglycerides: 154 mg/dL — ABNORMAL HIGH (ref <150)

## 2022-11-13 LAB — CBC WITH DIFFERENTIAL/PLATELET
Absolute Monocytes: 676 {cells}/uL (ref 200–950)
Basophils Absolute: 62 {cells}/uL (ref 0–200)
Basophils Relative: 0.9 %
Eosinophils Absolute: 69 {cells}/uL (ref 15–500)
Eosinophils Relative: 1 %
HCT: 39.3 % (ref 35.0–45.0)
Hemoglobin: 12.8 g/dL (ref 11.7–15.5)
Lymphs Abs: 1525 {cells}/uL (ref 850–3900)
MCH: 29.6 pg (ref 27.0–33.0)
MCHC: 32.6 g/dL (ref 32.0–36.0)
MCV: 91 fL (ref 80.0–100.0)
MPV: 13 fL — ABNORMAL HIGH (ref 7.5–12.5)
Monocytes Relative: 9.8 %
Neutro Abs: 4568 {cells}/uL (ref 1500–7800)
Neutrophils Relative %: 66.2 %
Platelets: 214 10*3/uL (ref 140–400)
RBC: 4.32 10*6/uL (ref 3.80–5.10)
RDW: 13.5 % (ref 11.0–15.0)
Total Lymphocyte: 22.1 %
WBC: 6.9 10*3/uL (ref 3.8–10.8)

## 2022-12-03 DIAGNOSIS — H40053 Ocular hypertension, bilateral: Secondary | ICD-10-CM | POA: Diagnosis not present

## 2022-12-03 DIAGNOSIS — Z961 Presence of intraocular lens: Secondary | ICD-10-CM | POA: Diagnosis not present

## 2022-12-30 ENCOUNTER — Emergency Department
Admission: EM | Admit: 2022-12-30 | Discharge: 2022-12-30 | Disposition: A | Discharge status: Home / Self Care | Payer: Medicare Other | Attending: PHYSICIAN ASSISTANT | Admitting: PHYSICIAN ASSISTANT

## 2022-12-30 ENCOUNTER — Other Ambulatory Visit: Payer: Self-pay

## 2022-12-30 ENCOUNTER — Emergency Department (HOSPITAL_COMMUNITY): Payer: Medicare Other

## 2022-12-30 ENCOUNTER — Encounter (HOSPITAL_COMMUNITY): Payer: Self-pay

## 2022-12-30 DIAGNOSIS — B029 Zoster without complications: Secondary | ICD-10-CM | POA: Insufficient documentation

## 2022-12-30 DIAGNOSIS — M542 Cervicalgia: Secondary | ICD-10-CM | POA: Insufficient documentation

## 2022-12-30 LAB — COMPREHENSIVE METABOLIC PANEL, NON-FASTING
ALBUMIN: 3.8 g/dL (ref 3.4–4.8)
ALKALINE PHOSPHATASE: 137 U/L (ref 55–145)
ALT (SGPT): 19 U/L (ref <31)
ANION GAP: 12 mmol/L (ref 4–13)
AST (SGOT): 23 U/L (ref 11–34)
BILIRUBIN TOTAL: 0.5 mg/dL (ref 0.3–1.3)
BUN/CREA RATIO: 20 (ref 6–22)
BUN: 22 mg/dL (ref 8–25)
CALCIUM: 9.6 mg/dL (ref 8.6–10.3)
CHLORIDE: 106 mmol/L (ref 96–111)
CO2 TOTAL: 23 mmol/L (ref 23–31)
CREATININE: 1.1 mg/dL — ABNORMAL HIGH (ref 0.60–1.05)
ESTIMATED GFR - FEMALE: 51 mL/min/BSA — ABNORMAL LOW (ref >=60)
GLUCOSE: 155 mg/dL — ABNORMAL HIGH (ref 65–125)
POTASSIUM: 4.5 mmol/L (ref 3.5–5.1)
PROTEIN TOTAL: 7.3 g/dL (ref 6.0–8.0)
SODIUM: 141 mmol/L (ref 136–145)

## 2022-12-30 LAB — CBC WITH DIFF
BASOPHIL #: <0.1 10*3/uL (ref <=0.20)
BASOPHIL %: 0.7 %
EOSINOPHIL #: 0.1 10*3/uL (ref <=0.50)
EOSINOPHIL %: 1.4 %
HCT: 32.8 % — ABNORMAL LOW (ref 34.8–46.0)
HGB: 10.3 g/dL — ABNORMAL LOW (ref 11.5–16.0)
IMMATURE GRANULOCYTE #: <0.1 10*3/uL (ref <0.10)
IMMATURE GRANULOCYTE %: 0.3 % (ref 0.0–1.0)
LYMPHOCYTE #: 0.99 10*3/uL — ABNORMAL LOW (ref 1.00–4.80)
LYMPHOCYTE %: 13.6 %
MCH: 24.6 pg — ABNORMAL LOW (ref 26.0–32.0)
MCHC: 31.4 g/dL (ref 31.0–35.5)
MCV: 78.5 fL (ref 78.0–100.0)
MONOCYTE #: 0.37 10*3/uL (ref 0.20–1.10)
MONOCYTE %: 5.1 %
MPV: 10.9 fL (ref 8.7–12.5)
NEUTROPHIL #: 5.76 10*3/uL (ref 1.50–7.70)
NEUTROPHIL %: 78.9 %
PLATELETS: 223 10*3/uL (ref 150–400)
RBC: 4.18 10*6/uL (ref 3.85–5.22)
RDW-CV: 14.6 % (ref 11.5–15.5)
WBC: 7.3 10*3/uL (ref 3.7–11.0)

## 2022-12-30 LAB — BLUE TOP TUBE

## 2022-12-30 LAB — GOLD TOP TUBE

## 2022-12-30 LAB — GRAY TOP TUBE

## 2022-12-30 MED ORDER — VALACYCLOVIR 1 GRAM TABLET
1000.0000 mg | ORAL_TABLET | Freq: Three times a day (TID) | ORAL | 0 refills | Status: AC
Start: 2022-12-30 — End: 2023-01-06

## 2022-12-30 MED ORDER — GABAPENTIN 100 MG CAPSULE
100.0000 mg | ORAL_CAPSULE | ORAL | Status: AC
Start: 2022-12-30 — End: 2022-12-30
  Administered 2022-12-30: 100 mg via ORAL
  Filled 2022-12-30: qty 1

## 2022-12-30 MED ORDER — IOPAMIDOL 300 MG IODINE/ML (61 %) INTRAVENOUS SOLUTION
100.0000 mL | INTRAVENOUS | Status: AC
Start: 2022-12-30 — End: 2022-12-30
  Administered 2022-12-30: 100 mL via INTRAVENOUS
  Filled 2022-12-30: qty 100

## 2022-12-30 MED ORDER — GABAPENTIN 100 MG CAPSULE
100.0000 mg | ORAL_CAPSULE | Freq: Three times a day (TID) | ORAL | 0 refills | Status: AC
Start: 2022-12-30 — End: 2023-01-04

## 2022-12-30 MED ORDER — VALACYCLOVIR 500 MG TABLET
1000.0000 mg | ORAL_TABLET | ORAL | Status: AC
Start: 2022-12-30 — End: 2022-12-30
  Administered 2022-12-30: 1000 mg via ORAL
  Filled 2022-12-30: qty 2

## 2022-12-30 NOTE — ED Nurses Note (Signed)
Patient discharged from ED. Patient is at baseline mentation and VSS/baseline. AVS reviewed with patient/care giver. A written copy of the AVS and discharge instructions was given to the patient/care giver. Patient/care giver instructed to pick up any prescribed medication from preferred pharmacy as soon as possible. Questions sufficiently answered as needed. Patient/care giver encouraged to follow up with PCP as indicated. In the event of an emergency, patient/care giver instructed to call 911 or go to the nearest emergency room. Patient declined wheelchair and is ambulatory out of ED alongside her daughter.

## 2022-12-30 NOTE — ED Provider Notes (Signed)
Melissa Wolf  DEPARTMENT OF EMERGENCY MEDICINE  EMERGENCY DEPARTMENT HISTORY AND PHYSICAL      Chief Complaint:    Patient is a 82 y.o.  female presenting to the ED with chief complaint of neck pain on skin that radiates up into the back of her head.     History of Present Illness:    Patient is complaining of neck pain for the last 3 days.  Patient has a notable rash on the anterior surface of her neck that goes around her neck and up into her hairline.  Patient states that the rash is very painful.  Patient has been trying to apply lotions to the rash but has stopped because of the pain associated with the lotions being applied.  Patient states that long time ago she had shingles on her shoulder but has not had an outbreak in several years.  Patient states because of the pain it makes it hard for her to swallow.  Patient does not have any notable pharyngitis.  CT scan of the neck did not show any foreign body or anything remarkable on scan.  Patient will be treated with a and for a shingles outbreak with Valtrex 1000 mg 3 times a day and gabapentin 100 mg 3 times a day for pain control.  Other than above-stated complaints patient denies any chest pain, shortness of breath, fever, chills, nausea or emesis        Past Medical History:  Past Medical History:   Diagnosis Date    Atrial ectopy     CAD (coronary artery disease)     Diabetes mellitus, type 2 (CMS [name])     GERD (gastroesophageal reflux disease)     History of coronary artery stent placement     HTN (hypertension)     Hyperlipidemia     Myocardial infarction (CMS [name])     OA (osteoarthritis)     OSA (obstructive sleep apnea)     PAD (peripheral artery disease) (CMS [name])     Syncope and collapse     Wears glasses      Past Surgical History:   Procedure Laterality Date    Hand surgery      Hx coronary stent placement      Hx hysterectomy      Hx tonsillectomy      Shoulder surgery Bilateral      Family Hx:   Family History   Problem Relation Age of  Onset    Coronary Artery Disease Sister     Heart Attack Sister      Allergies:   Allergies   Allergen Reactions    Codeine      Pt doesn't remember exact rxn     Medications:   Prior to Admission Medications   Prescriptions Last Dose Informant Patient Reported? Taking?   NOVOLOG U-100 INSULIN ASPART SUBQ   Yes Yes   Sig: Inject under the skin   TRESIBA FLEXTOUCH U-100 100 unit/mL (3 mL) Subcutaneous Insulin Pen   Yes Yes   Sig: INJECT 26 UNITS IN AM AND 30 UNITS IN PM SUBCUTANEOUSLY EVERY DAY AT BEDTIME   aspirin (ECOTRIN) 81 mg Oral Tablet, Delayed Release (E.C.)   No Yes   Sig: TAKE ONE TABLET BY MOUTH ONCE DAILY   atorvastatin (LIPITOR) 40 mg Oral Tablet   No Yes   Sig: TAKE TWO TABLETS BY MOUTH AT BEDTIME   clopidogreL (PLAVIX) 75 mg Oral Tablet   No Yes   Sig: TAKE ONE TABLET  BY MOUTH ONCE DAILY   isosorbide mononitrate (IMDUR) 60 mg Oral Tablet Sustained Release 24 hr   No Yes   Sig: Take 1 Tablet (60 mg total) by mouth Once a day   metoprolol succinate (TOPROL-XL) 25 mg Oral Tablet Sustained Release 24 hr   No Yes   Sig: Take 1 Tablet (25 mg total) by mouth Once a day   nitroGLYCERIN (NITROSTAT) 0.4 mg Sublingual Tablet, Sublingual   No No   Sig: Place 1 Tablet (0.4 mg total) under the tongue Every 5 minutes as needed for Chest pain for 3 doses over 15 minutes   pantoprazole (PROTONIX) 40 mg Oral Tablet, Delayed Release (E.C.)   Yes Yes   Sig: Take 1 Tablet (40 mg total) by mouth Once a day   sertraline (ZOLOFT) 25 mg Oral Tablet   Yes Yes   Sig: Take 1 Tablet (25 mg total) by mouth Once a day   syringe-needle,insulin,0.5 mL (INSULIN SYRINGE N/A)   Yes No      Facility-Administered Medications: None       Social History:    Social History     Tobacco Use    Smoking status: Never    Smokeless tobacco: Never   Vaping Use    Vaping status: Never Used   Substance Use Topics    Alcohol use: Not Currently    Drug use: Never       Above history reviewed with patient.  Allergies, medication list, and old records  also reviewed.     Filed Vitals:    12/30/22 1319 12/30/22 1626   BP: (!) 164/52 118/76   Pulse: 67 66   Resp: 15 16   Temp: 36.6 C (97.9 F) 36.4 C (97.6 F)   SpO2: 98% 100%       Physical Exam:       Nursing note and vitals reviewed.  Vital signs reviewed as above. No acute distress.   Constitutional: Pt is well-developed and well-nourished. Appears stated age.  Head: Normocephalic and atraumatic.   Eyes: Conjunctivae and sclera are normal. Pupils are equal, round, and reactive to light. EOM are intact  Ears:  External ear and canals are normal and without discharge, TMs normal bilaterally.  Nose:  External normal. Nares normal bilaterally.  Mouth/Throat:  Moist mucous membranes.  No redness, swelling or exudate of posterior oropharynx.  Neck: Soft, supple, trachea is midline, with full range of motion. No cervical lymphadenopathy. No appreciable thyromegaly or masses.  Pulmonary/Chest: RRR nl S1, S2 no murmur, rub, or gallop. Distal pulses 2+ and equal. No swelling of BLE.  Normal respiratory effort. No respiratory distress.  Equal breath sounds bilaterally without wheeze, rales or rhonchi.  Abd/GI: soft non-tender non-distended with active bowel sounds  Musculoskeletal: Normal range of motion. No obvious deformities. No swelling of BLE  Neurological: CNs 2-12 grossly intact.  No gross motor or sensory focal deficits noted. Normal gait and balance.  Skin: Warm dry and intact. No rash or lesions  Psychiatric: Patient has a normal mood and affect is congruent with mood.       Labs:      Results for orders placed or performed during the hospital encounter of 12/30/22   COMPREHENSIVE METABOLIC PANEL, NON-FASTING   Result Value Ref Range    SODIUM 141 136 - 145 mmol/L    POTASSIUM 4.5 3.5 - 5.1 mmol/L    CHLORIDE 106 96 - 111 mmol/L    CO2 TOTAL 23 23 - 31 mmol/L  ANION GAP 12 4 - 13 mmol/L    BUN 22 8 - 25 mg/dL    CREATININE 1.61 (H) 0.60 - 1.05 mg/dL    BUN/CREA RATIO 20 6 - 22    ALBUMIN 3.8 3.4 - 4.8 g/dL      CALCIUM 9.6 8.6 - 10.3 mg/dL    GLUCOSE 096 (H) 65 - 125 mg/dL    ALKALINE PHOSPHATASE 137 55 - 145 U/L    ALT (SGPT) 19 <31 U/L    AST (SGOT)  23 11 - 34 U/L    BILIRUBIN TOTAL 0.5 0.3 - 1.3 mg/dL    PROTEIN TOTAL 7.3 6.0 - 8.0 g/dL    ESTIMATED GFR - FEMALE 51 (L) >=60 mL/min/BSA   CBC WITH DIFF   Result Value Ref Range    WBC 7.3 3.7 - 11.0 x10^3/uL    RBC 4.18 3.85 - 5.22 x10^6/uL    HGB 10.3 (L) 11.5 - 16.0 g/dL    HCT 04.5 (L) 40.9 - 46.0 %    MCV 78.5 78.0 - 100.0 fL    MCH 24.6 (L) 26.0 - 32.0 pg    MCHC 31.4 31.0 - 35.5 g/dL    RDW-CV 81.1 91.4 - 78.2 %    PLATELETS 223 150 - 400 x10^3/uL    MPV 10.9 8.7 - 12.5 fL    NEUTROPHIL % 78.9 %    LYMPHOCYTE % 13.6 %    MONOCYTE % 5.1 %    EOSINOPHIL % 1.4 %    BASOPHIL % 0.7 %    NEUTROPHIL # 5.76 1.50 - 7.70 x10^3/uL    LYMPHOCYTE # 0.99 (L) 1.00 - 4.80 x10^3/uL    MONOCYTE # 0.37 0.20 - 1.10 x10^3/uL    EOSINOPHIL # 0.10 <=0.50 x10^3/uL    BASOPHIL # <0.10 <=0.20 x10^3/uL    IMMATURE GRANULOCYTE % 0.3 0.0 - 1.0 %    IMMATURE GRANULOCYTE # <0.10 <0.10 x10^3/uL   BLUE TOP TUBE   Result Value Ref Range    RAINBOW/EXTRA TUBE AUTO RESULT Yes    GRAY TOP TUBE   Result Value Ref Range    RAINBOW/EXTRA TUBE AUTO RESULT Yes        Imaging:    Results for orders placed or performed during the hospital encounter of 12/30/22 (from the past 72 hour(s))   CT SOFT TISSUE NECK W IV CONTRAST     Status: None    Narrative    Keilynn BELL Kawai    PROCEDURE DESCRIPTION: CT SOFT TISSUE NECK W IV CONTRAST    CLINICAL INDICATION: Neck and back of head pain, excoriation of ant/post neck, also feels like something is stuck in throat    COMPARISON: No prior studies were compared.      FINDINGS:   Upper lung zones visualized appear clear. Upper mediastinal soft tissues appear unremarkable. There is some gas noted within the upper third of the esophagus. Distal esophagus not included on the imaging however no obvious foreign body or filling defect noted. Upper airway appears widely  patent.    The thyroid appears unremarkable. Region of the larynx and supraglottic structures including the epiglottis appear unremarkable. Uvula and soft palate are unremarkable. Parapharyngeal soft tissues appear unremarkable.    Parotid glands and submandibular glands appear unremarkable. No lymphadenopathy noted.    Paranasal sinuses visualized and mastoid air cells appear clear. Periorbital soft tissues appear unremarkable. Intracranial structures visualized appear unremarkable.      Impression    1. No obvious abnormality noted.  The CT exam was performed using one or more of the following dose reduction techniques: Automated exposure control, adjustment of the mA and/or kV according to the patient's size, or use of iterative reconstruction technique.      Radiologist location ID: [id number]         Orders Placed This Encounter    CT SOFT TISSUE NECK W IV CONTRAST    CBC/DIFF    COMPREHENSIVE METABOLIC PANEL, NON-FASTING    CBC WITH DIFF    EXTRA TUBES    BLUE TOP TUBE    GOLD TOP TUBE    GRAY TOP TUBE    iopamidol (ISOVUE-300) 61% infusion    valACYclovir (VALTREX) 1 gram Oral Tablet    valACYclovir (VALTREX) tablet    gabapentin (NEURONTIN) capsule    gabapentin (NEURONTIN) 100 mg Oral Capsule           MDM:   Patient is complaining of neck pain for the last 3 days.  Patient has a notable rash on the anterior surface of her neck that goes around her neck and up into her hairline.  Patient states that the rash is very painful.  Patient has been trying to apply lotions to the rash but has stopped because of the pain associated with the lotions being applied.  Patient states that long time ago she had shingles on her shoulder but has not had an outbreak in several years.  Patient states because of the pain it makes it hard for her to swallow.  Patient does not have any notable pharyngitis.  CT scan of the neck did not show any foreign body or anything remarkable on scan.  Patient will be treated with a  and for a shingles outbreak with Valtrex 1000 mg 3 times a day and gabapentin 100 mg 3 times a day for pain control.  Other than above-stated complaints patient denies any chest pain, shortness of breath, fever, chills, nausea or emesis    Appropriate labs and imaging ordered as indicated by HPI and findings on physical exam.  Records review completed, thorough review of previous encounters, notes, labs, imaging, medications, allergies, treatments/therapies, and established plan of care completed today.  PDMP reviewed NARC score 0 , Sedative score 0, Overdose score 0  Labs reviewed by myself results discussed with patient in detail.  Imaging reviewed by myself report discussed with patient in detail.   Illness process discussed with patient.    Recommend close follow-up with primary care provider.  Advised to follow up with primary care provider 3-5 days.    Patient was given the opportunity to ask any questions prior to discharge.  Patient was involved in medical decision making and is agreeable to treatment/discharge plan.  Following the above history, physical exam, and studies, the patient was deemed stable and suitable for discharge.   The patient was advised to return to the ED for any new or worsening symptoms. Discharge, medication and follow up instructions were discussed with the patient, who verbalized understanding.  The patient is comfortable with the plan of care.    Impression:   Clinical Impression   Shingles outbreak (Primary)   Neck pain       Disposition/Plan:  Discharged  Precautions to return to emergency care center discussed.   Follow Up:   Lorita Officer, DO  [facility]  PHYSICIAN CLINIC  [address]  [place] [address]  [phone]    In 3 days  If symptoms worsen  [facility] - Emergency Department  [address]  [place] [address]  [phone]  Go to   If symptoms worsen    Prescriptions:   New Prescriptions     GABAPENTIN (NEURONTIN) 100 MG ORAL CAPSULE    Take 1 Capsule (100 mg total) by mouth Three times a day for 5 days    VALACYCLOVIR (VALTREX) 1 GRAM ORAL TABLET    Take 1 Tablet (1 g total) by mouth Three times a day for 7 days        Future Appointments   Date Time Provider Department Center   04/27/2023  9:45 AM Provider, Cardiology [facility]       [place], PA-C  12/30/2022, 16:33    Chart was dictated using voice recognition software, which may lead to minor grammatical or syntax errors.

## 2022-12-30 NOTE — ED Triage Notes (Signed)
Pt c/o neck pain x 2-3 days.  No known injury.

## 2023-01-12 ENCOUNTER — Other Ambulatory Visit (INDEPENDENT_AMBULATORY_CARE_PROVIDER_SITE_OTHER): Payer: Self-pay | Admitting: Physician Assistant

## 2023-01-12 DIAGNOSIS — R072 Precordial pain: Secondary | ICD-10-CM

## 2023-01-12 DIAGNOSIS — I2510 Atherosclerotic heart disease of native coronary artery without angina pectoris: Secondary | ICD-10-CM

## 2023-02-02 DIAGNOSIS — Z961 Presence of intraocular lens: Secondary | ICD-10-CM | POA: Diagnosis not present

## 2023-02-02 DIAGNOSIS — H40053 Ocular hypertension, bilateral: Secondary | ICD-10-CM | POA: Diagnosis not present

## 2023-02-15 ENCOUNTER — Encounter: Payer: Self-pay | Admitting: Internal Medicine

## 2023-02-15 NOTE — Progress Notes (Unsigned)
[facility]      ADULT   &   ADOLESCENT      INTERNAL MEDICINE  Lucky Cowboy, M.D.          Rance Muir, ANP        Adela Glimpse, FNP  [facility] [address]  [place], [place]. [address] Telephone [phone] Telefax [phone]  Future Appointments  Date Time Provider Department  02/16/2023                        6 mo ov 11:30 AM Lucky Cowboy, MD [facility]  05/24/2023                       3 mo ov - wellness 11:30 AM Adela Glimpse, NP [facility]  08/12/2023 11:45 AM Loa Socks, NP [facility]  09/06/2023                       cpe 11:00 AM Lucky Cowboy, MD [facility]    History of Present Illness:      This very nice 82 y.o.  DWF with HTN, HLD, Pre-Diabetes and Vitamin D Deficiency presents for 6 month follow up .   In 2001, she had surgery for a Brain Aneurysm  and has had  intermittant vertigo since then.  Patient also has hx/o Iron Deficiency anemia due to GI Bleed  & is followed by Dr Pamelia Hoit and receives iron infusions at the [facility] . She had GI w/u for same in 2019 by Dr Russella Dar finding a small polyp, Diverticular Dz & Int Hemorrhoids.   EGD found Esophagitis & Gastritis.            Patient is treated for HTN (2009) . BP has been controlled at home. Today's BP is at goal - 116/70 .   In 2009 she had a False (+) Myoview and a Negative Heart Cath. Patient is followed by Dr Jacinto Halim for pAfib (2019) . Patient has had no complaints of any cardiac type chest pain, palpitations, dyspnea / orthopnea / PND, dizziness, claudication, or dependent edema.        Hyperlipidemia is controlled with diet & meds. Patient denies myalgias or other med SE's. Last Lipids were at goal with sl. Elevated Trig's :  Lab Results  Component Value Date   CHOL 168 11/12/2022   HDL 54 11/12/2022   LDLCALC 88 11/12/2022   TRIG 154 (H) 11/12/2022   CHOLHDL 3.1 11/12/2022     Also, the patient has history of PreDiabetes (A1c 5.9% /2013) and  has had no symptoms of reactive hypoglycemia, diabetic polys, paresthesias or visual blurring.  Last A1c was sl elevated :  Lab Results  Component Value Date   HGBA1C 5.8 (H) 11/12/2022         Further, the patient also has history of Vitamin D Deficiency ("11" /2008) and supplements vitamin D without any suspected side-effects. Last vitamin D was at goal:  Lab Results  Component Value Date   VD25OH 61 08/12/2022         Current Outpatient Medications  Medication Instructions   acetaminophen  500-1,000 mg  Every 6 hours PRN   B-12  1000 mcg CAPS Daily   diazepam 5 MG tablet Take 1/2-1 tablet 2-3 x /day as needed for Severe Vertigo     ELIQUIS 5 MG TABS tablet TAKE 1 TABLET TWICE DAILY    ferrous sulfate 325 mg  Take three times a week    gabapentin 300 MG capsule Take  1 capsule  3 x day for Neuropathy   meclizine 25 MG tablet 1/2-1 pill up to 3 times daily for motion sickness   pantoprazole 40 mg 2 times daily  to Prevent Heartburn & Indigestion   TURMERIC  1 capsule Daily   vitamin C 1,000 mg Daily   Vitamin D  1,000-4,000 Units Daily     No Known Allergies  PMHx:   Past Medical History:  Diagnosis Date   Anemia    Arthritis    Benign neoplasm of sigmoid colon    Deafness    left ear per pt    Dyspnea    pt reports with exertion due to hip and knees    Dysrhythmia    afib    History of bleeding ulcers    2019 received 3-4 units of blood    Hyperlipidemia    Hypertension    Pericardial effusion 02/04/2018   Pre-diabetes    per Dr Jacinto Halim note of 09/28/19 pt denies at preop visit on 10/06/19    Prediabetes    Primary localized osteoarthritis of right hip 09/26/2019   Pulmonary embolism ([name])    hx of 2019    Vertigo    Vitamin D deficiency     Immunization History  Administered Date(s) Administered   DT  01/24/2014   Pneumococcal - 13 05/26/2018   Pneumococcal - 23 10/06/2011   Td 04/23/2003    Past Surgical History:  Procedure Laterality Date    BIOPSY  02/05/2018   Procedure: BIOPSY;  Surgeon: Meryl Dare, MD;  Location: [facility];  Service: Endoscopy;;   CATARACT EXTRACTION W/ INTRAOCULAR LENS  IMPLANT, BILATERAL  2009   CEREBRAL ANEURYSM REPAIR     COLONOSCOPY N/A 02/05/2018   Procedure: COLONOSCOPY;  Surgeon: Meryl Dare, MD;  Location: [facility];  Service: Endoscopy;  Laterality: N/A;   ESOPHAGOGASTRODUODENOSCOPY N/A 02/05/2018   Procedure: ESOPHAGOGASTRODUODENOSCOPY (EGD);  Surgeon: Meryl Dare, MD;  Location: [facility];  Service: Endoscopy;  Laterality: N/A;   FOOT SURGERY  1993   POLYPECTOMY  02/05/2018   Procedure: POLYPECTOMY;  Surgeon: Meryl Dare, MD;  Location: [facility];  Service: Endoscopy;;   TOTAL HIP ARTHROPLASTY Right 10/17/2019   Procedure: TOTAL HIP ARTHROPLASTY ANTERIOR APPROACH;  Surgeon: Sheral Apley, MD;  Location: [facility];  Service: Orthopedics;  Laterality: Right;   TUBAL LIGATION      FHx:    Reviewed / unchanged  SHx:    Reviewed / unchanged   Systems Review:  Constitutional: Denies fever, chills, wt changes, headaches, insomnia, fatigue, night sweats, change in appetite. Eyes: Denies redness, blurred vision, diplopia, discharge, itchy, watery eyes.  ENT: Denies discharge, congestion, post nasal drip, epistaxis, sore throat, earache, hearing loss, dental pain, tinnitus, vertigo, sinus pain, snoring.  CV: Denies chest pain, palpitations, irregular heartbeat, syncope, dyspnea, diaphoresis, orthopnea, PND, claudication or edema. Respiratory: denies cough, dyspnea, DOE, pleurisy, hoarseness, laryngitis, wheezing.  Gastrointestinal: Denies dysphagia, odynophagia, heartburn, reflux, water brash, abdominal pain or cramps, nausea, vomiting, bloating, diarrhea, constipation, hematemesis, melena, hematochezia  or hemorrhoids. Genitourinary: Denies dysuria, frequency, urgency, nocturia, hesitancy, discharge, hematuria or flank pain. Musculoskeletal: Denies arthralgias,  myalgias, stiffness, jt. swelling, pain, limping or strain/sprain.  Skin: Denies pruritus, rash, hives, warts, acne, eczema or change in skin lesion(s). Neuro: No weakness, tremor, incoordination, spasms, paresthesia or pain. Psychiatric: Denies confusion, memory loss or sensory loss. Endo: Denies change in  weight, skin or hair change.  Heme/Lymph: No excessive bleeding, bruising or enlarged lymph nodes.  Physical Exam  BP 116/70   Pulse 63   Temp 97.9 F (36.6 C)   Resp 16   Ht 5\' 3"  (1.6 m)   Wt 180 lb 6.4 oz (81.8 kg)   SpO2 96%   BMI 31.96 kg/m   Appears  well nourished, well groomed  and in no distress.  Eyes: PERRLA, EOMs, conjunctiva no swelling or erythema. Sinuses: No frontal/maxillary tenderness ENT/Mouth: EAC's clear, TM's nl w/o erythema, bulging. Nares clear w/o erythema, swelling, exudates. Oropharynx clear without erythema or exudates. Oral hygiene is good. Tongue normal, non obstructing. Hearing intact.  Neck: Supple. Thyroid not palpable. Car 2+/2+ without bruits, nodes or JVD. Chest: Respirations nl with BS clear & equal w/o rales, rhonchi, wheezing or stridor.  Cor: Heart sounds normal w/ regular rate and rhythm without sig. murmurs, gallops, clicks or rubs. Peripheral pulses normal and equal  without edema.  Abdomen: Soft & bowel sounds normal. Non-tender w/o guarding, rebound, hernias, masses or organomegaly.  Lymphatics: Unremarkable.  Musculoskeletal: Full ROM all peripheral extremities, joint stability, 5/5 strength and normal gait.  Skin: Warm, dry without exposed rashes, lesions or ecchymosis apparent.  Neuro: Cranial nerves intact, reflexes equal bilaterally. Sensory-motor testing grossly intact. Tendon reflexes grossly intact.  Pysch: Alert & oriented x 3.  Insight and judgement nl & appropriate. No ideations.   Assessment and Plan:  1. Essential hypertension  - CBC with Differential/Platelet - COMPLETE METABOLIC PANEL WITH GFR - Magnesium -  TSH  2. Hyperlipidemia, mixed  - Lipid panel - TSH  3. Abnormal glucose  - Hemoglobin A1c - Insulin, random  4. Vitamin D deficiency  - VITAMIN D 25 Hydroxy   5. Chronic atrial fibrillation ([name])  - TSH  6. Gastroesophageal reflux disease without esophagitis  - CBC with Differential/Platelet  7. Thrombophilia ([name])  - CBC with Differential/Platelet  8. History of iron deficiency anemia due to Blood Loss  - CBC with Differential/Platelet - Iron, Total/Total Iron Binding Cap - Ferritin  9. Medication management  - CBC with Differential/Platelet - COMPLETE METABOLIC PANEL WITH GFR - Magnesium - Lipid panel - TSH - Hemoglobin A1c - Insulin, random - VITAMIN D 25 Hydroxy - Iron, Total/Total Iron Binding Cap - Ferritin         Discussed  regular exercise, BP monitoring, weight control to achieve/maintain BMI less than 25 and discussed med and SE's. Recommended labs to assess and monitor clinical status with further disposition pending results of labs.  I discussed the assessment and treatment plan with the patient. The patient was provided an opportunity to ask questions and all were answered. The patient agreed with the plan and demonstrated an understanding of the instructions.  I provided over 30 minutes of exam, counseling, chart review and  complex critical decision making.   Marinus Maw, MD

## 2023-02-15 NOTE — Patient Instructions (Signed)

## 2023-02-16 ENCOUNTER — Encounter: Payer: Self-pay | Admitting: Internal Medicine

## 2023-02-16 ENCOUNTER — Ambulatory Visit (INDEPENDENT_AMBULATORY_CARE_PROVIDER_SITE_OTHER): Payer: PPO | Admitting: Internal Medicine

## 2023-02-16 VITALS — BP 116/70 | HR 63 | Temp 97.9°F | Resp 16 | Ht 63.0 in | Wt 180.4 lb

## 2023-02-16 DIAGNOSIS — E559 Vitamin D deficiency, unspecified: Secondary | ICD-10-CM | POA: Diagnosis not present

## 2023-02-16 DIAGNOSIS — K219 Gastro-esophageal reflux disease without esophagitis: Secondary | ICD-10-CM | POA: Diagnosis not present

## 2023-02-16 DIAGNOSIS — Z79899 Other long term (current) drug therapy: Secondary | ICD-10-CM

## 2023-02-16 DIAGNOSIS — D6859 Other primary thrombophilia: Secondary | ICD-10-CM

## 2023-02-16 DIAGNOSIS — I10 Essential (primary) hypertension: Secondary | ICD-10-CM

## 2023-02-16 DIAGNOSIS — R7309 Other abnormal glucose: Secondary | ICD-10-CM | POA: Diagnosis not present

## 2023-02-16 DIAGNOSIS — E782 Mixed hyperlipidemia: Secondary | ICD-10-CM

## 2023-02-16 DIAGNOSIS — I482 Chronic atrial fibrillation, unspecified: Secondary | ICD-10-CM | POA: Diagnosis not present

## 2023-02-16 DIAGNOSIS — D500 Iron deficiency anemia secondary to blood loss (chronic): Secondary | ICD-10-CM

## 2023-02-17 LAB — CBC WITH DIFFERENTIAL/PLATELET
Absolute Lymphocytes: 1015 {cells}/uL (ref 850–3900)
Absolute Monocytes: 605 {cells}/uL (ref 200–950)
Basophils Absolute: 49 {cells}/uL (ref 0–200)
Basophils Relative: 0.9 %
Eosinophils Absolute: 70 {cells}/uL (ref 15–500)
Eosinophils Relative: 1.3 %
HCT: 38.3 % (ref 35.0–45.0)
Hemoglobin: 11.7 g/dL (ref 11.7–15.5)
MCH: 26.7 pg — ABNORMAL LOW (ref 27.0–33.0)
MCHC: 30.5 g/dL — ABNORMAL LOW (ref 32.0–36.0)
MCV: 87.2 fL (ref 80.0–100.0)
MPV: 13.2 fL — ABNORMAL HIGH (ref 7.5–12.5)
Monocytes Relative: 11.2 %
Neutro Abs: 3661 {cells}/uL (ref 1500–7800)
Neutrophils Relative %: 67.8 %
Platelets: 242 10*3/uL (ref 140–400)
RBC: 4.39 10*6/uL (ref 3.80–5.10)
RDW: 14.6 % (ref 11.0–15.0)
Total Lymphocyte: 18.8 %
WBC: 5.4 10*3/uL (ref 3.8–10.8)

## 2023-02-17 LAB — HEMOGLOBIN A1C
Hgb A1c MFr Bld: 5.7 %{Hb} — ABNORMAL HIGH (ref <5.7)
Mean Plasma Glucose: 117 mg/dL
eAG (mmol/L): 6.5 mmol/L

## 2023-02-17 LAB — TSH: TSH: 2.64 m[IU]/L (ref 0.40–4.50)

## 2023-02-17 LAB — COMPLETE METABOLIC PANEL WITH GFR
AG Ratio: 2.4 (calc) (ref 1.0–2.5)
ALT: 9 U/L (ref 6–29)
AST: 12 U/L (ref 10–35)
Albumin: 4.4 g/dL (ref 3.6–5.1)
Alkaline phosphatase (APISO): 88 U/L (ref 37–153)
BUN/Creatinine Ratio: 19 (calc) (ref 6–22)
BUN: 21 mg/dL (ref 7–25)
CO2: 30 mmol/L (ref 20–32)
Calcium: 9.2 mg/dL (ref 8.6–10.4)
Chloride: 106 mmol/L (ref 98–110)
Creat: 1.09 mg/dL — ABNORMAL HIGH (ref 0.60–0.95)
Globulin: 1.8 g/dL — ABNORMAL LOW (ref 1.9–3.7)
Glucose, Bld: 76 mg/dL (ref 65–99)
Potassium: 4.7 mmol/L (ref 3.5–5.3)
Sodium: 143 mmol/L (ref 135–146)
Total Bilirubin: 0.4 mg/dL (ref 0.2–1.2)
Total Protein: 6.2 g/dL (ref 6.1–8.1)
eGFR: 51 mL/min/{1.73_m2} — ABNORMAL LOW (ref >=60)

## 2023-02-17 LAB — IRON,TIBC AND FERRITIN PANEL
%SAT: 78 % — ABNORMAL HIGH (ref 16–45)
Ferritin: 51 ng/mL (ref 16–288)
Iron: 292 ug/dL — ABNORMAL HIGH (ref 45–160)
TIBC: 375 ug/dL (ref 250–450)

## 2023-02-17 LAB — LIPID PANEL
Cholesterol: 166 mg/dL (ref <200)
HDL: 49 mg/dL — ABNORMAL LOW (ref >=50)
LDL Cholesterol (Calc): 95 mg/dL
Non-HDL Cholesterol (Calc): 117 mg/dL (ref <130)
Total CHOL/HDL Ratio: 3.4 (calc) (ref <5.0)
Triglycerides: 119 mg/dL (ref <150)

## 2023-02-17 LAB — MAGNESIUM: Magnesium: 2.3 mg/dL (ref 1.5–2.5)

## 2023-02-17 LAB — INSULIN, RANDOM: Insulin: 12.1 u[IU]/mL

## 2023-02-17 LAB — VITAMIN D 25 HYDROXY (VIT D DEFICIENCY, FRACTURES): Vit D, 25-Hydroxy: 76 ng/mL (ref 30–100)

## 2023-02-17 NOTE — Progress Notes (Signed)
[][][][][][][][][][][][][][][][][][][][][][][][][][][][][][][][][][][][][][][][][]][][][][][][][][][][][][][][][][][][][][][][][[][][][][] [][][][][][][][][][][][][][][][][][][][][][][][][][][][][][][][][][][][][][][][][]][][][][][][][][][][][][][][][][][][][][][][][[][][][][] -  Test results slightly outside the reference range are not unusual. If there is anything important, I will review this with you,  otherwise it is considered normal test values.  If you have further questions,  please do not hesitate to contact me at the office or via My Chart.  [] [] [] [] [] [] [] [] [] [] [] [] [] [] [] [] [] [] [] [] [] [] [] [] [] [] [] [] [] [] [] [] [] [] [] [] [] [] [] [] [] ][] [] [] [] [] [] [] [] [] [] [] [] [] [] [] [] [] [] [] [] [] [] [[] [] [] [] []  [] [] [] [] [] [] [] [] [] [] [] [] [] [] [] [] [] [] [] [] [] [] [] [] [] [] [] [] [] [] [] [] [] [] [] [] [] [] [] [] [] ][] [] [] [] [] [] [] [] [] [] [] [] [] [] [] [] [] [] [] [] [] [] [[] [] [] [] []   -  Kidney Functions stable in Stage 3a.   [] [] [] [] [] [] [] [] [] [] [] [] [] [] [] [] [] [] [] [] [] [] [] [] [] [] [] [] [] [] [] [] [] [] [] [] [] [] [] [] [] ][] [] [] [] [] [] [] [] [] [] [] [] [] [] [] [] [] [] [] [] [] [] [[] [] [] [] []   -   Chol = 166 and LDL 95  - Both - Great   [] [] [] [] [] [] [] [] [] [] [] [] [] [] [] [] [] [] [] [] [] [] [] [] [] [] [] [] [] [] [] [] [] [] [] [] [] [] [] [] [] ][] [] [] [] [] [] [] [] [] [] [] [] [] [] [] [] [] [] [] [] [] [] [[] [] [] [] []   -   A1c = 5.7% - still borderline elevated sugars, So     - Avoid Sweets, Candy & White Stuff   - White Rice, White Potatoes, White Flour  - Breads &  Pasta  [] [] [] [] [] [] [] [] [] [] [] [] [] [] [] [] [] [] [] [] [] [] [] [] [] [] [] [] [] [] [] [] [] [] [] [] [] [] [] [] [] ][] [] [] [] [] [] [] [] [] [] [] [] [] [] [] [] [] [] [] [] [] [] [[] [] [] [] []   -   Iron levels are OK now - So you can cut back from 3 x / week to 2 x  / week   [] [] [] [] [] [] [] [] [] [] [] [] [] [] [] [] [] [] [] [] [] [] [] [] [] [] [] [] [] [] [] [] [] [] [] [] [] [] [] [] [] ][] [] [] [] [] [] [] [] [] [] [] [] [] [] [] [] [] [] [] [] [] [] [[] [] [] [] []   -   All Else - CBC - Kidneys - Electrolytes - Liver - Magnesium & Thyroid    - all  Normal /  OK  [] [] [] [] [] [] [] [] [] [] [] [] [] [] [] [] [] [] [] [] [] [] [] [] [] [] [] [] [] [] [] [] [] [] [] [] [] [] [] [] [] ][] [] [] [] [] [] [] [] [] [] [] [] [] [] [] [] [] [] [] [] [] [] [[] [] [] [] []

## 2023-02-22 ENCOUNTER — Other Ambulatory Visit: Payer: Self-pay | Admitting: Nurse Practitioner

## 2023-02-22 DIAGNOSIS — R42 Dizziness and giddiness: Secondary | ICD-10-CM

## 2023-04-19 ENCOUNTER — Ambulatory Visit: Payer: PPO | Admitting: Nurse Practitioner

## 2023-04-27 ENCOUNTER — Encounter (INDEPENDENT_AMBULATORY_CARE_PROVIDER_SITE_OTHER): Payer: Self-pay

## 2023-05-10 ENCOUNTER — Encounter: Payer: Self-pay | Admitting: Hematology and Oncology

## 2023-05-17 ENCOUNTER — Encounter (HOSPITAL_BASED_OUTPATIENT_CLINIC_OR_DEPARTMENT_OTHER): Payer: Self-pay | Admitting: Student

## 2023-05-17 ENCOUNTER — Ambulatory Visit (HOSPITAL_BASED_OUTPATIENT_CLINIC_OR_DEPARTMENT_OTHER): Payer: PPO | Admitting: Student

## 2023-05-17 VITALS — BP 148/86 | HR 72 | Temp 98.0°F | Ht 63.78 in | Wt 178.0 lb

## 2023-05-17 DIAGNOSIS — Z79899 Other long term (current) drug therapy: Secondary | ICD-10-CM

## 2023-05-17 DIAGNOSIS — Z86711 Personal history of pulmonary embolism: Secondary | ICD-10-CM | POA: Diagnosis not present

## 2023-05-17 DIAGNOSIS — D509 Iron deficiency anemia, unspecified: Secondary | ICD-10-CM

## 2023-05-17 DIAGNOSIS — D6859 Other primary thrombophilia: Secondary | ICD-10-CM

## 2023-05-17 DIAGNOSIS — E782 Mixed hyperlipidemia: Secondary | ICD-10-CM

## 2023-05-17 DIAGNOSIS — E559 Vitamin D deficiency, unspecified: Secondary | ICD-10-CM

## 2023-05-17 DIAGNOSIS — Z7689 Persons encountering health services in other specified circumstances: Secondary | ICD-10-CM | POA: Diagnosis not present

## 2023-05-17 DIAGNOSIS — I10 Essential (primary) hypertension: Secondary | ICD-10-CM | POA: Diagnosis not present

## 2023-05-17 DIAGNOSIS — S069XAA Unspecified intracranial injury with loss of consciousness status unknown, initial encounter: Secondary | ICD-10-CM | POA: Diagnosis not present

## 2023-05-17 DIAGNOSIS — R7303 Prediabetes: Secondary | ICD-10-CM | POA: Diagnosis not present

## 2023-05-17 DIAGNOSIS — I480 Paroxysmal atrial fibrillation: Secondary | ICD-10-CM | POA: Diagnosis not present

## 2023-05-17 DIAGNOSIS — R42 Dizziness and giddiness: Secondary | ICD-10-CM | POA: Diagnosis not present

## 2023-05-17 DIAGNOSIS — M1990 Unspecified osteoarthritis, unspecified site: Secondary | ICD-10-CM | POA: Insufficient documentation

## 2023-05-17 MED ORDER — ONDANSETRON 4 MG PO TBDP: 4.0000 mg | ORAL_TABLET | Freq: Three times a day (TID) | ORAL | 2 refills | Status: DC | PRN

## 2023-05-17 MED ORDER — APIXABAN 5 MG PO TABS: 5.0000 mg | ORAL_TABLET | Freq: Two times a day (BID) | ORAL | 3 refills | Status: DC

## 2023-05-17 MED ORDER — MECLIZINE HCL 25 MG PO TABS: ORAL_TABLET | ORAL | 5 refills | Status: AC

## 2023-05-17 NOTE — Patient Instructions (Signed)
 It was nice to see you today!  As we discussed in clinic I will let you know about your lab results. Please let me know if your blood pressure is regularly remaining above 140/85.  If you have any problems before your next visit feel free to message me via MyChart (minor issues or questions) or call the office, otherwise you may reach out to schedule an office visit.  Thank you! Gerilyn Pilgrim Hiawatha Dressel, PA-C

## 2023-05-17 NOTE — Progress Notes (Signed)
 New Patient Office Visit  Subjective    Patient ID: Lori [name], female    DOB: [birth year]  Age: 83 y.o. MRN: [id number]  CC:  Chief Complaint  Patient presents with   Establish Care    Pt. Here to establish care.    Dizziness    Pt. Stats of feeling dizzy every morning that's been going on since 20 years.     HPI Lori [name] presents to establish care. Prior PCP was Lori. Oneta [name].    Hypertension- Pt denies chest pain, SOB, or heart palpitations.  Taking meds as directed w/o problems.  Denies medication side effects.    Hyperlipidemia - tolerating stating well with no myalgias or significant side effects.  Lab Results  Component Value Date   CHOL 166 02/16/2023   HDL 49 (L) 02/16/2023   LDLCALC 95 02/16/2023   TRIG 119 02/16/2023   CHOLHDL 3.4 02/16/2023   Vitamin D deficiency- history of, last vitamin d level was at goal. 08/12/22 was 61. Continue otc dose of oral vitamin D.  Chronic A fib- History of PE.  Follows with cardiology, but has not seen them in over a year. States last echo was around 2 years ago.  IDA- History of IDA treated with oral iron therapy. Per Lori. Oneta [name] Note---Patient also has hx/o Iron Deficiency anemia due to GI Bleed  & is followed by Lori Lori [name] and receives iron infusions at the [facility].  She had GI w/u for same in 2019 by Lori [name] finding a small polyp, Diverticular Dz & Int Hemorrhoids.   EGD found Esophagitis & Gastritis.Last iron panel indicated sufficient supplementation. Per Lori. Oneta [name], she has been on 2x per week of oral iron.  Stage 3a CKD- not currently on any renally protective medications. This is likely secondary to ae related renal changes.  Dizziness- In 2001, she had surgery for a Brain Aneurysm  and has had  intermittant vertigo since then. She notes that she is dizzy every morning.  She states that she does not feel that the meclizine or diazepam is helping with the severe vertigo.  Patient notes that she is occasionally  nauseous due to this, will provide a Zofran for as needed use.  Screenings:  Colon Cancer: not indicated Lung Cancer: not indicated Breast Cancer: not indicated Diabetes: indicated  HLD: indicated   Outpatient Encounter Medications as of 05/17/2023  Medication Sig   acetaminophen (TYLENOL) 500 MG tablet Take 500-1,000 mg by mouth every 6 (six) hours as needed for mild pain or moderate pain.   Ascorbic Acid (VITAMIN C) 1000 MG tablet Take 1,000 mg by mouth daily.   Cholecalciferol (VITAMIN D3) 1000 units CAPS Take 1,000-4,000 Units by mouth daily.   Cyanocobalamin (B-12) 1000 MCG CAPS Take by mouth daily.   diazepam (VALIUM) 5 MG tablet Take  1/2 to 1 tablet 2 to 3 x / day as needed for Severe Vertigo  / Dizziness                                          /                                  TAKE  BY                               MOUTH   ferrous sulfate 325 (65 FE) MG tablet Take 1 tablet (325 mg total) by mouth See admin instructions. Take 325 mg by mouth three times a week with food   gabapentin (NEURONTIN) 300 MG capsule Take  1 capsule  3 x day for Neuropathy Pain                                                              /                                                      TAKE                                                       BY                                                  MOUTH   metoprolol tartrate (LOPRESSOR) 25 MG tablet Take 25 mg by mouth daily.   ondansetron (ZOFRAN-ODT) 4 MG disintegrating tablet Take 1 tablet (4 mg total) by mouth every 8 (eight) hours as needed for nausea or vomiting.   pantoprazole (PROTONIX) 40 MG tablet Take 1 tablet (40 mg total) by mouth 2 (two) times daily. Take  1 tablet  Daily  to Prevent Heartburn & Indigestion   TURMERIC PO Take 1 capsule by mouth daily.   [DISCONTINUED] ELIQUIS 5 MG TABS tablet TAKE 1 TABLET BY MOUTH TWICE DAILY TO PREVENT BLOOD CLOTS   [DISCONTINUED] meclizine (ANTIVERT) 25 MG tablet  TAKE ONE-HALF TO ONE TABLET BY MOUTH UP TO THREE TIMES DAILY FOR MOTION SICKNESS/DIZZINESS   apixaban (ELIQUIS) 5 MG TABS tablet Take 1 tablet (5 mg total) by mouth 2 (two) times daily.   meclizine (ANTIVERT) 25 MG tablet TAKE ONE-HALF TO ONE TABLET BY MOUTH UP TO THREE TIMES DAILY FOR MOTION SICKNESS/DIZZINESSTAKE ONE-HALF TO ONE TABLET BY MOUTH UP TO THREE TIMES DAILY FOR MOTION SICKNESS/DIZZINESS   No facility-administered encounter medications on file as of 05/17/2023.    Past Medical History:  Diagnosis Date   Anemia    Arthritis    Benign neoplasm of sigmoid colon    Deafness    left ear per pt    Dyspnea    pt reports with exertion due to hip and knees    Dysrhythmia    afib    History of bleeding ulcers    2019 received 3-4 units of blood    Hyperlipidemia    Hypertension    Pericardial effusion 02/04/2018   Pre-diabetes    per Lori Lori [name] note of 09/28/19 pt denies at preop visit on  10/06/19    Prediabetes    Primary localized osteoarthritis of right hip 09/26/2019   Pulmonary embolism ([name])    hx of 2019    Vertigo    Vitamin D deficiency     Past Surgical History:  Procedure Laterality Date   BIOPSY  02/05/2018   Procedure: BIOPSY;  Surgeon: Meryl Dare, MD;  Location: [facility];  Service: Endoscopy;;   CATARACT EXTRACTION W/ INTRAOCULAR LENS  IMPLANT, BILATERAL  2009   CEREBRAL ANEURYSM REPAIR     COLONOSCOPY N/A 02/05/2018   Procedure: COLONOSCOPY;  Surgeon: Meryl Dare, MD;  Location: [facility];  Service: Endoscopy;  Laterality: N/A;   ESOPHAGOGASTRODUODENOSCOPY N/A 02/05/2018   Procedure: ESOPHAGOGASTRODUODENOSCOPY (EGD);  Surgeon: Meryl Dare, MD;  Location: [facility];  Service: Endoscopy;  Laterality: N/A;   FOOT SURGERY  1993   POLYPECTOMY  02/05/2018   Procedure: POLYPECTOMY;  Surgeon: Meryl Dare, MD;  Location: [facility];  Service: Endoscopy;;   TOTAL HIP ARTHROPLASTY Right 10/17/2019   Procedure: TOTAL HIP ARTHROPLASTY  ANTERIOR APPROACH;  Surgeon: Sheral Apley, MD;  Location: [facility];  Service: Orthopedics;  Laterality: Right;   TUBAL LIGATION      Family History  Problem Relation Age of Onset   Hypertension Mother    Diabetes Mother    Cancer Mother        Pancreatic   Cancer Father        Skin   Colon cancer Neg Hx    Stomach cancer Neg Hx    Esophageal cancer Neg Hx     Social History   Socioeconomic History   Marital status: Divorced    Spouse name: Not on file   Number of children: 1   Years of education: 12   Highest education level: Not on file  Occupational History   Occupation: retired  Tobacco Use   Smoking status: Former    Current packs/day: 0.00    Types: Cigarettes    Quit date: 08/07/1990    Years since quitting: 32.7   Smokeless tobacco: Never  Vaping Use   Vaping status: Never Used  Substance and Sexual Activity   Alcohol use: No    Alcohol/week: 0.0 standard drinks of alcohol   Drug use: No   Sexual activity: Not on file  Other Topics Concern   Not on file  Social History Narrative   Not on file   Social Drivers of Health   Financial Resource Strain: Not on file  Food Insecurity: No Food Insecurity (04/17/2022)   Hunger Vital Sign    Worried About Running Out of Food in the Last Year: Never true    Ran Out of Food in the Last Year: Never true  Transportation Needs: No Transportation Needs (04/17/2022)   PRAPARE - Administrator, Civil Service (Medical): No    Lack of Transportation (Non-Medical): No  Physical Activity: Not on file  Stress: Not on file  Social Connections: Not on file  Intimate Partner Violence: Not At Risk (04/17/2022)   Humiliation, Afraid, Rape, and Kick questionnaire    Fear of Current or Ex-Partner: No    Emotionally Abused: No    Physically Abused: No    Sexually Abused: No    ROS  Per HPI      Objective    BP (!) 148/86 (BP Location: Right Arm, Patient Position: Sitting, Cuff Size: Large)   Pulse 72    Temp 98 F (36.7 C) (Oral)  Ht 5' 3.78" (1.62 m)   Wt 178 lb (80.7 kg)   SpO2 96%   BMI 30.76 kg/m   Physical Exam Constitutional:      General: She is not in acute distress.    Appearance: Normal appearance. She is not ill-appearing.  HENT:     Head: Normocephalic and atraumatic.     Right Ear: External ear normal.     Left Ear: External ear normal.     Nose: Nose normal.     Mouth/Throat:     Mouth: Mucous membranes are moist.     Pharynx: Oropharynx is clear.  Eyes:     General: No scleral icterus.    Extraocular Movements: Extraocular movements intact.     Conjunctiva/sclera: Conjunctivae normal.     Pupils: Pupils are equal, round, and reactive to light.  Neck:     Vascular: No carotid bruit.  Cardiovascular:     Rate and Rhythm: Normal rate and regular rhythm.     Pulses: Normal pulses.     Heart sounds: Normal heart sounds. No murmur heard.    No friction rub.  Pulmonary:     Effort: Pulmonary effort is normal. No respiratory distress.     Breath sounds: Normal breath sounds. No wheezing, rhonchi or rales.  Musculoskeletal:        General: Normal range of motion.     Cervical back: Neck supple.     Right lower leg: No edema.     Left lower leg: No edema.  Lymphadenopathy:     Cervical: No cervical adenopathy.  Skin:    General: Skin is warm and dry.  Neurological:     General: No focal deficit present.     Mental Status: She is alert.  Psychiatric:        Mood and Affect: Mood normal.        Behavior: Behavior normal.         Assessment & Plan:   Encounter to establish care  Thrombophilia [facility]) Assessment & Plan: Assess iron panel, CBC, CMP. Continue Eliquis.  Orders: -     Iron, TIBC and Ferritin Panel -     CBC with Differential/Platelet -     Comprehensive metabolic panel  Essential hypertension Assessment & Plan: Stable-continue current regimen. If BP remains consistently above 140 may adjust BP regimen, I favor a higher BP goals  due to age and history of dizziness.   Paroxysmal atrial fibrillation ([name]). CHA2DS2-VASc Score is 4.  Yearly risk of stroke: 4% (A, HTN, F).    Assessment & Plan: Continue Eliquis 5 mg daily twice daily. Ordered TSH to assess.  Orders: -     Apixaban; Take 1 tablet (5 mg total) by mouth 2 (two) times daily.  Dispense: 180 tablet; Refill: 3 -     TSH  Vertigo due to brain injury [facility]) Assessment & Plan: Continue current regimen-refill meclizine. Order Zofran for occasional nausea associated with vertigo.  Orders: -     Meclizine HCl; TAKE ONE-HALF TO ONE TABLET BY MOUTH UP TO THREE TIMES DAILY FOR MOTION SICKNESS/DIZZINESSTAKE ONE-HALF TO ONE TABLET BY MOUTH UP TO THREE TIMES DAILY FOR MOTION SICKNESS/DIZZINESS  Dispense: 30 tablet; Refill: 5 -     Ondansetron; Take 1 tablet (4 mg total) by mouth every 8 (eight) hours as needed for nausea or vomiting.  Dispense: 20 tablet; Refill: 2  Vitamin D deficiency Assessment & Plan: History of the same-assess current vitamin D levels.  Orders: -  VITAMIN D 25 Hydroxy (Vit-D Deficiency, Fractures)  Iron deficiency anemia, unspecified iron deficiency anemia type Assessment & Plan: Most recently doing well on oral iron twice weekly. History of IDA related to chronic blood loss.  Reassess with iron panel. History of IV iron infusions.  Orders: -     Iron, TIBC and Ferritin Panel  History of pulmonary embolism (01/2018)  Prediabetes -     CBC with Differential/Platelet -     Comprehensive metabolic panel -     Hemoglobin A1c  Hyperlipidemia, mixed Assessment & Plan: Stable-continue current regimen. Last labs on February 16, 2023 indicated great control of cholesterol.  Orders: -     Lipid panel  Medication management -     Iron, TIBC and Ferritin Panel -     CBC with Differential/Platelet -     Comprehensive metabolic panel -     Lipid panel -     Hemoglobin A1c -     VITAMIN D 25 Hydroxy (Vit-D Deficiency, Fractures) -      TSH   I have spent greater than 60 minutes charting, educating, diagnosing and managing this patient for this visit.   Return in about 6 months (around 11/17/2023) for Chronic Followup.   Teryl Lucy Antwoine Zorn, PA-C

## 2023-05-17 NOTE — Assessment & Plan Note (Signed)
 Assess iron panel, CBC, CMP. Continue Eliquis.

## 2023-05-17 NOTE — Assessment & Plan Note (Signed)
 Most recently doing well on oral iron twice weekly. History of IDA related to chronic blood loss.  Reassess with iron panel. History of IV iron infusions.

## 2023-05-17 NOTE — Assessment & Plan Note (Signed)
 Stable-continue current regimen. If BP remains consistently above 140 may adjust BP regimen, I favor a higher BP goals due to age and history of dizziness.

## 2023-05-17 NOTE — Assessment & Plan Note (Signed)
 Continue current regimen-refill meclizine. Order Zofran for occasional nausea associated with vertigo.

## 2023-05-17 NOTE — Assessment & Plan Note (Signed)
 Continue Eliquis 5 mg daily twice daily. Ordered TSH to assess.

## 2023-05-17 NOTE — Assessment & Plan Note (Signed)
 Stable-continue current regimen. Last labs on February 16, 2023 indicated great control of cholesterol.

## 2023-05-17 NOTE — Assessment & Plan Note (Signed)
 History of the same-assess current vitamin D levels.

## 2023-05-18 LAB — COMPREHENSIVE METABOLIC PANEL
ALT: 8 IU/L (ref 0–32)
AST: 16 IU/L (ref 0–40)
Albumin: 4.2 g/dL (ref 3.8–4.8)
Alkaline Phosphatase: 90 IU/L (ref 44–121)
BUN/Creatinine Ratio: 23 (ref 12–28)
BUN: 22 mg/dL (ref 8–27)
Bilirubin Total: 0.2 mg/dL (ref 0.0–1.2)
CO2: 21 mmol/L (ref 20–29)
Calcium: 8.8 mg/dL (ref 8.7–10.3)
Chloride: 109 mmol/L — ABNORMAL HIGH (ref 96–106)
Creatinine, Ser: 0.97 mg/dL (ref 0.57–1.00)
Globulin, Total: 1.8 g/dL (ref 1.5–4.5)
Glucose: 89 mg/dL (ref 70–99)
Potassium: 5.3 mmol/L — ABNORMAL HIGH (ref 3.5–5.2)
Sodium: 144 mmol/L (ref 134–144)
Total Protein: 6 g/dL (ref 6.0–8.5)
eGFR: 59 mL/min/{1.73_m2} — ABNORMAL LOW (ref >59)

## 2023-05-18 LAB — CBC WITH DIFFERENTIAL/PLATELET
Basophils Absolute: 0 10*3/uL (ref 0.0–0.2)
Basos: 1 %
EOS (ABSOLUTE): 0 10*3/uL (ref 0.0–0.4)
Eos: 1 %
Hematocrit: 30 % — ABNORMAL LOW (ref 34.0–46.6)
Hemoglobin: 8.9 g/dL — ABNORMAL LOW (ref 11.1–15.9)
Immature Grans (Abs): 0 10*3/uL (ref 0.0–0.1)
Immature Granulocytes: 0 %
Lymphocytes Absolute: 1 10*3/uL (ref 0.7–3.1)
Lymphs: 18 %
MCH: 25.3 pg — ABNORMAL LOW (ref 26.6–33.0)
MCHC: 29.7 g/dL — ABNORMAL LOW (ref 31.5–35.7)
MCV: 85 fL (ref 79–97)
Monocytes Absolute: 0.6 10*3/uL (ref 0.1–0.9)
Monocytes: 11 %
Neutrophils Absolute: 3.9 10*3/uL (ref 1.4–7.0)
Neutrophils: 69 %
Platelets: 284 10*3/uL (ref 150–450)
RBC: 3.52 x10E6/uL — ABNORMAL LOW (ref 3.77–5.28)
RDW: 18.5 % — ABNORMAL HIGH (ref 11.7–15.4)
WBC: 5.7 10*3/uL (ref 3.4–10.8)

## 2023-05-18 LAB — HEMOGLOBIN A1C
Est. average glucose Bld gHb Est-mCnc: 111 mg/dL
Hgb A1c MFr Bld: 5.5 % (ref 4.8–5.6)

## 2023-05-18 LAB — LIPID PANEL
Chol/HDL Ratio: 2.6 ratio (ref 0.0–4.4)
Cholesterol, Total: 131 mg/dL (ref 100–199)
HDL: 50 mg/dL (ref >39)
LDL Chol Calc (NIH): 65 mg/dL (ref 0–99)
Triglycerides: 83 mg/dL (ref 0–149)
VLDL Cholesterol Cal: 16 mg/dL (ref 5–40)

## 2023-05-18 LAB — IRON,TIBC AND FERRITIN PANEL
Ferritin: 51 ng/mL (ref 15–150)
Iron Saturation: 5 % — CL (ref 15–55)
Iron: 17 ug/dL — ABNORMAL LOW (ref 27–139)
Total Iron Binding Capacity: 371 ug/dL (ref 250–450)
UIBC: 354 ug/dL (ref 118–369)

## 2023-05-18 LAB — TSH: TSH: 3.69 u[IU]/mL (ref 0.450–4.500)

## 2023-05-18 LAB — VITAMIN D 25 HYDROXY (VIT D DEFICIENCY, FRACTURES): Vit D, 25-Hydroxy: 59.5 ng/mL (ref 30.0–100.0)

## 2023-05-24 ENCOUNTER — Other Ambulatory Visit: Payer: Self-pay

## 2023-05-24 ENCOUNTER — Ambulatory Visit: Payer: PPO | Admitting: Nurse Practitioner

## 2023-05-24 MED ORDER — METOPROLOL TARTRATE 25 MG PO TABS: 25.0000 mg | ORAL_TABLET | Freq: Every day | ORAL | 0 refills | Status: DC

## 2023-07-16 DIAGNOSIS — M25571 Pain in right ankle and joints of right foot: Secondary | ICD-10-CM | POA: Diagnosis not present

## 2023-07-16 DIAGNOSIS — M25561 Pain in right knee: Secondary | ICD-10-CM | POA: Diagnosis not present

## 2023-07-19 ENCOUNTER — Other Ambulatory Visit (HOSPITAL_COMMUNITY): Payer: Self-pay | Admitting: Surgical

## 2023-07-19 ENCOUNTER — Encounter: Payer: Self-pay | Admitting: Hematology and Oncology

## 2023-07-19 ENCOUNTER — Ambulatory Visit (HOSPITAL_COMMUNITY)
Admission: RE | Admit: 2023-07-19 | Discharge: 2023-07-19 | Disposition: A | Discharge status: Home / Self Care | Source: Ambulatory Visit | Attending: Surgery | Admitting: Surgery

## 2023-07-19 DIAGNOSIS — M79661 Pain in right lower leg: Secondary | ICD-10-CM | POA: Diagnosis not present

## 2023-08-03 ENCOUNTER — Other Ambulatory Visit (HOSPITAL_BASED_OUTPATIENT_CLINIC_OR_DEPARTMENT_OTHER): Payer: Self-pay | Admitting: Student

## 2023-08-03 DIAGNOSIS — S069XAS Unspecified intracranial injury with loss of consciousness status unknown, sequela: Secondary | ICD-10-CM

## 2023-08-03 DIAGNOSIS — I480 Paroxysmal atrial fibrillation: Secondary | ICD-10-CM

## 2023-08-03 NOTE — Telephone Encounter (Signed)
 Copied from CRM [id number]. Topic: Clinical - Medication Refill >> Aug 03, 2023  2:35 PM Santiya F wrote: Medication: diazepam  (VALIUM ) 5 MG tablet [[id number]], apixaban  (ELIQUIS ) 5 MG TABS tablet [[id number]]  Has the patient contacted their pharmacy? Yes  (Agent: If yes, when and what did the pharmacy advise?) Call office. Her provider passed away and pharmacy told patient she would need to reach out to her new provider's office to have it filled.   This is the patient's preferred pharmacy:   [facility] - [place], [place] - [address] [place] [address] Phone: [phone] Fax: [phone]   Is this the correct pharmacy for this prescription? Yes If no, delete pharmacy and type the correct one.   Has the prescription been filled recently? Yes  Is the patient out of the medication? No, has a few left   Has the patient been seen for an appointment in the last year OR does the patient have an upcoming appointment? Yes  Can we respond through MyChart? Yes  Agent: Please be advised that Rx refills may take up to 3 business days. We ask that you follow-up with your pharmacy.

## 2023-08-04 DIAGNOSIS — H40053 Ocular hypertension, bilateral: Secondary | ICD-10-CM | POA: Diagnosis not present

## 2023-08-04 DIAGNOSIS — Z961 Presence of intraocular lens: Secondary | ICD-10-CM | POA: Diagnosis not present

## 2023-08-05 MED ORDER — DIAZEPAM 5 MG PO TABS: ORAL_TABLET | ORAL | 0 refills | Status: DC

## 2023-08-05 MED ORDER — APIXABAN 5 MG PO TABS: 5.0000 mg | ORAL_TABLET | Freq: Two times a day (BID) | ORAL | 3 refills | Status: AC

## 2023-08-12 ENCOUNTER — Inpatient Hospital Stay: Payer: PPO | Attending: Adult Health | Admitting: Adult Health

## 2023-08-12 ENCOUNTER — Inpatient Hospital Stay

## 2023-08-12 ENCOUNTER — Encounter: Payer: Self-pay | Admitting: Adult Health

## 2023-08-12 VITALS — BP 131/57 | HR 74 | Temp 98.6°F | Resp 19 | Wt 181.2 lb

## 2023-08-12 DIAGNOSIS — D500 Iron deficiency anemia secondary to blood loss (chronic): Secondary | ICD-10-CM

## 2023-08-12 DIAGNOSIS — D509 Iron deficiency anemia, unspecified: Secondary | ICD-10-CM | POA: Diagnosis not present

## 2023-08-12 DIAGNOSIS — Z87891 Personal history of nicotine dependence: Secondary | ICD-10-CM | POA: Insufficient documentation

## 2023-08-12 LAB — CBC WITH DIFFERENTIAL (CANCER CENTER ONLY)
Abs Immature Granulocytes: 0.01 10*3/uL (ref 0.00–0.07)
Basophils Absolute: 0.1 10*3/uL (ref 0.0–0.1)
Basophils Relative: 1 %
Eosinophils Absolute: 0.1 10*3/uL (ref 0.0–0.5)
Eosinophils Relative: 2 %
HCT: 43.9 % (ref 36.0–46.0)
Hemoglobin: 13.4 g/dL (ref 12.0–15.0)
Immature Granulocytes: 0 %
Lymphocytes Relative: 24 %
Lymphs Abs: 1.4 10*3/uL (ref 0.7–4.0)
MCH: 27.9 pg (ref 26.0–34.0)
MCHC: 30.5 g/dL (ref 30.0–36.0)
MCV: 91.5 fL (ref 80.0–100.0)
Monocytes Absolute: 0.7 10*3/uL (ref 0.1–1.0)
Monocytes Relative: 12 %
Neutro Abs: 3.6 10*3/uL (ref 1.7–7.7)
Neutrophils Relative %: 61 %
Platelet Count: 199 10*3/uL (ref 150–400)
RBC: 4.8 MIL/uL (ref 3.87–5.11)
RDW: 15.8 % — ABNORMAL HIGH (ref 11.5–15.5)
WBC Count: 5.9 10*3/uL (ref 4.0–10.5)
nRBC: 0 % (ref 0.0–0.2)

## 2023-08-12 LAB — RETIC PANEL
Immature Retic Fract: 8.8 % (ref 2.3–15.9)
RBC.: 4.7 MIL/uL (ref 3.87–5.11)
Retic Count, Absolute: 61.6 10*3/uL (ref 19.0–186.0)
Retic Ct Pct: 1.3 % (ref 0.4–3.1)
Reticulocyte Hemoglobin: 33 pg (ref >27.9)

## 2023-08-12 LAB — FERRITIN: Ferritin: 53 ng/mL (ref 11–307)

## 2023-08-12 LAB — IRON AND IRON BINDING CAPACITY (CC-WL,HP ONLY)
Iron: 43 ug/dL (ref 28–170)
Saturation Ratios: 11 % (ref 10.4–31.8)
TIBC: 381 ug/dL (ref 250–450)
UIBC: 338 ug/dL (ref 148–442)

## 2023-08-12 LAB — CMP (CANCER CENTER ONLY)
ALT: 8 U/L (ref 0–44)
AST: 15 U/L (ref 15–41)
Albumin: 4.5 g/dL (ref 3.5–5.0)
Alkaline Phosphatase: 102 U/L (ref 38–126)
Anion gap: 4 — ABNORMAL LOW (ref 5–15)
BUN: 19 mg/dL (ref 8–23)
CO2: 32 mmol/L (ref 22–32)
Calcium: 9.2 mg/dL (ref 8.9–10.3)
Chloride: 107 mmol/L (ref 98–111)
Creatinine: 0.95 mg/dL (ref 0.44–1.00)
GFR, Estimated: >60 mL/min (ref >60)
Glucose, Bld: 58 mg/dL — ABNORMAL LOW (ref 70–99)
Potassium: 3.8 mmol/L (ref 3.5–5.1)
Sodium: 143 mmol/L (ref 135–145)
Total Bilirubin: 0.3 mg/dL (ref 0.0–1.2)
Total Protein: 6.7 g/dL (ref 6.5–8.1)

## 2023-08-12 LAB — VITAMIN B12: Vitamin B-12: 799 pg/mL (ref 180–914)

## 2023-08-12 NOTE — Progress Notes (Unsigned)
 [facility] Cancer Follow up:    Lori [name] [address] [place] [address]   DIAGNOSIS: iron  deficiency anemia   SUMMARY OF HEMATOLOGIC HISTORY: Received iron  in February 05, 2018-Feraheme  510mg  Referred to Dr. Gudena for evaluation on October 30, 2021--at that time her hemoglobin was 8.2, MCV 73.5, RDW 16.5, CMP normal, TIBC 457, iron  saturation 4%, ferritin is 3 Venofer  administered at 400mg  on 11/07/2021 and 11/14/2021 Follow-up hemoglobin on January 14, 2022 demonstrated a hemoglobin of 11.8, MCV of 90, ferritin of 31, TIBC of 357, and saturation of 13%. Referral to GI placed by Dr. Lee Public in August of 2023. Hospital admission from April 17, 2022 through April 18, 2022 for symptomatic anemia requiring Protonix  drip, blood transfusion. Ferritin in f/u was 3 Venofer  400mg  given weekly x 3 beginning 04/30/2022 Colonoscopy and upper endoscopy completed on April 23, 2022.  There were polyps removed, diverticulosis noted, LA grade a reflux esophagitis with bleeding, large hiatal hernia, gastritis, and a normal examined duodenum.  She was recommended Protonix  40 mg twice daily.    CURRENT THERAPY: oral iron   INTERVAL HISTORY:  Discussed the use of AI scribe software for clinical note transcription with the patient, who gave verbal consent to proceed.  History of Present Illness Lori [name] is an 83 year old female with iron  deficiency anemia who presents for follow-up.  She has not returned for repeat iron  studies and had discontinued her oral iron  supplements until she began to feel poorly and underwent repeat lab testing a couple of months ago indicating a hemoglobin of 8.9, with iron  saturation at 5 and ferritin at 51. \  She has since restarted her oral iron  and notes that she feels the best she has felt in a long time. There is no blood in stool, easy bruising, or bleeding. No new cough, shortness of breath, chest pain, or  palpitations.     Patient Active Problem List   Diagnosis Date Noted   Arthritis 05/17/2023   Iron  deficiency anemia 04/17/2022   Thrombophilia ([name]) 04/20/2019   Spinal stenosis of lumbar region 04/20/2019   Iron  deficiency anemia due to chronic blood loss 03/08/2019   Varicose vein of leg 09/01/2018   Gastroesophageal reflux disease with esophagitis    Symptomatic anemia 02/04/2018   History of pulmonary embolism (01/2018) 02/04/2018   Senile purpura ([name]) 07/20/2017   Atrial fibrillation ([name]) 06/16/2017   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) 02/06/2015   Obesity (BMI 30.0-34.9) 02/06/2015   Medication management 11/18/2013   Hyperlipidemia, mixed    Vitamin D  deficiency    Essential hypertension     has no known allergies.  MEDICAL HISTORY: Past Medical History:  Diagnosis Date   Anemia    Arthritis    Benign neoplasm of sigmoid colon    Deafness    left ear per pt    Dyspnea    pt reports with exertion due to hip and knees    Dysrhythmia    afib    History of bleeding ulcers    2019 received 3-4 units of blood    Hyperlipidemia    Hypertension    Pericardial effusion 02/04/2018   Pre-diabetes    per Dr Berry [place] note of 09/28/19 pt denies at preop visit on 10/06/19    Prediabetes    Primary localized osteoarthritis of right hip 09/26/2019   Pulmonary embolism ([name])    hx of 2019    Vertigo    Vitamin D  deficiency  SURGICAL HISTORY: Past Surgical History:  Procedure Laterality Date   BIOPSY  02/05/2018   Procedure: BIOPSY;  Surgeon: Asencion Blacksmith, MD;  Location: [facility];  Service: Endoscopy;;   CATARACT EXTRACTION W/ INTRAOCULAR LENS  IMPLANT, BILATERAL  2009   CEREBRAL ANEURYSM REPAIR     COLONOSCOPY N/A 02/05/2018   Procedure: COLONOSCOPY;  Surgeon: Asencion Blacksmith, MD;  Location: [facility];  Service: Endoscopy;  Laterality: N/A;   ESOPHAGOGASTRODUODENOSCOPY N/A 02/05/2018   Procedure: ESOPHAGOGASTRODUODENOSCOPY (EGD);  Surgeon: Asencion Blacksmith, MD;  Location: [facility];  Service: Endoscopy;  Laterality: N/A;   FOOT SURGERY  1993   POLYPECTOMY  02/05/2018   Procedure: POLYPECTOMY;  Surgeon: Asencion Blacksmith, MD;  Location: [facility];  Service: Endoscopy;;   TOTAL HIP ARTHROPLASTY Right 10/17/2019   Procedure: TOTAL HIP ARTHROPLASTY ANTERIOR APPROACH;  Surgeon: Saundra Curl, MD;  Location: [facility];  Service: Orthopedics;  Laterality: Right;   TUBAL LIGATION      SOCIAL HISTORY: Social History   Socioeconomic History   Marital status: Divorced    Spouse name: Not on file   Number of children: 1   Years of education: 12   Highest education level: Not on file  Occupational History   Occupation: retired  Tobacco Use   Smoking status: Former    Current packs/day: 0.00    Types: Cigarettes    Quit date: 08/07/1990    Years since quitting: 33.0   Smokeless tobacco: Never  Vaping Use   Vaping status: Never Used  Substance and Sexual Activity   Alcohol use: No    Alcohol/week: 0.0 standard drinks of alcohol   Drug use: No   Sexual activity: Not on file  Other Topics Concern   Not on file  Social History Narrative   Not on file   Social Drivers of Health   Financial Resource Strain: Not on file  Food Insecurity: No Food Insecurity (04/17/2022)   Hunger Vital Sign    Worried About Running Out of Food in the Last Year: Never true    Ran Out of Food in the Last Year: Never true  Transportation Needs: No Transportation Needs (04/17/2022)   PRAPARE - Administrator, Civil Service (Medical): No    Lack of Transportation (Non-Medical): No  Physical Activity: Not on file  Stress: Not on file  Social Connections: Not on file  Intimate Partner Violence: Not At Risk (04/17/2022)   Humiliation, Afraid, Rape, and Kick questionnaire    Fear of Current or Ex-Partner: No    Emotionally Abused: No    Physically Abused: No    Sexually Abused: No    FAMILY HISTORY: Family History  Problem Relation Age  of Onset   Hypertension Mother    Diabetes Mother    Cancer Mother        Pancreatic   Cancer Father        Skin   Colon cancer Neg Hx    Stomach cancer Neg Hx    Esophageal cancer Neg Hx     Review of Systems  Constitutional:  Negative for appetite change, chills, fatigue, fever and unexpected weight change.  HENT:   Negative for hearing loss, lump/mass and trouble swallowing.   Eyes:  Negative for eye problems and icterus.  Respiratory:  Negative for chest tightness, cough and shortness of breath.   Cardiovascular:  Negative for chest pain, leg swelling and palpitations.  Gastrointestinal:  Negative for abdominal distention, abdominal pain, constipation,  diarrhea, nausea and vomiting.  Endocrine: Negative for hot flashes.  Genitourinary:  Negative for difficulty urinating.   Musculoskeletal:  Negative for arthralgias.  Skin:  Negative for itching and rash.  Neurological:  Negative for dizziness, extremity weakness, headaches and numbness.  Hematological:  Negative for adenopathy. Does not bruise/bleed easily.  Psychiatric/Behavioral:  Negative for depression. The patient is not nervous/anxious.       PHYSICAL EXAMINATION    Vitals:   08/12/23 1204  BP: (!) 131/57  Pulse: 74  Resp: 19  Temp: 98.6 F (37 C)  SpO2: 98%    Physical Exam Constitutional:      General: She is not in acute distress.    Appearance: Normal appearance. She is not toxic-appearing.  HENT:     Head: Normocephalic and atraumatic.     Mouth/Throat:     Mouth: Mucous membranes are moist.     Pharynx: Oropharynx is clear. No oropharyngeal exudate or posterior oropharyngeal erythema.  Eyes:     General: No scleral icterus. Cardiovascular:     Rate and Rhythm: Normal rate and regular rhythm.     Pulses: Normal pulses.     Heart sounds: Normal heart sounds.  Pulmonary:     Effort: Pulmonary effort is normal.     Breath sounds: Normal breath sounds.  Abdominal:     General: Abdomen is flat.  Bowel sounds are normal. There is no distension.     Palpations: Abdomen is soft.     Tenderness: There is no abdominal tenderness.  Musculoskeletal:        General: No swelling.     Cervical back: Neck supple.  Lymphadenopathy:     Cervical: No cervical adenopathy.  Skin:    General: Skin is warm and dry.     Findings: No rash.  Neurological:     General: No focal deficit present.     Mental Status: She is alert.  Psychiatric:        Mood and Affect: Mood normal.        Behavior: Behavior normal.     LABORATORY DATA:  CBC    Component Value Date/Time   WBC 5.7 05/17/2023 1140   WBC 5.4 02/16/2023 1149   RBC 3.52 (L) 05/17/2023 1140   RBC 4.39 02/16/2023 1149   HGB 8.9 (L) 05/17/2023 1140   HCT 30.0 (L) 05/17/2023 1140   PLT 284 05/17/2023 1140   MCV 85 05/17/2023 1140   MCH 25.3 (L) 05/17/2023 1140   MCH 26.7 (L) 02/16/2023 1149   MCHC 29.7 (L) 05/17/2023 1140   MCHC 30.5 (L) 02/16/2023 1149   RDW 18.5 (H) 05/17/2023 1140   LYMPHSABS 1.0 05/17/2023 1140   MONOABS 0.5 08/11/2022 1051   EOSABS 0.0 05/17/2023 1140   BASOSABS 0.0 05/17/2023 1140    CMP     Component Value Date/Time   NA 144 05/17/2023 1140   K 5.3 (H) 05/17/2023 1140   CL 109 (H) 05/17/2023 1140   CO2 21 05/17/2023 1140   GLUCOSE 89 05/17/2023 1140   GLUCOSE 76 02/16/2023 1149   BUN 22 05/17/2023 1140   CREATININE 0.97 05/17/2023 1140   CREATININE 1.09 (H) 02/16/2023 1149   CALCIUM 8.8 05/17/2023 1140   PROT 6.0 05/17/2023 1140   ALBUMIN  4.2 05/17/2023 1140   AST 16 05/17/2023 1140   AST 17 08/11/2022 1051   ALT 8 05/17/2023 1140   ALT 9 08/11/2022 1051   ALKPHOS 90 05/17/2023 1140   BILITOT 0.2 05/17/2023 1140  BILITOT 0.4 08/11/2022 1051   GFRNONAA >60 08/11/2022 1051   GFRNONAA 61 07/02/2020 1519   GFRAA 71 07/02/2020 1519     ASSESSMENT and THERAPY PLAN:   Assessment and Plan Assessment & Plan Iron  deficiency anemia Iron  deficiency anemia with low hemoglobin, iron   saturation, and ferritin. Improvement noted in symptoms. - Repeat iron  studies today. - Continue oral iron  supplements. - Schedule follow-up in six months with repeat labs.   All questions were answered. The patient knows to call the clinic with any problems, questions or concerns. We can certainly see the patient much sooner if necessary.  Total encounter time:20 minutes*in face-to-face visit time, chart review, lab review, care coordination, order entry, and documentation of the encounter time.    Alwin Baars, NP 08/12/23 12:09 PM Medical Oncology and Hematology [facility] [address] [place], [place] [address] Tel. [phone]    Fax. [phone]  *Total Encounter Time as defined by the Centers for Medicare and Medicaid Services includes, in addition to the face-to-face time of a patient visit (documented in the note above) non-face-to-face time: obtaining and reviewing outside history, ordering and reviewing medications, tests or procedures, care coordination (communications with other health care professionals or caregivers) and documentation in the medical record.

## 2023-08-13 ENCOUNTER — Ambulatory Visit: Payer: Self-pay

## 2023-08-13 ENCOUNTER — Encounter: Payer: Self-pay | Admitting: Hematology and Oncology

## 2023-08-13 NOTE — Telephone Encounter (Signed)
-----   Message from Conway Dennis sent at 08/13/2023  8:38 AM EDT ----- Please let patient know that her labs look good.  I also want her to undergo lab and f/u in 6 months.  I put in LOS but it has not yet been scheduled.  Can you follow up? ----- Message ----- From: Interface, Lab In [place] Sent: 08/12/2023   1:54 PM EDT To: Percival Brace, NP

## 2023-08-13 NOTE — Telephone Encounter (Signed)
 Attempted to call pt per NP. LVM for call back. She is scheduled for labs and f/u 02/14/24

## 2023-08-18 ENCOUNTER — Other Ambulatory Visit: Payer: Self-pay

## 2023-08-18 ENCOUNTER — Encounter (INDEPENDENT_AMBULATORY_CARE_PROVIDER_SITE_OTHER): Payer: Self-pay

## 2023-08-18 ENCOUNTER — Ambulatory Visit (INDEPENDENT_AMBULATORY_CARE_PROVIDER_SITE_OTHER): Admitting: Family Medicine

## 2023-08-18 VITALS — BP 120/78 | HR 67 | Ht 65.0 in | Wt 185.0 lb

## 2023-08-18 DIAGNOSIS — I517 Cardiomegaly: Secondary | ICD-10-CM

## 2023-08-18 DIAGNOSIS — E782 Mixed hyperlipidemia: Secondary | ICD-10-CM

## 2023-08-18 DIAGNOSIS — R072 Precordial pain: Secondary | ICD-10-CM | POA: Insufficient documentation

## 2023-08-18 DIAGNOSIS — I2510 Atherosclerotic heart disease of native coronary artery without angina pectoris: Secondary | ICD-10-CM

## 2023-08-18 DIAGNOSIS — E119 Type 2 diabetes mellitus without complications: Secondary | ICD-10-CM

## 2023-08-18 DIAGNOSIS — I10 Essential (primary) hypertension: Secondary | ICD-10-CM

## 2023-08-18 DIAGNOSIS — R9431 Abnormal electrocardiogram [ECG] [EKG]: Secondary | ICD-10-CM

## 2023-08-18 DIAGNOSIS — K219 Gastro-esophageal reflux disease without esophagitis: Secondary | ICD-10-CM

## 2023-08-18 DIAGNOSIS — Z955 Presence of coronary angioplasty implant and graft: Secondary | ICD-10-CM

## 2023-08-18 DIAGNOSIS — R0989 Other specified symptoms and signs involving the circulatory and respiratory systems: Secondary | ICD-10-CM

## 2023-08-18 DIAGNOSIS — I219 Acute myocardial infarction, unspecified: Secondary | ICD-10-CM

## 2023-08-18 DIAGNOSIS — I491 Atrial premature depolarization: Secondary | ICD-10-CM

## 2023-08-18 DIAGNOSIS — G4733 Obstructive sleep apnea (adult) (pediatric): Secondary | ICD-10-CM

## 2023-08-18 MED ORDER — NITROGLYCERIN 0.4 MG SUBLINGUAL TABLET
0.4000 mg | SUBLINGUAL_TABLET | SUBLINGUAL | 3 refills | Status: AC | PRN
Start: 2023-08-18 — End: ?

## 2023-08-18 MED ORDER — ISOSORBIDE MONONITRATE ER 60 MG TABLET,EXTENDED RELEASE 24 HR
60.0000 mg | ORAL_TABLET | Freq: Every morning | ORAL | 4 refills | Status: AC
Start: 2023-08-18 — End: ?

## 2023-08-18 MED ORDER — ATORVASTATIN 40 MG TABLET
80.0000 mg | ORAL_TABLET | Freq: Every evening | ORAL | 2 refills | Status: DC
Start: 2023-08-18 — End: 2023-09-24

## 2023-08-18 MED ORDER — CLOPIDOGREL 75 MG TABLET
75.0000 mg | ORAL_TABLET | Freq: Every day | ORAL | 3 refills | Status: AC
Start: 2023-08-18 — End: 2024-08-17

## 2023-08-18 MED ORDER — METOPROLOL SUCCINATE ER 25 MG TABLET,EXTENDED RELEASE 24 HR
25.0000 mg | ORAL_TABLET | Freq: Every day | ORAL | 3 refills | Status: AC
Start: 2023-08-18 — End: 2024-08-17

## 2023-08-18 MED ORDER — ISOSORBIDE MONONITRATE ER 60 MG TABLET,EXTENDED RELEASE 24 HR
60.0000 mg | ORAL_TABLET | Freq: Every day | ORAL | 3 refills | Status: DC
Start: 2023-08-18 — End: 2023-08-18

## 2023-08-18 NOTE — Procedures (Signed)
 CARDIOLOGY [facility] [place], [facility]  [address] [place] [address]    Procedure Note    Name: Melissa Wolf MRN:  [id number]   Date: 08/18/2023 DOB:  Nov 26, 1940 (82 y.o.)         ECG - In Clinic (Non-[facility] )    Performed by: Erby Hatcher, MD  Authorized by: Erby Hatcher, MD    Documentation:      Sinus rhythm with LVH and high lateral ST-T abnormalities      Erby Hatcher, MD

## 2023-08-18 NOTE — Progress Notes (Signed)
 Cardiology [facility], [facility], [facility]  [address] [place] [address]  [phone]    Cardiology  Clinic Note    Name: Melissa Wolf   DOB: 1941/03/16  [83 y.o. female]   MRN: [id number]       Visit Date: 08/18/2023   Referring: No referring provider defined for this encounter.   PCP: Skippy Dun, DO         Chief Complaint: Annual Exam and Pre-op Evaluation      History of Present Illness   Melissa Wolf is an 83 y.o. female patient with a history of coronary artery disease and PCI of the circumflex in 2010 and of the LAD in 2016.  Unstable anginal symptoms/non ST segment elevated MI prompted cardiac catheterization in 2023 that revealed severe progression of disease with chronic occlusion of the circumflex and collateral filling with severe LAD, and severe right coronary stenosis with inferolateral hypokinesis and an EF of 50% on prior nuclear testing.  She went back to the cath lab in December, 2023 and underwent successful "last resort" catheter intervention of the left main/ostial LAD with a Synergy drug-eluting stent and 3 lesions in the right coronary with 3 synergy drug-eluting stents. The circumflex was not amenable to catheter intervention. She tolerated the procedure well.     She presents today for annual visit.  She has no acute cardiac complaints.  She denies anginal-type chest discomfort, PND, orthopnea, presyncope, syncope and edema.        Allergies  Allergies   Allergen Reactions    Codeine      Pt doesn't remember exact rxn       History  Past Medical History:   Diagnosis Date    Atrial ectopy     CAD (coronary artery disease)     Diabetes mellitus, type 2     GERD (gastroesophageal reflux disease)     History of coronary artery stent placement     HTN (hypertension)     Hyperlipidemia     Myocardial infarction     OA (osteoarthritis)     OSA (obstructive sleep apnea)     PAD (peripheral artery disease) (CMS [name])     Syncope and  collapse     Wears glasses          Social History     Socioeconomic History    Marital status: Widowed   Tobacco Use    Smoking status: Never    Smokeless tobacco: Never   Vaping Use    Vaping status: Never Used   Substance and Sexual Activity    Alcohol use: Not Currently    Drug use: Never     Family Medical History:       Problem Relation (Age of Onset)    Coronary Artery Disease Sister    Heart Attack Sister            Past Surgical History:   Procedure Laterality Date    HAND SURGERY      Plate in right hand from fx    HX CORONARY STENT PLACEMENT      HX HYSTERECTOMY      HX TONSILLECTOMY      SHOULDER SURGERY Bilateral        Medications    Current Outpatient Medications:     aspirin (ECOTRIN) 81 mg Oral Tablet, Delayed Release (E.C.), Take 1 Tablet (81 mg total) by mouth Daily, Disp: , Rfl:  atorvastatin  (LIPITOR) 40 mg Oral Tablet, TAKE TWO TABLETS BY MOUTH AT BEDTIME, Disp: 60 Tablet, Rfl: 2    clopidogreL  (PLAVIX ) 75 mg Oral Tablet, Take 1 Tablet (75 mg total) by mouth Daily, Disp: 90 Tablet, Rfl: 3    ergocalciferol, vitamin D2, (DRISDOL) 1,250 mcg (50,000 unit) Oral Capsule, Take 1 Capsule (50,000 Units total) by mouth Every 7 days, Disp: , Rfl:     gabapentin  (NEURONTIN ) 100 mg Oral Capsule, Take 1 Capsule (100 mg total) by mouth Three times a day for 5 days, Disp: 15 Capsule, Rfl: 0    insulin glargine 100 unit/mL Subcutaneous injection (vial), Inject under the skin As directed, Disp: , Rfl:     isosorbide  mononitrate (IMDUR ) 60 mg Oral Tablet Sustained Release 24 hr, Take 1 Tablet (60 mg total) by mouth Daily, Disp: 90 Tablet, Rfl: 3    metFORMIN (GLUCOPHAGE) 500 mg Oral Tablet, Take 1 Tablet (500 mg total) by mouth Twice daily with food, Disp: , Rfl:     metoprolol  succinate (TOPROL -XL) 25 mg Oral Tablet Sustained Release 24 hr, Take 1 Tablet (25 mg total) by mouth Daily, Disp: 90 Tablet, Rfl: 3    nitroGLYCERIN  (NITROSTAT ) 0.4 mg Sublingual Tablet, Sublingual, Place 1 Tablet (0.4 mg total) under  the tongue Every 5 minutes as needed for Chest pain for 3 doses over 15 minutes, Disp: 25 Tablet, Rfl: 3    NOVOLOG U-100 INSULIN ASPART SUBQ, Inject under the skin, Disp: , Rfl:     sertraline (ZOLOFT) 25 mg Oral Tablet, Take 1 Tablet (25 mg total) by mouth Daily, Disp: , Rfl:     syringe-needle,insulin,0.5 mL (INSULIN SYRINGE N/A), , Disp: , Rfl:     Review of Systems:  Constitutional: No significant weight gain or loss, fatigue or fever.  Respiratory:  Shortness of breath.  No emphysema, COPD, black lung, or sleep apnea  Cardiovascular: No chest pain, palpitations, orthopnea, PND, claudication or edema.  Gastrointestinal: No nausea, vomiting, diarrhea.  Endocrine:  Diabetes.  No thyroid problems  Musculoskeletal:  Arthritis.  No history of gout.  Neurologic:  Dizziness.  No history of strokes or seizures, presyncope or syncope.  Psychiatric:  Anxiety and depression.          Vital Signs:  BP 120/78   Pulse 67   Ht 1.651 m (5\' 5" )   Wt 83.9 kg (185 lb)   SpO2 98%   BMI 30.79 kg/m         Physical Exam:  General: Alert, no acute distress  Neck:  Right carotid bruit.  No JVD  Lungs: Clear to auscultation bilaterally, non-labored respiration.  Heart: Normal rate, regular rhythm, 2/6 systolic murmur RUSB.  Abdomen: Soft, non-tender.  Neurologic: No motor or sensory deficits.  Extremities:  No edema .          Diagnostics  ECG:  Sinus rhythm with LVH and high lateral ST-T abnormalities    Medications Discontinued During This Encounter   Medication Reason    TRESIBA FLEXTOUCH U-100 100 unit/mL (3 mL) Subcutaneous Insulin Pen Patient states no longer taking    pantoprazole (PROTONIX) 40 mg Oral Tablet, Delayed Release (E.C.) Patient states no longer taking    nitroGLYCERIN  (NITROSTAT ) 0.4 mg Sublingual Tablet, Sublingual Reorder    atorvastatin  (LIPITOR) 40 mg Oral Tablet Reorder    metoprolol  succinate (TOPROL -XL) 25 mg Oral Tablet Sustained Release 24 hr Reorder    isosorbide  mononitrate (IMDUR ) 60 mg Oral Tablet  Sustained Release 24 hr Reorder  clopidogreL  (PLAVIX ) 75 mg Oral Tablet Reorder       Assessment and Plan:    ICD-10-CM    1. HTN (hypertension)  I10       2. Mixed hyperlipidemia  E78.2       3. CAD (coronary artery disease)  I25.10 ECG - In Clinic (Non-[facility] )      4. History of coronary artery stent placement  Z95.5       5. Atrial ectopy  I49.1       6. Myocardial infarction  I21.9       7. Right carotid bruit  R09.89       8. OSA on CPAP  G47.33       9. GERD (gastroesophageal reflux disease)  K21.9       10. DM (diabetes mellitus)  E11.9       11. Atherosclerotic heart disease of native coronary artery without angina pectoris  I25.10 metoprolol  succinate (TOPROL -XL) 25 mg Oral Tablet Sustained Release 24 hr      12. Coronary atherosclerosis  I25.10 metoprolol  succinate (TOPROL -XL) 25 mg Oral Tablet Sustained Release 24 hr      13. Precordial pain  R07.2 isosorbide  mononitrate (IMDUR ) 60 mg Oral Tablet Sustained Release 24 hr          Ms. Corlett appears to be stable overall from a coronary standpoint.  Her blood pressure is adequately controlled, and her EKG tracing does not show any acute change.  For now she will continue her current therapy; no medication changes were made today.  We will see her back here in 1 year for re-evaluation, or sooner if necessary.    Thank you for allowing us  to participate in the care of your patient.  Please do not hesitate to contact us  with any questions or concerns.    Follow up:  No follow-ups on file.      Erby Hatcher, MD   [facility]  Cardiology  [facility]    A portion of this documentation may have been generated using [facility] voice recognition software and may contain syntax/voice recognition errors.

## 2023-08-27 ENCOUNTER — Encounter (INDEPENDENT_AMBULATORY_CARE_PROVIDER_SITE_OTHER): Payer: Self-pay | Admitting: Physician Assistant

## 2023-09-06 ENCOUNTER — Encounter: Payer: PPO | Admitting: Internal Medicine

## 2023-09-07 ENCOUNTER — Other Ambulatory Visit (HOSPITAL_COMMUNITY): Payer: Self-pay | Admitting: Surgery

## 2023-09-07 ENCOUNTER — Encounter (HOSPITAL_BASED_OUTPATIENT_CLINIC_OR_DEPARTMENT_OTHER): Payer: Self-pay | Admitting: Surgery

## 2023-09-07 DIAGNOSIS — Z01818 Encounter for other preprocedural examination: Secondary | ICD-10-CM

## 2023-09-07 NOTE — OR PreOp (Addendum)
 NO CHEST PAIN, DR MAURIE CARDIOLOGIST, RECENT OFFICE NOTES ON CHART FROM 08/2022.

## 2023-09-07 NOTE — OR PreOp (Signed)
 Melissa Wolf   Pre-Admission Testing Nurse: [phone]  Surgery Date: 09/16/2023  PAT 09/10/2023 0800  Arrive at: ONE DAY SURGERY  Procedure: 25G, VITRECTOMY POSTERIOR: 32963 (CPT)    VITRECTOMY POSTERIOR WITH MEMBRANE PEEL: 67042 (CPT)  [phone] (CPT)  [phone] (CPT)      Bring a complete and accurate list of your medications (including dosages) AND past medical and surgical history. This helps to ensure you received the safest care possible.   If you are taking any over-the-counter substances, such as herbal supplements, diet pills, pain relievers or other non prescription medication, please notify the Pre-Admission Testing Nurse, your doctor and any other health team member interviewing you or performing any assessment. DO NOT take any over-the-counter medications for 8 days prior to your procedure, unless otherwise instructed by your physician. Failure to disclose this information could be life threatening.   DO NOT eat or drink anything after midnight prior to surgery including: NO GUM, NO MINTS, NO TOBACCO OR TOBACCO PRODUCTS OF ANY KIND - 24 HOURS PRIOR TO SURGERY. (including but not limited to: cigarettes, cigars, chewing tobacco, snuff, pipe, vaping etc.), Approved medications may be taken with a sip of water the morning of your surgery. You may brush your teeth the morning of your surgery.   Do not wear make-up, lotions, or colored fingernail polish the day of surgery.   Leave all jewelry (including piercing) at home due to the risk of injury. No jewelry will be permitted in the Operating Room. ALL piercings must be removed, metal, plastic, silicone, etc.   If you have any prosthetics, please inform your pre-admission testing nurse.   Wear loose-fitting, comfortable clothing.   If you require reading glasses or hearing aids, please bring them with you the day of surgery. Your dentures can remain in place until immediately before your procedure, but do not use any denture adhesive. They will be  returned once you are awake and alert in the Recovery Room.   If you require interpretive services, our facility will provide these for you. Please ask if you have any questions regarding this service.   If required for your procedure, you will be given a Hibiclens Soap Kit with instructions for use at home. The kit is to be used the night before AND the morning of surgery following the instructions given.   When you arrive at the area specified, the nurse will prepare you for your procedure and complete any additional blood work/paperwork. An IV will be started as needed.   One support person/family member will be able to join you in the pre-operative area once the per-op nurse has prepared you for your procedure. Children may have both parents with them.   When you are taken to the Operating Room, your family will be shown to the waiting room. Your family will be provided with a case number so they can track the progress of your surgical case while in the waiting area. When your procedure has been completed, your surgeon will speak to your family.   After your procedure, you will be taken to the Recovery Room until you are awake and alert (30 minutes - 2 hours). You will then be transported to the appropriate area for further care.   You can expect to experience some discomfort and possible nausea. Discuss this with your nurse so that medications can be given, if appropriate.   Some procedures may require you to assume a special position in the recovery period. We will explain  this if it applies to you.   It may also be necessary for you to have tubes after surgery (catheter, drains, from incision).   After surgery, you will need to breathe deeply and to cough (helping to prevent a form of pneumonia that can occur after having general anesthesia).   Medications to be taken the morning of surgery with a sip of water: ISOSORBIDE , METOPROLOL    Last dose of Metformin 48 hours prior to surgery: 09/14/2023  0700   Additional comments: NO OTC MEDS 8 DAYS PRIOR TO SURGERY  NO DIABETIC MEDS THE DAY OF SURGERY   If you use CPAP/BiPAP, bring your machine with you on day of surgery if you will be staying overnight.  For those patients having same-day surgery:   Arrange for transportation home with a responsible adult who will remain with you for 24 hours after your procedure. The adult must be present with you prior to the procedure. You will not be allowed to proceed with the planned surgery if you are not accompanied by an appropriate post-procedure caregiver.   When you are able to tolerate fluids, urinate, etc, you will receive discharge instructions for your care at home (both verbal and written).   After completing all of the above, you will be discharged to home with instructions to follow-up   If you have any additional questions, please contact your surgeon's office or the Pre-Admission Testing Nurse at [phone].   Please follow any specific instructions from you surgeon's office concerning aspirin/ other blood thinners, physical therapy, or bowel preps/enemas, etc. PER SURGEON (PLAVIX  AND ASPIRIN)    SIGNATURE: ___________________________________________________.    DATE:  ___________________________________________________.

## 2023-09-10 ENCOUNTER — Encounter (HOSPITAL_BASED_OUTPATIENT_CLINIC_OR_DEPARTMENT_OTHER): Payer: Self-pay | Admitting: Surgery

## 2023-09-10 ENCOUNTER — Other Ambulatory Visit (HOSPITAL_COMMUNITY): Payer: Self-pay | Admitting: Surgery

## 2023-09-10 ENCOUNTER — Other Ambulatory Visit: Payer: Self-pay

## 2023-09-10 ENCOUNTER — Ambulatory Visit
Admission: RE | Admit: 2023-09-10 | Discharge: 2023-09-10 | Disposition: A | Discharge status: Home / Self Care | Payer: Self-pay | Source: Ambulatory Visit | Attending: Surgery | Admitting: Surgery

## 2023-09-10 DIAGNOSIS — Z01818 Encounter for other preprocedural examination: Secondary | ICD-10-CM | POA: Insufficient documentation

## 2023-09-10 LAB — CBC WITH DIFF
BASOPHIL #: <0.04 10*3/uL (ref <=0.20)
BASOPHIL %: 0.3 %
EOSINOPHIL #: 0.17 10*3/uL (ref <=0.50)
EOSINOPHIL %: 2.9 %
HCT: 37.5 % (ref 34.8–46.0)
HGB: 12.1 g/dL (ref 11.5–16.0)
IMMATURE GRANULOCYTE #: <0.04 10*3/uL (ref <0.10)
IMMATURE GRANULOCYTE %: 0.2 % (ref 0.0–1.0)
LYMPHOCYTE #: 1.35 10*3/uL (ref 1.00–4.80)
LYMPHOCYTE %: 23.1 %
MCH: 28.4 pg (ref 26.0–32.0)
MCHC: 32.3 g/dL (ref 31.0–35.5)
MCV: 88 fL (ref 78.0–100.0)
MONOCYTE #: 0.43 10*3/uL (ref 0.20–1.10)
MONOCYTE %: 7.4 %
MPV: 11.1 fL (ref 8.7–12.5)
NEUTROPHIL #: 3.86 10*3/uL (ref 1.50–7.70)
NEUTROPHIL %: 66.1 %
PLATELETS: 217 10*3/uL (ref 150–400)
RBC: 4.26 10*6/uL (ref 3.85–5.22)
RDW-CV: 12.7 % (ref 11.5–15.5)
WBC: 5.8 10*3/uL (ref 3.7–11.0)

## 2023-09-10 LAB — BASIC METABOLIC PANEL
ANION GAP: 11 mmol/L (ref 7–18)
BUN/CREA RATIO: 27
BUN: 24 mg/dL — ABNORMAL HIGH (ref 8–23)
CALCIUM: 9.7 mg/dL (ref 8.3–10.7)
CHLORIDE: 102 mmol/L (ref 96–106)
CO2 TOTAL: 27 mmol/L (ref 22–30)
CREATININE: 0.88 mg/dL (ref 0.50–0.90)
ESTIMATED GFR: 66 mL/min/{1.73_m2} — ABNORMAL LOW (ref >90)
GLUCOSE: 155 mg/dL — ABNORMAL HIGH (ref 74–109)
POTASSIUM: 4.8 mmol/L (ref 3.2–5.0)
SODIUM: 140 mmol/L (ref 133–144)

## 2023-09-10 LAB — URINALYSIS, MACRO/MICRO
BILIRUBIN: NEGATIVE mg/dL
BLOOD: NEGATIVE mg/dL
GLUCOSE: NEGATIVE mg/dL
KETONES: NEGATIVE mg/dL
NITRITE: NEGATIVE
PH: 7 (ref 5.0–7.0)
PROTEIN: NEGATIVE mg/dL
SPECIFIC GRAVITY: 1.025 (ref 1.005–1.025)
UROBILINOGEN: 1 mg/dL

## 2023-09-10 NOTE — H&P (Signed)
 [address] [facility] [address] [facility] ONE DAY SURGERY  [address]  [place] [address]  Preoperative History and Physical       Patient Name: Melissa Wolf  Age: 83 y.o.  Sex: female  DOB: March 25, 1940  MRN: [id number]  Date: 09/10/2023     Planned Procedure:   Posterior vitrectomy with membrane ectomy left eye Dr Lewayne 09/16/2023    HISTORY OF PRESENT ILLNESS:    Melissa Wolf is a 83 y.o. year old female who presents to pre-admission testing prior to a scheduled procedure with Dr. Lewayne. She has past medical history significant for coronary artery disease status post PCI, diabetes mellitus type 2, gastroesophageal reflux disease, hypertension, hyperlipidemia, myocardial infarction, osteoarthritis, OSA, peripheral artery disease, history of shingles, and history of syncope.. She complains of sudden onset loss of vision in the left eye around March 2025.  States that she woke up 1 morning and was unable to see out of her left eye.  She has been treating the left eye issues in the retina consult in office conservatively with injections and laser surgeries.  None of these have been significantly successful in restoring revision. Melissa Wolf  She was seen and evaluated by Dr Lewayne and recommended for the above procedure. She denies chest pain or shortness breath. She denies recent illness, fever or chills. She was provided with all pre-procedural instructions and will present for surgery on 09/16/2023.    Patient does have a significant cardiac history.  Patient had a PCI to the circumflex in 2010 and PCI to the LAD in 2016.  Patient suffered a non ST elevated MI in 2013 which revealed severe progression of coronary artery disease with a chronic occlusion of the circumflex with collateral filling and severe LAD and severe right coronary stenosis.  Inferior lateral hypokinesis and an EF of 50% was evident on her previous stress testing.  Melissa of 2023 she underwent successful  salvage PCI of the left main in the ostial LAD receiving drug-eluting stents.  She also received 3 drug-eluting stents to the right coronary artery at that setting as well.  Circumflex was left as chronic total occluded.  She was recently evaluated in the office of Dr. Maurie her cardiologist August 18, 2023.  She denies any current cardiac complaints.  EKG performed at her cardiology office on June 4 revealed sinus rhythm with LVH and high lateral ST-T wave abnormalities.  This was read by her cardiologist revealing no acute changes.  She was recommended a 1 year follow up evaluation as her cardiac status was stable.  EKG is available in media tab for review and cardiac notes are available in epic.  Cardiac clearance letter also available in epic for review.    Past Medical History:  Medical History     Diagnosis Date Comment Source    Atrial ectopy       CAD (coronary artery disease)       Diabetes mellitus, type 2       GERD (gastroesophageal reflux disease)       History of coronary artery stent placement       HTN (hypertension)       Hyperlipidemia       Myocardial infarction       OA (osteoarthritis)       OSA (obstructive sleep apnea)       PAD (peripheral artery disease) (CMS [name])       Rash  ARM RASH DUE TO NERVES     Shingles  Syncope and collapse       Type 2 diabetes mellitus       Wears glasses            Surgical History:  Past Surgical History:   Procedure Laterality Date    CATARACT EXTRACTION W/ INTRAOCULAR LENS IMPLANT Bilateral     HAND SURGERY      Plate in right hand from fx    HX CORONARY STENT PLACEMENT      X7 STENT  LAST STENT 2023    HX HYSTERECTOMY      HX TONSILLECTOMY          Family History:  Family Medical History:       Problem Relation (Age of Onset)    Coronary Artery Disease Sister    Heart Attack Sister              Social History:  Social History     Socioeconomic History    Marital status: Widowed   Tobacco Use    Smoking status: Never    Smokeless tobacco: Never   Vaping  Use    Vaping status: Never Used   Substance and Sexual Activity    Alcohol use: Not Currently    Drug use: Never        HOME MEDICATIONS:  Prior to Admission medications    Medication Sig Start Date End Date Taking? Authorizing Provider   aspirin (ECOTRIN) 81 mg Oral Tablet, Delayed Release (E.C.) Take 1 Tablet (81 mg total) by mouth Daily   Yes Provider, Historical   atorvastatin  (LIPITOR) 40 mg Oral Tablet TAKE TWO TABLETS BY MOUTH AT BEDTIME 08/18/23 08/17/24 Yes Raguel Mom, MD   clopidogreL  (PLAVIX ) 75 mg Oral Tablet Take 1 Tablet (75 mg total) by mouth Daily 08/18/23 08/17/24 Yes Raguel Mom, MD   ergocalciferol, vitamin D2, (DRISDOL) 1,250 mcg (50,000 unit) Oral Capsule Take 1 Capsule (50,000 Units total) by mouth Every 7 days   Yes Provider, Historical   gabapentin  (NEURONTIN ) 100 mg Oral Capsule Take 1 Capsule (100 mg total) by mouth Three times a day for 5 days 12/30/22 09/07/23 Yes Stowell, Trell, PA-C   insulin glargine 100 unit/mL Subcutaneous injection (vial) Inject 35 Units under the skin Twice daily - in morning and at bedtime As directed  25UNIT NIGHTLY   Yes Provider, Historical   isosorbide  mononitrate (IMDUR ) 60 mg Oral Tablet Sustained Release 24 hr Take 1 Tablet (60 mg total) by mouth Every morning 08/18/23  Yes Raguel Mom, MD   metFORMIN (GLUCOPHAGE) 500 mg Oral Tablet Take 1 Tablet (500 mg total) by mouth Twice daily with food   Yes Provider, Historical   metoprolol  succinate (TOPROL -XL) 25 mg Oral Tablet Sustained Release 24 hr Take 1 Tablet (25 mg total) by mouth Daily 08/18/23 08/17/24 Yes Raguel Mom, MD   nitroGLYCERIN  (NITROSTAT ) 0.4 mg Sublingual Tablet, Sublingual Place 1 Tablet (0.4 mg total) under the tongue Every 5 minutes as needed for Chest pain for 3 doses over 15 minutes 08/18/23  Yes Raguel Mom, MD   NOVOLOG U-100 INSULIN ASPART SUBQ Inject 5 Units under the skin Once a day SLIDING SCALE   Yes Provider, Historical   sertraline (ZOLOFT) 25 mg Oral Tablet Take 1 Tablet (25 mg total)  by mouth Daily   Yes Provider, Historical   syringe-needle,insulin,0.5 mL (INSULIN SYRINGE N/A)     Provider, Historical   atorvastatin  (LIPITOR) 40 mg Oral Tablet TAKE TWO TABLETS BY MOUTH AT BEDTIME 03/06/22 08/18/23  Julio Mering  Nat, PA-C   clopidogreL  (PLAVIX ) 75 mg Oral Tablet Take 1 Tablet (75 mg total) by mouth Once a day 01/14/23 08/18/23  Raguel Mom, MD   isosorbide  mononitrate (IMDUR ) 60 mg Oral Tablet Sustained Release 24 hr Take 1 Tablet (60 mg total) by mouth Once a day 01/14/23 08/18/23  Raguel Mom, MD   isosorbide  mononitrate (IMDUR ) 60 mg Oral Tablet Sustained Release 24 hr Take 1 Tablet (60 mg total) by mouth Daily 08/18/23 08/18/23  Raguel Mom, MD   metoprolol  succinate (TOPROL -XL) 25 mg Oral Tablet Sustained Release 24 hr Take 1 Tablet (25 mg total) by mouth Once a day 01/14/23 08/18/23  Raguel Mom, MD   nitroGLYCERIN  (NITROSTAT ) 0.4 mg Sublingual Tablet, Sublingual Place 1 Tablet (0.4 mg total) under the tongue Every 5 minutes as needed for Chest pain for 3 doses over 15 minutes 02/23/22 08/18/23  Chinita Katz, PA-C   pantoprazole (PROTONIX) 40 mg Oral Tablet, Delayed Release (E.C.) Take 1 Tablet (40 mg total) by mouth Once a day  08/18/23  Provider, Historical   TRESIBA FLEXTOUCH U-100 100 unit/mL (3 mL) Subcutaneous Insulin Pen INJECT 26 UNITS IN AM AND 30 UNITS IN PM SUBCUTANEOUSLY EVERY DAY AT BEDTIME 03/13/22 08/18/23  Provider, Historical        ALLERGIES:    Allergy History as of 09/10/23       CODEINE         Noted Status Severity Type Reaction    09/01/20 2003 Kennyth Shell, RN 09/01/20 Active       Comments: Pt doesn't remember exact rxn     09/01/20 2003 Kennyth Shell, RN 09/01/20 Active                 SEMAGLUTIDE         Noted Status Severity Type Reaction    09/07/23 1420 Ashley Agent, RN 09/07/23 Active Low  Nausea/ Vomiting                     REVIEW OF SYSTEMS:   Review of Systems   Constitutional: Negative.  Negative for chills and fever.   HENT: Negative.   Negative for ear discharge, ear pain, sore throat and tinnitus.    Eyes:  Positive for blurred vision. Negative for pain and discharge.   Respiratory: Negative.  Negative for cough, shortness of breath and wheezing.    Cardiovascular: Negative.  Negative for chest pain, palpitations and orthopnea.   Gastrointestinal: Negative.  Negative for abdominal pain, heartburn, nausea and vomiting.   Genitourinary:  Positive for dysuria. Negative for frequency, hematuria and urgency.        Patient complains of some burning upon urination.  UA collected today   Musculoskeletal: Negative.    Skin: Negative.  Negative for itching and rash.   Neurological: Negative.  Negative for dizziness, tingling, seizures, weakness and headaches.   Endo/Heme/Allergies: Negative.    Psychiatric/Behavioral: Negative.          Labs and Imaging:  Results for orders placed or performed during the hospital encounter of 09/10/23 (from the past week)   CBC/DIFF    Collection Time: 09/10/23  8:10 AM    Narrative    The following orders were created for panel order CBC/DIFF.  Procedure                               Abnormality         Status                     ---------                               -----------         ------  CBC WITH IPQQ[[id number]]                                                                 Please view results for these tests on the individual orders.           PHYSICAL EXAMINATION:     Vital Signs:    Vitals:    09/07/23 1423   Weight: 78.9 kg (174 lb)   Height: 1.651 m (5' 5)   BMI: 28.96   T-98  HR-71  B/P-151/67  O2-97 RA             Physical Exam  Vitals reviewed.   Constitutional:       Appearance: Normal appearance.   HENT:      Head: Normocephalic and atraumatic.      Nose: Nose normal.      Mouth/Throat:      Mouth: Mucous membranes are moist.   Eyes:      Conjunctiva/sclera: Conjunctivae normal.   Cardiovascular:      Rate and Rhythm: Normal rate and regular rhythm.      Pulses: Normal pulses.       Heart sounds: Normal heart sounds. No murmur heard.     No friction rub. No gallop.   Pulmonary:      Effort: Pulmonary effort is normal.      Breath sounds: Normal breath sounds. No wheezing, rhonchi or rales.   Abdominal:      General: Bowel sounds are normal.      Palpations: Abdomen is soft.   Musculoskeletal:         General: Normal range of motion.      Cervical back: Neck supple. No tenderness.   Lymphadenopathy:      Cervical: No cervical adenopathy.   Skin:     General: Skin is warm and dry.      Capillary Refill: Capillary refill takes less than 2 seconds.      Findings: No erythema, lesion or rash.   Neurological:      General: No focal deficit present.      Mental Status: She is alert and oriented to person, place, and time.   Psychiatric:         Mood and Affect: Mood normal.         Behavior: Behavior normal.          ASSESSMENT AND PLAN:  Vitreous hemorrhage left eye     Scheduled for surgery with Dr. Lewayne.     / Suzen Crawley, FNP-C  09/10/2023 08:13

## 2023-09-15 MED ORDER — BALANCED SALT SOLUTION COMBINATION NO.1 INTRAOCULAR IRRIGATION
500.0000 mL | Freq: Once | INTRAOCULAR | Status: DC
Start: 2023-09-16 — End: 2023-09-16
  Filled 2023-09-15: qty 500

## 2023-09-16 ENCOUNTER — Ambulatory Visit (HOSPITAL_BASED_OUTPATIENT_CLINIC_OR_DEPARTMENT_OTHER)

## 2023-09-16 ENCOUNTER — Ambulatory Visit
Admission: RE | Admit: 2023-09-16 | Discharge: 2023-09-16 | Disposition: A | Discharge status: Home / Self Care | Source: Ambulatory Visit | Attending: Surgery | Admitting: Surgery

## 2023-09-16 ENCOUNTER — Encounter (HOSPITAL_BASED_OUTPATIENT_CLINIC_OR_DEPARTMENT_OTHER)
Admission: RE | Disposition: A | Discharge status: Home / Self Care | Payer: Self-pay | Source: Ambulatory Visit | Attending: Surgery

## 2023-09-16 ENCOUNTER — Ambulatory Visit (HOSPITAL_BASED_OUTPATIENT_CLINIC_OR_DEPARTMENT_OTHER): Admitting: Surgery

## 2023-09-16 ENCOUNTER — Other Ambulatory Visit: Payer: Self-pay

## 2023-09-16 DIAGNOSIS — H4312 Vitreous hemorrhage, left eye: Secondary | ICD-10-CM | POA: Insufficient documentation

## 2023-09-16 DIAGNOSIS — E1151 Type 2 diabetes mellitus with diabetic peripheral angiopathy without gangrene: Secondary | ICD-10-CM | POA: Insufficient documentation

## 2023-09-16 DIAGNOSIS — I10 Essential (primary) hypertension: Secondary | ICD-10-CM | POA: Insufficient documentation

## 2023-09-16 DIAGNOSIS — I252 Old myocardial infarction: Secondary | ICD-10-CM | POA: Insufficient documentation

## 2023-09-16 DIAGNOSIS — I2510 Atherosclerotic heart disease of native coronary artery without angina pectoris: Secondary | ICD-10-CM | POA: Insufficient documentation

## 2023-09-16 DIAGNOSIS — M1990 Unspecified osteoarthritis, unspecified site: Secondary | ICD-10-CM | POA: Insufficient documentation

## 2023-09-16 DIAGNOSIS — G4733 Obstructive sleep apnea (adult) (pediatric): Secondary | ICD-10-CM | POA: Insufficient documentation

## 2023-09-16 DIAGNOSIS — I219 Acute myocardial infarction, unspecified: Secondary | ICD-10-CM | POA: Insufficient documentation

## 2023-09-16 DIAGNOSIS — E785 Hyperlipidemia, unspecified: Secondary | ICD-10-CM | POA: Insufficient documentation

## 2023-09-16 DIAGNOSIS — Z794 Long term (current) use of insulin: Secondary | ICD-10-CM | POA: Insufficient documentation

## 2023-09-16 DIAGNOSIS — Z7984 Long term (current) use of oral hypoglycemic drugs: Secondary | ICD-10-CM | POA: Insufficient documentation

## 2023-09-16 DIAGNOSIS — Z8619 Personal history of other infectious and parasitic diseases: Secondary | ICD-10-CM | POA: Insufficient documentation

## 2023-09-16 DIAGNOSIS — K219 Gastro-esophageal reflux disease without esophagitis: Secondary | ICD-10-CM | POA: Insufficient documentation

## 2023-09-16 DIAGNOSIS — E113592 Type 2 diabetes mellitus with proliferative diabetic retinopathy without macular edema, left eye: Secondary | ICD-10-CM | POA: Insufficient documentation

## 2023-09-16 DIAGNOSIS — Z955 Presence of coronary angioplasty implant and graft: Secondary | ICD-10-CM | POA: Insufficient documentation

## 2023-09-16 HISTORY — DX: Rash and other nonspecific skin eruption: R21

## 2023-09-16 HISTORY — DX: Zoster without complications: B02.9

## 2023-09-16 HISTORY — PX: HX VITRECTOMY: SHX106

## 2023-09-16 HISTORY — DX: Type 2 diabetes mellitus without complications: E11.9

## 2023-09-16 LAB — POC BLOOD GLUCOSE (RESULTS)
GLUCOSE, POC: 119 mg/dL — ABNORMAL HIGH (ref 70–100)
GLUCOSE, POC: 120 mg/dL — ABNORMAL HIGH (ref 70–100)

## 2023-09-16 SURGERY — VITRECTOMY POSTERIOR WITH MEMBRANE PEEL
Anesthesia: General | Site: Eye | Laterality: Left | Wound class: Clean Wound: Uninfected operative wounds in which no inflammation occurred

## 2023-09-16 MED ORDER — PREDNISOLONE ACETATE 1 % EYE DROPS,SUSPENSION
Freq: Once | OPHTHALMIC | Status: DC | PRN
Start: 2023-09-16 — End: 2023-09-16
  Administered 2023-09-16: 1 [drp] via OPHTHALMIC

## 2023-09-16 MED ORDER — APREPITANT 40 MG CAPSULE
40.0000 mg | ORAL_CAPSULE | Freq: Once | ORAL | Status: AC
Start: 2023-09-16 — End: 2023-09-16
  Administered 2023-09-16: 40 mg via ORAL

## 2023-09-16 MED ORDER — DEXAMETHASONE SODIUM PHOSPHATE 4 MG/ML INJECTION SOLUTION
Freq: Once | INTRAMUSCULAR | Status: DC | PRN
Start: 2023-09-16 — End: 2023-09-16
  Administered 2023-09-16: 4 mg via INTRAVENOUS

## 2023-09-16 MED ORDER — ONDANSETRON HCL (PF) 4 MG/2 ML INJECTION SOLUTION
4.0000 mg | Freq: Once | INTRAMUSCULAR | Status: DC | PRN
Start: 2023-09-16 — End: 2023-09-16

## 2023-09-16 MED ORDER — FAMOTIDINE 20 MG TABLET
ORAL_TABLET | ORAL | Status: AC
Start: 2023-09-16 — End: 2023-09-16
  Filled 2023-09-16: qty 1

## 2023-09-16 MED ORDER — PROCHLORPERAZINE EDISYLATE 10 MG/2 ML (5 MG/ML) INJECTION SOLUTION
5.0000 mg | Freq: Once | INTRAMUSCULAR | Status: DC | PRN
Start: 2023-09-16 — End: 2023-09-16

## 2023-09-16 MED ORDER — HYDROMORPHONE 0.5 MG/0.5 ML INJECTION SYRINGE
0.2500 mg | INJECTION | INTRAMUSCULAR | Status: DC | PRN
Start: 2023-09-16 — End: 2023-09-16

## 2023-09-16 MED ORDER — POVIDONE-IODINE 5 % EYE SOLUTION
Freq: Once | OPHTHALMIC | Status: DC | PRN
Start: 2023-09-16 — End: 2023-09-16
  Administered 2023-09-16: 10 mL via OPHTHALMIC

## 2023-09-16 MED ORDER — POVIDONE-IODINE 10 % TOPICAL SWAB
Freq: Once | CUTANEOUS | Status: DC
Start: 2023-09-16 — End: 2023-09-16

## 2023-09-16 MED ORDER — SODIUM CHLORIDE 0.9 % (FLUSH) INJECTION SYRINGE
3.0000 mL | INJECTION | Freq: Three times a day (TID) | INTRAMUSCULAR | Status: DC
Start: 2023-09-16 — End: 2023-09-16

## 2023-09-16 MED ORDER — HYDROMORPHONE 0.5 MG/0.5 ML INJECTION SYRINGE
0.5000 mg | INJECTION | INTRAMUSCULAR | Status: DC | PRN
Start: 2023-09-16 — End: 2023-09-16

## 2023-09-16 MED ORDER — ACETAMINOPHEN 500 MG TABLET
1000.0000 mg | ORAL_TABLET | Freq: Once | ORAL | Status: AC
Start: 2023-09-16 — End: 2023-09-16
  Administered 2023-09-16: 1000 mg via ORAL

## 2023-09-16 MED ORDER — LACTATED RINGERS INTRAVENOUS SOLUTION
INTRAVENOUS | Status: DC
Start: 2023-09-16 — End: 2023-09-16

## 2023-09-16 MED ORDER — LACTATED RINGERS INTRAVENOUS SOLUTION
INTRAVENOUS | Status: DC | PRN
Start: 2023-09-16 — End: 2023-09-16
  Administered 2023-09-16: 0 via INTRAVENOUS

## 2023-09-16 MED ORDER — CYCLOPENTOLATE 1 %-TROPICAM 1 %-PHENYLEPHRINE 2.5 % IN WATER EYE DROPS
OPHTHALMIC | Status: AC
Start: 2023-09-16 — End: 2023-09-16
  Filled 2023-09-16: qty 1

## 2023-09-16 MED ORDER — IPRATROPIUM 0.5 MG-ALBUTEROL 3 MG (2.5 MG BASE)/3 ML NEBULIZATION SOLN
3.0000 mL | INHALATION_SOLUTION | Freq: Once | RESPIRATORY_TRACT | Status: DC | PRN
Start: 2023-09-16 — End: 2023-09-16

## 2023-09-16 MED ORDER — ONDANSETRON 4 MG DISINTEGRATING TABLET
ORAL_TABLET | ORAL | Status: AC
Start: 2023-09-16 — End: 2023-09-16
  Filled 2023-09-16: qty 1

## 2023-09-16 MED ORDER — FENTANYL (PF) 50 MCG/ML INJECTION SOLUTION
INTRAMUSCULAR | Status: AC
Start: 2023-09-16 — End: 2023-09-16
  Filled 2023-09-16: qty 2

## 2023-09-16 MED ORDER — CEFAZOLIN 100 MG/1 ML OPHTH INJ.
INJECTION | Freq: Once | Status: DC | PRN
Start: 2023-09-16 — End: 2023-09-16
  Administered 2023-09-16: .5 mL via INTRAVITREAL

## 2023-09-16 MED ORDER — ONDANSETRON HCL (PF) 4 MG/2 ML INJECTION SOLUTION
Freq: Once | INTRAMUSCULAR | Status: DC | PRN
Start: 2023-09-16 — End: 2023-09-16
  Administered 2023-09-16: 4 mg via INTRAVENOUS

## 2023-09-16 MED ORDER — ONDANSETRON 4 MG DISINTEGRATING TABLET
4.0000 mg | ORAL_TABLET | Freq: Once | ORAL | Status: AC
Start: 2023-09-16 — End: 2023-09-16
  Administered 2023-09-16: 4 mg via ORAL

## 2023-09-16 MED ORDER — MEPERIDINE (PF) 25 MG/ML INJECTION SYRINGE
12.5000 mg | INJECTION | Freq: Once | INTRAMUSCULAR | Status: DC | PRN
Start: 2023-09-16 — End: 2023-09-16

## 2023-09-16 MED ORDER — DEXAMETHASONE SODIUM PHOSPHATE 4 MG/ML INJECTION SOLUTION
Freq: Once | INTRAMUSCULAR | Status: DC | PRN
Start: 2023-09-16 — End: 2023-09-16
  Administered 2023-09-16: 2 mg via INTRAMUSCULAR

## 2023-09-16 MED ORDER — INDOCYANINE GREEN 25 MG SOLUTION FOR INJECTION
Freq: Once | INTRAMUSCULAR | Status: DC | PRN
Start: 2023-09-16 — End: 2023-09-16
  Administered 2023-09-16: .5 mL via INTRAVITREAL

## 2023-09-16 MED ORDER — HYPROMELLOSE 2.5 % EYE DROPS
Freq: Once | OPHTHALMIC | Status: DC | PRN
Start: 2023-09-16 — End: 2023-09-16

## 2023-09-16 MED ORDER — ACETAMINOPHEN 500 MG TABLET
ORAL_TABLET | ORAL | Status: AC
Start: 2023-09-16 — End: 2023-09-16
  Filled 2023-09-16: qty 2

## 2023-09-16 MED ORDER — BACITRACIN-POLYMYXIN B 500 UNIT-10,000 UNIT/GRAM EYE OINTMENT
TOPICAL_OINTMENT | Freq: Once | OPHTHALMIC | Status: DC | PRN
Start: 2023-09-16 — End: 2023-09-16
  Administered 2023-09-16: 1 via OPHTHALMIC

## 2023-09-16 MED ORDER — ATROPINE 1 % EYE DROPS
Freq: Once | OPHTHALMIC | Status: DC | PRN
Start: 2023-09-16 — End: 2023-09-16
  Administered 2023-09-16: 1 [drp] via OPHTHALMIC

## 2023-09-16 MED ORDER — OFLOXACIN 0.3 % EYE DROPS
1.0000 [drp] | Freq: Once | OPHTHALMIC | Status: DC
Start: 2023-09-16 — End: 2023-09-16

## 2023-09-16 MED ORDER — CYCLOPENTOLATE 1 %-TROPICAM 1 %-PHENYLEPHRINE 2.5 % IN WATER EYE DROPS
2.0000 [drp] | OPHTHALMIC | Status: DC
Start: 2023-09-16 — End: 2023-09-16

## 2023-09-16 MED ORDER — FAMOTIDINE 20 MG TABLET
20.0000 mg | ORAL_TABLET | Freq: Once | ORAL | Status: AC
Start: 2023-09-16 — End: 2023-09-16
  Administered 2023-09-16: 20 mg via ORAL

## 2023-09-16 MED ORDER — LIDOCAINE HCL 20 MG/ML (2 %) INJECTION SOLUTION
Freq: Once | INTRAMUSCULAR | Status: DC | PRN
Start: 2023-09-16 — End: 2023-09-16
  Administered 2023-09-16: 100 mg via INTRAVENOUS

## 2023-09-16 MED ORDER — APREPITANT 40 MG CAPSULE
ORAL_CAPSULE | ORAL | Status: AC
Start: 2023-09-16 — End: 2023-09-16
  Filled 2023-09-16: qty 1

## 2023-09-16 MED ORDER — GLYCOPYRROLATE 0.2 MG/ML INJECTION SOLUTION
Freq: Once | INTRAMUSCULAR | Status: DC | PRN
Start: 2023-09-16 — End: 2023-09-16
  Administered 2023-09-16: .2 mg via INTRAVENOUS

## 2023-09-16 MED ORDER — REMIFENTANIL 50 MCG/ML INFUSION - FOR ANES
INTRAVENOUS | Status: DC | PRN
Start: 2023-09-16 — End: 2023-09-16
  Administered 2023-09-16: .1 ug/kg/min via INTRAVENOUS
  Administered 2023-09-16: 0 ug/kg/min via INTRAVENOUS

## 2023-09-16 MED ORDER — BALANCED SALT SOLUTION COMBINATION NO.1 INTRAOCULAR IRRIGATION
Freq: Once | INTRAOCULAR | Status: DC | PRN
Start: 2023-09-16 — End: 2023-09-16
  Administered 2023-09-16: 500 mL via OPHTHALMIC

## 2023-09-16 MED ORDER — ROCURONIUM 10 MG/ML INTRAVENOUS SOLUTION
Freq: Once | INTRAVENOUS | Status: DC | PRN
Start: 2023-09-16 — End: 2023-09-16
  Administered 2023-09-16: 30 mg via INTRAVENOUS

## 2023-09-16 MED ORDER — ALBUTEROL SULFATE 2.5 MG/3 ML (0.083 %) SOLUTION FOR NEBULIZATION
2.5000 mg | INHALATION_SOLUTION | Freq: Once | RESPIRATORY_TRACT | Status: DC | PRN
Start: 2023-09-16 — End: 2023-09-16

## 2023-09-16 MED ORDER — PROPOFOL 10 MG/ML IV BOLUS
INJECTION | Freq: Once | INTRAVENOUS | Status: DC | PRN
Start: 2023-09-16 — End: 2023-09-16
  Administered 2023-09-16: 150 mg via INTRAVENOUS

## 2023-09-16 MED ORDER — SODIUM CHLORIDE 0.9 % (FLUSH) INJECTION SYRINGE
3.0000 mL | INJECTION | INTRAMUSCULAR | Status: DC | PRN
Start: 2023-09-16 — End: 2023-09-16

## 2023-09-16 MED ORDER — SUGAMMADEX 100 MG/ML INTRAVENOUS SOLUTION
Freq: Once | INTRAVENOUS | Status: DC | PRN
Start: 2023-09-16 — End: 2023-09-16
  Administered 2023-09-16: 200 mg via INTRAVENOUS

## 2023-09-16 SURGICAL SUPPLY — 23 items
BLADE OPTH BLK BEAVER MN BLD 60D BVL UP GRINDLESS SHARP ALL ARND (OPHTHALMIC SUPPLIES (NOT LENS)) ×1 IMPLANT
CANNULA OPTH 32MM 25GA FLXTP .9MM TW SIL RIGID BLUNT LL HUB DISP (MED SURG SUPPLIES) ×1 IMPLANT
CANNULA SUCT/IRRG FLX-TIP 1MM 25GA .5MM 32MM SIL FLXB LL HUB DISP (MED SURG SUPPLIES) ×1 IMPLANT
CAUTERY ESURG DULL GRY 1250C LOW TEMP ALK BTRY CAP CVR FN TIP LF (SURGICAL CUTTING SUPPLIES) ×1 IMPLANT
DISCONTINUED USE ITEM 48075 - SYRINGE MONOJECT 60ML LF  STRL LL TIP GRAD MED POLYPROP STD DISP (MED SURG SUPPLIES) ×1 IMPLANT
DRAPE ADH INCS FILM 51X51IN MED STRDRP LF  STRL DISP SURG PLASTIC 5X4IN CLR (DRAPE/PACKS/SHEETS/OR TOWEL) ×1 IMPLANT
FILTER TRNSF GAM STRL STRL .22UM MLX GV 33MM PVDF ACRL YW (IV TUBING & ACCESSORIES) ×1 IMPLANT
GLOVE SURG 7.5 LF  PF BEAD CUF STRL CRM 11.8IN PROTEXIS PI PLISPRN THK9.1 MIL (GLOVES AND ACCESSORIES) ×1 IMPLANT
GOWN SURG XL L2 NONREINFORCE HKLP CLSR LTWT STRL LF  DISP BLU ECLIPSE SMS 47IN (DRAPE/PACKS/SHEETS/OR TOWEL) ×1 IMPLANT
HANDLE PHACO STELLARIS EXT VITRECT (MED SURG SUPPLIES) ×1 IMPLANT
PACK OPTH 23GA STELLARIS ELT BI-BLADE POST MID FLD (OPHTHALMIC SUPPLIES (NOT LENS)) IMPLANT
PACK PROCEDURE SURG OPTH MDCTD STRL LF DISP OR EYE (CUSTOM TRAYS & PACK) ×1 IMPLANT
PACK SURG STELLARIS ELT POST 27GA (CUSTOM TRAYS & PACK) IMPLANT
PACK VITRECT 25GA STELLARIS ELT POST 2 BLADE CTR MID FLD STRL LF DISP (OPHTHALMIC SUPPLIES (NOT LENS)) ×1 IMPLANT
PAD EYE 2 1/8INX2 5/8IN LRG OVAL CONTOUR CURITY COTTON ABS LF  STRL DISP (WOUND CARE SUPPLY) ×3 IMPLANT
PROBE LASER 23GA EPRB THB ADJ INTUITIVE STRL LF DISP (SURGICAL LASER SUPPLIES) IMPLANT
PROBE LASER 25GA EPRB VITREORETINA HANDPC THB ADJ FULL CVRGLS (SURGICAL LASER SUPPLIES) ×1 IMPLANT
SOL IRRG BSS PLAIN 15ML STRL L (MED SURG SUPPLIES) ×1 IMPLANT
SUTURE 2-0 ETHIBOND EXC SUTUPK 18IN GRN BRD TIE 12 STRN PCUT NONAB (SUTURE/WOUND CLOSURE) ×1 IMPLANT
SUTURE 7-0 TG140-8 VICRYL MICROP 18IN VIOL 2 ARM BRD COAT ABS (SUTURE/WOUND CLOSURE) IMPLANT
SUTURE PLAIN 6-0 G1 MICROP 18IN TAN 2 ARM MONOF ABS (SUTURE/WOUND CLOSURE) ×1 IMPLANT
TAPE TRANSPORE 1INX1 1/2YD_1527S1 500RL/CS 100/BX (WOUND CARE SUPPLY) ×1 IMPLANT
TOWEL 27X17IN COTTON ABS PREWASH DELINT GRN DISP SURG STRL LF (DRAPE/PACKS/SHEETS/OR TOWEL) ×2 IMPLANT

## 2023-09-16 NOTE — OR PreOp (Signed)
All eye drops given to left eye as ordered.

## 2023-09-16 NOTE — H&P (Signed)
 H&P UPDATE FORM                                                                                  Melissa Wolf, 83 y.o. female  Date of Admission:  09/16/2023  Date of Birth:  April 17, 1940    09/16/2023    STOP: IF H&P IS GREATER THAN 30 DAYS FROM SURGICAL DAY COMPLETE NEW H&P IS REQUIRED.     H & P updated the day of the procedure.  1.  H&P completed within 30 days of surgical procedure and has been reviewed within 24 hours of admission but prior to surgery or a procedure requiring anesthesia services, the patient has been examined, and no change has occured in the patients condition since the H&P was completed.       Change in medications: No        No LMP recorded. Patient is postmenopausal.      Comments:     2.  Patient continues to be appropriate candidate for planned surgical procedure. YES    Lamar JULIANNA Levander DOUGLAS, MD

## 2023-09-16 NOTE — Discharge Instructions (Addendum)
 SURGICAL DISCHARGE INSTRUCTIONS     Dr. Levander, Lamar FALCON III, MD  performed your 25g VITRECTOMY POSTERIOR WITH MEMBRANE PEEL today at the [facility]:  Monday through Friday from 8 a.m. - 4 p.m.: [phone]   Between 4 p.m. - 8 a.m., weekends and holidays:  Call ER [phone]    PLEASE SEE WRITTEN HANDOUTS AS DISCUSSED BY YOUR NURSE:  [facility] Discharge Instruction sheets x3.    SIGNS AND SYMPTOMS OF A WOUND / INCISION INFECTION   Be sure to watch for the following:  Increase in redness or red streaks near or around the wound or incision.  Increase in pain that is intense or severe and cannot be relieved by the pain medication that your doctor has given you.  Increase in swelling that cannot be relieved by elevation of a body part, or by applying ice, if permitted.  Increase in drainage, or if yellow / green in color and smells bad. This could be on a dressing or a cast.  Increase in fever for longer than 24 hours, or an increase that is higher than 101 degrees Fahrenheit (normal body temperature is 98 degrees Fahrenheit). The incision may feel warm to the touch.    **CALL YOUR DOCTOR IF ONE OR MORE OF THESE SIGNS / SYMPTOMS SHOULD OCCUR.    ANESTHESIA INFORMATION   ANESTHESIA -- ADULT PATIENTS:  You have received intravenous sedation / general anesthesia, and you may feel drowsy and light-headed for several hours. You may even experience some forgetfulness of the procedure. DO NOT DRIVE A MOTOR VEHICLE or perform any activity requiring complete alertness or coordination until you feel fully awake in about 24-48 hours. Do not drink alcoholic beverages for at least 24 hours. Do not stay alone, you must have a responsible adult available to be with you. You may also experience a dry mouth or nausea for 24 hours. This is a normal side effect and will disappear as the effects of the medication wear off.    REMEMBER   If you experience any  difficulty breathing, chest pain, bleeding that you feel is excessive, persistent nausea or vomiting or for any other concerns:  Call your physician Dr.  Levander, Lamar FALCON MOULD, MD   at office . You may also ask to have the general doctor on call paged. They are available to you 24 hours a day.      SPECIAL INSTRUCTIONS / COMMENTS   Refer to [facility] Discharge Instruction sheets x3 provided.    BRING BLUE EYE KIT & EYE DROPS TO EACH APPOINTMENT.      FOLLOW-UP APPOINTMENTS   Please call your surgeon's office at the number listed to schedule a date / time of return for follow-up.     **Follow up on 09/20/23 at 8:50 AM- [place] Office**      Prednilsone 1% ophthalmic drops use 1 drop 4 times per day left eye and Ofloxacin 0.3% ophthalmic drops  use 1 drop 4 times per day left eye

## 2023-09-16 NOTE — Anesthesia Transfer of Care (Signed)
 ANESTHESIA TRANSFER OF CARE   Melissa Wolf is a 83 y.o. ,female, Weight: 78.9 kg (174 lb)   had Procedure(s):  25g VITRECTOMY POSTERIOR WITH MEMBRANE PEEL  performed  09/16/23   Primary Service: Lamar [name]*    Past Medical History:   Diagnosis Date   . Atrial ectopy    . CAD (coronary artery disease)    . Diabetes mellitus, type 2    . GERD (gastroesophageal reflux disease)    . History of coronary artery stent placement    . HTN (hypertension)    . Hyperlipidemia    . Myocardial infarction    . OA (osteoarthritis)    . OSA (obstructive sleep apnea)    . PAD (peripheral artery disease) (CMS [name])    . Rash     ARM RASH DUE TO NERVES   . Shingles    . Syncope and collapse    . Type 2 diabetes mellitus    . Wears glasses       Allergy History as of 09/16/23       CODEINE         Noted Status Severity Type Reaction    09/01/20 2003 Kennyth Shell, RN 09/01/20 Active       Comments: Pt doesn't remember exact rxn     09/01/20 2003 Kennyth Eloy, RN 09/01/20 Active                 SEMAGLUTIDE         Noted Status Severity Type Reaction    09/07/23 1420 Ashley Agent, RN 09/07/23 Active Low  Nausea/ Vomiting                  I completed my transfer of care / handoff to the receiving personnel during which we discussed:  Access, Airway, All key/critical aspects of case discussed, Analgesia, Antibiotics, Expectation of post procedure, Fluids/Product, Gave opportunity for questions and acknowledgement of understanding, Labs and PMHx  Report given to: Marlo Rogue, RN    Post Location: PACU                                                           Last OR Temp: Temperature: 36 C (96.8 F)  ABG:  POTASSIUM   Date Value Ref Range Status   09/10/2023 4.8 3.2 - 5.0 mmol/L Final   12/30/2022 4.5 3.5 - 5.1 mmol/L Final     KETONES   Date Value Ref Range Status   09/10/2023 Negative Negative, Trace mg/dL Final     CALCIUM   Date Value Ref Range Status   09/10/2023 9.7 8.3 - 10.7 mg/dL Final   89/83/7975 9.6 8.6 -  10.3 mg/dL Final     Comment:     Gadolinium-containing contrast can interfere with calcium measurement.       Airway:* No LDAs found *  Blood pressure 122/86, pulse 69, temperature 36 C (96.8 F), resp. rate (!) 10, height 1.651 m (5' 5), weight 78.9 kg (174 lb), SpO2 98%.

## 2023-09-16 NOTE — OR Surgeon (Signed)
 OPERATIVE NOTE          Patient Name: Melissa Wolf, Melissa Wolf MRN: [id number]  Date of Birth: 16-Dec-1940  Date of Service: 09/16/2023    Pre-Operative Diagnosis: Vitreous hemorrhage of left eye (CMS [facility]) [H43.12]    Post-Operative Diagnosis: Vitreous hemorrhage of left eye (CMS [name]) [H43.12]   Procedure(s)/Description:  Procedure(s):  25g VITRECTOMY POSTERIOR WITH MEMBRANE PEEL   Primary Eye Procedure: Trans [place] Plana Vitrectomyleft eye Panretinal laser Left eye  Secondary Eye Procedure: None     Attending Surgeon: Lamar JULIANNA Levander DOUGLAS, MD  Combined Cataract/Surgeon:None  Surgical Assistant: Nichole Batty LPN  Anesthesia Staff:  Anesthesiologist: Scarlett Reaper, MD  CRNA: Libby Nest  Retina Findings:Vitreous Hemorrhage  left eye and Proliferative Diabetic Retinopathy  left eye  Implant Information: * No implants in log *     Anesthesia Type:General    Estimated Blood Loss:  None  Complications:  None    Condition:  Stable     Disposition:   PACU - hemodynamically stable.     Indications:  Patient is a 83 y.o. female who presents due to decreased vision.     Description of Procedure/Operation:  Patient was brought to the operative suite was again verified that the left eye was be operated upon.  Patient was anesthetized using general endotracheal intubation and anesthesia.  When the patient was asleep the left eye was prepped and draped in normal sterile fashion.  Lid speculum placed the left lids for exposure.  25 gauge transconjunctival sclerotomy was made 3-1/2 mm posterior limbus in the inferotemporal quadrant.  Infusion canula was placed in the eye.  Light pipe was used to verify the tip of the infusion cannula in the vitreous cavity therefore was turned on.  Sclerotomies were made the supranasal and supratemporal quadrants also 3-1/2 mm posterior limbus.  Wide field non contact lens system was used in conjunction light pipe and Microvit to perform a core vitrectomy.  Vitrectomy was carried  out vitreous base for 360.  Vitreous hemorrhage was removed.  Magnifying lens was then used  to inspect the macula.  No membranes were present.  Intra-ocular cautery was used to cauterize any remaining vascular stubs.  Hemostasis was meticulous.  Indirect laser was used to place 768 spots in the mid periphery and periphery for 360.  No breaks tears or detachments were noted.  Superior trocars were removed and both sites were water tight.  Infusion cannula was removed from the eye and this site was also watertight.  Ancef and dexamethasone were given in the inferonasal quadrant subconjunctivally.  Atropine drops and ophthalmic ointment placed.  Pressure patch and shield were placed.  Patient tolerated surgery well.    Lamar JULIANNA Levander DOUGLAS, MD

## 2023-09-16 NOTE — Anesthesia Postprocedure Evaluation (Signed)
 Anesthesia Post Op Evaluation    Patient: Melissa Wolf  Procedure(s):  25g VITRECTOMY POSTERIOR WITH MEMBRANE PEEL    Last Vitals:Temperature: 36 C (96.8 F) (09/16/23 1038)  Heart Rate: 82 (09/16/23 1052)  BP (Non-Invasive): (!) 145/79 (09/16/23 1052)  Respiratory Rate: 16 (09/16/23 1052)  SpO2: 94 % (09/16/23 1052)    No notable events documented.    Anesthesia Post Evaluation

## 2023-09-16 NOTE — Anesthesia Preprocedure Evaluation (Signed)
 ANESTHESIA PRE-OP EVALUATION  Planned Procedure: 25G, VITRECTOMY POSTERIOR (Left: Eye)  VITRECTOMY POSTERIOR WITH MEMBRANE PEEL (Left: Eye)  Review of Systems     anesthesia history negative               Pulmonary   shortness of breath,   Cardiovascular    Hypertension, past MI, CAD, dysrhythmias, PVD, cardiac stents and hyperlipidemia ,       GI/Hepatic/Renal    GERD        Endo/Other    osteoarthritis and obesity,   type 2 diabetes    Neuro/Psych/MS   negative neuro/psych ROS,      Cancer                  Physical Assessment      Airway       Mallampati: II    TM distance: 3 FB    Neck ROM: full  Mouth Opening: good.            Dental           (+) upper dentures           Pulmonary    Breath sounds clear to auscultation       Cardiovascular      Rate: Normal       Other findings          Plan  ASA 3     Planned anesthesia type: general     general anesthesia with endotracheal tube intubation      PONV Plan:  I plan to administer pharmcologic prophalaxis antiemetics  POV PLAN:   plan for postoperative opioid use            Intravenous induction     Anesthesia issues/risks discussed are: Dental Injuries, Eye /Visual Loss, Post-op Intubation/Ventilation, Post-op Pain Management, Post-op Agitation/Tantrum, Stroke, Aspiration, Difficult Airway, Nerve Injuries, PONV, Post-op Cognitive Dysfunction, Cardiac Events/MI, Intraoperative Awareness/ Recall, Blood Loss and Sore Throat.  Anesthetic plan and risks discussed with patient  signed consent obtained          Patient's NPO status is appropriate for Anesthesia.

## 2023-09-21 ENCOUNTER — Encounter (INDEPENDENT_AMBULATORY_CARE_PROVIDER_SITE_OTHER): Payer: Self-pay

## 2023-09-24 ENCOUNTER — Other Ambulatory Visit (INDEPENDENT_AMBULATORY_CARE_PROVIDER_SITE_OTHER): Payer: Self-pay | Admitting: Family Medicine

## 2023-09-24 MED ORDER — ATORVASTATIN 40 MG TABLET
80.0000 mg | ORAL_TABLET | Freq: Every evening | ORAL | 3 refills | Status: AC
Start: 2023-09-24 — End: ?

## 2023-09-29 ENCOUNTER — Other Ambulatory Visit (HOSPITAL_BASED_OUTPATIENT_CLINIC_OR_DEPARTMENT_OTHER): Payer: Self-pay | Admitting: Student

## 2023-10-07 ENCOUNTER — Other Ambulatory Visit (HOSPITAL_BASED_OUTPATIENT_CLINIC_OR_DEPARTMENT_OTHER): Payer: Self-pay | Admitting: Family Medicine

## 2023-10-07 ENCOUNTER — Other Ambulatory Visit (HOSPITAL_BASED_OUTPATIENT_CLINIC_OR_DEPARTMENT_OTHER): Payer: Self-pay | Admitting: Student

## 2023-10-07 DIAGNOSIS — S069XAS Unspecified intracranial injury with loss of consciousness status unknown, sequela: Secondary | ICD-10-CM

## 2023-10-07 MED ORDER — DIAZEPAM 5 MG PO TABS: ORAL_TABLET | ORAL | 0 refills | Status: DC

## 2023-11-12 ENCOUNTER — Emergency Department (HOSPITAL_COMMUNITY)

## 2023-11-12 ENCOUNTER — Encounter (HOSPITAL_COMMUNITY): Payer: Self-pay

## 2023-11-12 ENCOUNTER — Emergency Department
Admission: EM | Admit: 2023-11-12 | Discharge: 2023-11-12 | Disposition: A | Discharge status: Home / Self Care | Attending: Family Medicine | Admitting: Family Medicine

## 2023-11-12 ENCOUNTER — Other Ambulatory Visit: Payer: Self-pay

## 2023-11-12 DIAGNOSIS — S7012XA Contusion of left thigh, initial encounter: Secondary | ICD-10-CM | POA: Insufficient documentation

## 2023-11-12 DIAGNOSIS — Y92010 Kitchen of single-family (private) house as the place of occurrence of the external cause: Secondary | ICD-10-CM | POA: Insufficient documentation

## 2023-11-12 DIAGNOSIS — S7002XA Contusion of left hip, initial encounter: Secondary | ICD-10-CM | POA: Insufficient documentation

## 2023-11-12 DIAGNOSIS — W19XXXA Unspecified fall, initial encounter: Secondary | ICD-10-CM | POA: Insufficient documentation

## 2023-11-12 MED ORDER — TRAMADOL 50 MG TABLET
1.0000 | ORAL_TABLET | Freq: Four times a day (QID) | ORAL | 0 refills | Status: AC | PRN
Start: 2023-11-12 — End: ?

## 2023-11-12 MED ORDER — IBUPROFEN 200 MG TABLET
400.0000 mg | ORAL_TABLET | Freq: Four times a day (QID) | ORAL | Status: AC | PRN
Start: 2023-11-12 — End: 2023-11-19

## 2023-11-12 MED ORDER — TRAMADOL 50 MG TABLET
50.0000 mg | ORAL_TABLET | ORAL | Status: AC
Start: 2023-11-12 — End: 2023-11-12
  Administered 2023-11-12: 50 mg via ORAL
  Filled 2023-11-12: qty 1

## 2023-11-12 NOTE — Discharge Instructions (Signed)
 You were seen in the ER for evaluation of left hip pain from a fall a week ago.  X-rays and CT scan were negative for any broken bones.  I suspect this is a contusion and have sent you home with a mild anti-inflammatory.  You can take the anti-inflammatory and Tylenol  as needed for mild-to-moderate pain.  I have also given you a low dose of tramadol  to take as needed for severe pain.  Monitor symptoms and return emergency department as needed for severe uncontrolled pain.  I would like you to try to use your walker to ambulate until your pain improves to help reduce the risk of falls.

## 2023-11-12 NOTE — ED Provider Notes (Signed)
 [facility]  Emergency Department  11/12/2023     Melissa Wolf  06/23/1940  83 y.o.  female  Melissa Wolf [id number]   [phone] (home)  PCP: Evalene JONELLE Gelineau, DO   Date of service:11/12/2023 17:39    Chief Complaint:   Chief Complaint   Patient presents with    Fall     Pt states she fell in her kitchen 6 or seven days ago. Pt states the following day she began having pain in her left hip. Pt states she is now having difficulty standing and putting weight on her left leg.             HPI: This is a 83 y.o. female who presents to the emergency department complaining of  left hip pain status post fall    Patient here with her daughter and tells me that she fell proximally 1 week ago in her kitchen, she states she slipped and landed on her left side and was unable to get up, she would contact your family and when the family got there they were able to get her up to the chair, she was initially able to weightbear with some left-sided pain, her daughter tells me that for few days after that she was able to walk with a limp but for the last 3 days she has been unable to put any weight on the left lower extremity, patient denies any other injuries, she has a history of CAD with multiple stents in the past but had surgical clearance per her cardiologist earlier this year for cataract surgery          Past Medical History:   Past Medical History:   Diagnosis Date    Atrial ectopy     CAD (coronary artery disease)     Diabetes mellitus, type 2     GERD (gastroesophageal reflux disease)     History of coronary artery stent placement     HTN (hypertension)     Hyperlipidemia     Myocardial infarction     OA (osteoarthritis)     OSA (obstructive sleep apnea)     PAD (peripheral artery disease) (CMS [name])     Rash     ARM RASH DUE TO NERVES    Shingles     Syncope and collapse     Type 2 diabetes mellitus     Wears glasses      (Not in an outpatient encounter)     Past Surgical History:   Past  Surgical History:   Procedure Laterality Date    Cataract extraction w/ intraocular lens implant Bilateral     Hand surgery      Hx coronary stent placement      Hx hysterectomy      Hx tonsillectomy      Hx vitrectomy Left 09/16/2023       Social History: Social History[1]     Family History:  Family History   Problem Relation Age of Onset    Coronary Artery Disease Sister     Heart Attack Sister      Oncology History    No problem history exists.        Medications Prior to Admission       Prescriptions    aspirin (ECOTRIN) 81 mg Oral Tablet, Delayed Release (E.C.)    Take 1 Tablet (81 mg total) by mouth Daily    atorvastatin  (LIPITOR) 40 mg Oral Tablet    Take 2  Tablets (80 mg total) by mouth Every night    clopidogreL  (PLAVIX ) 75 mg Oral Tablet    Take 1 Tablet (75 mg total) by mouth Daily    ergocalciferol, vitamin D2, (DRISDOL) 1,250 mcg (50,000 unit) Oral Capsule    Take 1 Capsule (50,000 Units total) by mouth Every 7 days    gabapentin  (NEURONTIN ) 100 mg Oral Capsule    Take 1 Capsule (100 mg total) by mouth Three times a day for 5 days    insulin glargine 100 unit/mL Subcutaneous injection (vial)    Inject 35 Units under the skin Twice daily - in morning and at bedtime As directed  20UNIT NIGHTLY    isosorbide  mononitrate (IMDUR ) 60 mg Oral Tablet Sustained Release 24 hr    Take 1 Tablet (60 mg total) by mouth Every morning    metFORMIN (GLUCOPHAGE) 500 mg Oral Tablet    Take 1 Tablet (500 mg total) by mouth Twice daily with food    metoprolol  succinate (TOPROL -XL) 25 mg Oral Tablet Sustained Release 24 hr    Take 1 Tablet (25 mg total) by mouth Daily    nitroGLYCERIN  (NITROSTAT ) 0.4 mg Sublingual Tablet, Sublingual    Place 1 Tablet (0.4 mg total) under the tongue Every 5 minutes as needed for Chest pain for 3 doses over 15 minutes    NOVOLOG U-100 INSULIN ASPART SUBQ    Inject 1 Units under the skin Once a day SLIDING SCALE    sertraline (ZOLOFT) 25 mg Oral Tablet    Take 1 Tablet (25 mg total) by mouth  Daily    syringe-needle,insulin,0.5 mL (INSULIN SYRINGE N/A)            Allergies: Allergies[2]    Above history reviewed with patient.  Allergies, medication list, reviewed.       Review of Systems - All other systems reviewed and are negative unless noted in HPI.      Physical exam:  Body mass index is 29.95 kg/m.  ED Triage Vitals [11/12/23 1722]   BP (Non-Invasive) 130/76   Heart Rate 69   Respiratory Rate 16   Temperature 36.6 C (97.9 F)   SpO2 99 %   Weight 81.6 kg (180 lb)   Height 1.651 m (5' 5)       Physical Exam  Vitals and nursing note reviewed.   Constitutional:       Appearance: Normal appearance.   HENT:      Head: Normocephalic and atraumatic.   Cardiovascular:      Rate and Rhythm: Normal rate and regular rhythm.      Pulses: Normal pulses.      Heart sounds: Normal heart sounds.   Pulmonary:      Effort: Pulmonary effort is normal.      Breath sounds: Normal breath sounds.   Musculoskeletal:         General: Tenderness (moderate greater trochanter of the left hip no obvious deformity) present. No swelling or deformity.      Cervical back: Normal range of motion and neck supple.   Neurological:      Mental Status: She is alert.   Psychiatric:         Mood and Affect: Mood normal.         Behavior: Behavior normal.         Thought Content: Thought content normal.         Judgment: Judgment normal.          The following  orders were placed:  Orders Placed This Encounter    * XR HIP LEFT W PELVIS 2-3 VIEWS    * XR FEMUR LEFT    CT PELVIS WO IV CONTRAST    Ibuprofen  (MOTRIN ) 200 mg Oral Tablet    traMADoL  (ULTRAM ) 50 mg Oral Tablet    traMADol  (ULTRAM ) tablet      CT PELVIS WO IV CONTRAST   Final Result   No evidence for acute bony fracture. Osteopenia and degenerative changes as described.               The CT exam was performed using one or more of the following dose reduction techniques: Automated exposure control, adjustment of the mA and/or kV according to the patient's size, or use of  iterative reconstruction technique.               Radiologist location ID: [id number]         * XR FEMUR LEFT   Final Result   Osteopenia. No definite acute fracture of the femur. Degenerative changes at the knee joint.                        Radiologist location ID: [id number]         * XR HIP LEFT W PELVIS 2-3 VIEWS   Final Result   Osteopenia and degenerative changes without definite evidence for acute bony injury.                        Radiologist location ID: [id number]            Labs Ordered/Reviewed - No data to display   No results found for this or any previous visit (from the past 12 hours).      ED Course:     ED Course as of 11/12/23 1858   Fri Nov 12, 2023   1826 * XR FEMUR LEFT  No apparent fracture of hip or femur on xray   1841 CT PELVIS WO IV CONTRAST  IMPRESSION:  No evidence for acute bony fracture. Osteopenia and degenerative changes as described.        Medications Ordered/Administered in the ED   traMADol  (ULTRAM ) tablet (has no administration in time range)        Emergency Department Procedure:    EKG Interpertation:    Procedures    ED events:                       Clinical Impression:   Clinical Impression   Accidental fall, initial encounter (Primary)   Contusion of left hip and thigh, initial encounter       Disposition: Discharged        Medical Decision Making:    Ddx (including but not limited to):  Sprain strain contusion fracture dislocation internal injury    Data (all images independently viewed by me): nurses notes and vital signs reviewed    Labs:  None    Imaging:  X-ray of the hip and femur with out evidence for fracture    CT of the pelvis and left hip with no evidence for fracture    Records: n/a    Consult/Discussion:  Patient with reports of pain after falling a week ago.  No evidence of fracture at this time.  We will be given Tylenol  or Motrin  for mild-to-moderate pain and tramadol  to use as needed for severe pain.  Patient  lives with family has a walker to use at  home.  At this time she appears to be stable for discharge home    Plan:  Discharge home      Findings and diagnosis discussed with patient/family who is/are agreeable with plan            No follow-ups on file.   New Prescriptions    IBUPROFEN  (MOTRIN ) 200 MG ORAL TABLET    Take 2 Tablets (400 mg total) by mouth Four times a day as needed for Pain for up to 7 days    TRAMADOL  (ULTRAM ) 50 MG ORAL TABLET    Take 1 Tablet (50 mg total) by mouth Every 6 hours as needed for Pain      [facility] - Emergency Department  [address]  [place]  [address]  [phone]    As needed    Vonzell Evalene SAUNDERS, DO  [facility]  PHYSICIAN CLINIC  [address]  [place] [id number]  [phone]      As needed      Future Appointments  Future Appointments   Date Time Provider Department Center   08/25/2024  9:30 AM Provider, Cardiology [facility]        BP 130/76   Pulse 69   Temp 36.6 C (97.9 F)   Resp 16   Ht 1.651 m (5' 5)   Wt 81.6 kg (180 lb)   SpO2 99%   BMI 29.95 kg/m        Yoni Lobos W. Jackquline DO 11/12/2023              This note was partially created using voice recognition software and is inherently subject to errors including those of syntax and sound alike  substitutions which may escape proof reading.  In such instances, original meaning may be extrapolated by contextual derivation.       [1]   Social History  Tobacco Use    Smoking status: Never    Smokeless tobacco: Never   Vaping Use    Vaping status: Never Used   Substance Use Topics    Alcohol use: Not Currently    Drug use: Never   [2]   Allergies  Allergen Reactions    Codeine      Pt doesn't remember exact rxn    Ozempic [Semaglutide] Nausea/ Vomiting

## 2023-11-12 NOTE — ED Nurses Note (Signed)
 Patient discharged from ED. Patient is at baseline mentation and VSS/baseline. AVS reviewed with patient/care giver. A written copy of the AVS and discharge instructions was given to the patient/care giver. Patient/care giver instructed to pick up any prescribed medication from preferred pharmacy as soon as possible. Questions sufficiently answered as needed. Patient/care giver encouraged to follow up with PCP as indicated. In the event of an emergency, patient/care giver instructed to call 911 or go to the nearest emergency room. Patient assisted out of ED via wheelchair.

## 2023-11-22 ENCOUNTER — Ambulatory Visit (INDEPENDENT_AMBULATORY_CARE_PROVIDER_SITE_OTHER): Admitting: Student

## 2023-11-22 ENCOUNTER — Encounter (HOSPITAL_BASED_OUTPATIENT_CLINIC_OR_DEPARTMENT_OTHER): Payer: Self-pay | Admitting: Student

## 2023-11-22 VITALS — BP 136/67 | HR 59 | Temp 98.5°F | Resp 16 | Ht 63.0 in | Wt 183.6 lb

## 2023-11-22 DIAGNOSIS — N1831 Chronic kidney disease, stage 3a: Secondary | ICD-10-CM | POA: Diagnosis not present

## 2023-11-22 DIAGNOSIS — D6859 Other primary thrombophilia: Secondary | ICD-10-CM | POA: Diagnosis not present

## 2023-11-22 DIAGNOSIS — D500 Iron deficiency anemia secondary to blood loss (chronic): Secondary | ICD-10-CM

## 2023-11-22 DIAGNOSIS — E782 Mixed hyperlipidemia: Secondary | ICD-10-CM | POA: Diagnosis not present

## 2023-11-22 DIAGNOSIS — R42 Dizziness and giddiness: Secondary | ICD-10-CM

## 2023-11-22 DIAGNOSIS — Z131 Encounter for screening for diabetes mellitus: Secondary | ICD-10-CM | POA: Diagnosis not present

## 2023-11-22 DIAGNOSIS — I480 Paroxysmal atrial fibrillation: Secondary | ICD-10-CM

## 2023-11-22 DIAGNOSIS — S069XAS Unspecified intracranial injury with loss of consciousness status unknown, sequela: Secondary | ICD-10-CM

## 2023-11-22 NOTE — Patient Instructions (Signed)
 It was nice to see you today!  If you have any problems before your next visit feel free to message me via MyChart (minor issues or questions) or call the office, otherwise you may reach out to schedule an office visit.  Thank you! Pau Banh, PA-C

## 2023-11-22 NOTE — Progress Notes (Signed)
 Established Patient Office Visit  Subjective   Patient ID: Lori [name], female    DOB: Sep 28, 1940  Age: 83 y.o. MRN: [id number]  Chief Complaint  Patient presents with   Medical Management of Chronic Issues    follow up     HPI  Discussed the use of AI scribe software for clinical note transcription with the patient, who gave verbal consent to proceed.  History of Present Illness   Lori [name] is an 83 year old female with chronic atrial fibrillation and pulmonary embolism who presents for a routine follow-up visit.  She has chronic atrial fibrillation and a history of pulmonary embolism, both managed with Eliquis  and metoprolol . There have been no changes in her medication regimen. No chest pain, shortness of breath, or palpitations. The patient recalls that her last echocardiogram was about two years ago, before her previous doctor passed away.  She experiences dizziness, particularly when standing up, which she manages with diazepam  and meclizine . She takes diazepam  occasionally and meclizine  twice daily at a dose of 5 mg in the morning and 5 mg at night. The dizziness is most pronounced in the morning when she first gets out of bed, but it improves after about an hour.  She has a history of anemia and takes iron  supplements two to three times a week. She feels fine and her iron  levels were checked previously and found to be adequate. No blood in stool and no swelling in legs.  She has ocular hypertension and uses eye drops nightly. No glaucoma has been diagnosed.  She recalls a past incident where she had a significant fall resulting in a swollen leg, but no recent falls and no current issues with swelling.  She lives alone and limits her driving to a small radius around her home due to concerns about safety and lack of confidence in driving longer distances. She enjoys watching wildlife and spending time with her dogs.      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) 02/06/2015   Obesity (BMI 30.0-34.9) 02/06/2015   Medication management 11/18/2013   Hyperlipidemia, mixed    Vitamin D  deficiency    Essential hypertension    Past Medical History:  Diagnosis Date   Anemia    Arthritis    Benign neoplasm of sigmoid colon    Deafness    left ear per pt    Dyspnea    pt reports with exertion due to hip and knees    Dysrhythmia    afib    History of bleeding ulcers    2019 received 3-4 units of blood    Hyperlipidemia    Hypertension    Pericardial effusion 02/04/2018   Pre-diabetes    per Dr Ladona note of 09/28/19 pt denies at preop visit on 10/06/19    Prediabetes    Primary localized osteoarthritis of right hip 09/26/2019   Pulmonary embolism ([name])    hx of 2019    Vertigo    Vitamin D   deficiency    Social History   Tobacco Use   Smoking status: Former    Current packs/day: 0.00    Types: Cigarettes    Quit date: 08/07/1990    Years since quitting: 33.3   Smokeless tobacco: Never  Vaping Use   Vaping status: Never Used  Substance Use Topics   Alcohol use: No    Alcohol/week: 0.0 standard drinks of alcohol   Drug use: No   No Known Allergies    ROS Per HPI.    Objective:     BP 136/67   Pulse (!) 59   Temp 98.5 F (36.9 C) (Oral)   Resp 16   Ht 5' 3 (1.6 m)   Wt 183 lb 9.6 oz (83.3 kg)   SpO2 96%   BMI 32.52 kg/m  BP Readings from Last 3 Encounters:  11/22/23 136/67  08/12/23 (!) 131/57  05/17/23 (!) 148/86   Wt Readings from Last  3 Encounters:  11/22/23 183 lb 9.6 oz (83.3 kg)  08/12/23 181 lb 3.2 oz (82.2 kg)  05/17/23 178 lb (80.7 kg)      Physical Exam Constitutional:      General: She is not in acute distress.    Appearance: Normal appearance. She is not ill-appearing.  HENT:     Head: Normocephalic and atraumatic.     Nose: Nose normal.  Eyes:     General: No scleral icterus.    Conjunctiva/sclera: Conjunctivae normal.  Cardiovascular:     Rate and Rhythm: Normal rate and regular rhythm.     Heart sounds: Normal heart sounds. No murmur heard.    No friction rub.  Pulmonary:     Effort: Pulmonary effort is normal. No respiratory distress.     Breath sounds: Normal breath sounds. No wheezing, rhonchi or rales.  Musculoskeletal:        General: Normal range of motion.  Skin:    General: Skin is warm and dry.     Coloration: Skin is not jaundiced or pale.  Neurological:     General: No focal deficit present.     Mental Status: She is alert.  Psychiatric:        Mood and Affect: Mood normal.        Behavior: Behavior normal.      No results found for any visits on 11/22/23.  Last CBC Lab Results  Component Value Date   WBC 5.9 08/12/2023   HGB 13.4 08/12/2023   HCT 43.9 08/12/2023   MCV 91.5 08/12/2023   MCH 27.9 08/12/2023   RDW 15.8 (H) 08/12/2023   PLT 199 08/12/2023   Last metabolic panel Lab Results  Component Value Date   GLUCOSE 58 (L) 08/12/2023   NA 143 08/12/2023   K 3.8 08/12/2023   CL 107 08/12/2023   CO2 32 08/12/2023   BUN 19 08/12/2023   CREATININE 0.95 08/12/2023   GFRNONAA >60 08/12/2023   CALCIUM 9.2 08/12/2023   PHOS 4.5 04/18/2022   PROT 6.7 08/12/2023   ALBUMIN  4.5 08/12/2023   LABGLOB 1.8 05/17/2023   BILITOT 0.3 08/12/2023   ALKPHOS 102 08/12/2023   AST 15 08/12/2023   ALT 8 08/12/2023   ANIONGAP 4 (L) 08/12/2023   Last lipids Lab Results  Component Value Date   CHOL 131 05/17/2023   HDL 50 05/17/2023   LDLCALC 65 05/17/2023   TRIG 83  05/17/2023   CHOLHDL 2.6 05/17/2023   Last hemoglobin A1c Lab Results  Component Value Date  HGBA1C 5.5 05/17/2023      The ASCVD Risk score (Arnett DK, et al., 2019) failed to calculate for the following reasons:   The 2019 ASCVD risk score is only valid for ages 89 to 34    Assessment & Plan:   Assessment and Plan    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 Intermittent dizziness primarily upon standing, improving after an hour. Diazepam  and meclizine  used with limited effect. - Continue diazepam  and meclizine  as needed.  Chronic atrial fibrillation Chronic, stable. Managed with metoprolol  and Eliquis . Last echocardiogram two years ago. No recent cardiology follow-up due to logistical challenges and preference against travel. - Arrange cardiology visit for echocardiogram renewal.  Thrombophilia/ Hx of Pulmonary embolism Chronic, stable. Managed with Eliquis .  Chronic IDA Chronic, stable. Intermittent iron  supplementation effective with satisfactory recent labs. - Continue current iron  supplementation regimen. - Continue to follow with heme/onc  Hyperlipidemia Chronic, stable. Reassess with lipid panel today.  Chronic kidney disease, stage 3a Continue to abstain from NSAIDs. Continue any renally protective medications      Return in about 3 months (around 02/21/2024) for Chronic Followup.    Kameryn Tisdel T Chandlar Guice, PA-C

## 2023-11-23 ENCOUNTER — Ambulatory Visit (HOSPITAL_BASED_OUTPATIENT_CLINIC_OR_DEPARTMENT_OTHER): Payer: Self-pay | Admitting: Student

## 2023-11-23 LAB — CBC WITH DIFFERENTIAL/PLATELET
Basophils Absolute: 0.1 x10E3/uL (ref 0.0–0.2)
Basos: 1 %
EOS (ABSOLUTE): 0.1 x10E3/uL (ref 0.0–0.4)
Eos: 1 %
Hematocrit: 43.4 % (ref 34.0–46.6)
Hemoglobin: 13.9 g/dL (ref 11.1–15.9)
Immature Grans (Abs): 0 x10E3/uL (ref 0.0–0.1)
Immature Granulocytes: 0 %
Lymphocytes Absolute: 1 x10E3/uL (ref 0.7–3.1)
Lymphs: 13 %
MCH: 30.3 pg (ref 26.6–33.0)
MCHC: 32 g/dL (ref 31.5–35.7)
MCV: 95 fL (ref 79–97)
Monocytes Absolute: 0.8 x10E3/uL (ref 0.1–0.9)
Monocytes: 10 %
Neutrophils Absolute: 5.7 x10E3/uL (ref 1.4–7.0)
Neutrophils: 75 %
Platelets: 174 x10E3/uL (ref 150–450)
RBC: 4.58 x10E6/uL (ref 3.77–5.28)
RDW: 14.4 % (ref 11.7–15.4)
WBC: 7.6 x10E3/uL (ref 3.4–10.8)

## 2023-11-23 LAB — COMPREHENSIVE METABOLIC PANEL WITH GFR
ALT: 9 IU/L (ref 0–32)
AST: 14 IU/L (ref 0–40)
Albumin: 4.1 g/dL (ref 3.7–4.7)
Alkaline Phosphatase: 103 IU/L (ref 44–121)
BUN/Creatinine Ratio: 22 (ref 12–28)
BUN: 21 mg/dL (ref 8–27)
Bilirubin Total: 0.3 mg/dL (ref 0.0–1.2)
CO2: 27 mmol/L (ref 20–29)
Calcium: 9.5 mg/dL (ref 8.7–10.3)
Chloride: 105 mmol/L (ref 96–106)
Creatinine, Ser: 0.94 mg/dL (ref 0.57–1.00)
Globulin, Total: 1.9 g/dL (ref 1.5–4.5)
Glucose: 57 mg/dL — ABNORMAL LOW (ref 70–99)
Potassium: 4.5 mmol/L (ref 3.5–5.2)
Sodium: 144 mmol/L (ref 134–144)
Total Protein: 6 g/dL (ref 6.0–8.5)
eGFR: 61 mL/min/1.73 (ref >59)

## 2023-11-23 LAB — LIPID PANEL
Chol/HDL Ratio: 2.8 ratio (ref 0.0–4.4)
Cholesterol, Total: 149 mg/dL (ref 100–199)
HDL: 54 mg/dL (ref >39)
LDL Chol Calc (NIH): 76 mg/dL (ref 0–99)
Triglycerides: 105 mg/dL (ref 0–149)
VLDL Cholesterol Cal: 19 mg/dL (ref 5–40)

## 2023-12-09 ENCOUNTER — Other Ambulatory Visit (HOSPITAL_BASED_OUTPATIENT_CLINIC_OR_DEPARTMENT_OTHER): Payer: Self-pay | Admitting: Student

## 2023-12-09 DIAGNOSIS — S069XAS Unspecified intracranial injury with loss of consciousness status unknown, sequela: Secondary | ICD-10-CM

## 2023-12-15 ENCOUNTER — Encounter (HOSPITAL_BASED_OUTPATIENT_CLINIC_OR_DEPARTMENT_OTHER): Payer: Self-pay

## 2023-12-15 ENCOUNTER — Ambulatory Visit (HOSPITAL_BASED_OUTPATIENT_CLINIC_OR_DEPARTMENT_OTHER)

## 2023-12-15 VITALS — BP 130/70 | Ht 63.0 in | Wt 180.0 lb

## 2023-12-15 DIAGNOSIS — Z0000 Encounter for general adult medical examination without abnormal findings: Secondary | ICD-10-CM

## 2023-12-15 NOTE — Progress Notes (Unsigned)
 Because this visit was a virtual/telehealth visit,  certain criteria was not obtained, such a blood pressure, CBG if applicable, and timed get up and go. Any medications not marked as taking were not mentioned during the medication reconciliation part of the visit. Any vitals not documented were not able to be obtained due to this being a telehealth visit or patient was unable to self-report a recent blood pressure reading due to a lack of equipment at home via telehealth. Vitals that have been documented are verbally provided by the patient.  This visit was performed by a medical professional under my direct supervision. I was immediately available for consultation/collaboration. I have reviewed and agree with the Annual Wellness Visit documentation.  Subjective:   Lori [name] is a 83 y.o. who presents for a Medicare Wellness preventive visit.  As a reminder, Annual Wellness Visits don't include a physical exam, and some assessments may be limited, especially if this visit is performed virtually. We may recommend an in-person follow-up visit with your provider if needed.  Visit Complete: Virtual I connected with  Lori [name] on 12/15/23 by a audio enabled telemedicine application and verified that I am speaking with the correct person using two identifiers.  Patient Location: Home  Provider Location: Home Office  I discussed the limitations of evaluation and management by telemedicine. The patient expressed understanding and agreed to proceed.  Vital Signs: Because this visit was a virtual/telehealth visit, some criteria may be missing or patient reported. Any vitals not documented were not able to be obtained and vitals that have been documented are patient reported.  VideoDeclined- This patient declined Librarian, academic. Therefore the visit was completed with audio only.  Persons Participating in Visit: Patient.  AWV Questionnaire: No: Patient Medicare AWV  questionnaire was not completed prior to this visit.  Cardiac Risk Factors include: advanced age (>38men, >69 women);hypertension;Other (see comment);dyslipidemia, Risk factor comments: afib     Objective:    Today's Vitals   12/15/23 1139 12/15/23 1140  BP: 130/70   Weight: 180 lb (81.6 kg)   Height: 5' 3 (1.6 m)   PainSc:  0-No pain   Body mass index is 31.89 kg/m.     12/15/2023   11:39 AM 04/17/2022    2:49 PM 10/14/2021   12:09 PM 04/03/2020    1:17 PM 10/17/2019    2:40 PM 10/06/2019   10:05 AM 10/02/2019   11:54 AM  Advanced Directives  Does Patient Have a Medical Advance Directive? No No No No No No No  Would patient like information on creating a medical advance directive? No - Patient declined No - Patient declined No - Patient declined No - Patient declined Yes (MAU/Ambulatory/Procedural Areas - Information given)  Yes (MAU/Ambulatory/Procedural Areas - Information given)    Current Medications (verified) Outpatient Encounter Medications as of 12/15/2023  Medication Sig   acetaminophen  (TYLENOL ) 500 MG tablet Take 500-1,000 mg by mouth every 6 (six) hours as needed for mild pain or moderate pain.   apixaban  (ELIQUIS ) 5 MG TABS tablet Take 1 tablet (5 mg total) by mouth 2 (two) times daily.   Ascorbic Acid (VITAMIN C) 1000 MG tablet Take 1,000 mg by mouth daily.   Cholecalciferol (VITAMIN D3) 1000 units CAPS Take 1,000-4,000 Units by mouth daily.   Cyanocobalamin  (B-12) 1000 MCG CAPS Take by mouth daily.   diazepam  (VALIUM ) 5 MG tablet take ONE-HALF TO ONE tablet by MOUTH TWO TO THREE times daily as needed FOR SEVERE  vertigo OR dizziness   ferrous sulfate  325 (65 FE) MG tablet Take 1 tablet (325 mg total) by mouth See admin instructions. Take 325 mg by mouth three times a week with food (Patient taking differently: Take 325 mg by mouth as needed. Take 325 mg by mouth three times a week with food)   gabapentin  (NEURONTIN ) 300 MG capsule Take  1 capsule  3 x day for Neuropathy  Pain                                                              /                                                      TAKE                                                       BY                                                  MOUTH   latanoprost (XALATAN) 0.005 % ophthalmic solution Place 1 drop into both eyes at bedtime.   meclizine  (ANTIVERT ) 25 MG tablet TAKE ONE-HALF TO ONE TABLET BY MOUTH UP TO THREE TIMES DAILY FOR MOTION SICKNESS/DIZZINESSTAKE ONE-HALF TO ONE TABLET BY MOUTH UP TO THREE TIMES DAILY FOR MOTION SICKNESS/DIZZINESS   metoprolol  tartrate (LOPRESSOR ) 25 MG tablet Take 1 tablet (25 mg total) by mouth daily.   pantoprazole  (PROTONIX ) 40 MG tablet TAKE ONE TABLET BY MOUTH DAILY TO PREVENT HEARTBURN AND INDIGESTION   TURMERIC PO Take 1 capsule by mouth daily.   No facility-administered encounter medications on file as of 12/15/2023.    Allergies (verified) Patient has no known allergies.   History: Past Medical History:  Diagnosis Date   Anemia    Arthritis    Benign neoplasm of sigmoid colon    Deafness    left ear per pt    Dyspnea    pt reports with exertion due to hip and knees    Dysrhythmia    afib    History of bleeding ulcers    2019 received 3-4 units of blood    Hyperlipidemia    Hypertension    Pericardial effusion 02/04/2018   Pre-diabetes    per Dr Ladona note of 09/28/19 pt denies at preop visit on 10/06/19    Prediabetes    Primary localized osteoarthritis of right hip 09/26/2019   Pulmonary embolism ([name])    hx of 2019    Vertigo    Vitamin D  deficiency    Past Surgical History:  Procedure Laterality Date   BIOPSY  02/05/2018   Procedure: BIOPSY;  Surgeon: Aneita Gwendlyn DASEN, MD;  Location: [facility];  Service: Endoscopy;;   CATARACT EXTRACTION W/ INTRAOCULAR LENS  IMPLANT, BILATERAL  2009  CEREBRAL ANEURYSM REPAIR     COLONOSCOPY N/A 02/05/2018   Procedure: COLONOSCOPY;  Surgeon: Aneita Gwendlyn DASEN, MD;  Location: [facility];  Service:  Endoscopy;  Laterality: N/A;   ESOPHAGOGASTRODUODENOSCOPY N/A 02/05/2018   Procedure: ESOPHAGOGASTRODUODENOSCOPY (EGD);  Surgeon: Aneita Gwendlyn DASEN, MD;  Location: [facility];  Service: Endoscopy;  Laterality: N/A;   FOOT SURGERY  1993   POLYPECTOMY  02/05/2018   Procedure: POLYPECTOMY;  Surgeon: Aneita Gwendlyn DASEN, MD;  Location: [facility];  Service: Endoscopy;;   TOTAL HIP ARTHROPLASTY Right 10/17/2019   Procedure: TOTAL HIP ARTHROPLASTY ANTERIOR APPROACH;  Surgeon: Beverley Evalene BIRCH, MD;  Location: [facility];  Service: Orthopedics;  Laterality: Right;   TUBAL LIGATION     Family History  Problem Relation Age of Onset   Hypertension Mother    Diabetes Mother    Cancer Mother        Pancreatic   Cancer Father        Skin   Colon cancer Neg Hx    Stomach cancer Neg Hx    Esophageal cancer Neg Hx    Social History   Socioeconomic History   Marital status: Divorced    Spouse name: Not on file   Number of children: 1   Years of education: 12   Highest education level: Not on file  Occupational History   Occupation: retired  Tobacco Use   Smoking status: Former    Current packs/day: 0.00    Types: Cigarettes    Quit date: 08/07/1990    Years since quitting: 33.3   Smokeless tobacco: Never  Vaping Use   Vaping status: Never Used  Substance and Sexual Activity   Alcohol use: No    Alcohol/week: 0.0 standard drinks of alcohol   Drug use: No   Sexual activity: Not on file  Other Topics Concern   Not on file  Social History Narrative   Not on file   Social Drivers of Health   Financial Resource Strain: Low Risk  (12/15/2023)   Overall Financial Resource Strain (CARDIA)    Difficulty of Paying Living Expenses: Not hard at all  Food Insecurity: No Food Insecurity (12/15/2023)   Hunger Vital Sign    Worried About Running Out of Food in the Last Year: Never true    Ran Out of Food in the Last Year: Never true  Transportation Needs: No Transportation Needs (12/15/2023)    PRAPARE - Administrator, Civil Service (Medical): No    Lack of Transportation (Non-Medical): No  Physical Activity: Sufficiently Active (12/15/2023)   Exercise Vital Sign    Days of Exercise per Week: 7 days    Minutes of Exercise per Session: 30 min  Stress: No Stress Concern Present (12/15/2023)   [employer] of Occupational Health - Occupational Stress Questionnaire    Feeling of Stress: Not at all  Social Connections: Socially Isolated (12/15/2023)   Social Connection and Isolation Panel    Frequency of Communication with Friends and Family: More than three times a week    Frequency of Social Gatherings with Friends and Family: More than three times a week    Attends Religious Services: Never    Database administrator or Organizations: No    Attends Engineer, structural: Never    Marital Status: Divorced    Tobacco Counseling Counseling given: Not Answered    Clinical Intake:  Pre-visit preparation completed: Yes  Pain : No/denies pain Pain Score: 0-No pain  BMI - recorded: 31.89 Nutritional Status: BMI > 30  Obese Nutritional Risks: None Diabetes: No  Lab Results  Component Value Date   HGBA1C 5.5 05/17/2023   HGBA1C 5.7 (H) 02/16/2023   HGBA1C 5.8 (H) 11/12/2022     How often do you need to have someone help you when you read instructions, pamphlets, or other written materials from your doctor or pharmacy?: 1 - Never  Interpreter Needed?: No  Information entered by :: [email or web address]   Activities of Daily Living     12/15/2023   11:45 AM 02/15/2023   11:07 PM  In your present state of health, do you have any difficulty performing the following activities:  Hearing? 0 0  Vision? 0 0  Difficulty concentrating or making decisions? 0 0  Walking or climbing stairs? 0 0  Dressing or bathing? 0 0  Doing errands, shopping? 0 0  Preparing Food and eating ? N   Using the Toilet? N   In the past six months, have you  accidently leaked urine? N   Do you have problems with loss of bowel control? N   Managing your Medications? N   Managing your Finances? N   Housekeeping or managing your Housekeeping? N     Patient Care Team: Rothfuss, Jacob T, PA-C as PCP - General (Physician Assistant) Tonita Fallow, MD (Inactive) as PCP - Internal Medicine (Internal Medicine) Octavia Charleston, MD as Consulting Physician (Ophthalmology) Aneita Gwendlyn DASEN, MD (Inactive) as Consulting Physician (Gastroenterology) Quinn Odor, [facility] (Inactive) as Pharmacist (Pharmacist)  I have updated your Care Teams any recent Medical Services you may have received from other providers in the past year.     Assessment:   This is a routine wellness examination for [place].  Hearing/Vision screen Hearing Screening - Comments:: Deaf in right ear and hard to hear in the left ear  Vision Screening - Comments:: Patient wears glasses    Goals Addressed             This Visit's Progress    DIET - INCREASE WATER  INTAKE   On track    4+ bottles daily, reduce pepsi       Depression Screen     12/15/2023   11:47 AM 11/22/2023   11:25 AM 05/17/2023   10:57 AM 02/15/2023   11:07 PM 08/12/2022    9:42 PM 04/16/2022   11:48 AM 01/17/2022   11:52 PM  PHQ 2/9 Scores  PHQ - 2 Score 0 0 0 0 0 0 0  PHQ- 9 Score 0  4        Fall Risk     12/15/2023   11:42 AM 05/17/2023   10:56 AM 02/15/2023   11:07 PM 08/12/2022    9:42 PM 04/16/2022   11:48 AM  Fall Risk   Falls in the past year? 1 1 0 0 0  Number falls in past yr: 0 1     Injury with Fall? 0 1     Risk for fall due to : History of fall(s) History of fall(s);Impaired balance/gait No Fall Risks No Fall Risks   Follow up Falls evaluation completed;Education provided;Falls prevention discussed Education provided;Falls evaluation completed Falls prevention discussed;Education provided;Falls evaluation completed Falls prevention discussed;Education provided;Falls evaluation completed      MEDICARE RISK AT HOME:  Medicare Risk at Home Any stairs in or around the home?: No If so, are there any without handrails?: No Home free of loose throw rugs in walkways, pet beds, electrical cords,  etc?: Yes Adequate lighting in your home to reduce risk of falls?: Yes Life alert?: No Use of a cane, walker or w/c?: Yes Grab bars in the bathroom?: Yes Shower chair or bench in shower?: Yes Elevated toilet seat or a handicapped toilet?: Yes  TIMED UP AND GO:  Was the test performed?  No  Cognitive Function: 6CIT completed    04/03/2020    1:22 PM  MMSE - Mini Mental State Exam  Orientation to time 5  Orientation to Place 5  Registration 3  Attention/ Calculation 5  Recall 2  Language- name 2 objects 2  Language- repeat 1  Language- follow 3 step command 3  Language- read & follow direction 1  Write a sentence 1  Copy design 1  Total score 29        12/15/2023   11:47 AM  6CIT Screen  What Year? 0 points  What month? 0 points  What time? 0 points  Count back from 20 0 points  Months in reverse 0 points  Repeat phrase 0 points  Total Score 0 points    Immunizations Immunization History  Administered Date(s) Administered   DT (Pediatric) 01/24/2014   Pneumococcal Conjugate-13 05/26/2018   Pneumococcal Polysaccharide-23 10/06/2011   Td 04/23/2003    Screening Tests Health Maintenance  Topic Date Due   Zoster Vaccines- Shingrix (1 of 2) Never done   Influenza Vaccine  Never done   COVID-19 Vaccine (1) 11/21/2024 (Originally 08/29/1945)   DTaP/Tdap/Td (3 - Tdap) 01/25/2024   Medicare Annual Wellness (AWV)  12/14/2024   Pneumococcal Vaccine: 50+ Years  Completed   DEXA SCAN  Completed   HPV VACCINES  Aged Out   Meningococcal B Vaccine  Aged Out   Colonoscopy  Discontinued   Hepatitis C Screening  Discontinued    Health Maintenance Items Addressed:patient declined vaccinations    Additional Screening:  Vision Screening: Recommended annual  ophthalmology exams for early detection of glaucoma and other disorders of the eye. Is the patient up to date with their annual eye exam?  No  Who is the provider or what is the name of the office in which the patient attends annual eye exams?   Dental Screening: Recommended annual dental exams for proper oral hygiene  Community Resource Referral / Chronic Care Management: CRR required this visit?  No   CCM required this visit?  No   Plan:    I have personally reviewed and noted the following in the patient's chart:   Medical and social history Use of alcohol, tobacco or illicit drugs  Current medications and supplements including opioid prescriptions. Patient is not currently taking opioid prescriptions. Functional ability and status Nutritional status Physical activity Advanced directives List of other physicians Hospitalizations, surgeries, and ER visits in previous 12 months Vitals Screenings to include cognitive, depression, and falls Referrals and appointments  In addition, I have reviewed and discussed with patient certain preventive protocols, quality metrics, and best practice recommendations. A written personalized care plan for preventive services as well as general preventive health recommendations were provided to patient.   Lyle MARLA Right, [place]   12/15/2023   After Visit Summary: (MyChart) Due to this being a telephonic visit, the after visit summary with patients personalized plan was offered to patient via MyChart   Notes: Nothing significant to report at this time.

## 2023-12-15 NOTE — Patient Instructions (Addendum)
 Lori [name],  Thank you for taking the time for your Medicare Wellness Visit. I appreciate your continued commitment to your health goals. Please review the care plan we discussed, and feel free to reach out if I can assist you further.  Medicare recommends these wellness visits once per year to help you and your care team stay ahead of potential health issues. These visits are designed to focus on prevention, allowing your provider to concentrate on managing your acute and chronic conditions during your regular appointments.  Please note that Annual Wellness Visits do not include a physical exam. Some assessments may be limited, especially if the visit was conducted virtually. If needed, we may recommend a separate in-person follow-up with your provider.  Ongoing Care Seeing your primary care provider every 3 to 6 months helps us  monitor your health and provide consistent, personalized care.   Referrals If a referral was made during today's visit and you haven't received any updates within two weeks, please contact the referred provider directly to check on the status.  Recommended Screenings:  Health Maintenance  Topic Date Due   Zoster (Shingles) Vaccine (1 of 2) Never done   Flu Shot  Never done   COVID-19 Vaccine (1) 11/21/2024*   DTaP/Tdap/Td vaccine (3 - Tdap) 01/25/2024   Medicare Annual Wellness Visit  12/14/2024   Pneumococcal Vaccine for age over 47  Completed   DEXA scan (bone density measurement)  Completed   HPV Vaccine  Aged Out   Meningitis B Vaccine  Aged Out   Colon Cancer Screening  Discontinued   Hepatitis C Screening  Discontinued  *Topic was postponed. The date shown is not the original due date.       12/15/2023   11:39 AM  Advanced Directives  Does Patient Have a Medical Advance Directive? No  Would patient like information on creating a medical advance directive? No - Patient declined   Advance Care Planning is important because it: Ensures you receive  medical care that aligns with your values, goals, and preferences. Provides guidance to your family and loved ones, reducing the emotional burden of decision-making during critical moments.  Vision: Annual vision screenings are recommended for early detection of glaucoma, cataracts, and diabetic retinopathy. These exams can also reveal signs of chronic conditions such as diabetes and high blood pressure.  Dental: Annual dental screenings help detect early signs of oral cancer, gum disease, and other conditions linked to overall health, including heart disease and diabetes.  Please see the attached documents for additional preventive care recommendations.[email or web address]

## 2023-12-23 ENCOUNTER — Other Ambulatory Visit (HOSPITAL_BASED_OUTPATIENT_CLINIC_OR_DEPARTMENT_OTHER): Payer: Self-pay | Admitting: Student

## 2023-12-23 NOTE — Telephone Encounter (Signed)
 [facility] called and spoke to [place], [facility] about the refill(s) medication requested. Advised patient wasn't sure of the name of the medication needed stated that the pharmacy has sent over a request and she's out of the medication x 2 days. [place] says the request for metoprolol  has been faxed to the office. Advised I will send this request to the provider.  [email or web address] Copied from CRM [id number]. Topic: Clinical - Prescription Issue >> Dec 23, 2023  4:38 PM Alfonso HERO wrote: Reason for CRM: patient called because the pharmacy said they sent over a refill request and haven't heard back from the office. Can you please reach out to:   [facility] - [place] [place] - [address] [place] [address] Phone: [phone] Fax: [phone] Hours: Not open 24 hours

## 2023-12-24 MED ORDER — METOPROLOL TARTRATE 25 MG PO TABS: 25.0000 mg | ORAL_TABLET | Freq: Every day | ORAL | 0 refills | Status: DC

## 2023-12-28 ENCOUNTER — Other Ambulatory Visit (HOSPITAL_BASED_OUTPATIENT_CLINIC_OR_DEPARTMENT_OTHER): Payer: Self-pay | Admitting: Student

## 2024-02-02 DIAGNOSIS — H40053 Ocular hypertension, bilateral: Secondary | ICD-10-CM | POA: Diagnosis not present

## 2024-02-02 DIAGNOSIS — H0102A Squamous blepharitis right eye, upper and lower eyelids: Secondary | ICD-10-CM | POA: Diagnosis not present

## 2024-02-02 DIAGNOSIS — H0102B Squamous blepharitis left eye, upper and lower eyelids: Secondary | ICD-10-CM | POA: Diagnosis not present

## 2024-02-02 DIAGNOSIS — Z961 Presence of intraocular lens: Secondary | ICD-10-CM | POA: Diagnosis not present

## 2024-02-14 ENCOUNTER — Inpatient Hospital Stay

## 2024-02-14 ENCOUNTER — Encounter: Payer: Self-pay | Admitting: Adult Health

## 2024-02-14 ENCOUNTER — Inpatient Hospital Stay: Attending: Adult Health | Admitting: Adult Health

## 2024-02-14 DIAGNOSIS — D508 Other iron deficiency anemias: Secondary | ICD-10-CM | POA: Insufficient documentation

## 2024-02-14 DIAGNOSIS — D500 Iron deficiency anemia secondary to blood loss (chronic): Secondary | ICD-10-CM

## 2024-02-14 DIAGNOSIS — Z79899 Other long term (current) drug therapy: Secondary | ICD-10-CM

## 2024-02-14 DIAGNOSIS — Z87891 Personal history of nicotine dependence: Secondary | ICD-10-CM | POA: Diagnosis not present

## 2024-02-14 LAB — CBC WITH DIFFERENTIAL (CANCER CENTER ONLY)
Abs Immature Granulocytes: 0.02 K/uL (ref 0.00–0.07)
Basophils Absolute: 0.1 K/uL (ref 0.0–0.1)
Basophils Relative: 1 %
Eosinophils Absolute: 0.1 K/uL (ref 0.0–0.5)
Eosinophils Relative: 1 %
HCT: 43.6 % (ref 36.0–46.0)
Hemoglobin: 13.9 g/dL (ref 12.0–15.0)
Immature Granulocytes: 0 %
Lymphocytes Relative: 17 %
Lymphs Abs: 1.1 K/uL (ref 0.7–4.0)
MCH: 29.7 pg (ref 26.0–34.0)
MCHC: 31.9 g/dL (ref 30.0–36.0)
MCV: 93.2 fL (ref 80.0–100.0)
Monocytes Absolute: 0.7 K/uL (ref 0.1–1.0)
Monocytes Relative: 11 %
Neutro Abs: 4.7 K/uL (ref 1.7–7.7)
Neutrophils Relative %: 70 %
Platelet Count: 186 K/uL (ref 150–400)
RBC: 4.68 MIL/uL (ref 3.87–5.11)
RDW: 13.8 % (ref 11.5–15.5)
WBC Count: 6.7 K/uL (ref 4.0–10.5)
nRBC: 0 % (ref 0.0–0.2)

## 2024-02-14 LAB — CMP (CANCER CENTER ONLY)
ALT: 14 U/L (ref 0–44)
AST: 19 U/L (ref 15–41)
Albumin: 4.3 g/dL (ref 3.5–5.0)
Alkaline Phosphatase: 100 U/L (ref 38–126)
Anion gap: 9 (ref 5–15)
BUN: 20 mg/dL (ref 8–23)
CO2: 27 mmol/L (ref 22–32)
Calcium: 9.3 mg/dL (ref 8.9–10.3)
Chloride: 108 mmol/L (ref 98–111)
Creatinine: 0.96 mg/dL (ref 0.44–1.00)
GFR, Estimated: 59 mL/min — ABNORMAL LOW (ref >60)
Glucose, Bld: 72 mg/dL (ref 70–99)
Potassium: 4.2 mmol/L (ref 3.5–5.1)
Sodium: 144 mmol/L (ref 135–145)
Total Bilirubin: 0.4 mg/dL (ref 0.0–1.2)
Total Protein: 6.5 g/dL (ref 6.5–8.1)

## 2024-02-14 LAB — IRON AND IRON BINDING CAPACITY (CC-WL,HP ONLY)
Iron: 51 ug/dL (ref 28–170)
Saturation Ratios: 12 % (ref 10.4–31.8)
TIBC: 423 ug/dL (ref 250–450)
UIBC: 372 ug/dL

## 2024-02-14 LAB — FERRITIN: Ferritin: 26 ng/mL (ref 11–307)

## 2024-02-14 LAB — RETIC PANEL
Immature Retic Fract: 14.6 % (ref 2.3–15.9)
RBC.: 4.59 MIL/uL (ref 3.87–5.11)
Retic Count, Absolute: 68.4 K/uL (ref 19.0–186.0)
Retic Ct Pct: 1.5 % (ref 0.4–3.1)
Reticulocyte Hemoglobin: 32.3 pg (ref >27.9)

## 2024-02-14 LAB — VITAMIN B12: Vitamin B-12: 1663 pg/mL — ABNORMAL HIGH (ref 180–914)

## 2024-02-14 MED ORDER — GABAPENTIN 300 MG PO CAPS: ORAL_CAPSULE | ORAL | 3 refills | Status: DC

## 2024-02-14 NOTE — Progress Notes (Unsigned)
 [facility] Cancer Follow up:    Lori [name] [address] [place] [address]   DIAGNOSIS: IRON  DEFICIENCY ANEMIA   SUMMARY OF HEMATOLOGIC HISTORY: Received iron  in February 05, 2018-Feraheme  510mg  Referred to Dr. Gudena for evaluation on October 30, 2021--at that time her hemoglobin was 8.2, MCV 73.5, RDW 16.5, CMP normal, TIBC 457, iron  saturation 4%, ferritin is 3 Venofer  administered at 400mg  on 11/07/2021 and 11/14/2021 Follow-up hemoglobin on January 14, 2022 demonstrated a hemoglobin of 11.8, MCV of 90, ferritin of 31, TIBC of 357, and saturation of 13%. Referral to GI placed by Dr. Odean in August of 2023. Hospital admission from April 17, 2022 through April 18, 2022 for symptomatic anemia requiring Protonix  drip, blood transfusion. Ferritin in f/u was 3 Venofer  400mg  given weekly x 3 beginning 04/30/2022 Colonoscopy and upper endoscopy completed on April 23, 2022.  There were polyps removed, diverticulosis noted, LA grade a reflux esophagitis with bleeding, large hiatal hernia, gastritis, and a normal examined duodenum.  She was recommended Protonix  40 mg twice daily.  CURRENT THERAPY: oral iron   INTERVAL HISTORY:  Discussed the use of AI scribe software for clinical note transcription with the patient, who gave verbal consent to proceed.  History of Present Illness Lori [name] is an 83 year old female with iron  deficiency anemia who presents for follow-up.  She takes oral iron  inconsistently, stating she uses it only when she remembers. She has had no recent blood in her stool, vaginal bleeding, other bleeding, or lightheadedness. She feels more tired and wants to sleep much of the day, which she attributes to having little to do.  Her last iron  prescription was sent in February of the previous year. She now follows with a new GI doctor in [place] for ongoing care related to her anemia.     Patient Active Problem List   Diagnosis Date  Noted   Arthritis 05/17/2023   Iron  deficiency anemia 04/17/2022   Thrombophilia 04/20/2019   Spinal stenosis of lumbar region 04/20/2019   Iron  deficiency anemia due to chronic blood loss 03/08/2019   Varicose vein of leg 09/01/2018   Gastroesophageal reflux disease with esophagitis    Symptomatic anemia 02/04/2018   History of pulmonary embolism (01/2018) 02/04/2018   Senile purpura 07/20/2017   Atrial fibrillation ([name]) 06/16/2017   Vertigo due to brain injury 02/06/2015   Obesity (BMI 30.0-34.9) 02/06/2015   Medication management 11/18/2013   Hyperlipidemia, mixed    Vitamin D  deficiency    Essential hypertension     has no known allergies.  MEDICAL HISTORY: Past Medical History:  Diagnosis Date   Anemia    Arthritis    Benign neoplasm of sigmoid colon    Deafness    left ear per pt    Dyspnea    pt reports with exertion due to hip and knees    Dysrhythmia    afib    History of bleeding ulcers    2019 received 3-4 units of blood    Hyperlipidemia    Hypertension    Pericardial effusion 02/04/2018   Pre-diabetes    per Dr Ladona note of 09/28/19 pt denies at preop visit on 10/06/19    Prediabetes    Primary localized osteoarthritis of right hip 09/26/2019   Pulmonary embolism ([name])    hx of 2019    Vertigo    Vitamin D  deficiency     SURGICAL HISTORY: Past Surgical History:  Procedure Laterality Date  BIOPSY  02/05/2018   Procedure: BIOPSY;  Surgeon: Aneita Gwendlyn DASEN, MD;  Location: [facility];  Service: Endoscopy;;   CATARACT EXTRACTION W/ INTRAOCULAR LENS  IMPLANT, BILATERAL  2009   CEREBRAL ANEURYSM REPAIR     COLONOSCOPY N/A 02/05/2018   Procedure: COLONOSCOPY;  Surgeon: Aneita Gwendlyn DASEN, MD;  Location: [facility];  Service: Endoscopy;  Laterality: N/A;   ESOPHAGOGASTRODUODENOSCOPY N/A 02/05/2018   Procedure: ESOPHAGOGASTRODUODENOSCOPY (EGD);  Surgeon: Aneita Gwendlyn DASEN, MD;  Location: [facility];  Service: Endoscopy;  Laterality: N/A;   FOOT  SURGERY  1993   POLYPECTOMY  02/05/2018   Procedure: POLYPECTOMY;  Surgeon: Aneita Gwendlyn DASEN, MD;  Location: [facility];  Service: Endoscopy;;   TOTAL HIP ARTHROPLASTY Right 10/17/2019   Procedure: TOTAL HIP ARTHROPLASTY ANTERIOR APPROACH;  Surgeon: Beverley Evalene BIRCH, MD;  Location: [facility];  Service: Orthopedics;  Laterality: Right;   TUBAL LIGATION      SOCIAL HISTORY: Social History   Socioeconomic History   Marital status: Divorced    Spouse name: Not on file   Number of children: 1   Years of education: 12   Highest education level: Not on file  Occupational History   Occupation: retired  Tobacco Use   Smoking status: Former    Current packs/day: 0.00    Types: Cigarettes    Quit date: 08/07/1990    Years since quitting: 33.5   Smokeless tobacco: Never  Vaping Use   Vaping status: Never Used  Substance and Sexual Activity   Alcohol use: No    Alcohol/week: 0.0 standard drinks of alcohol   Drug use: No   Sexual activity: Not on file  Other Topics Concern   Not on file  Social History Narrative   Not on file   Social Drivers of Health   Financial Resource Strain: Low Risk  (02/14/2024)   Overall Financial Resource Strain (CARDIA)    Difficulty of Paying Living Expenses: Not hard at all  Food Insecurity: No Food Insecurity (02/14/2024)   Hunger Vital Sign    Worried About Running Out of Food in the Last Year: Never true    Ran Out of Food in the Last Year: Never true  Transportation Needs: No Transportation Needs (02/14/2024)   PRAPARE - Administrator, Civil Service (Medical): No    Lack of Transportation (Non-Medical): No  Physical Activity: Inactive (02/14/2024)   Exercise Vital Sign    Days of Exercise per Week: 0 days    Minutes of Exercise per Session: 0 min  Stress: No Stress Concern Present (02/14/2024)   [employer] of Occupational Health - Occupational Stress Questionnaire    Feeling of Stress: Not at all  Social Connections: Moderately  Integrated (02/14/2024)   Social Connection and Isolation Panel    Frequency of Communication with Friends and Family: Three times a week    Frequency of Social Gatherings with Friends and Family: Once a week    Attends Religious Services: More than 4 times per year    Active Member of [employer] or Organizations: No    Attends Engineer, Structural: 1 to 4 times per year    Marital Status: Divorced  Recent Concern: Social Connections - Socially Isolated (12/15/2023)   Social Connection and Isolation Panel    Frequency of Communication with Friends and Family: More than three times a week    Frequency of Social Gatherings with Friends and Family: More than three times a week    Attends Religious Services:  Never    Active Member of Clubs or Organizations: No    Attends Banker Meetings: Never    Marital Status: Divorced  Intimate Partner Violence: Not At Risk (02/14/2024)   Humiliation, Afraid, Rape, and Kick questionnaire    Fear of Current or Ex-Partner: No    Emotionally Abused: No    Physically Abused: No    Sexually Abused: No    FAMILY HISTORY: Family History  Problem Relation Age of Onset   Hypertension Mother    Diabetes Mother    Cancer Mother        Pancreatic   Cancer Father        Skin   Colon cancer Neg Hx    Stomach cancer Neg Hx    Esophageal cancer Neg Hx     Review of Systems  Constitutional:  Negative for appetite change, chills, fatigue, fever and unexpected weight change.  HENT:   Negative for hearing loss, lump/mass and trouble swallowing.   Eyes:  Negative for eye problems and icterus.  Respiratory:  Negative for chest tightness, cough and shortness of breath.   Cardiovascular:  Negative for chest pain, leg swelling and palpitations.  Gastrointestinal:  Negative for abdominal distention, abdominal pain, constipation, diarrhea, nausea and vomiting.  Endocrine: Negative for hot flashes.  Genitourinary:  Negative for difficulty urinating.    Musculoskeletal:  Negative for arthralgias.  Skin:  Negative for itching and rash.  Neurological:  Negative for dizziness, extremity weakness, headaches and numbness.  Hematological:  Negative for adenopathy. Does not bruise/bleed easily.  Psychiatric/Behavioral:  Negative for depression. The patient is not nervous/anxious.       PHYSICAL EXAMINATION    There were no vitals filed for this visit.  Physical Exam Constitutional:      General: She is not in acute distress.    Appearance: Normal appearance. She is not toxic-appearing.  HENT:     Head: Normocephalic and atraumatic.     Mouth/Throat:     Mouth: Mucous membranes are moist.     Pharynx: Oropharynx is clear. No oropharyngeal exudate or posterior oropharyngeal erythema.  Eyes:     General: No scleral icterus. Cardiovascular:     Rate and Rhythm: Normal rate and regular rhythm.     Pulses: Normal pulses.     Heart sounds: Normal heart sounds.  Pulmonary:     Effort: Pulmonary effort is normal.     Breath sounds: Normal breath sounds.  Abdominal:     General: Abdomen is flat. Bowel sounds are normal. There is no distension.     Palpations: Abdomen is soft.     Tenderness: There is no abdominal tenderness.  Musculoskeletal:        General: No swelling.     Cervical back: Neck supple.  Lymphadenopathy:     Cervical: No cervical adenopathy.  Skin:    General: Skin is warm and dry.     Findings: No rash.  Neurological:     General: No focal deficit present.     Mental Status: She is alert.  Psychiatric:        Mood and Affect: Mood normal.        Behavior: Behavior normal.     LABORATORY DATA:  CBC    Component Value Date/Time   WBC 6.7 02/14/2024 1032   WBC 5.4 02/16/2023 1149   RBC 4.59 02/14/2024 1032   RBC 4.68 02/14/2024 1032   HGB 13.9 02/14/2024 1032   HGB 13.9 11/22/2023 1201  HCT 43.6 02/14/2024 1032   HCT 43.4 11/22/2023 1201   PLT 186 02/14/2024 1032   PLT 174 11/22/2023 1201   MCV  93.2 02/14/2024 1032   MCV 95 11/22/2023 1201   MCH 29.7 02/14/2024 1032   MCHC 31.9 02/14/2024 1032   RDW 13.8 02/14/2024 1032   RDW 14.4 11/22/2023 1201   LYMPHSABS 1.1 02/14/2024 1032   LYMPHSABS 1.0 11/22/2023 1201   MONOABS 0.7 02/14/2024 1032   EOSABS 0.1 02/14/2024 1032   EOSABS 0.1 11/22/2023 1201   BASOSABS 0.1 02/14/2024 1032   BASOSABS 0.1 11/22/2023 1201    CMP     Component Value Date/Time   NA 144 02/14/2024 1032   NA 144 11/22/2023 1201   K 4.2 02/14/2024 1032   CL 108 02/14/2024 1032   CO2 27 02/14/2024 1032   GLUCOSE 72 02/14/2024 1032   BUN 20 02/14/2024 1032   BUN 21 11/22/2023 1201   CREATININE 0.96 02/14/2024 1032   CREATININE 1.09 (H) 02/16/2023 1149   CALCIUM 9.3 02/14/2024 1032   PROT 6.5 02/14/2024 1032   PROT 6.0 11/22/2023 1201   ALBUMIN  4.3 02/14/2024 1032   ALBUMIN  4.1 11/22/2023 1201   AST 19 02/14/2024 1032   ALT 14 02/14/2024 1032   ALKPHOS 100 02/14/2024 1032   BILITOT 0.4 02/14/2024 1032   GFRNONAA 59 (L) 02/14/2024 1032   GFRNONAA 61 07/02/2020 1519   GFRAA 71 07/02/2020 1519     ASSESSMENT and THERAPY PLAN:   No problem-specific Assessment & Plan notes found for this encounter.   Assessment and Plan Assessment & Plan Iron  deficiency anemia Managed with oral iron  therapy. Good tolerance and adherence reported. Labs indicate well-managed anemia. - Continue oral iron  therapy as tolerated. - Scheduled annual follow-up to monitor labs and anemia status.      All questions were answered. The patient knows to call the clinic with any problems, questions or concerns. We can certainly see the patient much sooner if necessary.  Total encounter time:*** minutes*in face-to-face visit time, chart review, lab review, care coordination, order entry, and documentation of the encounter time.    Morna Kendall, NP 02/14/24 4:49 PM Medical Oncology and Hematology [facility] [address] [place], [place]  [address] Tel. [phone]    Fax. [phone]  *Total Encounter Time as defined by the Centers for Medicare and Medicaid Services includes, in addition to the face-to-face time of a patient visit (documented in the note above) non-face-to-face time: obtaining and reviewing outside history, ordering and reviewing medications, tests or procedures, care coordination (communications with other health care professionals or caregivers) and documentation in the medical record.

## 2024-02-14 NOTE — Progress Notes (Signed)
 Oncology Visit Note  Patient seen today by Morna Kendall, DNP.  Chief Complaint: F/U iron  deficiency anemia  Labs Reviewed: CBC/CMP within normal range; Vitamin B12 elevated.  Intake:  Patient roomed for oncology visit.  Vital signs stable (VSS).  Blood pressure elevated; repeated twice per guidance.  All intake questions reviewed and answered.  Patient is ready for provider evaluation.

## 2024-02-15 ENCOUNTER — Encounter: Payer: Self-pay | Admitting: Hematology and Oncology

## 2024-02-21 ENCOUNTER — Ambulatory Visit (INDEPENDENT_AMBULATORY_CARE_PROVIDER_SITE_OTHER): Admitting: Student

## 2024-02-21 ENCOUNTER — Encounter (HOSPITAL_BASED_OUTPATIENT_CLINIC_OR_DEPARTMENT_OTHER): Payer: Self-pay | Admitting: Student

## 2024-02-21 VITALS — BP 114/73 | HR 92 | Temp 97.9°F | Resp 16 | Ht 63.0 in | Wt 186.9 lb

## 2024-02-21 DIAGNOSIS — Z79899 Other long term (current) drug therapy: Secondary | ICD-10-CM

## 2024-02-21 DIAGNOSIS — E559 Vitamin D deficiency, unspecified: Secondary | ICD-10-CM | POA: Diagnosis not present

## 2024-02-21 DIAGNOSIS — K2100 Gastro-esophageal reflux disease with esophagitis, without bleeding: Secondary | ICD-10-CM | POA: Diagnosis not present

## 2024-02-21 DIAGNOSIS — I480 Paroxysmal atrial fibrillation: Secondary | ICD-10-CM

## 2024-02-21 DIAGNOSIS — R42 Dizziness and giddiness: Secondary | ICD-10-CM | POA: Diagnosis not present

## 2024-02-21 DIAGNOSIS — N1831 Chronic kidney disease, stage 3a: Secondary | ICD-10-CM | POA: Insufficient documentation

## 2024-02-21 DIAGNOSIS — E782 Mixed hyperlipidemia: Secondary | ICD-10-CM | POA: Diagnosis not present

## 2024-02-21 DIAGNOSIS — Z86711 Personal history of pulmonary embolism: Secondary | ICD-10-CM | POA: Diagnosis not present

## 2024-02-21 DIAGNOSIS — R7303 Prediabetes: Secondary | ICD-10-CM | POA: Insufficient documentation

## 2024-02-21 DIAGNOSIS — D500 Iron deficiency anemia secondary to blood loss (chronic): Secondary | ICD-10-CM

## 2024-02-21 DIAGNOSIS — S069XAS Unspecified intracranial injury with loss of consciousness status unknown, sequela: Secondary | ICD-10-CM

## 2024-02-21 DIAGNOSIS — I10 Essential (primary) hypertension: Secondary | ICD-10-CM | POA: Diagnosis not present

## 2024-02-21 HISTORY — DX: Chronic kidney disease, stage 3a: N18.31

## 2024-02-21 MED ORDER — GABAPENTIN 300 MG PO CAPS: ORAL_CAPSULE | ORAL | 3 refills | Status: AC

## 2024-02-21 NOTE — Progress Notes (Signed)
 Established Patient Office Visit  Subjective   Patient ID: [name], female    DOB: 04-Aug-1940  Age: 83 y.o. MRN: [id number]  Chief Complaint  Patient presents with   Medical Management of Chronic Issues    Follow up.   Peripheral Neuropathy    Needs gabapentin  refilled. Pt does not know what happened to provider but he is no longer there. Was told to get refill from PCP.    HPI  Discussed the use of AI scribe software for clinical note transcription with the patient, who gave verbal consent to proceed.  History of Present Illness   Lori [name] is an 83 year old female who presents for a follow-up visit and medication refill.  She is seeking a refill for gabapentin , which she uses occasionally to aid with sleep. A supply was provided at the hospital a week ago, intended to last three weeks. She sometimes takes one or two tablets at night to help her sleep through the night without waking up to use the bathroom. She is unsure why gabapentin  was initially prescribed but recalls it was suggested for sleep issues. No specific pain is relieved by gabapentin , although she has arthritis that occasionally bothers her.  She has a history of arthritis, causing joint aches and fatigue, especially after physical activity like moving furniture. The pain is described as an ache rather than burning, which makes her feel like she might fall if she doesn't rest. She reports that her arthritis pain does not feel like burning and she does not currently have leg pain.  Her past medical history includes spinal stenosis, for which she previously received spine injections without symptom relief. She had trouble walking and issues with her legs in the past, but these symptoms have since resolved. She reports that her previous leg issues related to spinal stenosis have resolved.  She takes Eliquis  twice daily due to a history of a pulmonary embolism. She emphasizes the importance of taking Eliquis  regularly,  especially after an incident where she fell through her living room floor, resulting in significant bruising.  She also takes diazepam  and another unspecified medication for vertigo, although she rarely uses the latter. She mentions occasional dizziness.  Her blood pressure fluctuates, sometimes being high and sometimes low. She experiences heartburn, which is managed with Protonix . Overeating certain foods, like 'scoops,' can exacerbate her symptoms.  [place] and is responsible for all her household tasks. She has eight dogs that she cares for.      Patient Active Problem List   Diagnosis Date Noted   Prediabetes 02/21/2024   CKD stage 3a, GFR 45-59 ml/min ([name]) 02/21/2024   Arthritis 05/17/2023   Iron  deficiency anemia 04/17/2022   Thrombophilia 04/20/2019   Spinal stenosis of lumbar region 04/20/2019   Iron  deficiency anemia due to chronic blood loss 03/08/2019   Varicose vein of leg 09/01/2018   Gastroesophageal reflux disease with esophagitis    Symptomatic anemia 02/04/2018   History of pulmonary embolism (01/2018) 02/04/2018   Senile purpura 07/20/2017   Atrial fibrillation ([name]) 06/16/2017   Vertigo due to brain injury 02/06/2015   Obesity (BMI 30.0-34.9) 02/06/2015   Medication management 11/18/2013   Hyperlipidemia, mixed    Vitamin D  deficiency    Essential hypertension    Past Medical History:  Diagnosis Date   Anemia    Arthritis    Benign neoplasm of sigmoid colon    Deafness    left ear per  pt    Dyspnea    pt reports with exertion due to hip and knees    Dysrhythmia    afib    History of bleeding ulcers    2019 received 3-4 units of blood    Hyperlipidemia    Hypertension    Pericardial effusion 02/04/2018   Pre-diabetes    per Dr Ladona note of 09/28/19 pt denies at preop visit on 10/06/19    Prediabetes    Primary localized osteoarthritis of right hip 09/26/2019   Pulmonary embolism ([name])    hx of 2019    Vertigo    Vitamin  D deficiency    Social History   Tobacco Use   Smoking status: Former    Current packs/day: 0.00    Types: Cigarettes    Quit date: 08/07/1990    Years since quitting: 33.5   Smokeless tobacco: Never  Vaping Use   Vaping status: Never Used  Substance Use Topics   Alcohol use: No    Alcohol/week: 0.0 standard drinks of alcohol   Drug use: No   No Known Allergies    ROS Per HPI.    Objective:     BP 114/73   Pulse 92   Temp 97.9 F (36.6 C) (Oral)   Resp 16   Ht 5' 3 (1.6 m)   Wt 186 lb 14.4 oz (84.8 kg)   SpO2 97%   BMI 33.11 kg/m  BP Readings from Last 3 Encounters:  02/21/24 114/73  02/14/24 (!) 141/89  12/15/23 130/70   Wt Readings from Last 3 Encounters:  02/21/24 186 lb 14.4 oz (84.8 kg)  02/14/24 186 lb 1.6 oz (84.4 kg)  12/15/23 180 lb (81.6 kg)   SpO2 Readings from Last 3 Encounters:  02/21/24 97%  02/14/24 100%  11/22/23 96%      Physical Exam Constitutional:      General: She is not in acute distress.    Appearance: Normal appearance. She is not ill-appearing.  HENT:     Head: Normocephalic and atraumatic.     Nose: Nose normal.  Eyes:     General: No scleral icterus.    Conjunctiva/sclera: Conjunctivae normal.  Neck:     Vascular: No carotid bruit.  Cardiovascular:     Rate and Rhythm: Normal rate and regular rhythm.     Heart sounds: Normal heart sounds. No murmur heard.    No friction rub.  Pulmonary:     Effort: Pulmonary effort is normal. No respiratory distress.     Breath sounds: Normal breath sounds. No wheezing, rhonchi or rales.  Musculoskeletal:        General: Normal range of motion.     Right lower leg: No edema.     Left lower leg: No edema.  Skin:    General: Skin is warm and dry.     Coloration: Skin is not jaundiced or pale.  Neurological:     General: No focal deficit present.     Mental Status: She is alert.  Psychiatric:        Mood and Affect: Mood normal.        Behavior: Behavior normal.      No  results found for any visits on 02/21/24.  Last CBC Lab Results  Component Value Date   WBC 6.7 02/14/2024   HGB 13.9 02/14/2024   HCT 43.6 02/14/2024   MCV 93.2 02/14/2024   MCH 29.7 02/14/2024   RDW 13.8 02/14/2024   PLT 186 02/14/2024  Last metabolic panel Lab Results  Component Value Date   GLUCOSE 72 02/14/2024   NA 144 02/14/2024   K 4.2 02/14/2024   CL 108 02/14/2024   CO2 27 02/14/2024   BUN 20 02/14/2024   CREATININE 0.96 02/14/2024   GFRNONAA 59 (L) 02/14/2024   CALCIUM 9.3 02/14/2024   PHOS 4.5 04/18/2022   PROT 6.5 02/14/2024   ALBUMIN  4.3 02/14/2024   LABGLOB 1.9 11/22/2023   BILITOT 0.4 02/14/2024   ALKPHOS 100 02/14/2024   AST 19 02/14/2024   ALT 14 02/14/2024   ANIONGAP 9 02/14/2024   Last lipids Lab Results  Component Value Date   CHOL 149 11/22/2023   HDL 54 11/22/2023   LDLCALC 76 11/22/2023   TRIG 105 11/22/2023   CHOLHDL 2.8 11/22/2023   Last hemoglobin A1c Lab Results  Component Value Date   HGBA1C 5.5 05/17/2023      The ASCVD Risk score (Arnett DK, et al., 2019) failed to calculate for the following reasons:   The 2019 ASCVD risk score is only valid for ages 46 to 43    Assessment & Plan:   Assessment and Plan    Chronic pain and medication management Chronic pain likely related to arthritis. Gabapentin  used occasionally for sleep and pain management. No current neuropathic pain reported. Gabapentin  use for sleep is not ideal due to potential side effects including increased risk of falls and memory concerns. - Refilled gabapentin  for pain management, advised to use primarily for pain rather than sleep. - Advised caution with gabapentin  use due to potential side effects including increased risk of falls and memory concerns.  Essential hypertension Chronic, stable. Goal is to maintain blood pressure less than 140/90 mmHg to prevent exacerbation of dizziness. - continue current regimen- lopressor  25 mg - Monitor blood pressure  at home.  Chronic Vertigo Chronic issue, stable. Dizziness noted- hx of brain injury as suspected cause. - Continue to monitor blood pressure to prevent exacerbation of dizziness. - Continue diazepam  as needed  Chronic kidney disease stage 3a Chronic, stable. Kidney function is well-managed based on recent labs. - avoid NSAIDs and unecessary contrast - Not currently on any true renally protective medications  Mixed hyperlipidemia Cholesterol levels are well-managed based on recent labs.  Vitamin D  deficiency Vitamin D  supplementation is inconsistent. - Will recheck vitamin D  levels next year.  History of pulmonary embolism and Afib on Eliquis  Continues on Eliquis  for anticoagulation. No recent issues reported. Normal pulse on exam.  - Continue Eliquis  as prescribed.   GERD Chronic, stable on protonix  40 mg. Continue protonix .     IDA Continue to follow with Heme/onc. Currently stable based on last labs.  Return in about 3 months (around 05/21/2024) for 3 month follow up.    Paulette Rockford T Latanya Hemmer, PA-C

## 2024-02-21 NOTE — Patient Instructions (Signed)
 It was nice to see you today!  As we discussed in clinic:  I have sent in gabapentin  for you to take- I would advise you to take this for pain rather than for sleep.  If you have any problems before your next visit feel free to message me via MyChart (minor issues or questions) or call the office, otherwise you may reach out to schedule an office visit.  Thank you! Jahnay Lantier, PA-C

## 2024-03-13 DIAGNOSIS — R42 Dizziness and giddiness: Secondary | ICD-10-CM

## 2024-03-20 ENCOUNTER — Ambulatory Visit: Payer: Self-pay | Admitting: *Deleted

## 2024-03-20 ENCOUNTER — Ambulatory Visit (HOSPITAL_BASED_OUTPATIENT_CLINIC_OR_DEPARTMENT_OTHER): Admitting: Student

## 2024-03-20 ENCOUNTER — Encounter (HOSPITAL_BASED_OUTPATIENT_CLINIC_OR_DEPARTMENT_OTHER): Payer: Self-pay | Admitting: Student

## 2024-03-20 ENCOUNTER — Ambulatory Visit (HOSPITAL_BASED_OUTPATIENT_CLINIC_OR_DEPARTMENT_OTHER)
Admission: RE | Admit: 2024-03-20 | Discharge: 2024-03-20 | Disposition: A | Discharge status: Home / Self Care | Source: Ambulatory Visit | Attending: Student | Admitting: Student

## 2024-03-20 VITALS — BP 145/78 | HR 90 | Temp 97.9°F | Resp 16 | Ht 63.0 in | Wt 192.0 lb

## 2024-03-20 DIAGNOSIS — I480 Paroxysmal atrial fibrillation: Secondary | ICD-10-CM | POA: Diagnosis not present

## 2024-03-20 DIAGNOSIS — Z86711 Personal history of pulmonary embolism: Secondary | ICD-10-CM | POA: Diagnosis not present

## 2024-03-20 DIAGNOSIS — R42 Dizziness and giddiness: Secondary | ICD-10-CM | POA: Diagnosis not present

## 2024-03-20 DIAGNOSIS — S069XAS Unspecified intracranial injury with loss of consciousness status unknown, sequela: Secondary | ICD-10-CM

## 2024-03-20 DIAGNOSIS — R0602 Shortness of breath: Secondary | ICD-10-CM | POA: Diagnosis not present

## 2024-03-20 NOTE — Telephone Encounter (Signed)
 FYI Only or Action Required?: FYI only for provider: appointment scheduled on 03/20/24.  Patient was last seen in primary care on 02/21/2024 by Rothfuss, Lang DASEN, PA-C.  Called Nurse Triage reporting Dizziness.  Symptoms began several weeks ago.  Interventions attempted: Other: did not report any interventions.  Symptoms are: gradually worsening.  Triage Disposition: See HCP Within 4 Hours (Or PCP Triage)  Patient/caregiver understands and will follow disposition?: Yes    Please advise for earlier appt today . Patient on wait list. Recommended patient to have someone else drive her to appt.               Copied from CRM [id number]. Topic: Clinical - Red Word Triage >> Mar 20, 2024  7:41 AM Ahlexyia S wrote: Red Word that prompted transfer to Nurse Triage: Pt called in stating that she is really dizzy and have to hold on to her walker or something to give her balance. Pt denied having any other symptoms. Warm transferred to nurse triage. Reason for Disposition  [1] Dizziness caused by heat exposure, sudden standing, or poor fluid intake AND [2] no improvement after 2 hours of rest and fluids  Answer Assessment - Initial Assessment Questions Appt scheduled today with PCP. On wait list for earlier appt. Recommended if sx worsen go to ED or call 911. Patient very persistent to come in to office this am and wait on an appt. Patient finally agreed to schedule appt today. Recommended patient could contact her oncologist to see if they could see her today. Please advise patient reports if office can call her back, due to she will recognize the # as she receives multiple robo  calls.      1. DESCRIPTION: Describe your dizziness.     lightheaded 2. LIGHTHEADED: Do you feel lightheaded? (e.g., somewhat faint, woozy, weak upon standing)     Weak upon standing has to hold on to something 3. VERTIGO: Do you feel like either you or the room is spinning or tilting? (i.e.,  vertigo)     Reports hx of room spinning 4. SEVERITY: How bad is it?  Do you feel like you are going to faint? Can you stand and walk?     Can stand and walk but has to hold on to walker or something else to walk at times 5. ONSET:  When did the dizziness begin?     3- 4 weeks  6. AGGRAVATING FACTORS: Does anything make it worse? (e.g., standing, change in head position)     Standing. Sitting makes dizziness better 7. HEART RATE: Can you tell me your heart rate? How many beats in 15 seconds?  (Note: Not all patients can do this.)       No , does not elevate with standing  8. CAUSE: What do you think is causing the dizziness? (e.g., decreased fluids or food, diarrhea, emotional distress, heat exposure, new medicine, sudden standing, vomiting; unknown)     Low iron  9. RECURRENT SYMPTOM: Have you had dizziness before? If Yes, ask: When was the last time? What happened that time?     Yes iron  levels low  10. OTHER SYMPTOMS: Do you have any other symptoms? (e.g., fever, chest pain, vomiting, diarrhea, bleeding)       SOB with exertion , no nausea no vomiting. When standing dizziness but sitting dizziness decreases. Denies chest pain no headache no fever, no weakness either side of body. No confusion no slurred speech.  11. PREGNANCY: Is there any chance you  are pregnant? When was your last menstrual period?       na  Protocols used: Dizziness - Lightheadedness-A-AH

## 2024-03-20 NOTE — Patient Instructions (Addendum)
 It was nice to see you today!  As we discussed in clinic:  - Please schedule an appointment with your cardiologist immediately to make sure everything is going okay with your heart.  - If any of your symptoms worsen or you get any new symptoms alongside your shortness of breath, I need you to go to the ER immediately  If you have any problems before your next visit feel free to message me via MyChart (minor issues or questions) or call the office, otherwise you may reach out to schedule an office visit.  Thank you! Naylani Bradner, PA-C

## 2024-03-20 NOTE — Progress Notes (Unsigned)
 "  Acute Office Visit  Subjective:     Patient ID: Lori [name], female    DOB: 09/30/1940, 84 y.o.   MRN: [id number]  Chief Complaint  Patient presents with   Dizziness    Pt states that she is really dizzy for the past month and have to hold on to her walker or something to give her balance. Pt denied having any other symptoms. Last time this happened her PCP sent her to the ED due to anemia and got fluids.    HPI  Discussed the use of AI scribe software for clinical note transcription with the patient, who gave verbal consent to proceed.  History of Present Illness   Lori [name] is an 84 year old female with a history of pulmonary embolism on Eliquis  who presents with worsening dizziness and shortness of breath on exertion. She was brought to the visit by an 23 year old friend.  She has been experiencing dizziness and shortness of breath for over a month, with symptoms progressively worsening. The dizziness is described as a lightheaded feeling, particularly when standing or walking, and she feels like she might 'pass out' if she does not sit down. No falls or injuries. No blood tinged bodily fluids noted. No chest pain, numbness, or tingling is reported.  Her shortness of breath occurs primarily with exertion, such as walking across a room, and is absent while sitting or lying down. She denies any history of heart failure or chronic bronchitis, although she has had bronchitis in the past. She also reports nighttime throat drainage, which requires her to cough upon waking.  She has a history of a blood clot in her lung several years ago, for which she was hospitalized for three days. Since then, she has been on Eliquis  and has not missed any doses. She denies any swelling in her legs.  Socially, she lives alone and feels isolated, with her son away and limited contact with her sister. She relies on an elderly friend for transportation to medical appointments. She is concerned about her  ability to care for her pets if she were to be hospitalized.   DENIED ER STATING SHE WOULD GO HOME IF SENT.     ROS Per HPI     Objective:    BP (!) 145/78   Pulse 90   Temp 97.9 F (36.6 C) (Oral)   Resp 16   Ht 5' 3 (1.6 m)   Wt 192 lb (87.1 kg)   SpO2 98%   BMI 34.01 kg/m  BP Readings from Last 3 Encounters:  03/20/24 (!) 145/78  02/21/24 114/73  02/14/24 (!) 141/89   Wt Readings from Last 3 Encounters:  03/20/24 192 lb (87.1 kg)  02/21/24 186 lb 14.4 oz (84.8 kg)  02/14/24 186 lb 1.6 oz (84.4 kg)   SpO2 Readings from Last 3 Encounters:  03/20/24 98%  02/21/24 97%  02/14/24 100%      Physical Exam Constitutional:      General: She is not in acute distress.    Appearance: Normal appearance. She is not ill-appearing, toxic-appearing or diaphoretic.  HENT:     Head: Normocephalic and atraumatic.     Nose: Nose normal.  Eyes:     General: No scleral icterus.    Conjunctiva/sclera: Conjunctivae normal.  Neck:     Comments: No major jvd Cardiovascular:     Rate and Rhythm: Normal rate. Rhythm irregular.     Heart sounds: Normal heart sounds. No murmur heard.  No friction rub.  Pulmonary:     Effort: Pulmonary effort is normal. No respiratory distress.     Breath sounds: Normal breath sounds. No wheezing, rhonchi or rales.  Musculoskeletal:        General: Normal range of motion.     Right lower leg: Edema (3+ pitting) present.     Left lower leg: Edema (3+ pitting) present.  Skin:    General: Skin is warm and dry.     Coloration: Skin is not jaundiced or pale.  Neurological:     General: No focal deficit present.     Mental Status: She is alert. Mental status is at baseline.     Motor: No weakness.  Psychiatric:        Mood and Affect: Mood normal.        Behavior: Behavior normal.     Results for orders placed or performed in visit on 03/20/24  CBC with Differential/Platelet  Result Value Ref Range   WBC 5.8 3.4 - 10.8 x10E3/uL   RBC 3.14  (L) 3.77 - 5.28 x10E6/uL   Hemoglobin 8.3 (L) 11.1 - 15.9 g/dL   Hematocrit 72.1 (L) 65.9 - 46.6 %   MCV 89 79 - 97 fL   MCH 26.4 (L) 26.6 - 33.0 pg   MCHC 29.9 (L) 31.5 - 35.7 g/dL   RDW 84.1 (H) 88.2 - 84.5 %   Platelets 189 150 - 450 x10E3/uL   Neutrophils 70 Not Estab. %   Lymphs 17 Not Estab. %   Monocytes 11 Not Estab. %   Eos 1 Not Estab. %   Basos 1 Not Estab. %   Neutrophils Absolute 4.0 1.4 - 7.0 x10E3/uL   Lymphocytes Absolute 1.0 0.7 - 3.1 x10E3/uL   Monocytes Absolute 0.7 0.1 - 0.9 x10E3/uL   EOS (ABSOLUTE) 0.1 0.0 - 0.4 x10E3/uL   Basophils Absolute 0.1 0.0 - 0.2 x10E3/uL   Immature Granulocytes 0 Not Estab. %   Immature Grans (Abs) 0.0 0.0 - 0.1 x10E3/uL  Comprehensive metabolic panel with GFR  Result Value Ref Range   Glucose 109 (H) 70 - 99 mg/dL   BUN 24 8 - 27 mg/dL   Creatinine, Ser 9.02 0.57 - 1.00 mg/dL   eGFR 59 (L) >40 fO/fpw/8.26   BUN/Creatinine Ratio 25 12 - 28   Sodium 144 134 - 144 mmol/L   Potassium 4.8 3.5 - 5.2 mmol/L   Chloride 109 (H) 96 - 106 mmol/L   CO2 26 20 - 29 mmol/L   Calcium 8.8 8.7 - 10.3 mg/dL   Total Protein 5.9 (L) 6.0 - 8.5 g/dL   Albumin  4.1 3.7 - 4.7 g/dL   Globulin, Total 1.8 1.5 - 4.5 g/dL   Bilirubin Total <9.7 0.0 - 1.2 mg/dL   Alkaline Phosphatase 132 (H) 48 - 129 IU/L   AST 38 0 - 40 IU/L   ALT 57 (H) 0 - 32 IU/L  D-dimer, quantitative  Result Value Ref Range   D-DIMER 0.40 0.00 - 0.49 mg/L FEU  Brain natriuretic peptide  Result Value Ref Range   BNP 215.4 (H) 0.0 - 100.0 pg/mL        Assessment & Plan:   Assessment and Plan    Lightheadedness and shortness of breath on exertion Acute issue that is a risk to life or limb. 1 month history of shortness of breath that is worsening. Shortness of breath on exertion with concern for heart failure indicated with pretty significant pitting edema (3+). Differential includes  anemia and heart failure. Labs and imaging needed to rule out anemia and structural heart  issues. Patient was non toxic today and appeared stable to go home.  - Ordered CBC, CMP, D-dimer, and BNP - Ordered chest x-ray - Referred to cardiology for afib follow up due to lightheadedness and needs a renewed echo - DENIED ER  Paroxysmal atrial fibrillation Currently in atrial fibrillation with no tachycardia, possibly contributing to dizziness and shortness of breath on exertion. Concern that afib may have gone from paroxysmal to chronic. EKG shows atrial fibrillation. Possible JVD and pitting edema elevate concerns for heart failure.   - Ordered EKG- Afib, low voltage QRS otherwise unremarkable. - Referred to cardiology for further evaluation and management  Chronic vertigo Current symptoms differ from usual vertigo, characterized by lightheadedness and near syncope upon standing, not present when sitting or lying down.  History of pulmonary embolism Currently on Eliquis  with no missed doses. No current symptoms suggestive of recurrent embolism. - Continue Eliquis  as prescribed, low suspicion for clot but will order D-Dimer.     UPDATE: - Called patient with low hgb of 8 via stat results on 03/21/23 evening. Since this has been going on for 1 month and she has not seen any black tarry stools, red stools, bloody vomit, abdominal pain, or blood in her urination I believe we can assess in the morning if she can meet with hematology or needs to go to the ER. If it has been a month, we can likely reassess. - Talked to her this morning as well and things have not worsened or changed. Continues to deny ER. - Patient is aware if things get any worse she will need to go straight to the ER.  Return if symptoms worsen or fail to improve.  Jacere Pangborn T Mairely Foxworth, PA-C  "

## 2024-03-21 ENCOUNTER — Telehealth (HOSPITAL_BASED_OUTPATIENT_CLINIC_OR_DEPARTMENT_OTHER): Payer: Self-pay

## 2024-03-21 ENCOUNTER — Telehealth (HOSPITAL_BASED_OUTPATIENT_CLINIC_OR_DEPARTMENT_OTHER): Payer: Self-pay | Admitting: Student

## 2024-03-21 ENCOUNTER — Telehealth: Payer: Self-pay

## 2024-03-21 ENCOUNTER — Other Ambulatory Visit: Payer: Self-pay

## 2024-03-21 DIAGNOSIS — D649 Anemia, unspecified: Secondary | ICD-10-CM

## 2024-03-21 DIAGNOSIS — D500 Iron deficiency anemia secondary to blood loss (chronic): Secondary | ICD-10-CM

## 2024-03-21 LAB — COMPREHENSIVE METABOLIC PANEL WITH GFR
ALT: 57 IU/L — ABNORMAL HIGH (ref 0–32)
AST: 38 IU/L (ref 0–40)
Albumin: 4.1 g/dL (ref 3.7–4.7)
Alkaline Phosphatase: 132 IU/L — ABNORMAL HIGH (ref 48–129)
BUN/Creatinine Ratio: 25 (ref 12–28)
BUN: 24 mg/dL (ref 8–27)
Bilirubin Total: <0.2 mg/dL (ref 0.0–1.2)
CO2: 26 mmol/L (ref 20–29)
Calcium: 8.8 mg/dL (ref 8.7–10.3)
Chloride: 109 mmol/L — ABNORMAL HIGH (ref 96–106)
Creatinine, Ser: 0.97 mg/dL (ref 0.57–1.00)
Globulin, Total: 1.8 g/dL (ref 1.5–4.5)
Glucose: 109 mg/dL — ABNORMAL HIGH (ref 70–99)
Potassium: 4.8 mmol/L (ref 3.5–5.2)
Sodium: 144 mmol/L (ref 134–144)
Total Protein: 5.9 g/dL — ABNORMAL LOW (ref 6.0–8.5)
eGFR: 59 mL/min/1.73 — ABNORMAL LOW

## 2024-03-21 LAB — CBC WITH DIFFERENTIAL/PLATELET
Basophils Absolute: 0.1 x10E3/uL (ref 0.0–0.2)
Basos: 1 %
EOS (ABSOLUTE): 0.1 x10E3/uL (ref 0.0–0.4)
Eos: 1 %
Hematocrit: 27.8 % — ABNORMAL LOW (ref 34.0–46.6)
Hemoglobin: 8.3 g/dL — ABNORMAL LOW (ref 11.1–15.9)
Immature Grans (Abs): 0 x10E3/uL (ref 0.0–0.1)
Immature Granulocytes: 0 %
Lymphocytes Absolute: 1 x10E3/uL (ref 0.7–3.1)
Lymphs: 17 %
MCH: 26.4 pg — ABNORMAL LOW (ref 26.6–33.0)
MCHC: 29.9 g/dL — ABNORMAL LOW (ref 31.5–35.7)
MCV: 89 fL (ref 79–97)
Monocytes Absolute: 0.7 x10E3/uL (ref 0.1–0.9)
Monocytes: 11 %
Neutrophils Absolute: 4 x10E3/uL (ref 1.4–7.0)
Neutrophils: 70 %
Platelets: 189 x10E3/uL (ref 150–450)
RBC: 3.14 x10E6/uL — ABNORMAL LOW (ref 3.77–5.28)
RDW: 15.8 % — ABNORMAL HIGH (ref 11.7–15.4)
WBC: 5.8 x10E3/uL (ref 3.4–10.8)

## 2024-03-21 LAB — D-DIMER, QUANTITATIVE: D-DIMER: 0.4 mg{FEU}/L (ref 0.00–0.49)

## 2024-03-21 LAB — BRAIN NATRIURETIC PEPTIDE: BNP: 215.4 pg/mL — ABNORMAL HIGH (ref 0.0–100.0)

## 2024-03-21 NOTE — Telephone Encounter (Signed)
 Copied from CRM [id number]. Topic: Clinical - Red Word Triage >> Mar 20, 2024  7:41 AM Ahlexyia S wrote: Red Word that prompted transfer to Nurse Triage: Pt called in stating that she is really dizzy and have to hold on to her walker or something to give her balance. Pt denied having any other symptoms. Warm transferred to nurse triage. >> Mar 21, 2024  1:06 PM Drema MATSU wrote: Pt is calling to follow up on request. Advised of message.

## 2024-03-21 NOTE — Progress Notes (Signed)
 I received a message from Jenkins Mannheim PCP noting that Anastasya has experienced a sudden hemoglobin decline from what was 13.9 on her December 1 follow-up with us  to in the lower eights yesterday with her PCP.  Her PCP recommended that she go to the emergency room and she declined.  She is aware of the risks in doing so.  I called the patient and talked to her today.  She continues to decline to go to the emergency room.  She verbalized understanding of the risk in doing so.  She would like to come in to receive a transfusion here at the cancer center.  I reviewed her case with Dr. Gudena who is in agreement with the recommendation for emergency room however with patient refusal to go order labs and transfusion to be completed here at the cancer center.  The earliest our infusion room can get her in we will be on January 8, per Rocky Saucier, RN.  I sent a request to Andrea Gosling our nurse manager to see if she could find any wiggle room in the infusion room schedule.  I again reviewed with the patient my concern of her waiting to receive the blood transfusion until then.  I instructed her to stop Eliquis .  I instructed her to take her Protonix  twice a day.  She told me if she begins to feel worse she will consider going to the emergency room but for right now she feels good enough to stay at home.  She is alert and oriented x 4 with normal mood and behavior today.  Will reach out to Dr. Federico who is her last gastroenterologist on the record.  This was reviewed with Dr. Gudena in its entirety who is in agreement with the assessment and plan.  Morna Kendall, NP 03/21/2024 2:36 PM Medical Oncology and Hematology [facility] [address] [place], [place] [address] Tel. [phone]    Fax. [phone]

## 2024-03-21 NOTE — Telephone Encounter (Signed)
 Per PCP: Good morning! I could not get through on the phone. I saw this shared patient (IDA) yesterday for some lightheadedness and shortness of breath, checked a CBC and got stat results back (still waiting to load into epic) that her hgb was in the low 8's, workup otherwise is unremarkable thus far aside from some known afib. Would you be able to see her in clinic about this soon or would you prefer I send her elsewhere since it is much worse than 02/14/24?    Spoke to [facility], they said with her sxs pt should have been sent to ER. I explained she did not want to go yesterday. They are sending an urgent message to Morna Kendall, NP

## 2024-03-21 NOTE — Telephone Encounter (Signed)
"  error  "

## 2024-03-22 ENCOUNTER — Telehealth: Payer: Self-pay

## 2024-03-22 ENCOUNTER — Ambulatory Visit (HOSPITAL_BASED_OUTPATIENT_CLINIC_OR_DEPARTMENT_OTHER): Payer: Self-pay | Admitting: Student

## 2024-03-22 DIAGNOSIS — H9190 Unspecified hearing loss, unspecified ear: Secondary | ICD-10-CM | POA: Insufficient documentation

## 2024-03-22 DIAGNOSIS — R0600 Dyspnea, unspecified: Secondary | ICD-10-CM | POA: Insufficient documentation

## 2024-03-22 DIAGNOSIS — I499 Cardiac arrhythmia, unspecified: Secondary | ICD-10-CM | POA: Insufficient documentation

## 2024-03-22 DIAGNOSIS — I2699 Other pulmonary embolism without acute cor pulmonale: Secondary | ICD-10-CM | POA: Insufficient documentation

## 2024-03-22 DIAGNOSIS — R0602 Shortness of breath: Secondary | ICD-10-CM | POA: Insufficient documentation

## 2024-03-22 DIAGNOSIS — Z8711 Personal history of peptic ulcer disease: Secondary | ICD-10-CM | POA: Insufficient documentation

## 2024-03-22 DIAGNOSIS — D125 Benign neoplasm of sigmoid colon: Secondary | ICD-10-CM | POA: Insufficient documentation

## 2024-03-22 NOTE — Telephone Encounter (Signed)
 Spoke with pt and she is scheduled to see Dr. Federico 03/27/24 at 1:30pm. Pt aware of appt and states she will do her best to be here.

## 2024-03-22 NOTE — Telephone Encounter (Signed)
-----   Message from Rosario Kidney, MD sent at 03/21/2024  4:19 PM EST ----- I agree that she needs to be seen soon. I can get her into my clinic next week if she is amenable.  Pod B triage, let's see if she can come into clinic to see me next week for her anemia.  Thanks ----- Message ----- From: Crawford Morna Pickle, NP Sent: 03/21/2024   4:06 PM EST To: Rosario JAYSON Kidney, MD  Hey there,  I am reaching out about our mutual patient who has had a hemoglobin decline yet refuses to go to the emergency room.  According to her primary care provider her hemoglobin yesterday afternoon was around 8.  The labs have yet to be scanned into epic.  Her hemoglobin with me 1 month ago was around 13.  Considering the rapid decline and her age and comorbidities and the fact that she is symptomatic we recommended that she go to the emergency room which she declined.  The earliest I can get her in for blood transfusion will be on Thursday.  I reviewed this with Dr. Gudena.  We recommended that she stop her Eliquis  and increase her Protonix  to twice a day.  I will repeat iron  studies on Thursday when she comes in to receive the transfusion.  Wanted to loop you in and see if/when you might be able to see her.    Thanks, LC

## 2024-03-23 ENCOUNTER — Inpatient Hospital Stay: Attending: Adult Health

## 2024-03-23 ENCOUNTER — Other Ambulatory Visit (HOSPITAL_BASED_OUTPATIENT_CLINIC_OR_DEPARTMENT_OTHER): Payer: Self-pay | Admitting: Student

## 2024-03-23 ENCOUNTER — Encounter: Payer: Self-pay | Admitting: Hematology and Oncology

## 2024-03-23 ENCOUNTER — Other Ambulatory Visit: Payer: Self-pay | Admitting: Adult Health

## 2024-03-23 DIAGNOSIS — D500 Iron deficiency anemia secondary to blood loss (chronic): Secondary | ICD-10-CM

## 2024-03-23 DIAGNOSIS — D649 Anemia, unspecified: Secondary | ICD-10-CM

## 2024-03-23 DIAGNOSIS — D508 Other iron deficiency anemias: Secondary | ICD-10-CM | POA: Insufficient documentation

## 2024-03-23 DIAGNOSIS — R42 Dizziness and giddiness: Secondary | ICD-10-CM

## 2024-03-23 LAB — CBC WITH DIFFERENTIAL (CANCER CENTER ONLY)
Abs Immature Granulocytes: 0.04 K/uL (ref 0.00–0.07)
Basophils Absolute: 0.1 K/uL (ref 0.0–0.1)
Basophils Relative: 1 %
Eosinophils Absolute: 0.1 K/uL (ref 0.0–0.5)
Eosinophils Relative: 1 %
HCT: 29 % — ABNORMAL LOW (ref 36.0–46.0)
Hemoglobin: 8.6 g/dL — ABNORMAL LOW (ref 12.0–15.0)
Immature Granulocytes: 1 %
Lymphocytes Relative: 15 %
Lymphs Abs: 1 K/uL (ref 0.7–4.0)
MCH: 25.8 pg — ABNORMAL LOW (ref 26.0–34.0)
MCHC: 29.7 g/dL — ABNORMAL LOW (ref 30.0–36.0)
MCV: 87.1 fL (ref 80.0–100.0)
Monocytes Absolute: 0.7 K/uL (ref 0.1–1.0)
Monocytes Relative: 11 %
Neutro Abs: 4.5 K/uL (ref 1.7–7.7)
Neutrophils Relative %: 71 %
Platelet Count: 232 K/uL (ref 150–400)
RBC: 3.33 MIL/uL — ABNORMAL LOW (ref 3.87–5.11)
RDW: 16.5 % — ABNORMAL HIGH (ref 11.5–15.5)
WBC Count: 6.3 K/uL (ref 4.0–10.5)
nRBC: 0 % (ref 0.0–0.2)

## 2024-03-23 LAB — RETIC PANEL
Immature Retic Fract: 25.8 % — ABNORMAL HIGH (ref 2.3–15.9)
RBC.: 3.3 MIL/uL — ABNORMAL LOW (ref 3.87–5.11)
Retic Count, Absolute: 67 K/uL (ref 19.0–186.0)
Retic Ct Pct: 2 % (ref 0.4–3.1)
Reticulocyte Hemoglobin: 16.9 pg — ABNORMAL LOW

## 2024-03-23 LAB — CMP (CANCER CENTER ONLY)
ALT: 85 U/L — ABNORMAL HIGH (ref 0–44)
AST: 57 U/L — ABNORMAL HIGH (ref 15–41)
Albumin: 4.4 g/dL (ref 3.5–5.0)
Alkaline Phosphatase: 142 U/L — ABNORMAL HIGH (ref 38–126)
Anion gap: 10 (ref 5–15)
BUN: 26 mg/dL — ABNORMAL HIGH (ref 8–23)
CO2: 25 mmol/L (ref 22–32)
Calcium: 9.1 mg/dL (ref 8.9–10.3)
Chloride: 109 mmol/L (ref 98–111)
Creatinine: 1 mg/dL (ref 0.44–1.00)
GFR, Estimated: 56 mL/min — ABNORMAL LOW
Glucose, Bld: 149 mg/dL — ABNORMAL HIGH (ref 70–99)
Potassium: 4.6 mmol/L (ref 3.5–5.1)
Sodium: 144 mmol/L (ref 135–145)
Total Bilirubin: 0.3 mg/dL (ref 0.0–1.2)
Total Protein: 6.4 g/dL — ABNORMAL LOW (ref 6.5–8.1)

## 2024-03-23 LAB — IRON AND IRON BINDING CAPACITY (CC-WL,HP ONLY)
Iron: 25 ug/dL — ABNORMAL LOW (ref 28–170)
Saturation Ratios: 5 % — ABNORMAL LOW (ref 10.4–31.8)
TIBC: 480 ug/dL — ABNORMAL HIGH (ref 250–450)
UIBC: 455 ug/dL

## 2024-03-23 LAB — VITAMIN B12: Vitamin B-12: 2569 pg/mL — ABNORMAL HIGH (ref 180–914)

## 2024-03-23 LAB — FERRITIN: Ferritin: 10 ng/mL — ABNORMAL LOW (ref 11–307)

## 2024-03-23 LAB — PREPARE RBC (CROSSMATCH)

## 2024-03-23 LAB — SAMPLE TO BLOOD BANK

## 2024-03-23 MED ORDER — ACETAMINOPHEN 325 MG PO TABS
650.0000 mg | ORAL_TABLET | Freq: Once | ORAL | Status: AC
  Administered 2024-03-23: 650 mg via ORAL
  Filled 2024-03-23: qty 2

## 2024-03-23 MED ORDER — DIPHENHYDRAMINE HCL 25 MG PO CAPS
25.0000 mg | ORAL_CAPSULE | Freq: Once | ORAL | Status: AC
  Administered 2024-03-23: 25 mg via ORAL
  Filled 2024-03-23: qty 1

## 2024-03-23 MED ORDER — SODIUM CHLORIDE 0.9% IV SOLUTION
250.0000 mL | INTRAVENOUS | Status: DC
  Administered 2024-03-23: 250 mL via INTRAVENOUS

## 2024-03-23 NOTE — Patient Instructions (Signed)

## 2024-03-24 ENCOUNTER — Other Ambulatory Visit: Payer: Self-pay | Admitting: Adult Health

## 2024-03-24 ENCOUNTER — Ambulatory Visit

## 2024-03-24 VITALS — BP 142/72 | HR 97 | Ht 65.0 in | Wt 197.0 lb

## 2024-03-24 DIAGNOSIS — R0602 Shortness of breath: Secondary | ICD-10-CM

## 2024-03-24 DIAGNOSIS — I482 Chronic atrial fibrillation, unspecified: Secondary | ICD-10-CM | POA: Diagnosis not present

## 2024-03-24 LAB — TYPE AND SCREEN
ABO/RH(D): A POS
Antibody Screen: NEGATIVE
Unit division: 0

## 2024-03-24 LAB — BPAM RBC
Blood Product Expiration Date: 202602072359
ISSUE DATE / TIME: 202601081530
Unit Type and Rh: 6200

## 2024-03-24 MED ORDER — FUROSEMIDE 20 MG PO TABS: 20.0000 mg | ORAL_TABLET | ORAL | 3 refills | Status: AC

## 2024-03-24 NOTE — Progress Notes (Signed)
 "  Cardiology Consultation:    Date:  03/24/2024   ID:  Lori [name], DOB 09-08-1940, MRN [id number]  PCP:  Iven Lang DASEN, PA-C  Cardiologist:  Alean SAUNDERS Alaynah Schutter, MD   Referring MD: Iven Lang DASEN, PA-C   No chief complaint on file.    ASSESSMENT AND PLAN:   [name] 84 year old woman with  history of paroxysmal atrial fibrillation diagnosed in January 2019, pulmonary embolism in 2019, GI bleed severe esophagitis and gastritis secondary to NSAID use in the past, cerebral aneurysm s/p coiling in 2001, hypertension, hyperlipidemia, CKD stage III, chronic vertigo, chronic dyspnea. Last echocardiogram to review is from September 2023 with LVEF 60 to 65%, grade 2 diastolic dysfunction, dilated left atrium and right atrium, trace MR, moderate TR with RVSP 41 mmHg. Anemia which is relatively new [hemoglobin drop from 13.9 down to 8.3 in the past month without any active bleeding reported by patient], received PRBC transfusion yesterday 03/23/2024.  And now plan for further iron  infusions. Is pending GI visit on Monday next week.   Here for further evaluation Problem List Items Addressed This Visit       Cardiovascular and Mediastinum   Atrial fibrillation ([name])   History of atrial fibrillation since 2019. Has been on anticoagulation with Eliquis  5 mg twice daily.  Currently in A-fib, rates controlled. She is relatively asymptomatic with regards to palpitations. Shortness of breath could be related to A-fib with elevated heart rates at baseline.  Continue metoprolol  tartrate 25 mg twice daily.  Will obtain Zio patch for 14 days to assess her A-fib burden and rate control.       Relevant Medications   furosemide  (LASIX ) 20 MG tablet   Other Relevant Orders   LONG TERM MONITOR (3-14 DAYS)     Other   SOBOE (shortness of breath on exertion) - Primary   Appears more related to her anemia. She however has baseline shortness of breath on exertion chronically.  Other  differential for her shortness of breath includes deconditioning versus pulmonary causes versus diastolic CHF versus tachycardia with underlying atrial fibrillation.  Continue management of anemia as per PCP and hematology and GI service. Anticipate she will require further evaluation for GI causes of anemia given her history of gastritis and esophagitis.  She has a visit coming up with gastroenterologist on Monday. From cardiac standpoint okay to proceed with EGD or colonoscopy as needed. Okay to hold Eliquis  as needed.  Obtain Zio patch 14-day study to rule out any significant tacky or bradycardia arrhythmia contributing to her symptoms.  Will obtain transthoracic echocardiogram to rule out any significant cardiac structural and functional changes.  Advised her to keep salt intake down less than 2 g/day. Will add Lasix  20 mg to take every other day.  Return for follow-up tentatively in 1 month.       Relevant Orders   EKG 12-Lead (Completed)   ECHOCARDIOGRAM COMPLETE   LONG TERM MONITOR (3-14 DAYS)   Majority of visit today spent discussing shortness of breath and, anemia concerns and A-fib. At subsequent follow-up visit in 1 month we will review her other chronic health concerns pertaining to hypertension and hyperlipidemia.  Return to clinic in 4 weeks.   History of Present Illness:    Lori [name] is a 84 y.o. female who is being seen today for shortness of breath, lightheaded and atrial fibrillation evaluation. PCP is Rothfuss, Jacob T, PA-C. Previously followed up with cardiologist Dr. Ladona at [facility], last visit  10/03/2021.  Pleasant woman here for the visit by herself.  Lives by herself at home.  Has a dog.  Drives herself.  Able to do her activities without any significant limitations.  history of paroxysmal atrial fibrillation diagnosed in January 2019, pulmonary embolism in 2019, GI bleed severe esophagitis and gastritis secondary to NSAID  use in the past, cerebral aneurysm s/p coiling in 2001, hypertension, hyperlipidemia, CKD stage III, chronic vertigo, chronic dyspnea. Last echocardiogram to review is from September 2023 with LVEF 60 to 65%, grade 2 diastolic dysfunction, dilated left atrium and right atrium, trace MR, moderate TR with RVSP 41 mmHg. Anemia which is relatively new [hemoglobin drop from 13.9 down to 8.3 in the past month without any active bleeding reported by patient], received PRBC transfusion yesterday 03/23/2024.  And now plan for further iron  infusions. Is pending GI visit on Monday next week.  Labs from yesterday 03/23/2024 noted sodium 144, potassium 4.6 BUN 26, creatinine 1, eGFR 56. Transaminases mildly elevated AST 57 and ALT 85.  Alkaline phosphatase elevated 142. BNP elevated 215 on 03/20/2024 D-dimer was normal 0.4.  Recently at PCPs office 03/20/2024 she was noted to be significantly short of breath over the past month associated with sense of tiredness and lightheadedness. Denies any falls or syncopal episodes.  She has been significantly short of breath with exertion.  Denies any chest pain. Denies any symptoms at rest. Denies any orthopnea.  Denies any blood in urine or stools.  Denies any awareness of palpitations.  Unable to tell when she is in A-fib compared to when she is in regular rhythm.  EKG in the clinic today shows atrial fibrillation with ventricular rate 97/min, QRS duration 70 ms, QTc 424 ms.  No ischemic changes.  Similar in comparison to prior EKG from August 18, 2024.  Good compliance with her medications at home. Recently apixaban  was on hold for the last couple days and was asked to resume after her PRBC transfusion.  She denies any active blood in urine or stools.   Past Medical History:  Diagnosis Date   Anemia    Arthritis    Atrial fibrillation ([name]) 06/16/2017   Per EKG, patient declined anticoagulation citing cost     Benign neoplasm of sigmoid colon    CKD stage 3a,  GFR 45-59 ml/min ([name]) 02/21/2024   Deafness    left ear per pt    Dyspnea    pt reports with exertion due to hip and knees    Dysrhythmia    afib    Essential hypertension    Gastroesophageal reflux disease with esophagitis    Per EGD 01/2018     - Cameron erosions leading to chronic blood loss explains iron  deficiency. She could also have chronic blood loss from esophagitis. Anticipate chronic blood loss and need for repeat iron  replacement from [facility] erosions.  - Protonix  (pantoprazole ) 40 mg PO BID for 1 month then 40 mg po qam long term.     History of bleeding ulcers    2019 received 3-4 units of blood    History of pulmonary embolism (01/2018) 02/04/2018   Hyperlipidemia, mixed    Iron  deficiency anemia 04/17/2022   Iron  deficiency anemia due to chronic blood loss 03/08/2019   Secondary to esophagitis, cameron's lesions per EGD 01/2018, anticipate chronic iron  replacement needs     Medication management 11/18/2013   Obesity (BMI 30.0-34.9) 02/06/2015   Pericardial effusion 02/04/2018   Prediabetes    Primary localized osteoarthritis of right hip  09/26/2019   Pulmonary embolism ([name])    hx of 2019    Senile purpura 07/20/2017   Spinal stenosis of lumbar region 04/20/2019   Symptomatic anemia 02/04/2018   Thrombophilia 04/20/2019   Varicose vein of leg 09/01/2018   Vertigo    Vitamin D  deficiency     Past Surgical History:  Procedure Laterality Date   BIOPSY  02/05/2018   Procedure: BIOPSY;  Surgeon: Aneita Gwendlyn DASEN, MD;  Location: [facility];  Service: Endoscopy;;   CATARACT EXTRACTION W/ INTRAOCULAR LENS  IMPLANT, BILATERAL  2009   CEREBRAL ANEURYSM REPAIR     COLONOSCOPY N/A 02/05/2018   Procedure: COLONOSCOPY;  Surgeon: Aneita Gwendlyn DASEN, MD;  Location: [facility];  Service: Endoscopy;  Laterality: N/A;   ESOPHAGOGASTRODUODENOSCOPY N/A 02/05/2018   Procedure: ESOPHAGOGASTRODUODENOSCOPY (EGD);  Surgeon: Aneita Gwendlyn DASEN, MD;  Location: [facility];  Service:  Endoscopy;  Laterality: N/A;   FOOT SURGERY  1993   POLYPECTOMY  02/05/2018   Procedure: POLYPECTOMY;  Surgeon: Aneita Gwendlyn DASEN, MD;  Location: [facility];  Service: Endoscopy;;   TOTAL HIP ARTHROPLASTY Right 10/17/2019   Procedure: TOTAL HIP ARTHROPLASTY ANTERIOR APPROACH;  Surgeon: Beverley Evalene BIRCH, MD;  Location: [facility];  Service: Orthopedics;  Laterality: Right;   TUBAL LIGATION      Current Medications: Active Medications[1]   Allergies:   Patient has no known allergies.   Social History   Socioeconomic History   Marital status: Divorced    Spouse name: Not on file   Number of children: 1   Years of education: 12   Highest education level: Not on file  Occupational History   Occupation: retired  Tobacco Use   Smoking status: Former    Current packs/day: 0.00    Types: Cigarettes    Quit date: 08/07/1990    Years since quitting: 33.6    Passive exposure: Past   Smokeless tobacco: Never  Vaping Use   Vaping status: Never Used  Substance and Sexual Activity   Alcohol use: No    Alcohol/week: 0.0 standard drinks of alcohol   Drug use: No   Sexual activity: Not on file  Other Topics Concern   Not on file  Social History Narrative   Not on file   Social Drivers of Health   Tobacco Use: Medium Risk (03/24/2024)   Patient History    Smoking Tobacco Use: Former    Smokeless Tobacco Use: Never    Passive Exposure: Past  Physicist, Medical Strain: Low Risk (02/14/2024)   Overall Financial Resource Strain (CARDIA)    Difficulty of Paying Living Expenses: Not hard at all  Food Insecurity: No Food Insecurity (02/14/2024)   Epic    Worried About Radiation Protection Practitioner of Food in the Last Year: Never true    Ran Out of Food in the Last Year: Never true  Transportation Needs: No Transportation Needs (02/14/2024)   Epic    Lack of Transportation (Medical): No    Lack of Transportation (Non-Medical): No  Physical Activity: Inactive (02/14/2024)   Exercise Vital Sign    Days of  Exercise per Week: 0 days    Minutes of Exercise per Session: 0 min  Stress: No Stress Concern Present (02/14/2024)   [employer] of Occupational Health - Occupational Stress Questionnaire    Feeling of Stress: Not at all  Social Connections: Moderately Integrated (02/14/2024)   Social Connection and Isolation Panel    Frequency of Communication with Friends and Family: Three times a week  Frequency of Social Gatherings with Friends and Family: Once a week    Attends Religious Services: More than 4 times per year    Active Member of [employer] or Organizations: No    Attends Engineer, Structural: 1 to 4 times per year    Marital Status: Divorced  Recent Concern: Social Connections - Socially Isolated (12/15/2023)   Social Connection and Isolation Panel    Frequency of Communication with Friends and Family: More than three times a week    Frequency of Social Gatherings with Friends and Family: More than three times a week    Attends Religious Services: Never    Database Administrator or Organizations: No    Attends Banker Meetings: Never    Marital Status: Divorced  Depression (PHQ2-9): Low Risk (02/21/2024)   Depression (PHQ2-9)    PHQ-2 Score: 0  Alcohol Screen: Low Risk (02/14/2024)   Alcohol Screen    Last Alcohol Screening Score (AUDIT): 0  Housing: Unknown (02/14/2024)   Epic    Unable to Pay for Housing in the Last Year: No    Number of Times Moved in the Last Year: Not on file    Homeless in the Last Year: No  Utilities: Not At Risk (02/14/2024)   Epic    Threatened with loss of utilities: No  Health Literacy: Adequate Health Literacy (02/14/2024)   B1300 Health Literacy    Frequency of need for help with medical instructions: Never     Family History: The patient's family history includes Cancer in her father and mother; Diabetes in her mother; Hypertension in her mother. There is no history of Colon cancer, Stomach cancer, or Esophageal  cancer. ROS:   Please see the history of present illness.    All 14 point review of systems negative except as described per history of present illness.  EKGs/Labs/Other Studies Reviewed:    The following studies were reviewed today:   EKG:  EKG Interpretation Date/Time:  Friday March 24 2024 13:49:30 EST Ventricular Rate:  97 PR Interval:    QRS Duration:  78 QT Interval:  334 QTC Calculation: 424 R Axis:   77  Text Interpretation: Atrial fibrillation Low voltage QRS Nonspecific T wave abnormality When compared with ECG of 20-Mar-2024 16:19, No significant change was found Confirmed by Liborio Hai reddy [phone]) on 03/24/2024 2:17:32 PM    Recent Labs: 05/17/2023: TSH 3.690 03/20/2024: BNP 215.4 03/23/2024: ALT 85; BUN 26; Creatinine 1.00; Hemoglobin 8.6; Platelet Count 232; Potassium 4.6; Sodium 144  Recent Lipid Panel    Component Value Date/Time   CHOL 149 11/22/2023 1201   TRIG 105 11/22/2023 1201   HDL 54 11/22/2023 1201   CHOLHDL 2.8 11/22/2023 1201   CHOLHDL 3.4 02/16/2023 1149   VLDL 20 10/06/2016 1145   LDLCALC 76 11/22/2023 1201   LDLCALC 95 02/16/2023 1149    Physical Exam:    VS:  BP (!) 146/82   Pulse 97   Ht 5' 5 (1.651 m)   Wt 197 lb (89.4 kg)   SpO2 97%   BMI 32.78 kg/m     Wt Readings from Last 3 Encounters:  03/24/24 197 lb (89.4 kg)  03/20/24 192 lb (87.1 kg)  02/21/24 186 lb 14.4 oz (84.8 kg)     GENERAL:  Well nourished, well developed in no acute distress NECK: No JVD; No carotid bruits CARDIAC: RRR, S1 and S2 present, no murmurs, no rubs, no gallops CHEST:  Clear to auscultation without rales,  wheezing or rhonchi  Extremities: Trace bilateral pitting pedal edema. Pulses bilaterally symmetric with radial 2+ and dorsalis pedis 2+ NEUROLOGIC:  Alert and oriented x 3  Medication Adjustments/Labs and Tests Ordered: Current medicines are reviewed at length with the patient today.  Concerns regarding medicines are outlined above.   Orders Placed This Encounter  Procedures   LONG TERM MONITOR (3-14 DAYS)   EKG 12-Lead   ECHOCARDIOGRAM COMPLETE   Meds ordered this encounter  Medications   furosemide  (LASIX ) 20 MG tablet    Sig: Take 1 tablet (20 mg total) by mouth every other day.    Dispense:  90 tablet    Refill:  3    Signed, Jaysun Wessels reddy Octavia Velador, MD, MPH, [facility]. 03/24/2024 2:42 PM    [facility]     [1]  Current Meds  Medication Sig   acetaminophen  (TYLENOL ) 500 MG tablet Take 500-1,000 mg by mouth every 6 (six) hours as needed for mild pain or moderate pain.   apixaban  (ELIQUIS ) 5 MG TABS tablet Take 1 tablet (5 mg total) by mouth 2 (two) times daily.   Ascorbic Acid (VITAMIN C) 1000 MG tablet Take 1,000 mg by mouth daily.   Cholecalciferol (VITAMIN D3) 1000 units CAPS Take 1,000-4,000 Units by mouth daily.   Cyanocobalamin  (B-12) 1000 MCG CAPS Take by mouth daily.   diazepam  (VALIUM ) 5 MG tablet take ONE-HALF TO ONE tablet by MOUTH TWO TO THREE times daily as needed FOR SEVERE vertigo OR dizziness   ferrous sulfate  325 (65 FE) MG tablet Take 1 tablet (325 mg total) by mouth See admin instructions. Take 325 mg by mouth three times a week with food   furosemide  (LASIX ) 20 MG tablet Take 1 tablet (20 mg total) by mouth every other day.   gabapentin  (NEURONTIN ) 300 MG capsule Take  1 capsule  2 x day for pain.   latanoprost (XALATAN) 0.005 % ophthalmic solution Place 1 drop into both eyes at bedtime.   meclizine  (ANTIVERT ) 25 MG tablet TAKE ONE-HALF TO ONE TABLET BY MOUTH UP TO THREE TIMES DAILY FOR MOTION SICKNESS/DIZZINESSTAKE ONE-HALF TO ONE TABLET BY MOUTH UP TO THREE TIMES DAILY FOR MOTION SICKNESS/DIZZINESS   metoprolol  tartrate (LOPRESSOR ) 25 MG tablet Take 1 tablet (25 mg total) by mouth daily.   pantoprazole  (PROTONIX ) 40 MG tablet TAKE ONE TABLET BY MOUTH DAILY TO PREVENT HEARTBURN AND INDIGESTION   TURMERIC PO Take 1 capsule by mouth daily.   "

## 2024-03-24 NOTE — Assessment & Plan Note (Signed)
 History of atrial fibrillation since 2019. Has been on anticoagulation with Eliquis  5 mg twice daily.  Currently in A-fib, rates controlled. She is relatively asymptomatic with regards to palpitations. Shortness of breath could be related to A-fib with elevated heart rates at baseline.  Continue metoprolol  tartrate 25 mg twice daily.  Will obtain Zio patch for 14 days to assess her A-fib burden and rate control.

## 2024-03-24 NOTE — Patient Instructions (Signed)
 Medication Instructions:  Your physician has recommended you make the following change in your medication:   Start taking Lasix  20 mg every other day  *If you need a refill on your cardiac medications before your next appointment, please call your pharmacy*   Lab Work: None ordered If you have labs (blood work) drawn today and your tests are completely normal, you will receive your results only by: MyChart Message (if you have MyChart) OR A paper copy in the mail If you have any lab test that is abnormal or we need to change your treatment, we will call you to review the results.  Testing/Procedures: Your physician has requested that you have an echocardiogram. Echocardiography is a painless test that uses sound waves to create images of your heart. It provides your doctor with information about the size and shape of your heart and how well your hearts chambers and valves are working. This procedure takes approximately one hour. There are no restrictions for this procedure. Please do NOT wear cologne, perfume, aftershave, or lotions (deodorant is allowed). Please arrive 15 minutes prior to your appointment time.  Please note: We ask at that you not bring children with you during ultrasound (echo/ vascular) testing. Due to room size and safety concerns, children are not allowed in the ultrasound rooms during exams. Our front office staff cannot provide observation of children in our lobby area while testing is being conducted. An adult accompanying a patient to their appointment will only be allowed in the ultrasound room at the discretion of the ultrasound technician under special circumstances. We apologize for any inconvenience.  Follow-Up: At [facility], you and your health needs are our priority.  As part of our continuing mission to provide you with exceptional heart care, we have created designated Provider Care Teams.  These Care Teams include your primary Cardiologist (physician)  and Advanced Practice Providers (APPs -  Physician Assistants and Nurse Practitioners) who all work together to provide you with the care you need, when you need it.  We recommend signing up for the patient portal called MyChart.  Sign up information is provided on this After Visit Summary.  MyChart is used to connect with patients for Virtual Visits (Telemedicine).  Patients are able to view lab/test results, encounter notes, upcoming appointments, etc.  Non-urgent messages can be sent to your provider as well.   To learn more about what you can do with MyChart, go to [email or web address].    Your next appointment:   1 month(s)  The format for your next appointment:   In Person  Provider:   Alean Madireddy, MD   Other Instructions Echocardiogram An echocardiogram is a test that uses sound waves (ultrasound) to produce images of the heart. Images from an echocardiogram can provide important information about: Heart size and shape. The size and thickness and movement of your heart's walls. Heart muscle function and strength. Heart valve function or if you have stenosis. Stenosis is when the heart valves are too narrow. If blood is flowing backward through the heart valves (regurgitation). A tumor or infectious growth around the heart valves. Areas of heart muscle that are not working well because of poor blood flow or injury from a heart attack. Aneurysm detection. An aneurysm is a weak or damaged part of an artery wall. The wall bulges out from the normal force of blood pumping through the body. Tell a health care provider about: Any allergies you have. All medicines you are taking, including vitamins, herbs, eye  drops, creams, and over-the-counter medicines. Any blood disorders you have. Any surgeries you have had. Any medical conditions you have. Whether you are pregnant or may be pregnant. What are the risks? Generally, this is a safe test. However, problems may occur,  including an allergic reaction to dye (contrast) that may be used during the test. What happens before the test? No specific preparation is needed. You may eat and drink normally. What happens during the test? You will take off your clothes from the waist up and put on a hospital gown. Electrodes or electrocardiogram (ECG)patches may be placed on your chest. The electrodes or patches are then connected to a device that monitors your heart rate and rhythm. You will lie down on a table for an ultrasound exam. A gel will be applied to your chest to help sound waves pass through your skin. A handheld device, called a transducer, will be pressed against your chest and moved over your heart. The transducer produces sound waves that travel to your heart and bounce back (or echo back) to the transducer. These sound waves will be captured in real-time and changed into images of your heart that can be viewed on a video monitor. The images will be recorded on a computer and reviewed by your health care provider. You may be asked to change positions or hold your breath for a short time. This makes it easier to get different views or better views of your heart. In some cases, you may receive contrast through an IV in one of your veins. This can improve the quality of the pictures from your heart. The procedure may vary among health care providers and hospitals.   What can I expect after the test? You may return to your normal, everyday life, including diet, activities, and medicines, unless your health care provider tells you not to do that. Follow these instructions at home: It is up to you to get the results of your test. Ask your health care provider, or the department that is doing the test, when your results will be ready. Keep all follow-up visits. This is important. Summary An echocardiogram is a test that uses sound waves (ultrasound) to produce images of the heart. Images from an echocardiogram can  provide important information about the size and shape of your heart, heart muscle function, heart valve function, and other possible heart problems. You do not need to do anything to prepare before this test. You may eat and drink normally. After the echocardiogram is completed, you may return to your normal, everyday life, unless your health care provider tells you not to do that. This information is not intended to replace advice given to you by your health care provider. Make sure you discuss any questions you have with your health care provider. Document Revised: 10/24/2019 Document Reviewed: 10/24/2019 Elsevier Patient Education  2021 Elsevier Inc.   Important Information About Sugar

## 2024-03-24 NOTE — Assessment & Plan Note (Signed)
 Appears more related to her anemia. She however has baseline shortness of breath on exertion chronically.  Other differential for her shortness of breath includes deconditioning versus pulmonary causes versus diastolic CHF versus tachycardia with underlying atrial fibrillation.  Continue management of anemia as per PCP and hematology and GI service. Anticipate she will require further evaluation for GI causes of anemia given her history of gastritis and esophagitis.  She has a visit coming up with gastroenterologist on Monday. From cardiac standpoint okay to proceed with EGD or colonoscopy as needed. Okay to hold Eliquis  as needed.  Obtain Zio patch 14-day study to rule out any significant tacky or bradycardia arrhythmia contributing to her symptoms.  Will obtain transthoracic echocardiogram to rule out any significant cardiac structural and functional changes.  Advised her to keep salt intake down less than 2 g/day. Will add Lasix  20 mg to take every other day.  Return for follow-up tentatively in 1 month.

## 2024-03-27 ENCOUNTER — Other Ambulatory Visit (HOSPITAL_BASED_OUTPATIENT_CLINIC_OR_DEPARTMENT_OTHER): Payer: Self-pay | Admitting: Student

## 2024-03-27 ENCOUNTER — Ambulatory Visit: Admitting: Internal Medicine

## 2024-03-27 ENCOUNTER — Ambulatory Visit: Payer: Self-pay | Admitting: Internal Medicine

## 2024-03-27 ENCOUNTER — Telehealth: Payer: Self-pay

## 2024-03-27 ENCOUNTER — Other Ambulatory Visit: Payer: Self-pay | Admitting: Adult Health

## 2024-03-27 ENCOUNTER — Encounter: Payer: Self-pay | Admitting: Internal Medicine

## 2024-03-27 ENCOUNTER — Other Ambulatory Visit (INDEPENDENT_AMBULATORY_CARE_PROVIDER_SITE_OTHER)

## 2024-03-27 VITALS — BP 130/80 | HR 66 | Ht 65.0 in | Wt 187.0 lb

## 2024-03-27 DIAGNOSIS — K219 Gastro-esophageal reflux disease without esophagitis: Secondary | ICD-10-CM

## 2024-03-27 DIAGNOSIS — D509 Iron deficiency anemia, unspecified: Secondary | ICD-10-CM

## 2024-03-27 DIAGNOSIS — Z8719 Personal history of other diseases of the digestive system: Secondary | ICD-10-CM | POA: Diagnosis not present

## 2024-03-27 LAB — CBC
HCT: 32.2 % — ABNORMAL LOW (ref 36.0–46.0)
Hemoglobin: 10.1 g/dL — ABNORMAL LOW (ref 12.0–15.0)
MCHC: 31.2 g/dL (ref 30.0–36.0)
MCV: 83 fl (ref 78.0–100.0)
Platelets: 258 K/uL (ref 150.0–400.0)
RBC: 3.88 Mil/uL (ref 3.87–5.11)
RDW: 18.2 % — ABNORMAL HIGH (ref 11.5–15.5)
WBC: 7.3 K/uL (ref 4.0–10.5)

## 2024-03-27 NOTE — Telephone Encounter (Signed)
 Jacob Rothfuss PA-C  This patient is having a procedure on 04-04-24 and we are looking for clearance for this patient to hold her Eliquis  2 days prior to her procedure. Please state if patient if cleared or not to hold her Eliquis  2 days prior. Thank you for your prompt response

## 2024-03-27 NOTE — Patient Instructions (Signed)
 _______________________________________________________  If your blood pressure at your visit was 140/90 or greater, please contact your primary care physician to follow up on this.  _______________________________________________________  If you are age 82 or older, your body mass index should be between 23-30. Your Body mass index is 31.12 kg/m. If this is out of the aforementioned range listed, please consider follow up with your Primary Care Provider.  If you are age 74 or younger, your body mass index should be between 19-25. Your Body mass index is 31.12 kg/m. If this is out of the aformentioned range listed, please consider follow up with your Primary Care Provider.   ________________________________________________________  The [facility] providers would like to encourage you to use MYCHART to communicate with providers for non-urgent requests or questions.  Due to long hold times on the telephone, sending your provider a message by [facility] may be a faster and more efficient way to get a response.  Please allow 48 business hours for a response.  Please remember that this is for non-urgent requests.  _______________________________________________________  [facility] is using a team-based approach to care.  Your team is made up of your doctor and two to three APPS. Our APPS (Nurse Practitioners and Physician Assistants) work with your physician to ensure care continuity for you. They are fully qualified to address your health concerns and develop a treatment plan. They communicate directly with your gastroenterologist to care for you. Seeing the Advanced Practice Practitioners on your physician's team can help you by facilitating care more promptly, often allowing for earlier appointments, access to diagnostic testing, procedures, and other specialty referrals.    Your provider has requested that you go to the basement level for lab work before leaving today. Press B on the  elevator. The lab is located at the first door on the left as you exit the elevator.  You will be contacted by our office prior to your procedure for directions on holding your Eliquis .  If you do not hear from our office 1 week prior to your scheduled procedure, please call [phone] to discuss.  You have been scheduled for an endoscopy. Please follow written instructions given to you at your visit today.  If you use inhalers (even only as needed), please bring them with you on the day of your procedure.  If you take any of the following medications, they will need to be adjusted prior to your procedure:   DO NOT TAKE 7 DAYS PRIOR TO TEST- Trulicity (dulaglutide) Ozempic, Wegovy (semaglutide) Mounjaro, Zepbound (tirzepatide) Bydureon Bcise (exanatide extended release)  DO NOT TAKE 1 DAY PRIOR TO YOUR TEST Rybelsus (semaglutide) Adlyxin (lixisenatide) Victoza (liraglutide) Byetta (exanatide) ___________________________________________________________________________   It was a pleasure to see you today!  Thank you for trusting me with your gastrointestinal care!

## 2024-03-27 NOTE — Progress Notes (Signed)
 "  Chief Complaint: IDA  HPI : 84 year old female with history of PE on Eliquis , A-fib, GERD, hiatal hernia, vertigo,arthritis presents with IDA  She was previously admitted in 04/2022 with IDA with a drop in her Hb from 11.8 3 months ago to 7.2. Following that hospitalization, EGD/colon showed esophagitis and gastritis. Esophagitis was suspected to be the source of her anemia at that time.   Interval History: More recently, patient was noted to have a drop in her Hb from 13.9 to 8.3. She received 1 U pRBCs last week in hematology clinic and is scheduled to get iron  infusions this upcoming Saturday  Denies melena or hematochezia. She does feel a little sluggish, though this is better than it has been. She saw her cardiologist who put her on a Ziopatch. Denies ab pain, dysphagia, odynophagia, N&V. She was told to hold her Eliquis  for 1-2 days by her hematologist but then her cardiology told her to restart her blood thinner. She has been on Protonix  once daily for years.   Past Medical History:  Diagnosis Date   Anemia    Arthritis    Atrial fibrillation ([name]) 06/16/2017   Per EKG, patient declined anticoagulation citing cost     Benign neoplasm of sigmoid colon    CKD stage 3a, GFR 45-59 ml/min ([name]) 02/21/2024   Deafness    left ear per pt    Dyspnea    pt reports with exertion due to hip and knees    Dysrhythmia    afib    Essential hypertension    Gastroesophageal reflux disease with esophagitis    Per EGD 01/2018     - Cameron erosions leading to chronic blood loss explains iron  deficiency. She could also have chronic blood loss from esophagitis. Anticipate chronic blood loss and need for repeat iron  replacement from [facility] erosions.  - Protonix  (pantoprazole ) 40 mg PO BID for 1 month then 40 mg po qam long term.     History of bleeding ulcers    2019 received 3-4 units of blood    History of pulmonary embolism (01/2018) 02/04/2018   Hyperlipidemia, mixed    Iron  deficiency anemia  04/17/2022   Iron  deficiency anemia due to chronic blood loss 03/08/2019   Secondary to esophagitis, cameron's lesions per EGD 01/2018, anticipate chronic iron  replacement needs     Medication management 11/18/2013   Obesity (BMI 30.0-34.9) 02/06/2015   Pericardial effusion 02/04/2018   Prediabetes    Primary localized osteoarthritis of right hip 09/26/2019   Pulmonary embolism ([name])    hx of 2019    Senile purpura 07/20/2017   Spinal stenosis of lumbar region 04/20/2019   Symptomatic anemia 02/04/2018   Thrombophilia 04/20/2019   Varicose vein of leg 09/01/2018   Vertigo    Vitamin D  deficiency      Past Surgical History:  Procedure Laterality Date   BIOPSY  02/05/2018   Procedure: BIOPSY;  Surgeon: Aneita Gwendlyn DASEN, MD;  Location: [facility];  Service: Endoscopy;;   CATARACT EXTRACTION W/ INTRAOCULAR LENS  IMPLANT, BILATERAL  2009   CEREBRAL ANEURYSM REPAIR     COLONOSCOPY N/A 02/05/2018   Procedure: COLONOSCOPY;  Surgeon: Aneita Gwendlyn DASEN, MD;  [facility];  Service: Endoscopy;  Laterality: N/A;   ESOPHAGOGASTRODUODENOSCOPY N/A 02/05/2018   Procedure: ESOPHAGOGASTRODUODENOSCOPY (EGD);  Surgeon: Aneita Gwendlyn DASEN, MD;  Location: [facility];  Service: Endoscopy;  Laterality: N/A;   FOOT SURGERY  1993   POLYPECTOMY  02/05/2018   Procedure: POLYPECTOMY;  Surgeon: Aneita Gwendlyn DASEN, MD;  Location: [facility];  Service: Endoscopy;;   TOTAL HIP ARTHROPLASTY Right 10/17/2019   Procedure: TOTAL HIP ARTHROPLASTY ANTERIOR APPROACH;  Surgeon: Beverley Evalene BIRCH, MD;  [facility];  Service: Orthopedics;  Laterality: Right;   TUBAL LIGATION     Family History  Problem Relation Age of Onset   Hypertension Mother    Diabetes Mother    Cancer Mother        Pancreatic   Cancer Father        Skin   Colon cancer Neg Hx    Stomach cancer Neg Hx    Esophageal cancer Neg Hx    Social History   Tobacco Use   Smoking status: Former    Current packs/day: 0.00    Types:  Cigarettes    Quit date: 08/07/1990    Years since quitting: 33.6    Passive exposure: Past   Smokeless tobacco: Never  Vaping Use   Vaping status: Never Used  Substance Use Topics   Alcohol use: No    Alcohol/week: 0.0 standard drinks of alcohol   Drug use: No   Current Outpatient Medications  Medication Sig Dispense Refill   acetaminophen  (TYLENOL ) 500 MG tablet Take 500-1,000 mg by mouth every 6 (six) hours as needed for mild pain or moderate pain.     apixaban  (ELIQUIS ) 5 MG TABS tablet Take 1 tablet (5 mg total) by mouth 2 (two) times daily. 180 tablet 3   Ascorbic Acid (VITAMIN C) 1000 MG tablet Take 1,000 mg by mouth daily.     Cholecalciferol (VITAMIN D3) 1000 units CAPS Take 1,000-4,000 Units by mouth daily.     Cyanocobalamin  (B-12) 1000 MCG CAPS Take by mouth daily.     diazepam  (VALIUM ) 5 MG tablet take ONE-HALF TO ONE tablet by MOUTH TWO TO THREE times daily as needed FOR SEVERE vertigo OR dizziness 30 tablet 0   ferrous sulfate  325 (65 FE) MG tablet Take 1 tablet (325 mg total) by mouth See admin instructions. Take 325 mg by mouth three times a week with food 30 tablet 3   furosemide  (LASIX ) 20 MG tablet Take 1 tablet (20 mg total) by mouth every other day. 90 tablet 3   gabapentin  (NEURONTIN ) 300 MG capsule Take  1 capsule  2 x day for pain. 180 capsule 3   latanoprost (XALATAN) 0.005 % ophthalmic solution Place 1 drop into both eyes at bedtime.     meclizine  (ANTIVERT ) 25 MG tablet TAKE ONE-HALF TO ONE TABLET BY MOUTH UP TO THREE TIMES DAILY FOR MOTION SICKNESS/DIZZINESSTAKE ONE-HALF TO ONE TABLET BY MOUTH UP TO THREE TIMES DAILY FOR MOTION SICKNESS/DIZZINESS 30 tablet 5   metoprolol  tartrate (LOPRESSOR ) 25 MG tablet Take 1 tablet (25 mg total) by mouth daily. 90 tablet 0   TURMERIC PO Take 1 capsule by mouth daily.     pantoprazole  (PROTONIX ) 40 MG tablet Take 1 tablet (40 mg total) by mouth 2 (two) times daily before a meal. 180 tablet 1   No current  facility-administered medications for this visit.   No Known Allergies   Review of Systems: All systems reviewed and negative except where noted in HPI.   Physical Exam: BP 130/80   Pulse 66   Ht 5' 5 (1.651 m)   Wt 187 lb (84.8 kg)   SpO2 100%   BMI 31.12 kg/m  Constitutional: Pleasant,well-developed, female in no acute distress. HEENT: Normocephalic and atraumatic. Conjunctivae are normal. No scleral icterus. Cardiovascular:  Irregularly irregular Pulmonary/chest: Effort normal and breath sounds normal. No wheezing, rales or rhonchi. Abdominal: Soft, nondistended, nontender. Bowel sounds active throughout. There are no masses palpable. No hepatomegaly. Extremities: Trace edema Neurological: Alert and oriented to person place and time. Skin: Skin is warm and dry. No rashes noted. Psychiatric: Normal mood and affect. Behavior is normal.  Labs 04/2021: Last CBC with low Hb of 7.5. CMP unremarkable. Ferritin low at 3.  Labs 02/2024: CBC with Hb of 13.9  Labs 03/2024: CBC with Hb of 8.3, followed by 8.6.  EGD 02/05/18:  Path: 2. Esophagus, biopsy - SQUAMOCOLUMNAR ESOPHAGEAL MUCOSA, ULCERATED AND INFLAMED WITH GRANULATION TISSUE AND NECROINFLAMMATORY DEBRIS. - NEGATIVE FOR INTESTINAL (GOBLET CELL) METAPLASIA. Microscopic Comment 2. Special PAS-F stain is negative for fungal elements. Immunohistochemistry for HSV and CMV is negative.   Colonoscopy 02/05/18:  Path: 1. Colon, polyp(s), sigmoid - HYPERPLASTIC POLYP.  EGD 04/23/22: - LA Grade A reflux esophagitis with bleeding. - Large hiatal hernia. - Gastritis. Biopsied. - Normal examined duodenum. Path: 1. Surgical [P], gastric - ANTRAL AND OXYNTIC MUCOSA WITH REACTIVE (CHEMICAL) GASTROPATHY - NEGATIVE FOR HELICOBACTER ORGANISMS ON H&E STAIN   Colonoscopy 04/23/22: - One 5 mm polyp in the cecum, removed with a cold snare. Resected and retrieved. - Diverticulosis in the sigmoid colon. - One 3 mm polyp in the sigmoid colon,  removed with a cold snare. Resected and retrieved. - Non- bleeding internal hemorrhoids. Path: SABRA Surgical [P], colon, cecum, polyp (1) - COLONIC MUCOSA WITH LYMPHOID AGGREGATE - NEGATIVE FOR MALIGNANCY 3. Surgical [P], colon, sigmoid, polyp (1) - TUBULAR ADENOMA - NEGATIVE FOR HIGH-GRADE DYSPLASIA OR MALIGNANCY  ASSESSMENT AND PLAN: IDA History of esophagitis History of hiatal hernia GERD PE and A-fib on Eliquis  Patient had a recent drop in her Hb from 13.9 to 8.3. She was symptomatic but this has gotten better after blood transfusion. Patient's hematology team messaged me to request that we recheck her blood counts again today. She is scheduled to receive iron  infusions starting this upcoming Saturday. Her prior GI evaluations have shown concern for bleeding due to esophagitis and Cameron erosions. Will plan for another EGD for evaluation. In the meantime will increase her pantoprazole  to BID to try to prevent futher blood loss. - Check CBC - Iron  infusions per hematology - Increase pantoprazole  from 40 mg every day to BID - EGD [facility]. Will need Eliquis  held for 2 days before the procedure.  Lori [name] Kidney, MD  I spent 35 minutes of time, including in depth chart review, independent review of results as outlined above, communicating results with the patient directly, face-to-face time with the patient, coordinating care, ordering studies and medications as appropriate, and documentation.  "

## 2024-03-28 NOTE — Addendum Note (Signed)
 Addended by: KATHIE LYLE BRAVO on: 03/28/2024 08:38 AM   Modules accepted: Orders

## 2024-03-28 NOTE — Telephone Encounter (Signed)
 Patient made aware I have nothing to do with her infusion

## 2024-03-28 NOTE — Telephone Encounter (Signed)
 Patient made aware and voiced understanding.

## 2024-03-31 MED FILL — Iron Sucrose Inj 20 MG/ML (Fe Equiv): Qty: 270 | Status: AC

## 2024-04-01 ENCOUNTER — Inpatient Hospital Stay

## 2024-04-01 VITALS — BP 145/87 | HR 85 | Temp 97.2°F | Resp 17

## 2024-04-01 DIAGNOSIS — D500 Iron deficiency anemia secondary to blood loss (chronic): Secondary | ICD-10-CM

## 2024-04-01 DIAGNOSIS — D508 Other iron deficiency anemias: Secondary | ICD-10-CM | POA: Diagnosis not present

## 2024-04-01 MED ORDER — IRON SUCROSE 400 MG IVPB - SIMPLE MED
400.0000 mg | Freq: Once | Status: AC
  Administered 2024-04-01: 400 mg via INTRAVENOUS
  Filled 2024-04-01: qty 400

## 2024-04-04 ENCOUNTER — Ambulatory Visit: Admitting: Internal Medicine

## 2024-04-04 ENCOUNTER — Encounter: Payer: Self-pay | Admitting: Internal Medicine

## 2024-04-04 VITALS — BP 137/73 | HR 98 | Temp 97.5°F | Resp 23 | Ht 65.0 in | Wt 187.0 lb

## 2024-04-04 DIAGNOSIS — D509 Iron deficiency anemia, unspecified: Secondary | ICD-10-CM

## 2024-04-04 DIAGNOSIS — Z8719 Personal history of other diseases of the digestive system: Secondary | ICD-10-CM

## 2024-04-04 DIAGNOSIS — K219 Gastro-esophageal reflux disease without esophagitis: Secondary | ICD-10-CM | POA: Diagnosis not present

## 2024-04-04 DIAGNOSIS — K449 Diaphragmatic hernia without obstruction or gangrene: Secondary | ICD-10-CM

## 2024-04-04 MED ORDER — SODIUM CHLORIDE 0.9 % IV SOLN: 500.0000 mL | Freq: Once | INTRAVENOUS | Status: AC

## 2024-04-04 NOTE — Progress Notes (Signed)
 "   GASTROENTEROLOGY PROCEDURE H&P NOTE   Primary Care Physician: Iven Lang DASEN, PA-C    Reason for Procedure:   IDA, history of esophagitis, GERD  Plan:    EGD  Patient is appropriate for endoscopic procedure(s) in the ambulatory ([facility]) setting.  The nature of the procedure, as well as the risks, benefits, and alternatives were carefully and thoroughly reviewed with the patient. Ample time for discussion and questions allowed. The patient understood, was satisfied, and agreed to proceed.     HPI: Lori [name] is a 84 y.o. female who presents for EGD for evaluation of IDA, history of esophagitis, GERD .  Patient was most recently seen in the Gastroenterology Clinic on 03/28/23.  No interval change in medical history since that appointment. Please refer to that note for full details regarding GI history and clinical presentation.   Past Medical History:  Diagnosis Date   Anemia    Arthritis    Atrial fibrillation ([name]) 06/16/2017   Per EKG, patient declined anticoagulation citing cost     Benign neoplasm of sigmoid colon    CKD stage 3a, GFR 45-59 ml/min ([name]) 02/21/2024   Deafness    left ear per pt    Dyspnea    pt reports with exertion due to hip and knees    Dysrhythmia    afib    Essential hypertension    Gastroesophageal reflux disease with esophagitis    Per EGD 01/2018     - Cameron erosions leading to chronic blood loss explains iron  deficiency. She could also have chronic blood loss from esophagitis. Anticipate chronic blood loss and need for repeat iron  replacement from [facility] erosions.  - Protonix  (pantoprazole ) 40 mg PO BID for 1 month then 40 mg po qam long term.     History of bleeding ulcers    2019 received 3-4 units of blood    History of pulmonary embolism (01/2018) 02/04/2018   Hyperlipidemia, mixed    Iron  deficiency anemia 04/17/2022   Iron  deficiency anemia due to chronic blood loss 03/08/2019   Secondary to esophagitis, cameron's lesions per EGD  01/2018, anticipate chronic iron  replacement needs     Medication management 11/18/2013   Obesity (BMI 30.0-34.9) 02/06/2015   Pericardial effusion 02/04/2018   Prediabetes    Primary localized osteoarthritis of right hip 09/26/2019   Pulmonary embolism ([name])    hx of 2019    Senile purpura 07/20/2017   Spinal stenosis of lumbar region 04/20/2019   Symptomatic anemia 02/04/2018   Thrombophilia 04/20/2019   Varicose vein of leg 09/01/2018   Vertigo    Vitamin D  deficiency     Past Surgical History:  Procedure Laterality Date   BIOPSY  02/05/2018   Procedure: BIOPSY;  Surgeon: Aneita Gwendlyn DASEN, MD;  [facility];  Service: Endoscopy;;   CATARACT EXTRACTION W/ INTRAOCULAR LENS  IMPLANT, BILATERAL  2009   CEREBRAL ANEURYSM REPAIR     COLONOSCOPY N/A 02/05/2018   Procedure: COLONOSCOPY;  Surgeon: Aneita Gwendlyn DASEN, MD;  [facility];  Service: Endoscopy;  Laterality: N/A;   ESOPHAGOGASTRODUODENOSCOPY N/A 02/05/2018   Procedure: ESOPHAGOGASTRODUODENOSCOPY (EGD);  Surgeon: Aneita Gwendlyn DASEN, MD;  Location: [facility];  Service: Endoscopy;  Laterality: N/A;   FOOT SURGERY  1993   POLYPECTOMY  02/05/2018   Procedure: POLYPECTOMY;  Surgeon: Aneita Gwendlyn DASEN, MD;  [facility];  Service: Endoscopy;;   TOTAL HIP ARTHROPLASTY Right 10/17/2019   Procedure: TOTAL HIP ARTHROPLASTY ANTERIOR APPROACH;  Surgeon: Beverley,  Evalene BIRCH, MD;  Location: [facility];  Service: Orthopedics;  Laterality: Right;   TUBAL LIGATION      Prior to Admission medications  Medication Sig Start Date End Date Taking? Authorizing Provider  acetaminophen  (TYLENOL ) 500 MG tablet Take 500-1,000 mg by mouth every 6 (six) hours as needed for mild pain or moderate pain.    [provider]  apixaban  (ELIQUIS ) 5 MG TABS tablet Take 1 tablet (5 mg total) by mouth 2 (two) times daily. 08/05/23   Rothfuss, Jacob T, PA-C  Ascorbic Acid (VITAMIN C) 1000 MG tablet Take 1,000 mg by mouth daily.     [provider]  Cholecalciferol (VITAMIN D3) 1000 units CAPS Take 1,000-4,000 Units by mouth daily.    [provider]  Cyanocobalamin  (B-12) 1000 MCG CAPS Take by mouth daily.    [provider]  diazepam  (VALIUM ) 5 MG tablet take ONE-HALF TO ONE tablet by MOUTH TWO TO THREE times daily as needed FOR SEVERE vertigo OR dizziness 03/13/24   Rothfuss, Jacob T, PA-C  ferrous sulfate  325 (65 FE) MG tablet Take 1 tablet (325 mg total) by mouth See admin instructions. Take 325 mg by mouth three times a week with food 04/18/22   Federico Rosario BROCKS, MD  furosemide  (LASIX ) 20 MG tablet Take 1 tablet (20 mg total) by mouth every other day. 03/24/24 06/22/24  Madireddy, Alean SAUNDERS, MD  gabapentin  (NEURONTIN ) 300 MG capsule Take  1 capsule  2 x day for pain. 02/21/24   Rothfuss, Jacob T, PA-C  latanoprost (XALATAN) 0.005 % ophthalmic solution Place 1 drop into both eyes at bedtime. 10/27/23   [provider]  meclizine  (ANTIVERT ) 25 MG tablet TAKE ONE-HALF TO ONE TABLET BY MOUTH UP TO THREE TIMES DAILY FOR MOTION SICKNESS/DIZZINESSTAKE ONE-HALF TO ONE TABLET BY MOUTH UP TO THREE TIMES DAILY FOR MOTION SICKNESS/DIZZINESS 05/17/23   Rothfuss, Jacob T, PA-C  metoprolol  tartrate (LOPRESSOR ) 25 MG tablet Take 1 tablet (25 mg total) by mouth daily. 03/23/24   Rothfuss, Jacob T, PA-C  pantoprazole  (PROTONIX ) 40 MG tablet Take 1 tablet (40 mg total) by mouth 2 (two) times daily before a meal. 03/27/24   Rothfuss, Jacob T, PA-C  TURMERIC PO Take 1 capsule by mouth daily.    [provider]    Current Outpatient Medications  Medication Sig Dispense Refill   acetaminophen  (TYLENOL ) 500 MG tablet Take 500-1,000 mg by mouth every 6 (six) hours as needed for mild pain or moderate pain.     Ascorbic Acid (VITAMIN C) 1000 MG tablet Take 1,000 mg by mouth daily.     Cholecalciferol (VITAMIN D3) 1000 units CAPS Take 1,000-4,000 Units by mouth daily.     Cyanocobalamin  (B-12) 1000 MCG CAPS Take by  mouth daily.     ferrous sulfate  325 (65 FE) MG tablet Take 1 tablet (325 mg total) by mouth See admin instructions. Take 325 mg by mouth three times a week with food 30 tablet 3   gabapentin  (NEURONTIN ) 300 MG capsule Take  1 capsule  2 x day for pain. 180 capsule 3   latanoprost (XALATAN) 0.005 % ophthalmic solution Place 1 drop into both eyes at bedtime.     meclizine  (ANTIVERT ) 25 MG tablet TAKE ONE-HALF TO ONE TABLET BY MOUTH UP TO THREE TIMES DAILY FOR MOTION SICKNESS/DIZZINESSTAKE ONE-HALF TO ONE TABLET BY MOUTH UP TO THREE TIMES DAILY FOR MOTION SICKNESS/DIZZINESS 30 tablet 5   metoprolol  tartrate (LOPRESSOR ) 25 MG tablet Take 1 tablet (25 mg  total) by mouth daily. 90 tablet 0   pantoprazole  (PROTONIX ) 40 MG tablet Take 1 tablet (40 mg total) by mouth 2 (two) times daily before a meal. 180 tablet 1   apixaban  (ELIQUIS ) 5 MG TABS tablet Take 1 tablet (5 mg total) by mouth 2 (two) times daily. 180 tablet 3   diazepam  (VALIUM ) 5 MG tablet take ONE-HALF TO ONE tablet by MOUTH TWO TO THREE times daily as needed FOR SEVERE vertigo OR dizziness 30 tablet 0   furosemide  (LASIX ) 20 MG tablet Take 1 tablet (20 mg total) by mouth every other day. (Patient not taking: Reported on 04/04/2024) 90 tablet 3   TURMERIC PO Take 1 capsule by mouth daily.     Current Facility-Administered Medications  Medication Dose Route Frequency Provider Last Rate Last Admin   0.9 %  sodium chloride  infusion  500 mL Intravenous Once Federico Rosario BROCKS, MD        Allergies as of 04/04/2024   (No Known Allergies)    Family History  Problem Relation Age of Onset   Hypertension Mother    Diabetes Mother    Cancer Mother        Pancreatic   Cancer Father        Skin   Colon cancer Neg Hx    Stomach cancer Neg Hx    Esophageal cancer Neg Hx     Social History   Socioeconomic History   Marital status: Divorced    Spouse name: Not on file   Number of children: 1   Years of education: 12   Highest education level:  Not on file  Occupational History   Occupation: retired  Tobacco Use   Smoking status: Former    Current packs/day: 0.00    Types: Cigarettes    Quit date: 08/07/1990    Years since quitting: 33.6    Passive exposure: Past   Smokeless tobacco: Never  Vaping Use   Vaping status: Never Used  Substance and Sexual Activity   Alcohol use: No    Alcohol/week: 0.0 standard drinks of alcohol   Drug use: No   Sexual activity: Not on file  Other Topics Concern   Not on file  Social History Narrative   Not on file   Social Drivers of Health   Tobacco Use: Medium Risk (04/04/2024)   Patient History    Smoking Tobacco Use: Former    Smokeless Tobacco Use: Never    Passive Exposure: Past  Physicist, Medical Strain: Low Risk (02/14/2024)   Overall Financial Resource Strain (CARDIA)    Difficulty of Paying Living Expenses: Not hard at all  Food Insecurity: No Food Insecurity (02/14/2024)   Epic    Worried About Radiation Protection Practitioner of Food in the Last Year: Never true    Ran Out of Food in the Last Year: Never true  Transportation Needs: No Transportation Needs (02/14/2024)   Epic    Lack of Transportation (Medical): No    Lack of Transportation (Non-Medical): No  Physical Activity: Inactive (02/14/2024)   Exercise Vital Sign    Days of Exercise per Week: 0 days    Minutes of Exercise per Session: 0 min  Stress: No Stress Concern Present (02/14/2024)   [employer] of Occupational Health - Occupational Stress Questionnaire    Feeling of Stress: Not at all  Social Connections: Moderately Integrated (02/14/2024)   Social Connection and Isolation Panel    Frequency of Communication with Friends and Family: Three times a week  Frequency of Social Gatherings with Friends and Family: Once a week    Attends Religious Services: More than 4 times per year    Active Member of [employer] or Organizations: No    Attends Engineer, Structural: 1 to 4 times per year    Marital Status: Divorced   Recent Concern: Social Connections - Socially Isolated (12/15/2023)   Social Connection and Isolation Panel    Frequency of Communication with Friends and Family: More than three times a week    Frequency of Social Gatherings with Friends and Family: More than three times a week    Attends Religious Services: Never    Database Administrator or Organizations: No    Attends Banker Meetings: Never    Marital Status: Divorced  Catering Manager Violence: Not At Risk (02/14/2024)   Epic    Fear of Current or Ex-Partner: No    Emotionally Abused: No    Physically Abused: No    Sexually Abused: No  Depression (PHQ2-9): Low Risk (02/21/2024)   Depression (PHQ2-9)    PHQ-2 Score: 0  Alcohol Screen: Low Risk (02/14/2024)   Alcohol Screen    Last Alcohol Screening Score (AUDIT): 0  Housing: Unknown (02/14/2024)   Epic    Unable to Pay for Housing in the Last Year: No    Number of Times Moved in the Last Year: Not on file    Homeless in the Last Year: No  Utilities: Not At Risk (02/14/2024)   Epic    Threatened with loss of utilities: No  Health Literacy: Adequate Health Literacy (02/14/2024)   B1300 Health Literacy    Frequency of need for help with medical instructions: Never    Physical Exam: Vital signs in last 24 hours: BP (!) 158/81   Pulse (!) 106   Temp (!) 97.5 F (36.4 C) (Temporal)   Ht 5' 5 (1.651 m)   Wt 187 lb (84.8 kg)   SpO2 98%   BMI 31.12 kg/m  GEN: NAD EYE: Sclerae anicteric ENT: MMM CV: Non-tachycardic Pulm: No increased WOB GI: Soft NEURO:  Alert & Oriented   Estefana Kidney, MD [facility]   04/04/2024 2:11 PM  "

## 2024-04-04 NOTE — Op Note (Signed)
 [facility] Patient Name: Lori [name] Procedure Date: 04/04/2024 2:09 PM MRN: [id number] Endoscopist: Rosario Estefana Kidney , , [id number] Age: 84 Referring MD:  Date of Birth: 01-14-1941 Gender: Female Account #: [id number] Procedure:                Upper GI endoscopy Indications:              Iron  deficiency anemia, Heartburn, Follow-up of                            esophagitis Medicines:                Monitored Anesthesia Care Procedure:                Pre-Anesthesia Assessment:                           - Prior to the procedure, a History and Physical                            was performed, and patient medications and                            allergies were reviewed. The patient's tolerance of                            previous anesthesia was also reviewed. The risks                            and benefits of the procedure and the sedation                            options and risks were discussed with the patient.                            All questions were answered, and informed consent                            was obtained. Prior Anticoagulants: The patient has                            taken Eliquis  (apixaban ), last dose was 3 days                            prior to procedure. ASA Grade Assessment: III - A                            patient with severe systemic disease. After                            reviewing the risks and benefits, the patient was                            deemed in satisfactory condition to undergo the  procedure.                           After obtaining informed consent, the endoscope was                            passed under direct vision. Throughout the                            procedure, the patient's blood pressure, pulse, and                            oxygen saturations were monitored continuously. The                            Olympus Scope [id number] was introduced through the                             mouth, and advanced to the second part of duodenum.                            The upper GI endoscopy was accomplished without                            difficulty. The patient tolerated the procedure                            well. Scope In: Scope Out: Findings:                 The examined esophagus was normal.                           A large hiatal hernia was present.                           The examined duodenum was normal. Complications:            No immediate complications. Estimated Blood Loss:     Estimated blood loss: none. Impression:               - Normal esophagus.                           - Large hiatal hernia.                           - Normal examined duodenum.                           - No specimens collected. Recommendation:           - Discharge patient to home (with escort).                           - No obvious source of anemia was found on today's  exam, though it is possible that your bleeding has                            now stopped, possibly due to use of pantoprazole                             therapy.                           - Okay to restart your Eliquis  today.                           - To visualize the small bowel, perform video                            capsule endoscopy at appointment to be scheduled.                            My staff will be in touch with you about this.                           - The findings and recommendations were discussed                            with the patient. Dr Estefana Federico Rosario Estefana Federico,  04/04/2024 2:34:36 PM

## 2024-04-04 NOTE — Progress Notes (Signed)
 Sedate, gd SR, tolerated procedure well, VSS, report to RN

## 2024-04-04 NOTE — Patient Instructions (Signed)
-  You can restart your Eliquis  today  YOU HAD AN ENDOSCOPIC PROCEDURE TODAY AT THE [facility]:   Refer to the procedure report that was given to you for any specific questions about what was found during the examination.  If the procedure report does not answer your questions, please call your gastroenterologist to clarify.  If you requested that your care partner not be given the details of your procedure findings, then the procedure report has been included in a sealed envelope for you to review at your convenience later.  YOU SHOULD EXPECT: Some feelings of bloating in the abdomen. Passage of more gas than usual.  Walking can help get rid of the air that was put into your GI tract during the procedure and reduce the bloating. If you had a lower endoscopy (such as a colonoscopy or flexible sigmoidoscopy) you may notice spotting of blood in your stool or on the toilet paper. If you underwent a bowel prep for your procedure, you may not have a normal bowel movement for a few days.  Please Note:  You might notice some irritation and congestion in your nose or some drainage.  This is from the oxygen used during your procedure.  There is no need for concern and it should clear up in a day or so.  SYMPTOMS TO REPORT IMMEDIATELY:  Following upper endoscopy (EGD)  Vomiting of blood or coffee ground material  New chest pain or pain under the shoulder blades  Painful or persistently difficult swallowing  New shortness of breath  Fever of 100F or higher  Black, tarry-looking stools  For urgent or emergent issues, a gastroenterologist can be reached at any hour by calling [phone]. Do not use MyChart messaging for urgent concerns.    DIET:  We do recommend a small meal at first, but then you may proceed to your regular diet.  Drink plenty of fluids but you should avoid alcoholic beverages for 24 hours.  ACTIVITY:  You should plan to take it easy for the rest of today and you should  NOT DRIVE or use heavy machinery until tomorrow (because of the sedation medicines used during the test).    FOLLOW UP: Our staff will call the number listed on your records the next business day following your procedure.  We will call around 7:15- 8:00 am to check on you and address any questions or concerns that you may have regarding the information given to you following your procedure. If we do not reach you, we will leave a message.     If any biopsies were taken you will be contacted by phone or by letter within the next 1-3 weeks.  Please call us  at [phone] if you have not heard about the biopsies in 3 weeks.    SIGNATURES/CONFIDENTIALITY: You and/or your care partner have signed paperwork which will be entered into your electronic medical record.  These signatures attest to the fact that that the information above on your After Visit Summary has been reviewed and is understood.  Full responsibility of the confidentiality of this discharge information lies with you and/or your care-partner.

## 2024-04-05 ENCOUNTER — Telehealth: Payer: Self-pay | Admitting: *Deleted

## 2024-04-05 ENCOUNTER — Other Ambulatory Visit: Payer: Self-pay

## 2024-04-05 ENCOUNTER — Telehealth: Payer: Self-pay

## 2024-04-05 DIAGNOSIS — D509 Iron deficiency anemia, unspecified: Secondary | ICD-10-CM

## 2024-04-05 NOTE — Telephone Encounter (Signed)
-----   Message from Rosario Kidney, MD sent at 04/04/2024  2:34 PM EST ----- Pod B triage, let's get this patient scheduled for a VCE please. Just did an EGD on her today that was negative for a source of bleeding. Thanks.

## 2024-04-05 NOTE — Telephone Encounter (Signed)
" °  Follow up Call-     04/04/2024    1:10 PM 04/23/2022    9:05 AM  Call back number  Post procedure Call Back phone  # [phone]  Permission to leave phone message Yes Yes     Patient questions:  Do you have a fever, pain , or abdominal swelling? No. Pain Score  0 *  Have you tolerated food without any problems? Yes.    Have you been able to return to your normal activities? Yes.    Do you have any questions about your discharge instructions: Diet   No. Medications  No. Follow up visit  No.  Do you have questions or concerns about your Care? No.  Actions: * If pain score is 4 or above: No action needed, pain <4.   "

## 2024-04-05 NOTE — Telephone Encounter (Signed)
 Pt was made aware of Dr. Federico recommendations: Pt was ordered and scheduled for a Capsule Video Endoscopy. On 25/2026  Pt made aware Ambulatory referral to GI was placed in Epic. Pt made aware  Prep instructions were sent to pt via my chart and via mail as pt requested. Pt made aware.  Pt verbalized understanding with all questions answered.

## 2024-04-06 NOTE — Telephone Encounter (Signed)
 Per [place] with scheduling, patient called to reschedule capsule to 2/12. Updated instructions sent to pt in mychart.

## 2024-04-07 ENCOUNTER — Inpatient Hospital Stay

## 2024-04-07 ENCOUNTER — Telehealth: Payer: Self-pay | Admitting: Adult Health

## 2024-04-07 VITALS — BP 146/90 | HR 95 | Temp 98.6°F | Resp 16

## 2024-04-07 DIAGNOSIS — D500 Iron deficiency anemia secondary to blood loss (chronic): Secondary | ICD-10-CM

## 2024-04-07 DIAGNOSIS — D508 Other iron deficiency anemias: Secondary | ICD-10-CM | POA: Diagnosis not present

## 2024-04-07 MED ORDER — IRON SUCROSE 400 MG IVPB - SIMPLE MED
400.0000 mg | Freq: Once | Status: AC
  Administered 2024-04-07: 400 mg via INTRAVENOUS
  Filled 2024-04-07: qty 400

## 2024-04-07 MED ORDER — SODIUM CHLORIDE 0.9 % IV SOLN: INTRAVENOUS | Status: DC

## 2024-04-07 NOTE — Progress Notes (Signed)
 Pt observed for 20 min post Venofer  400mg . Tolerated well. No adverse s/s. VSS. AVS reviewed. Pt d/c to lobby via wheelchair with NT.

## 2024-04-07 NOTE — Patient Instructions (Signed)
Iron Sucrose Injection What is this medication? IRON SUCROSE (EYE ern SOO krose) treats low levels of iron (iron deficiency anemia) in people with kidney disease. Iron is a mineral that plays an important role in making red blood cells, which carry oxygen from your lungs to the rest of your body. This medicine may be used for other purposes; ask your health care provider or pharmacist if you have questions. COMMON BRAND NAME(S): Venofer What should I tell my care team before I take this medication? They need to know if you have any of these conditions: Anemia not caused by low iron levels Heart disease High levels of iron in the blood Kidney disease Liver disease An unusual or allergic reaction to iron, other medications, foods, dyes, or preservatives Pregnant or trying to get pregnant Breastfeeding How should I use this medication? This medication is for infusion into a vein. It is given in a hospital or clinic setting. Talk to your care team about the use of this medication in children. While this medication may be prescribed for children as young as 2 years for selected conditions, precautions do apply. Overdosage: If you think you have taken too much of this medicine contact a poison control center or emergency room at once. NOTE: This medicine is only for you. Do not share this medicine with others. What if I miss a dose? Keep appointments for follow-up doses. It is important not to miss your dose. Call your care team if you are unable to keep an appointment. What may interact with this medication? Do not take this medication with any of the following: Deferoxamine Dimercaprol Other iron products This medication may also interact with the following: Chloramphenicol Deferasirox This list may not describe all possible interactions. Give your health care provider a list of all the medicines, herbs, non-prescription drugs, or dietary supplements you use. Also tell them if you smoke,  drink alcohol, or use illegal drugs. Some items may interact with your medicine. What should I watch for while using this medication? Visit your care team regularly. Tell your care team if your symptoms do not start to get better or if they get worse. You may need blood work done while you are taking this medication. You may need to follow a special diet. Talk to your care team. Foods that contain iron include: whole grains/cereals, dried fruits, beans, or peas, leafy green vegetables, and organ meats (liver, kidney). What side effects may I notice from receiving this medication? Side effects that you should report to your care team as soon as possible: Allergic reactions--skin rash, itching, hives, swelling of the face, lips, tongue, or throat Low blood pressure--dizziness, feeling faint or lightheaded, blurry vision Shortness of breath Side effects that usually do not require medical attention (report to your care team if they continue or are bothersome): Flushing Headache Joint pain Muscle pain Nausea Pain, redness, or irritation at injection site This list may not describe all possible side effects. Call your doctor for medical advice about side effects. You may report side effects to FDA at 1-800-FDA-1088. Where should I keep my medication? This medication is given in a hospital or clinic and will not be stored at home. NOTE: This sheet is a summary. It may not cover all possible information. If you have questions about this medicine, talk to your doctor, pharmacist, or health care provider.  2023 Elsevier/Gold Standard (2020-06-13 00:00:00)  

## 2024-04-07 NOTE — Telephone Encounter (Signed)
 Patient stopped by 2nd floor & picked up capsule instructions.

## 2024-04-07 NOTE — Telephone Encounter (Signed)
 Left vm for pt about scheduled appt date and time. Encouraged to call back if need to reschedule

## 2024-04-08 ENCOUNTER — Inpatient Hospital Stay

## 2024-04-13 ENCOUNTER — Telehealth: Payer: Self-pay

## 2024-04-13 NOTE — Telephone Encounter (Signed)
 Called the patient and she stated that she had been in the hospital and was getting blood transfusions and having upper and lower GI procedures to identify where she was losing blood. She stated that wearing the heart monitor was just too much with everything else going on and she took the monitor off.

## 2024-04-13 NOTE — Telephone Encounter (Signed)
 Pt came in to let us  know she had to take her monitor off after 2-3 days. She said that she has a lot of other health issues going on and she couldn't deal with the monitor also.

## 2024-04-14 ENCOUNTER — Inpatient Hospital Stay

## 2024-04-14 DIAGNOSIS — D508 Other iron deficiency anemias: Secondary | ICD-10-CM | POA: Diagnosis not present

## 2024-04-14 DIAGNOSIS — D500 Iron deficiency anemia secondary to blood loss (chronic): Secondary | ICD-10-CM

## 2024-04-14 LAB — CBC WITH DIFFERENTIAL (CANCER CENTER ONLY)
Abs Immature Granulocytes: 0.04 10*3/uL (ref 0.00–0.07)
Basophils Absolute: 0.1 10*3/uL (ref 0.0–0.1)
Basophils Relative: 1 %
Eosinophils Absolute: 0.1 10*3/uL (ref 0.0–0.5)
Eosinophils Relative: 1 %
HCT: 36.7 % (ref 36.0–46.0)
Hemoglobin: 10.7 g/dL — ABNORMAL LOW (ref 12.0–15.0)
Immature Granulocytes: 1 %
Lymphocytes Relative: 13 %
Lymphs Abs: 0.9 10*3/uL (ref 0.7–4.0)
MCH: 26.1 pg (ref 26.0–34.0)
MCHC: 29.2 g/dL — ABNORMAL LOW (ref 30.0–36.0)
MCV: 89.5 fL (ref 80.0–100.0)
Monocytes Absolute: 0.7 10*3/uL (ref 0.1–1.0)
Monocytes Relative: 10 %
Neutro Abs: 5 10*3/uL (ref 1.7–7.7)
Neutrophils Relative %: 74 %
Platelet Count: 219 10*3/uL (ref 150–400)
RBC: 4.1 MIL/uL (ref 3.87–5.11)
RDW: 24.1 % — ABNORMAL HIGH (ref 11.5–15.5)
WBC Count: 6.7 10*3/uL (ref 4.0–10.5)
nRBC: 0 % (ref 0.0–0.2)

## 2024-04-14 LAB — CMP (CANCER CENTER ONLY)
ALT: 33 U/L (ref 0–44)
AST: 30 U/L (ref 15–41)
Albumin: 4.3 g/dL (ref 3.5–5.0)
Alkaline Phosphatase: 93 U/L (ref 38–126)
Anion gap: 10 (ref 5–15)
BUN: 27 mg/dL — ABNORMAL HIGH (ref 8–23)
CO2: 26 mmol/L (ref 22–32)
Calcium: 9.3 mg/dL (ref 8.9–10.3)
Chloride: 109 mmol/L (ref 98–111)
Creatinine: 0.94 mg/dL (ref 0.44–1.00)
GFR, Estimated: >60 mL/min
Glucose, Bld: 141 mg/dL — ABNORMAL HIGH (ref 70–99)
Potassium: 4.1 mmol/L (ref 3.5–5.1)
Sodium: 145 mmol/L (ref 135–145)
Total Bilirubin: 0.4 mg/dL (ref 0.0–1.2)
Total Protein: 6.1 g/dL — ABNORMAL LOW (ref 6.5–8.1)

## 2024-04-14 LAB — RETIC PANEL
Immature Retic Fract: 25.1 % — ABNORMAL HIGH (ref 2.3–15.9)
RBC.: 4 MIL/uL (ref 3.87–5.11)
Retic Count, Absolute: 207 10*3/uL — ABNORMAL HIGH (ref 19.0–186.0)
Retic Ct Pct: 5.2 % — ABNORMAL HIGH (ref 0.4–3.1)
Reticulocyte Hemoglobin: 25.2 pg — ABNORMAL LOW

## 2024-04-14 LAB — FERRITIN: Ferritin: 225 ng/mL (ref 11–307)

## 2024-04-14 LAB — IRON AND IRON BINDING CAPACITY (CC-WL,HP ONLY)
Iron: 33 ug/dL (ref 28–170)
Saturation Ratios: 9 % — ABNORMAL LOW (ref 10.4–31.8)
TIBC: 377 ug/dL (ref 250–450)
UIBC: 344 ug/dL

## 2024-04-14 LAB — VITAMIN B12: Vitamin B-12: 2826 pg/mL — ABNORMAL HIGH (ref 180–914)

## 2024-04-17 ENCOUNTER — Telehealth: Payer: Self-pay

## 2024-04-18 ENCOUNTER — Inpatient Hospital Stay: Attending: Adult Health

## 2024-04-18 ENCOUNTER — Ambulatory Visit: Payer: Self-pay

## 2024-04-20 ENCOUNTER — Ambulatory Visit

## 2024-04-20 ENCOUNTER — Encounter

## 2024-04-20 ENCOUNTER — Ambulatory Visit: Payer: Self-pay

## 2024-04-20 DIAGNOSIS — I482 Chronic atrial fibrillation, unspecified: Secondary | ICD-10-CM

## 2024-04-20 DIAGNOSIS — R0602 Shortness of breath: Secondary | ICD-10-CM

## 2024-04-20 LAB — ECHOCARDIOGRAM COMPLETE
AR max vel: 1.72 cm2
AV Area VTI: 1.63 cm2
AV Area mean vel: 1.7 cm2
AV Mean grad: 3.8 mmHg
AV Peak grad: 6.7 mmHg
Ao pk vel: 1.3 m/s
Area-P 1/2: 4.52 cm2
MV M vel: 2.45 m/s
MV Peak grad: 24 mmHg
MV VTI: 1.38 cm2
S' Lateral: 2.8 cm

## 2024-04-20 NOTE — Telephone Encounter (Signed)
 Patient is returning call

## 2024-04-21 ENCOUNTER — Telehealth: Payer: Self-pay | Admitting: Internal Medicine

## 2024-04-21 ENCOUNTER — Other Ambulatory Visit (HOSPITAL_BASED_OUTPATIENT_CLINIC_OR_DEPARTMENT_OTHER): Payer: Self-pay | Admitting: Family Medicine

## 2024-04-21 ENCOUNTER — Telehealth: Payer: Self-pay

## 2024-04-21 NOTE — Telephone Encounter (Signed)
 Attempted to reach patient, LVM. Patient to see cardiologist 2/9.  Advised in message she would need to have cardiology approval before proceeding with capsule endoscopy on 2/12. Requested return call from patient. Sent MyChart message.

## 2024-04-21 NOTE — Telephone Encounter (Signed)
" °  Per answering service: patient called stating that she received a call from Richard. Please call back "

## 2024-04-21 NOTE — Telephone Encounter (Signed)
 Patient informed of results.

## 2024-04-21 NOTE — Telephone Encounter (Signed)
Patient is requesting a call back with results.

## 2024-04-21 NOTE — Telephone Encounter (Signed)
 Patient would like to know if they should reschedule their appointment based on the notes left with the patients heart doctor. Please Advise, Thank you.

## 2024-04-24 ENCOUNTER — Ambulatory Visit

## 2024-04-27 ENCOUNTER — Encounter

## 2024-05-16 ENCOUNTER — Inpatient Hospital Stay: Attending: Adult Health

## 2024-05-16 ENCOUNTER — Inpatient Hospital Stay: Admitting: Adult Health

## 2024-05-23 ENCOUNTER — Ambulatory Visit (HOSPITAL_BASED_OUTPATIENT_CLINIC_OR_DEPARTMENT_OTHER): Admitting: Student

## 2024-08-25 ENCOUNTER — Encounter (INDEPENDENT_AMBULATORY_CARE_PROVIDER_SITE_OTHER): Payer: Self-pay

## 2025-02-13 ENCOUNTER — Inpatient Hospital Stay: Admitting: Adult Health

## 2025-02-13 ENCOUNTER — Inpatient Hospital Stay
